# Patient Record
Sex: Female | Born: 1965 | Race: White | Hispanic: No | Marital: Married | Smoking: Current every day smoker
Health system: Southern US, Community
[De-identification: ages and names within clinical notes are randomized; demographics above are authoritative.]

## PROBLEM LIST (undated history)

## (undated) DIAGNOSIS — M1990 Unspecified osteoarthritis, unspecified site: Secondary | ICD-10-CM

## (undated) DIAGNOSIS — K219 Gastro-esophageal reflux disease without esophagitis: Secondary | ICD-10-CM

## (undated) DIAGNOSIS — E119 Type 2 diabetes mellitus without complications: Secondary | ICD-10-CM

## (undated) DIAGNOSIS — F419 Anxiety disorder, unspecified: Secondary | ICD-10-CM

## (undated) DIAGNOSIS — M542 Cervicalgia: Secondary | ICD-10-CM

## (undated) DIAGNOSIS — S060X9A Concussion with loss of consciousness of unspecified duration, initial encounter: Secondary | ICD-10-CM

## (undated) DIAGNOSIS — E785 Hyperlipidemia, unspecified: Secondary | ICD-10-CM

## (undated) DIAGNOSIS — D649 Anemia, unspecified: Secondary | ICD-10-CM

## (undated) DIAGNOSIS — E039 Hypothyroidism, unspecified: Secondary | ICD-10-CM

## (undated) DIAGNOSIS — R569 Unspecified convulsions: Secondary | ICD-10-CM

## (undated) DIAGNOSIS — T8852XA Failed moderate sedation during procedure, initial encounter: Secondary | ICD-10-CM

## (undated) DIAGNOSIS — I10 Essential (primary) hypertension: Secondary | ICD-10-CM

## (undated) DIAGNOSIS — S069XAA Unspecified intracranial injury with loss of consciousness status unknown, initial encounter: Secondary | ICD-10-CM

## (undated) DIAGNOSIS — F0781 Postconcussional syndrome: Secondary | ICD-10-CM

## (undated) DIAGNOSIS — F329 Major depressive disorder, single episode, unspecified: Secondary | ICD-10-CM

## (undated) HISTORY — DX: Type 2 diabetes mellitus without complications: E11.9

## (undated) HISTORY — DX: Hyperlipidemia, unspecified: E78.5

## (undated) HISTORY — PX: APPENDECTOMY: SHX54

## (undated) HISTORY — DX: Concussion with loss of consciousness of unspecified duration, initial encounter: S06.0X9A

## (undated) HISTORY — DX: Failed moderate sedation during procedure, initial encounter: T88.52XA

## (undated) HISTORY — PX: ABDOMINAL HYSTERECTOMY: SHX81

## (undated) HISTORY — PX: CERVICAL DISC SURGERY: SHX588

## (undated) HISTORY — PX: CHOLECYSTECTOMY: SHX55

## (undated) HISTORY — DX: Cervicalgia: M54.2

## (undated) HISTORY — PX: TUBAL LIGATION: SHX77

## (undated) HISTORY — DX: Postconcussional syndrome: F07.81

## (undated) HISTORY — PX: BACK SURGERY: SHX140

---
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
## 1988-11-10 HISTORY — PX: KNEE SURGERY: SHX244

## 1997-12-11 HISTORY — PX: TRACHEOSTOMY: SUR1362

## 1999-08-12 DIAGNOSIS — S060X9A Concussion with loss of consciousness of unspecified duration, initial encounter: Secondary | ICD-10-CM

## 1999-08-12 DIAGNOSIS — S060XAA Concussion with loss of consciousness status unknown, initial encounter: Secondary | ICD-10-CM

## 1999-08-12 HISTORY — DX: Concussion with loss of consciousness status unknown, initial encounter: S06.0XAA

## 1999-08-12 HISTORY — DX: Concussion with loss of consciousness of unspecified duration, initial encounter: S06.0X9A

## 2003-01-21 ENCOUNTER — Encounter: Payer: Self-pay | Admitting: Family Medicine

## 2003-01-21 ENCOUNTER — Ambulatory Visit (HOSPITAL_COMMUNITY): Admission: RE | Admit: 2003-01-21 | Discharge: 2003-01-21 | Payer: Self-pay | Admitting: Family Medicine

## 2004-12-22 ENCOUNTER — Ambulatory Visit (HOSPITAL_COMMUNITY): Admission: RE | Admit: 2004-12-22 | Discharge: 2004-12-22 | Payer: Self-pay | Admitting: Family Medicine

## 2004-12-27 ENCOUNTER — Ambulatory Visit (HOSPITAL_COMMUNITY): Admission: RE | Admit: 2004-12-27 | Discharge: 2004-12-27 | Payer: Self-pay | Admitting: Family Medicine

## 2005-07-12 ENCOUNTER — Ambulatory Visit (HOSPITAL_COMMUNITY): Admission: RE | Admit: 2005-07-12 | Discharge: 2005-07-12 | Payer: Self-pay | Admitting: Family Medicine

## 2005-07-19 ENCOUNTER — Encounter (HOSPITAL_COMMUNITY): Admission: RE | Admit: 2005-07-19 | Discharge: 2005-08-18 | Payer: Self-pay | Admitting: Family Medicine

## 2006-10-26 ENCOUNTER — Ambulatory Visit (HOSPITAL_COMMUNITY): Payer: Self-pay | Admitting: Psychology

## 2006-11-12 ENCOUNTER — Ambulatory Visit (HOSPITAL_COMMUNITY): Payer: Self-pay | Admitting: Psychology

## 2009-01-22 ENCOUNTER — Ambulatory Visit (HOSPITAL_COMMUNITY): Admission: RE | Admit: 2009-01-22 | Discharge: 2009-01-22 | Payer: Self-pay | Admitting: Family Medicine

## 2010-01-07 ENCOUNTER — Ambulatory Visit (HOSPITAL_COMMUNITY): Admission: RE | Admit: 2010-01-07 | Discharge: 2010-01-07 | Payer: Self-pay | Admitting: Otolaryngology

## 2010-09-10 HISTORY — PX: CERVIX SURGERY: SHX593

## 2010-12-15 ENCOUNTER — Ambulatory Visit: Admit: 2010-12-15 | Payer: Self-pay | Admitting: Internal Medicine

## 2011-01-01 ENCOUNTER — Encounter: Payer: Self-pay | Admitting: Physical Medicine and Rehabilitation

## 2011-02-09 HISTORY — PX: CARDIOVASCULAR STRESS TEST: SHX262

## 2011-04-25 ENCOUNTER — Ambulatory Visit (INDEPENDENT_AMBULATORY_CARE_PROVIDER_SITE_OTHER): Payer: Self-pay | Admitting: Internal Medicine

## 2012-02-07 ENCOUNTER — Other Ambulatory Visit: Payer: Self-pay | Admitting: Family Medicine

## 2012-02-07 ENCOUNTER — Ambulatory Visit (HOSPITAL_COMMUNITY)
Admission: RE | Admit: 2012-02-07 | Discharge: 2012-02-07 | Disposition: A | Discharge status: Home / Self Care | Payer: BC Managed Care – PPO | Source: Ambulatory Visit | Attending: Family Medicine | Admitting: Family Medicine

## 2012-02-07 ENCOUNTER — Ambulatory Visit (HOSPITAL_COMMUNITY): Payer: BC Managed Care – PPO

## 2012-02-07 DIAGNOSIS — R05 Cough: Secondary | ICD-10-CM

## 2012-02-07 DIAGNOSIS — R059 Cough, unspecified: Secondary | ICD-10-CM

## 2012-02-07 DIAGNOSIS — J3489 Other specified disorders of nose and nasal sinuses: Secondary | ICD-10-CM | POA: Insufficient documentation

## 2012-09-04 ENCOUNTER — Ambulatory Visit (HOSPITAL_COMMUNITY)
Admission: RE | Admit: 2012-09-04 | Discharge: 2012-09-04 | Disposition: A | Discharge status: Home / Self Care | Payer: BC Managed Care – PPO | Source: Ambulatory Visit | Attending: Family Medicine | Admitting: Family Medicine

## 2012-09-04 ENCOUNTER — Other Ambulatory Visit: Payer: Self-pay | Admitting: Family Medicine

## 2012-09-04 DIAGNOSIS — M47817 Spondylosis without myelopathy or radiculopathy, lumbosacral region: Secondary | ICD-10-CM | POA: Insufficient documentation

## 2012-09-04 DIAGNOSIS — M549 Dorsalgia, unspecified: Secondary | ICD-10-CM | POA: Insufficient documentation

## 2012-09-04 DIAGNOSIS — M4120 Other idiopathic scoliosis, site unspecified: Secondary | ICD-10-CM | POA: Insufficient documentation

## 2012-12-11 HISTORY — PX: CARDIAC CATHETERIZATION: SHX172

## 2012-12-13 ENCOUNTER — Other Ambulatory Visit: Payer: Self-pay | Admitting: Family Medicine

## 2012-12-13 DIAGNOSIS — R109 Unspecified abdominal pain: Secondary | ICD-10-CM

## 2012-12-17 ENCOUNTER — Ambulatory Visit (HOSPITAL_COMMUNITY): Payer: BC Managed Care – PPO | Attending: Family Medicine

## 2013-01-10 ENCOUNTER — Ambulatory Visit (HOSPITAL_COMMUNITY): Admission: RE | Admit: 2013-01-10 | Payer: BC Managed Care – PPO | Source: Ambulatory Visit

## 2013-02-05 ENCOUNTER — Other Ambulatory Visit (HOSPITAL_COMMUNITY): Payer: Self-pay | Admitting: Family Medicine

## 2013-02-05 DIAGNOSIS — R102 Pelvic and perineal pain: Secondary | ICD-10-CM

## 2013-02-05 DIAGNOSIS — R109 Unspecified abdominal pain: Secondary | ICD-10-CM

## 2013-02-14 ENCOUNTER — Ambulatory Visit (HOSPITAL_COMMUNITY): Payer: BC Managed Care – PPO

## 2013-02-14 ENCOUNTER — Ambulatory Visit (HOSPITAL_COMMUNITY): Payer: BC Managed Care – PPO | Attending: Family Medicine

## 2013-02-15 ENCOUNTER — Encounter: Payer: Self-pay | Admitting: *Deleted

## 2013-02-22 ENCOUNTER — Encounter: Payer: Self-pay | Admitting: *Deleted

## 2013-02-26 ENCOUNTER — Ambulatory Visit (INDEPENDENT_AMBULATORY_CARE_PROVIDER_SITE_OTHER): Payer: BC Managed Care – PPO | Admitting: Family Medicine

## 2013-02-26 ENCOUNTER — Encounter: Payer: Self-pay | Admitting: Family Medicine

## 2013-02-26 ENCOUNTER — Other Ambulatory Visit: Payer: Self-pay | Admitting: Family Medicine

## 2013-02-26 VITALS — BP 98/66 | HR 80 | Wt 147.0 lb

## 2013-02-26 DIAGNOSIS — E039 Hypothyroidism, unspecified: Secondary | ICD-10-CM | POA: Insufficient documentation

## 2013-02-26 DIAGNOSIS — M542 Cervicalgia: Secondary | ICD-10-CM

## 2013-02-26 DIAGNOSIS — G8929 Other chronic pain: Secondary | ICD-10-CM | POA: Insufficient documentation

## 2013-02-26 DIAGNOSIS — F411 Generalized anxiety disorder: Secondary | ICD-10-CM | POA: Insufficient documentation

## 2013-02-26 DIAGNOSIS — R109 Unspecified abdominal pain: Secondary | ICD-10-CM

## 2013-02-26 DIAGNOSIS — E559 Vitamin D deficiency, unspecified: Secondary | ICD-10-CM

## 2013-02-26 NOTE — Progress Notes (Signed)
Patient comes in today overall she is hanging in there she finds herself very frustrated she has a lot of neck pain and discomfort pain that radiates into the shoulder and into the arms to some degree she finds it very difficult for her to do any type ofactivity on a regular basis. In addition to this she finds any significant length of standing or sitting or squatting turning bending pulling or lifting causes significant troubles.she does see pain management specialist may work with her extensively on her pain. They recently put her on methadone. She also uses Xanax for significant anxiety issues. Currently she takes Xanax twice daily it does well for her she denies abusing it.  Past medical history she is disabled with significant cervical spine disease and problems with that. Chronic pain as well. Her condition hasn't changed she is incapable working currently.  Family history pertinent for hypertension diabetes heart disease she does smoke she's been counseled to quit.  On today's exam lungs are clear no crackles heart is regular pulses normal extremities no edema skin warm dry neurologic grossly normal neck no masses subjective discomfort throughout the spinal area. Strength overall fair in the legs difficult to assess strength in the left arm because of her neck pain.  Assessment and plan Unspecified hypothyroidism - Plan: TSH, T4, free, CANCELED: TSH + free T4  Unspecified vitamin D deficiency - Plan: Vitamin D 25 hydroxy  Abdominal pain, unspecified site - Plan: Hepatic function panel

## 2013-02-26 NOTE — Patient Instructions (Signed)
Continue your medicines. Recheck in 4 months Sooner if problems

## 2013-02-27 ENCOUNTER — Encounter: Payer: Self-pay | Admitting: *Deleted

## 2013-02-27 LAB — BASIC METABOLIC PANEL
Calcium: 9.5 mg/dL (ref 8.4–10.5)
Chloride: 100 mEq/L (ref 96–112)
Potassium: 4.3 mEq/L (ref 3.5–5.3)

## 2013-02-27 LAB — VITAMIN D 25 HYDROXY (VIT D DEFICIENCY, FRACTURES): Vit D, 25-Hydroxy: 23 ng/mL — ABNORMAL LOW (ref 30–89)

## 2013-02-27 LAB — CBC WITH DIFFERENTIAL/PLATELET
Basophils Absolute: 0.1 10*3/uL (ref 0.0–0.1)
Eosinophils Relative: 3 % (ref 0–5)
HCT: 42.7 % (ref 36.0–46.0)
Lymphs Abs: 5.9 10*3/uL — ABNORMAL HIGH (ref 0.7–4.0)
MCH: 32.1 pg (ref 26.0–34.0)
MCV: 90.9 fL (ref 78.0–100.0)
Monocytes Absolute: 0.6 10*3/uL (ref 0.1–1.0)
Neutro Abs: 6.1 10*3/uL (ref 1.7–7.7)
Neutrophils Relative %: 46 % (ref 43–77)
Platelets: 271 10*3/uL (ref 150–400)
RBC: 4.7 MIL/uL (ref 3.87–5.11)
RDW: 13.8 % (ref 11.5–15.5)

## 2013-02-27 LAB — SEDIMENTATION RATE: Sed Rate: 6 mm/hr (ref 0–22)

## 2013-02-27 LAB — HEPATIC FUNCTION PANEL
AST: 13 U/L (ref 0–37)
Albumin: 4.6 g/dL (ref 3.5–5.2)
Indirect Bilirubin: 0.3 mg/dL (ref 0.0–0.9)

## 2013-03-04 ENCOUNTER — Ambulatory Visit (HOSPITAL_COMMUNITY)
Admission: RE | Admit: 2013-03-04 | Discharge: 2013-03-04 | Disposition: A | Discharge status: Home / Self Care | Payer: BC Managed Care – PPO | Source: Ambulatory Visit | Attending: Family Medicine | Admitting: Family Medicine

## 2013-03-04 DIAGNOSIS — R109 Unspecified abdominal pain: Secondary | ICD-10-CM | POA: Insufficient documentation

## 2013-03-04 DIAGNOSIS — R102 Pelvic and perineal pain: Secondary | ICD-10-CM

## 2013-03-05 NOTE — Progress Notes (Signed)
Notified pt of [place] results

## 2013-03-26 ENCOUNTER — Ambulatory Visit (INDEPENDENT_AMBULATORY_CARE_PROVIDER_SITE_OTHER): Payer: BC Managed Care – PPO | Admitting: Family Medicine

## 2013-03-26 ENCOUNTER — Encounter: Payer: Self-pay | Admitting: Family Medicine

## 2013-03-26 ENCOUNTER — Telehealth: Payer: Self-pay | Admitting: Family Medicine

## 2013-03-26 VITALS — BP 110/74 | Temp 98.1°F | Wt 149.2 lb

## 2013-03-26 DIAGNOSIS — M5090 Cervical disc disorder, unspecified, unspecified cervical region: Secondary | ICD-10-CM

## 2013-03-26 DIAGNOSIS — M25551 Pain in right hip: Secondary | ICD-10-CM

## 2013-03-26 DIAGNOSIS — M25559 Pain in unspecified hip: Secondary | ICD-10-CM

## 2013-03-26 MED ORDER — ALPRAZOLAM 1 MG PO TABS: 1.0000 mg | ORAL_TABLET | Freq: Two times a day (BID) | ORAL | Status: DC

## 2013-03-26 NOTE — Patient Instructions (Addendum)
In June cal [place] for lab papers to check your thyroid and a follow up office visit.  Get xray of r hip at hospital
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
## 2013-03-26 NOTE — Telephone Encounter (Signed)
Patient needs a refill of her xanax to her new pharmacy at [facility] in [place]

## 2013-04-24 ENCOUNTER — Ambulatory Visit: Payer: BC Managed Care – PPO | Admitting: Family Medicine

## 2013-04-28 ENCOUNTER — Encounter: Payer: Self-pay | Admitting: Family Medicine

## 2013-04-28 ENCOUNTER — Ambulatory Visit (INDEPENDENT_AMBULATORY_CARE_PROVIDER_SITE_OTHER): Payer: BC Managed Care – PPO | Admitting: Family Medicine

## 2013-04-28 ENCOUNTER — Other Ambulatory Visit: Payer: Self-pay | Admitting: Cardiovascular Disease

## 2013-04-28 ENCOUNTER — Ambulatory Visit (HOSPITAL_COMMUNITY)
Admission: RE | Admit: 2013-04-28 | Discharge: 2013-04-28 | Disposition: A | Discharge status: Home / Self Care | Payer: BC Managed Care – PPO | Source: Ambulatory Visit | Attending: Family Medicine | Admitting: Family Medicine

## 2013-04-28 VITALS — BP 108/68 | Wt 145.0 lb

## 2013-04-28 DIAGNOSIS — M1610 Unilateral primary osteoarthritis, unspecified hip: Secondary | ICD-10-CM | POA: Insufficient documentation

## 2013-04-28 DIAGNOSIS — M169 Osteoarthritis of hip, unspecified: Secondary | ICD-10-CM | POA: Insufficient documentation

## 2013-04-28 DIAGNOSIS — G5601 Carpal tunnel syndrome, right upper limb: Secondary | ICD-10-CM

## 2013-04-28 DIAGNOSIS — M25551 Pain in right hip: Secondary | ICD-10-CM

## 2013-04-28 DIAGNOSIS — M25559 Pain in unspecified hip: Secondary | ICD-10-CM

## 2013-04-28 DIAGNOSIS — G5600 Carpal tunnel syndrome, unspecified upper limb: Secondary | ICD-10-CM

## 2013-04-28 MED ORDER — ALPRAZOLAM 1 MG PO TABS: 1.0000 mg | ORAL_TABLET | Freq: Two times a day (BID) | ORAL | Status: DC

## 2013-04-28 NOTE — Progress Notes (Signed)
  Subjective:    Patient ID: Melanie Shepard, female    DOB: 1966/10/31, 47 y.o.   MRN: [id number]  Wrist Pain  The pain is present in the right wrist. This is a chronic problem. The symptoms are aggravated by activity.  Pain in the wrist over the past several months hurts with certain movements in addition to this numbness tingling in the hand and feels like there is some weakness. Her neck pain is stable no change there is still causing severe pain in the neck limiting her ability to do activity or work Past medical history please see problem list previous notes Family history noncontributory    Review of Systems See above.    Objective:   Physical Exam Positive Tinel's. No atrophy noted. Pulses in the hands are normal. Reflexes are good. Lungs clear heart regular. Subjective discomfort in the neck.       Assessment & Plan:  #1 right wrist pain-patient would benefit from nerve conduction studies. Recommend followup if ongoing troubles. May need referral to a specialist.

## 2013-04-29 ENCOUNTER — Other Ambulatory Visit: Payer: Self-pay | Admitting: *Deleted

## 2013-04-29 DIAGNOSIS — M25531 Pain in right wrist: Secondary | ICD-10-CM

## 2013-04-29 LAB — COMPREHENSIVE METABOLIC PANEL
ALT: 43 U/L — ABNORMAL HIGH (ref 0–35)
Alkaline Phosphatase: 117 U/L (ref 39–117)
BUN: 7 mg/dL (ref 6–23)
CO2: 26 mEq/L (ref 19–32)
Glucose, Bld: 93 mg/dL (ref 70–99)
Sodium: 136 mEq/L (ref 135–145)
Total Protein: 7 g/dL (ref 6.0–8.3)

## 2013-04-29 LAB — T3, FREE: T3, Free: 2.9 pg/mL (ref 2.3–4.2)

## 2013-04-29 LAB — T4, FREE: Free T4: 1.02 ng/dL (ref 0.80–1.80)

## 2013-05-07 ENCOUNTER — Encounter: Payer: Self-pay | Admitting: Family Medicine

## 2013-05-07 ENCOUNTER — Ambulatory Visit (INDEPENDENT_AMBULATORY_CARE_PROVIDER_SITE_OTHER): Payer: BC Managed Care – PPO | Admitting: Family Medicine

## 2013-05-07 VITALS — BP 90/62 | Temp 98.5°F | Ht 67.5 in | Wt 144.0 lb

## 2013-05-07 DIAGNOSIS — Z733 Stress, not elsewhere classified: Secondary | ICD-10-CM

## 2013-05-07 DIAGNOSIS — F439 Reaction to severe stress, unspecified: Secondary | ICD-10-CM
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
## 2013-05-15 ENCOUNTER — Telehealth: Payer: Self-pay | Admitting: Cardiovascular Disease

## 2013-05-15 NOTE — Telephone Encounter (Signed)
Melanie Shepard needs a prescription written for Metoprolol 25mg  only had one refill on last month. Also she changed pharmacies . [facility] on [address] [phone]   Thanks

## 2013-05-16 NOTE — Telephone Encounter (Signed)
Message forwarded to [facility]. [place], LPN to verify if pt is on Lopressor or Toprol.XL and send in refill.

## 2013-05-20 ENCOUNTER — Ambulatory Visit (HOSPITAL_COMMUNITY): Payer: Self-pay | Admitting: Psychology

## 2013-05-22 ENCOUNTER — Encounter: Payer: BC Managed Care – PPO | Admitting: Neurology

## 2013-05-23 ENCOUNTER — Ambulatory Visit (HOSPITAL_COMMUNITY): Payer: Self-pay | Admitting: Psychology

## 2013-05-25 ENCOUNTER — Encounter (HOSPITAL_COMMUNITY): Payer: Self-pay | Admitting: *Deleted

## 2013-05-25 ENCOUNTER — Observation Stay (HOSPITAL_COMMUNITY): Payer: BC Managed Care – PPO

## 2013-05-25 ENCOUNTER — Emergency Department (HOSPITAL_COMMUNITY): Payer: BC Managed Care – PPO

## 2013-05-25 ENCOUNTER — Observation Stay (HOSPITAL_COMMUNITY)
Admission: EM | Admit: 2013-05-25 | Discharge: 2013-05-27 | Disposition: A | Discharge status: Home / Self Care | Payer: BC Managed Care – PPO | Attending: Internal Medicine | Admitting: Internal Medicine

## 2013-05-25 DIAGNOSIS — I3139 Other pericardial effusion (noninflammatory): Secondary | ICD-10-CM

## 2013-05-25 DIAGNOSIS — E876 Hypokalemia: Secondary | ICD-10-CM | POA: Insufficient documentation

## 2013-05-25 DIAGNOSIS — E039 Hypothyroidism, unspecified: Secondary | ICD-10-CM | POA: Diagnosis present

## 2013-05-25 DIAGNOSIS — I319 Disease of pericardium, unspecified: Secondary | ICD-10-CM

## 2013-05-25 DIAGNOSIS — R079 Chest pain, unspecified: Secondary | ICD-10-CM | POA: Diagnosis present

## 2013-05-25 DIAGNOSIS — R0789 Other chest pain: Principal | ICD-10-CM | POA: Diagnosis present

## 2013-05-25 DIAGNOSIS — I313 Pericardial effusion (noninflammatory): Secondary | ICD-10-CM

## 2013-05-25 DIAGNOSIS — M542 Cervicalgia: Secondary | ICD-10-CM | POA: Insufficient documentation

## 2013-05-25 DIAGNOSIS — J441 Chronic obstructive pulmonary disease with (acute) exacerbation: Secondary | ICD-10-CM

## 2013-05-25 DIAGNOSIS — I2510 Atherosclerotic heart disease of native coronary artery without angina pectoris: Secondary | ICD-10-CM

## 2013-05-25 DIAGNOSIS — F411 Generalized anxiety disorder: Secondary | ICD-10-CM | POA: Diagnosis present

## 2013-05-25 DIAGNOSIS — G8929 Other chronic pain: Secondary | ICD-10-CM | POA: Diagnosis present

## 2013-05-25 DIAGNOSIS — F17200 Nicotine dependence, unspecified, uncomplicated: Secondary | ICD-10-CM | POA: Diagnosis present

## 2013-05-25 DIAGNOSIS — J189 Pneumonia, unspecified organism: Secondary | ICD-10-CM

## 2013-05-25 DIAGNOSIS — G894 Chronic pain syndrome: Secondary | ICD-10-CM | POA: Insufficient documentation

## 2013-05-25 DIAGNOSIS — I309 Acute pericarditis, unspecified: Secondary | ICD-10-CM

## 2013-05-25 DIAGNOSIS — J439 Emphysema, unspecified: Secondary | ICD-10-CM | POA: Diagnosis present

## 2013-05-25 HISTORY — DX: Hypothyroidism, unspecified: E03.9

## 2013-05-25 HISTORY — DX: Unspecified osteoarthritis, unspecified site: M19.90

## 2013-05-25 HISTORY — DX: Anxiety disorder, unspecified: F41.9

## 2013-05-25 LAB — CBC
HCT: 43.1 % (ref 36.0–46.0)
HCT: 37.5 % (ref 36.0–46.0)
Hemoglobin: 15.1 g/dL — ABNORMAL HIGH (ref 12.0–15.0)
Hemoglobin: 13.2 g/dL (ref 12.0–15.0)
MCH: 32.1 pg (ref 26.0–34.0)
MCH: 32.7 pg (ref 26.0–34.0)
MCHC: 35.2 g/dL (ref 30.0–36.0)
MCV: 91.7 fL (ref 78.0–100.0)
Platelets: 242 10*3/uL (ref 150–400)
RBC: 4.7 MIL/uL (ref 3.87–5.11)
RDW: 13.7 % (ref 11.5–15.5)
WBC: 15.6 10*3/uL — ABNORMAL HIGH (ref 4.0–10.5)

## 2013-05-25 LAB — TROPONIN I
Troponin I: <0.3 ng/mL (ref <0.30)
Troponin I: <0.3 ng/mL (ref <0.30)
Troponin I: <0.3 ng/mL (ref <0.30)

## 2013-05-25 LAB — BASIC METABOLIC PANEL
BUN: 7 mg/dL (ref 6–23)
CO2: 22 mEq/L (ref 19–32)
Chloride: 99 mEq/L (ref 96–112)
Creatinine, Ser: 0.69 mg/dL (ref 0.50–1.10)
Creatinine, Ser: 0.67 mg/dL (ref 0.50–1.10)
GFR calc Af Amer: >90 mL/min (ref >90)
GFR calc non Af Amer: >90 mL/min (ref >90)
Glucose, Bld: 131 mg/dL — ABNORMAL HIGH (ref 70–99)
Glucose, Bld: 164 mg/dL — ABNORMAL HIGH (ref 70–99)
Potassium: 3.8 mEq/L (ref 3.5–5.1)

## 2013-05-25 LAB — POCT I-STAT TROPONIN I: Troponin i, poc: 0.14 ng/mL (ref 0.00–0.08)

## 2013-05-25 MED ORDER — ASPIRIN 81 MG PO CHEW
324.0000 mg | CHEWABLE_TABLET | Freq: Once | ORAL | Status: AC
  Administered 2013-05-25: 324 mg via ORAL
  Filled 2013-05-25: qty 4

## 2013-05-25 MED ORDER — HYDROMORPHONE HCL PF 1 MG/ML IJ SOLN
0.5000 mg | INTRAMUSCULAR | Status: DC | PRN
  Administered 2013-05-25 – 2013-05-27 (×6): 1 mg via INTRAVENOUS
  Filled 2013-05-25 (×7): qty 1

## 2013-05-25 MED ORDER — VITAMIN D3 125 MCG (5000 UT) PO CAPS: 1.0000 | ORAL_CAPSULE | Freq: Every day | ORAL | Status: DC

## 2013-05-25 MED ORDER — ALUM & MAG HYDROXIDE-SIMETH 200-200-20 MG/5ML PO SUSP: 30.0000 mL | Freq: Four times a day (QID) | ORAL | Status: DC | PRN

## 2013-05-25 MED ORDER — MORPHINE SULFATE 4 MG/ML IJ SOLN
4.0000 mg | Freq: Once | INTRAMUSCULAR | Status: AC
  Administered 2013-05-25: 4 mg via INTRAVENOUS
  Filled 2013-05-25: qty 1

## 2013-05-25 MED ORDER — POTASSIUM CHLORIDE CRYS ER 20 MEQ PO TBCR
20.0000 meq | EXTENDED_RELEASE_TABLET | Freq: Once | ORAL | Status: AC
  Administered 2013-05-25: 20 meq via ORAL
  Filled 2013-05-25: qty 1

## 2013-05-25 MED ORDER — IOHEXOL 350 MG/ML SOLN
100.0000 mL | Freq: Once | INTRAVENOUS | Status: AC | PRN
  Administered 2013-05-25: 100 mL via INTRAVENOUS

## 2013-05-25 MED ORDER — ONDANSETRON HCL 4 MG/2ML IJ SOLN: 4.0000 mg | Freq: Four times a day (QID) | INTRAMUSCULAR | Status: DC | PRN

## 2013-05-25 MED ORDER — METHADONE HCL 10 MG PO TABS
20.0000 mg | ORAL_TABLET | Freq: Three times a day (TID) | ORAL | Status: DC
  Filled 2013-05-25: qty 2

## 2013-05-25 MED ORDER — NITROGLYCERIN 0.4 MG SL SUBL
0.4000 mg | SUBLINGUAL_TABLET | SUBLINGUAL | Status: DC | PRN
  Administered 2013-05-25: 0.4 mg via SUBLINGUAL
  Filled 2013-05-25: qty 25

## 2013-05-25 MED ORDER — SODIUM CHLORIDE 0.9 % IV SOLN: 250.0000 mL | INTRAVENOUS | Status: DC | PRN

## 2013-05-25 MED ORDER — SODIUM CHLORIDE 0.9 % IJ SOLN
3.0000 mL | Freq: Two times a day (BID) | INTRAMUSCULAR | Status: DC
  Administered 2013-05-25 – 2013-05-26 (×3): 3 mL via INTRAVENOUS

## 2013-05-25 MED ORDER — ZOLPIDEM TARTRATE 5 MG PO TABS: 5.0000 mg | ORAL_TABLET | Freq: Every evening | ORAL | Status: DC | PRN

## 2013-05-25 MED ORDER — BACLOFEN 10 MG PO TABS
10.0000 mg | ORAL_TABLET | Freq: Three times a day (TID) | ORAL | Status: DC
  Administered 2013-05-25 – 2013-05-27 (×7): 10 mg via ORAL
  Filled 2013-05-25 (×10): qty 1

## 2013-05-25 MED ORDER — LEVOTHYROXINE SODIUM 100 MCG PO TABS
100.0000 ug | ORAL_TABLET | Freq: Every day | ORAL | Status: DC
  Administered 2013-05-25 – 2013-05-27 (×3): 100 ug via ORAL
  Filled 2013-05-25 (×5): qty 1

## 2013-05-25 MED ORDER — METOPROLOL SUCCINATE ER 25 MG PO TB24
25.0000 mg | ORAL_TABLET | Freq: Every day | ORAL | Status: DC
  Administered 2013-05-25 – 2013-05-26 (×2): 25 mg via ORAL
  Filled 2013-05-25 (×4): qty 1

## 2013-05-25 MED ORDER — VITAMIN D3 25 MCG (1000 UNIT) PO TABS
1000.0000 [IU] | ORAL_TABLET | Freq: Every day | ORAL | Status: DC
  Administered 2013-05-25 – 2013-05-27 (×3): 1000 [IU] via ORAL
  Filled 2013-05-25 (×4): qty 1

## 2013-05-25 MED ORDER — DIPHENHYDRAMINE HCL 50 MG/ML IJ SOLN
25.0000 mg | Freq: Four times a day (QID) | INTRAMUSCULAR | Status: DC | PRN
Start: 2013-05-25 — End: 2013-05-27
  Administered 2013-05-25: 25 mg via INTRAVENOUS
  Filled 2013-05-25: qty 1

## 2013-05-25 MED ORDER — ACETAMINOPHEN 650 MG RE SUPP: 650.0000 mg | Freq: Four times a day (QID) | RECTAL | Status: DC | PRN

## 2013-05-25 MED ORDER — OXYCODONE HCL 5 MG PO TABS
5.0000 mg | ORAL_TABLET | ORAL | Status: DC | PRN
  Administered 2013-05-25 – 2013-05-27 (×8): 5 mg via ORAL
  Filled 2013-05-25 (×9): qty 1

## 2013-05-25 MED ORDER — DEXTROSE 5 % IV SOLN
1.0000 g | Freq: Three times a day (TID) | INTRAVENOUS | Status: DC
  Administered 2013-05-25 – 2013-05-27 (×6): 1 g via INTRAVENOUS
  Filled 2013-05-25 (×8): qty 1

## 2013-05-25 MED ORDER — VANCOMYCIN HCL IN DEXTROSE 750-5 MG/150ML-% IV SOLN
750.0000 mg | Freq: Three times a day (TID) | INTRAVENOUS | Status: DC
  Administered 2013-05-25 – 2013-05-27 (×6): 750 mg via INTRAVENOUS
  Filled 2013-05-25 (×8): qty 150

## 2013-05-25 MED ORDER — ASPIRIN EC 325 MG PO TBEC
325.0000 mg | DELAYED_RELEASE_TABLET | Freq: Every day | ORAL | Status: DC
  Administered 2013-05-25 – 2013-05-27 (×3): 325 mg via ORAL
  Filled 2013-05-25 (×3): qty 1

## 2013-05-25 MED ORDER — ONDANSETRON HCL 4 MG/2ML IJ SOLN
4.0000 mg | Freq: Once | INTRAMUSCULAR | Status: AC
  Administered 2013-05-25: 4 mg via INTRAVENOUS
  Filled 2013-05-25: qty 2

## 2013-05-25 MED ORDER — SODIUM CHLORIDE 0.9 % IJ SOLN: 3.0000 mL | INTRAMUSCULAR | Status: DC | PRN

## 2013-05-25 MED ORDER — HYDROMORPHONE HCL PF 1 MG/ML IJ SOLN
1.0000 mg | Freq: Once | INTRAMUSCULAR | Status: AC
  Administered 2013-05-25: 1 mg via INTRAVENOUS
  Filled 2013-05-25: qty 1

## 2013-05-25 MED ORDER — ENOXAPARIN SODIUM 40 MG/0.4ML ~~LOC~~ SOLN
40.0000 mg | SUBCUTANEOUS | Status: DC
  Administered 2013-05-25 – 2013-05-27 (×3): 40 mg via SUBCUTANEOUS
  Filled 2013-05-25 (×4): qty 0.4

## 2013-05-25 MED ORDER — ACETAMINOPHEN 325 MG PO TABS
650.0000 mg | ORAL_TABLET | Freq: Four times a day (QID) | ORAL | Status: DC | PRN
  Administered 2013-05-26 (×3): 650 mg via ORAL
  Filled 2013-05-25 (×3): qty 2

## 2013-05-25 MED ORDER — KETOROLAC TROMETHAMINE 30 MG/ML IJ SOLN
30.0000 mg | Freq: Four times a day (QID) | INTRAMUSCULAR | Status: AC | PRN
  Administered 2013-05-25 – 2013-05-26 (×4): 30 mg via INTRAVENOUS
  Filled 2013-05-25 (×4): qty 1

## 2013-05-25 MED ORDER — ALPRAZOLAM 0.5 MG PO TABS
1.0000 mg | ORAL_TABLET | Freq: Two times a day (BID) | ORAL | Status: DC
  Administered 2013-05-25 – 2013-05-27 (×5): 1 mg via ORAL
  Filled 2013-05-25 (×5): qty 2

## 2013-05-25 MED ORDER — ONDANSETRON HCL 4 MG PO TABS
4.0000 mg | ORAL_TABLET | Freq: Four times a day (QID) | ORAL | Status: DC | PRN
  Administered 2013-05-27: 4 mg via ORAL
  Filled 2013-05-25: qty 1

## 2013-05-25 NOTE — ED Notes (Signed)
Admitting at bedside 

## 2013-05-25 NOTE — ED Notes (Signed)
When RN entered room to place pt on portable tele monitor, RN noticed pt's upper lip was very swollen. Pt's airway is intact. Respirations are equal and unlabored. EDP notified as well as Admitting MD.

## 2013-05-25 NOTE — Progress Notes (Addendum)
[facility] PROGRESS NOTE  Melanie Shepard ZOX:[id number] DOB: Jul 11, 1966 DOA: 05/25/2013 PCP: Lilyan Punt, MD  I have seen and examined pt who is a 47 y.o. female admitted this am by Dr Lovell Sheehan with CP-noted she was recently admitted to [facility] for same and began having CP again so came to [facility] ED. She describes the CP as severe midsternal , and worsened by inspiration. She denies cough now but states she was coughing a few days ago when she was admitted to hospital in [place]. CT angio neg for PE but shows bibasilar consolidation/atx with small adjacent effusions, right greater than left and  WBC 16. Will start on empiric abx for HCAP, I have consulted cards for further recs- Dr Young Berry is her cardiolgist. Will o/w continue current management plan as per Dr Lovell Sheehan and follow.     Melanie Shepard  [facility] Pager [phone]. If 7PM-7AM, please contact night-coverage at [email or web address], password [facility] 05/25/2013, 11:19 AM  LOS: 0 days

## 2013-05-25 NOTE — ED Provider Notes (Signed)
History     CSN: [id number]  Arrival date & time 05/25/13  0005   First MD Initiated Contact with Patient 05/25/13 0142      Chief Complaint  Patient presents with  . Chest Pain    (Consider location/radiation/quality/duration/timing/severity/associated sxs/prior treatment) HPI Hx per PT - substernal CP x 4 days, hurst to breath, sharp in quality, was admitted to the hospital in [place], [place] and told that she needs a stress test and to f/u with her doctor on Monday.  She presents here tonight for severe pain, still hurts to breath. Pain severe. No SOB, no diaphoresis, is a smoker. No trauma, no radiation of pain, no leg pain or swelling Past Medical History  Diagnosis Date  . Post concussion syndrome 1999  . 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 Cervical pain (neck)     chronic    Past Surgical History  Procedure Laterality Date  . Cervix surgery  10/11  . Cardiovascular stress test  03/12    normal  . Tubal ligation Bilateral   . Knee surgery Left 12/89    No family history on file.  History  Substance Use Topics  . Smoking status: Current Every Day Smoker -- 1.00 packs/day for 30 years    Types: Cigarettes  . Smokeless tobacco: Not on file  . Alcohol Use: Not on file    OB History   Grav Para Term Preterm Abortions TAB SAB Ect Mult Living                  Review of Systems  Constitutional: Negative for fever and chills.  HENT: Negative for neck pain and neck stiffness.   Eyes: Negative for pain.  Respiratory: Negative for shortness of breath.   Cardiovascular: Positive for chest pain.  Gastrointestinal: Negative for abdominal pain.  Genitourinary: Negative for dysuria.  Musculoskeletal: Negative for back pain.  Skin: Negative for rash.  Neurological: Negative for headaches.  All other systems reviewed and are negative.    Allergies  Cymbalta  Home Medications   Current Outpatient Rx  Name  Route  Sig  Dispense  Refill  . ALPRAZolam (XANAX) 1  MG tablet   Oral   Take 1 tablet (1 mg total) by mouth 2 (two) times daily.   60 tablet   3   . baclofen (LIORESAL) 10 MG tablet   Oral   Take 10 mg by mouth 3 (three) times daily.         Marland Kitchen levothyroxine (SYNTHROID, LEVOTHROID) 100 MCG tablet   Oral   Take 100 mcg by mouth daily.         . methadone (DOLOPHINE) 10 MG tablet   Oral   Take 10 mg by mouth. 2 tablets 3 times  day         . metoprolol succinate (TOPROL-XL) 25 MG 24 hr tablet   Oral   Take 25 mg by mouth daily.           BP 117/69  Pulse 73  Temp(Src) 99.2 F (37.3 C)  SpO2 95%  Physical Exam  Constitutional: She is oriented to person, place, and time. She appears well-developed and well-nourished.  HENT:  Head: Normocephalic and atraumatic.  Eyes: Conjunctivae and EOM are normal. Pupils are equal, round, and reactive to light.  Neck: Trachea normal. Neck supple. No thyromegaly present.  Cardiovascular: Normal rate, regular rhythm, S1 normal, S2 normal and normal pulses.     No systolic murmur is present  No diastolic murmur is present  Pulses:      Radial pulses are 2+ on the right side, and 2+ on the left side.  Pulmonary/Chest: Effort normal and breath sounds normal. She has no wheezes. She has no rhonchi. She has no rales. She exhibits no tenderness.  Abdominal: Soft. Normal appearance and bowel sounds are normal. There is no tenderness. There is no CVA tenderness and negative Murphy's sign.  Musculoskeletal:  BLE:s Calves nontender, no cords or erythema, negative Homans sign  Neurological: She is alert and oriented to person, place, and time. She has normal strength. No cranial nerve deficit or sensory deficit. GCS eye subscore is 4. GCS verbal subscore is 5. GCS motor subscore is 6.  Skin: Skin is warm and dry. No rash noted. She is not diaphoretic.  Psychiatric: Her speech is normal.  Cooperative and appropriate    ED Course  Procedures (including critical care time)  Results for  orders placed during the hospital encounter of 05/25/13  CBC      Result Value Range   WBC 15.6 (*) 4.0 - 10.5 K/uL   RBC 4.70  3.87 - 5.11 MIL/uL   Hemoglobin 15.1 (*) 12.0 - 15.0 g/dL   HCT 16.1  09.6 - 04.5 %   MCV 91.7  78.0 - 100.0 fL   MCH 32.1  26.0 - 34.0 pg   MCHC 35.0  30.0 - 36.0 g/dL   RDW 40.9  81.1 - 91.4 %   Platelets 242  150 - 400 K/uL  BASIC METABOLIC PANEL      Result Value Range   Sodium 135  135 - 145 mEq/L   Potassium 3.4 (*) 3.5 - 5.1 mEq/L   Chloride 99  96 - 112 mEq/L   CO2 22  19 - 32 mEq/L   Glucose, Bld 131 (*) 70 - 99 mg/dL   BUN 8  6 - 23 mg/dL   Creatinine, Ser 7.82  0.50 - 1.10 mg/dL   Calcium 9.7  8.4 - 95.6 mg/dL   GFR calc non Af Amer >90  >90 mL/min   GFR calc Af Amer >90  >90 mL/min  TROPONIN I      Result Value Range   Troponin I <0.30  <0.30 ng/mL  POCT I-STAT TROPONIN I      Result Value Range   Troponin i, poc 0.14 (*) 0.00 - 0.08 ng/mL   Comment NOTIFIED PHYSICIAN     Comment 3            Dg Chest 2 View  05/25/2013   *RADIOLOGY REPORT*  Clinical Data: Chest pain.  CHEST - 2 VIEW  Comparison: 02/07/2012.  Findings: Significantly decreased depth of inspiration.  No gross change in a normal sized heart.  Interval patchy and linear density at both lung bases and crowding of the pulmonary vasculature and interstitial markings.  No gross change in mild prominence of the interstitial markings.  Stable cervical spine fixation hardware. Cholecystectomy clips.  Stable mild levoconvex scoliosis.  Interval small bilateral pleural effusions.  IMPRESSION:  1.  Significantly decreased inspiration with bibasilar atelectasis and possible pneumonia. 2.  Interval small bilateral pleural effusions. 3.  Grossly stable mild chronic interstitial lung disease.   Original Report Authenticated By: Beckie Salts, M.D.   Dg Hip Complete Right  04/28/2013   *RADIOLOGY REPORT*  Clinical Data: Hip pain  RIGHT HIP - COMPLETE 2+ VIEW  Comparison: None  Findings: The right  hip is located.  There is no  fracture or subluxation identified.  No fracture or subluxation is identified.  Mild osteoarthritis is noted with marginal spur formation identified.  IMPRESSION:  1.  No acute findings. 2.  Osteoarthritis.   Original Report Authenticated By: Signa Kell, M.D.    IV morphine, ASA, istat troponin elevated, serum troponin I WNL, plan admit   Date: 05/25/2013  Rate: 71  Rhythm: normal sinus rhythm  QRS Axis: normal  Intervals: normal  ST/T Wave abnormalities: nonspecific ST changes  Conduction Disutrbances:none  Narrative Interpretation: NSR with baseline wander  Old EKG Reviewed: none available  CAR consult, Dr Lovell Sheehan to admit for [facility]  MDM  CP followed by [facility] for h/o tachycardia and stress test ion the past and scheduled for stress test in 2 days with DR Alanda Amass  ECG, CXR, labs, PE study for recurrent pain  IV narcotics pain control  Admit        Sunnie Nielsen, MD 05/25/13 [phone]

## 2013-05-25 NOTE — ED Notes (Signed)
05/22/13: admitted for CP at [facility].  Told to have stress test on Monday. Drs. Assumed maybe infection around lungs.  Was hurting with inspiration and now constant.  bp was low sbp 70's. Told to stay off methadone but took it today. Chronic shoulder and neck pain.

## 2013-05-25 NOTE — Consult Note (Signed)
Reason for Consult: Pleuritic chest pain  Requesting Physician: Claybon Jabs  HPI: This is a 47 y.o. female with a past medical history significant for chronic neck, back pain after C-spine surgery (?2011).  She has had a Myoview in the past which was negative. Thursday prior to this admission she developed SSCP sharp and pleuritic. She went to [facility] and was admiitd for 24hrs. The pt says she became diaphoretic at one point and had "low B/P". She was discharged and was to see Dr Alanda Amass Monday. Last night she recurrent pleuritic chest pain and became diaphoretic. She denies any fever or chills. 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   PMHx:  Past Medical History  Diagnosis Date  . Post concussion syndrome 1999  . 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 Cervical pain (neck)     chronic  . Hypothyroidism   . Anxiety   . Arthritis     right hip   Past Surgical History  Procedure Laterality Date  . Cervix surgery  10/11  . Cardiovascular stress test  03/12    normal  . Tubal ligation Bilateral   . Knee surgery Left 12/89  . Back surgery      cervical fusion    FAMHx: Family History  Problem Relation Age of Onset  . Coronary artery disease Mother 14    MI     SOCHx:  reports that she has been smoking Cigarettes.  She has a 30 pack-year smoking history. She does not have any smokeless tobacco history on file. Her alcohol and drug histories are not on file.  ALLERGIES: Allergies  Allergen Reactions  . Cymbalta (Duloxetine Hcl) Other (See Comments)    Makes my head do crazy things    ROS: Pertinent items are noted in HPI. No history of GI bleeding, no recent fever or chills, no productive cough, no hemoptysis.  HOME MEDICATIONS: Prescriptions prior to admission  Medication Sig Dispense Refill  . ALPRAZolam (XANAX) 1 MG tablet Take 1 tablet (1 mg total) by mouth 2 (two)  times daily.  60 tablet  3  . baclofen (LIORESAL) 10 MG tablet Take 10 mg by mouth 3 (three) times daily.      . Cholecalciferol (VITAMIN D3) 5000 UNITS CAPS Take 1 tablet by mouth daily.      Marland Kitchen LACTOBACILLUS PO Take 1 tablet by mouth daily. 1 billion cfu      . levothyroxine (SYNTHROID, LEVOTHROID) 100 MCG tablet Take 100 mcg by mouth daily.      . methadone (DOLOPHINE) 10 MG tablet Take 20 mg by mouth every 8 (eight) hours. 2 tablets 3 times  day      . metoprolol succinate (TOPROL-XL) 25 MG 24 hr tablet Take 25 mg by mouth daily.        HOSPITAL MEDICATIONS: I have reviewed the patient's current medications.  VITALS: Blood pressure 102/59, pulse 85, temperature 98.7 F (37.1 C), temperature source Oral, resp. rate 18, height 5\' 7"  (1.702 m), weight 66.633 kg (146 lb 14.4 oz), SpO2 92.00%.  PHYSICAL EXAM: General appearance: alert, cooperative, appears older than stated age and mild distress Neck: no carotid bruit and no JVD Lungs: clear to auscultation bilaterally Heart: regular rate and rhythm Abdomen: soft, non-tender; bowel sounds normal; no masses,  no organomegaly Extremities: extremities normal, atraumatic, no cyanosis or edema Pulses: 2+ and symmetric Skin: Skin color, texture, turgor normal. No  rashes or lesions Neurologic: Grossly normal  LABS: Results for orders placed during the hospital encounter of 05/25/13 (from the past 48 hour(s))  CBC     Status: Abnormal   Collection Time    05/25/13  1:07 AM      Result Value Range   WBC 15.6 (*) 4.0 - 10.5 K/uL   RBC 4.70  3.87 - 5.11 MIL/uL   Hemoglobin 15.1 (*) 12.0 - 15.0 g/dL   HCT 40.9  81.1 - 91.4 %   MCV 91.7  78.0 - 100.0 fL   MCH 32.1  26.0 - 34.0 pg   MCHC 35.0  30.0 - 36.0 g/dL   RDW 78.2  95.6 - 21.3 %   Platelets 242  150 - 400 K/uL  BASIC METABOLIC PANEL     Status: Abnormal   Collection Time    05/25/13  1:07 AM      Result Value Range   Sodium 135  135 - 145 mEq/L   Potassium 3.4 (*) 3.5 - 5.1  mEq/L   Chloride 99  96 - 112 mEq/L   CO2 22  19 - 32 mEq/L   Glucose, Bld 131 (*) 70 - 99 mg/dL   BUN 8  6 - 23 mg/dL   Creatinine, Ser 0.86  0.50 - 1.10 mg/dL   Calcium 9.7  8.4 - 57.8 mg/dL   GFR calc non Af Amer >90  >90 mL/min   GFR calc Af Amer >90  >90 mL/min   Comment:            The eGFR has been calculated     using the CKD EPI equation.     This calculation has not been     validated in all clinical     situations.     eGFR's persistently     <90 mL/min signify     possible Chronic Kidney Disease.  POCT I-STAT TROPONIN I     Status: Abnormal   Collection Time    05/25/13  1:28 AM      Result Value Range   Troponin i, poc 0.14 (*) 0.00 - 0.08 ng/mL   Comment NOTIFIED PHYSICIAN     Comment 3            Comment: Due to the release kinetics of cTnI,     a negative result within the first hours     of the onset of symptoms does not rule out     myocardial infarction with certainty.     If myocardial infarction is still suspected,     repeat the test at appropriate intervals.  TROPONIN I     Status: None   Collection Time    05/25/13  1:55 AM      Result Value Range   Troponin I <0.30  <0.30 ng/mL   Comment:            Due to the release kinetics of cTnI,     a negative result within the first hours     of the onset of symptoms does not rule out     myocardial infarction with certainty.     If myocardial infarction is still suspected,     repeat the test at appropriate intervals.  TROPONIN I     Status: None   Collection Time    05/25/13  3:12 AM      Result Value Range   Troponin I <0.30  <0.30 ng/mL   Comment:  Due to the release kinetics of cTnI,     a negative result within the first hours     of the onset of symptoms does not rule out     myocardial infarction with certainty.     If myocardial infarction is still suspected,     repeat the test at appropriate intervals.  BASIC METABOLIC PANEL     Status: Abnormal   Collection Time     05/25/13  5:15 AM      Result Value Range   Sodium 133 (*) 135 - 145 mEq/L   Potassium 3.8  3.5 - 5.1 mEq/L   Chloride 99  96 - 112 mEq/L   CO2 22  19 - 32 mEq/L   Glucose, Bld 164 (*) 70 - 99 mg/dL   BUN 7  6 - 23 mg/dL   Creatinine, Ser 1.47  0.50 - 1.10 mg/dL   Calcium 9.3  8.4 - 82.9 mg/dL   GFR calc non Af Amer >90  >90 mL/min   GFR calc Af Amer >90  >90 mL/min   Comment:            The eGFR has been calculated     using the CKD EPI equation.     This calculation has not been     validated in all clinical     situations.     eGFR's persistently     <90 mL/min signify     possible Chronic Kidney Disease.  CBC     Status: Abnormal   Collection Time    05/25/13  5:15 AM      Result Value Range   WBC 16.4 (*) 4.0 - 10.5 K/uL   RBC 4.04  3.87 - 5.11 MIL/uL   Hemoglobin 13.2  12.0 - 15.0 g/dL   HCT 56.2  13.0 - 86.5 %   MCV 92.8  78.0 - 100.0 fL   MCH 32.7  26.0 - 34.0 pg   MCHC 35.2  30.0 - 36.0 g/dL   RDW 78.4  69.6 - 29.5 %   Platelets 251  150 - 400 K/uL  TROPONIN I     Status: None   Collection Time    05/25/13  8:00 AM      Result Value Range   Troponin I <0.30  <0.30 ng/mL   Comment:            Due to the release kinetics of cTnI,     a negative result within the first hours     of the onset of symptoms does not rule out     myocardial infarction with certainty.     If myocardial infarction is still suspected,     repeat the test at appropriate intervals.    EKG: NSR without acute changes  IMAGING: Dg Chest 2 View  05/25/2013   *RADIOLOGY REPORT*  Clinical Data: Chest pain.  CHEST - 2 VIEW  Comparison: 02/07/2012.  Findings: Significantly decreased depth of inspiration.  No gross change in a normal sized heart.  Interval patchy and linear density at both lung bases and crowding of the pulmonary vasculature and interstitial markings.  No gross change in mild prominence of the interstitial markings.  Stable cervical spine fixation hardware. Cholecystectomy  clips.  Stable mild levoconvex scoliosis.  Interval small bilateral pleural effusions.  IMPRESSION:  1.  Significantly decreased inspiration with bibasilar atelectasis and possible pneumonia. 2.  Interval small bilateral pleural effusions. 3.  Grossly stable mild chronic interstitial lung  disease.   Original Report Authenticated By: Beckie Salts, M.D.   Ct Angio Chest Pe W/cm &/or Wo Cm  05/25/2013   *RADIOLOGY REPORT*  Clinical Data: Chest pain, hypotensive  CT ANGIOGRAPHY CHEST  Technique:  Multidetector CT imaging of the chest using the standard protocol during bolus administration of intravenous contrast. Multiplanar reconstructed images including MIPs were obtained and reviewed to evaluate the vascular anatomy.  Contrast: OMNIPAQUE IOHEXOL 350 MG/ML SOLN  Comparison: None.  Findings: There is good contrast opacification of the pulmonary artery branches.  No discrete filling defect to suggest acute PE.Incomplete opacification of the thoracic aorta with no suggestion of dissection, aneurysm, or stenosis.  Small pericardial effusion.  Small pleural effusions, right greater than left.  Sub centimeter prevascular and pretracheal lymph nodes.  There is a single 11 mm enlarged precarinal lymph node.  Borderline right hilar adenopathy.  Cervical fixation hardware noted.  There is patchy atelectasis/consolidation in the posterior aspect of both lower lobes, more extensive right than left.  Spurring in the mid thoracic spine.  Sternum intact.  IMPRESSION: 1.  Negative for acute PE or thoracic aortic dissection. 2.  Small pericardial effusion. 3.  Bibasilar consolidation / atelectasis with small adjacent effusions, right greater than left. 4.  Nonspecific mild right hilar and precarinal adenopathy.   Original Report Authenticated By: D. 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 She will need a Myoview at some point.  Time Spent Directly with Patient: 40 minutes  Abelino Derrick [phone] beeper 05/25/2013, 11:03 AM    I have seen and examined the patient along with KILROY,LUKE K PAC.  I have reviewed the chart, notes and new data.  I agree with PA's note.  Key new complaints: the pain sounds non anginal, probably pleuritic.  Key examination changes: no clinical signs of CHF, no arrhythmia, no rub Key new findings / data: there is some calcification in the proximal LAD on CT angiography, but it is not particularly severe; a small pericardial effusion and lung infiltrates are noted and most likely relate to her pleurisy    PLAN: Check echo for probable pericarditis. Note elevated WBC and pulmonary infiltrates, but no fever. May consider antibiotics. At some point a repeat functional study for coronary insufficiency is appropriate, but this is not urgent.  Thurmon Fair, MD, [facility] [phone] 05/25/2013, 11:09 AM

## 2013-05-25 NOTE — H&P (Signed)
[facility] History and Physical  Melanie Shepard ZOX:[id number] DOB: 21-Nov-1966 DOA: 05/25/2013  Referring physician:  EDP PCP: Lilyan Punt, MD  Specialists:   Chief Complaint:  Chest Pain  HPI: Shaneen Roland-Maahs is a 47 y.o. female who presents to the Ed with complaints of return of chest pain over the past 24 hours.  She describes having sharp shooting chest pain without radiation and was associated with SOB, and nausea but no vomting or diaphoresis.   She was had severe chest pain 4 days ago and was admitted to the [facility] and was ruled out for ACS at that time per her report.  Her cardiologist is Dr. Alanda Amass, and she reports having a negative stress test in the past.      Review of Systems: The patient denies anorexia, fever, chills, headaches, weight loss, vision loss, diplopia, dizziness, decreased hearing, rhinitis, hoarseness, chest pain, syncope, dyspnea on exertion, peripheral edema, balance deficits, cough, hemoptysis, abdominal pain, nausea, vomiting, diarrhea, constipation, hematemesis, melena, hematochezia, severe indigestion/heartburn, dysuria, hematuria, incontinence, muscle weakness, suspicious skin lesions, transient blindness, difficulty walking, depression, unusual weight change, abnormal bleeding, enlarged lymph nodes, angioedema, and breast masses.    Past Medical History  Diagnosis Date  . Post concussion syndrome 1999  . 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 Cervical pain (neck)     chronic    Past Surgical History  Procedure Laterality Date  . Cervix surgery  10/11  . Cardiovascular stress test  03/12    normal  . Tubal ligation Bilateral   . Knee surgery Left 12/89    Prior to Admission medications   Medication Sig Start Date End Date Taking? Authorizing Provider  ALPRAZolam Prudy Feeler) 1 MG tablet Take 1 tablet (1 mg total) by mouth 2 (two) times daily. 04/28/13  Yes Babs Sciara, MD  baclofen (LIORESAL) 10 MG tablet Take  10 mg by mouth 3 (three) times daily.   Yes Historical Provider, MD  Cholecalciferol (VITAMIN D3) 5000 UNITS CAPS Take 1 tablet by mouth daily.   Yes Historical Provider, MD  LACTOBACILLUS PO Take 1 tablet by mouth daily. 1 billion cfu   Yes Historical Provider, MD  levothyroxine (SYNTHROID, LEVOTHROID) 100 MCG tablet Take 100 mcg by mouth daily. 02/27/13  Yes Historical Provider, MD  methadone (DOLOPHINE) 10 MG tablet Take 20 mg by mouth every 8 (eight) hours. 2 tablets 3 times  day   Yes Historical Provider, MD  metoprolol succinate (TOPROL-XL) 25 MG 24 hr tablet Take 25 mg by mouth daily.    Historical Provider, MD    Allergies  Allergen Reactions  . Cymbalta (Duloxetine Hcl) Other (See Comments)    Makes my head do crazy things    Social History:  Married  reports that she has been smoking Cigarettes.  She has a 30 pack-year smoking history. She does not have any smokeless tobacco history on file. Her alcohol and drug histories are not on file.     Family History:          CAD in Mother and Maternal Grandmother        HTN in Mother and Maternal Grandmother  DM in Father   Physical Exam:  GEN:  Pleasant well nourished and well developed 47 y.o. Caucasian female  examined  and in no acute distress; cooperative with exam Filed Vitals:   05/25/13 0227 05/25/13 0230 05/25/13 0328 05/25/13 0411  BP: 111/63 111/63 95/61 96/62   Pulse: 79 82 68 76  Temp: 98.9 F (37.2  C)     TempSrc: Oral     Resp:  22 12 16   SpO2: 93% 92% 92% 90%   Blood pressure 96/62, pulse 76, temperature 98.9 F (37.2 C), temperature source Oral, resp. rate 16, SpO2 90.00%. PSYCH: She is alert and oriented x4; does not appear anxious does not appear depressed; affect is normal HEENT: Normocephalic and Atraumatic, Mucous membranes pink; PERRLA; EOM intact; Fundi:  Benign;  No scleral icterus, Nares: Patent, Oropharynx: Clear, Fair Dentition, Neck:  FROM, no cervical lymphadenopathy nor thyromegaly or carotid  bruit; no JVD; Breasts:: Not examined CHEST WALL: No tenderness CHEST: Normal respiration, clear to auscultation bilaterally HEART: Regular rate and rhythm; no murmurs rubs or gallops BACK: No kyphosis or scoliosis; no CVA tenderness ABDOMEN: Positive Bowel Sounds,  soft non-tender; no masses, no organomegaly. Rectal Exam: Not done EXTREMITIES: No cyanosis, clubbing or edema; no ulcerations. Genitalia: not examined PULSES: 2+ and symmetric SKIN: Normal hydration no rash or ulceration CNS: Cranial nerves 2-12 grossly intact no focal neurologic deficit    Labs on Admission:  Basic Metabolic Panel:  Recent Labs Lab 05/25/13 0107  NA 135  K 3.4*  CL 99  CO2 22  GLUCOSE 131*  BUN 8  CREATININE 0.69  CALCIUM 9.7   Liver Function Tests: No results found for this basename: AST, ALT, ALKPHOS, BILITOT, PROT, ALBUMIN,  in the last 168 hours No results found for this basename: LIPASE, AMYLASE,  in the last 168 hours No results found for this basename: AMMONIA,  in the last 168 hours CBC:  Recent Labs Lab 05/25/13 0107  WBC 15.6*  HGB 15.1*  HCT 43.1  MCV 91.7  PLT 242   Cardiac Enzymes:  Recent Labs Lab 05/25/13 0155 05/25/13 0312  TROPONINI <0.30 <0.30    BNP (last 3 results) No results found for this basename: PROBNP,  in the last 8760 hours CBG: No results found for this basename: GLUCAP,  in the last 168 hours  Radiological Exams on Admission: Dg Chest 2 View  05/25/2013   *RADIOLOGY REPORT*  Clinical Data: Chest pain.  CHEST - 2 VIEW  Comparison: 02/07/2012.  Findings: Significantly decreased depth of inspiration.  No gross change in a normal sized heart.  Interval patchy and linear density at both lung bases and crowding of the pulmonary vasculature and interstitial markings.  No gross change in mild prominence of the interstitial markings.  Stable cervical spine fixation hardware. Cholecystectomy clips.  Stable mild levoconvex scoliosis.  Interval small  bilateral pleural effusions.  IMPRESSION:  1.  Significantly decreased inspiration with bibasilar atelectasis and possible pneumonia. 2.  Interval small bilateral pleural effusions. 3.  Grossly stable mild chronic interstitial lung disease.   Original Report Authenticated By: Beckie Salts, M.D.   Ct Angio Chest Pe W/cm &/or Wo Cm  05/25/2013   *RADIOLOGY REPORT*  Clinical Data: Chest pain, hypotensive  CT ANGIOGRAPHY CHEST  Technique:  Multidetector CT imaging of the chest using the standard protocol during bolus administration of intravenous contrast. Multiplanar reconstructed images including MIPs were obtained and reviewed to evaluate the vascular anatomy.  Contrast: OMNIPAQUE IOHEXOL 350 MG/ML SOLN  Comparison: None.  Findings: There is good contrast opacification of the pulmonary artery branches.  No discrete filling defect to suggest acute PE.Incomplete opacification of the thoracic aorta with no suggestion of dissection, aneurysm, or stenosis.  Small pericardial effusion.  Small pleural effusions, right greater than left.  Sub centimeter prevascular and pretracheal lymph nodes.  There is a single  11 mm enlarged precarinal lymph node.  Borderline right hilar adenopathy.  Cervical fixation hardware noted.  There is patchy atelectasis/consolidation in the posterior aspect of both lower lobes, more extensive right than left.  Spurring in the mid thoracic spine.  Sternum intact.  IMPRESSION: 1.  Negative for acute PE or thoracic aortic dissection. 2.  Small pericardial effusion. 3.  Bibasilar consolidation / atelectasis with small adjacent effusions, right greater than left. 4.  Nonspecific mild right hilar and precarinal adenopathy.   Original Report Authenticated By: D. Andria Rhein, MD     EKG: Independently reviewed. Normal sinus Rhythm without acute S-T changes    Assessment/Plan: Present on Admission:  . Chest pain . Chronic cervical pain . Unspecified hypothyroidism    Mild Hypokalemia .  Generalized anxiety disorder   1.   Chest Pain- Telelemetry Monitoring, Cycle Troponins, Nitropaste , O2 and ASA Rx.  CTA of Chest negative for PE, but reveals Atelectasis and adenopathy of Right Hilum.   May need Pulmonary Evaluation.     2.   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    3.   Mild Hypokalemia- Replete, and check magnesium.     4.  Hypothyroidism-  Continue   And check TSH level.    5.  Generalized Anxiety Disorder-  Continue Anxiolytic Rx with Alprazolam.        Code Status:   FULL CODE Family Communication:  Husband and Mother and Father at Bedside Disposition Plan:    Return tio Home on Discharge  Time spent:  76 Minutes  Ron Parker [facility] Pager [phone]  If 7PM-7AM, please contact night-coverage [email or web address] Password [facility] 05/25/2013, 4:27 AM

## 2013-05-25 NOTE — Consult Note (Signed)
ANTIBIOTIC CONSULT NOTE - INITIAL  Pharmacy Consult for Vancomycin Indication: rule out pneumonia  Allergies  Allergen Reactions  . Cymbalta (Duloxetine Hcl) Other (See Comments)    Makes my head do crazy things    Patient Measurements: Height: 5\' 7"  (170.2 cm) Weight: 146 lb 14.4 oz (66.633 kg) IBW/kg (Calculated) : 61.6  Vital Signs: Temp: 98.7 F (37.1 C) (06/15 0430) Temp src: Oral (06/15 0227) BP: 102/59 mmHg (06/15 0430) Pulse Rate: 85 (06/15 0430) Intake/Output from previous day:   Intake/Output from this shift: Total I/O In: 360 [P.O.:360] Out: -   Labs:  Recent Labs  05/25/13 0107 05/25/13 0515  WBC 15.6* 16.4*  HGB 15.1* 13.2  PLT 242 251  CREATININE 0.69 0.67   Estimated Creatinine Clearance: 85.4 ml/min (by C-G formula based on Cr of 0.67). Microbiology: No results found for this or any previous visit (from the past 720 hour(s)).  Medical History: Past Medical History  Diagnosis Date  . Post concussion syndrome 1999  . 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 Cervical pain (neck)     chronic  . Hypothyroidism   . Anxiety   . Arthritis     right hip   Assessment: 46yof begin admitted for CP - probable pericarditis.  CXR showed possible pneumonia and WBC is elevated. She will start empiric vancomycin and cefepime. Baseline renal function wnl.  Goal of Therapy:  Vancomycin trough level 15-20 mcg/ml  Plan:  1) Vancomycin 750mg  IV q8 2) Cefepime 1g IV q8 (as ordered per MD) 3) Follow renal function, cultures, trough at steady state  Fredrik Rigger 05/25/2013,11:25 AM

## 2013-05-26 ENCOUNTER — Telehealth: Payer: Self-pay | Admitting: Family Medicine

## 2013-05-26 DIAGNOSIS — J189 Pneumonia, unspecified organism: Secondary | ICD-10-CM

## 2013-05-26 DIAGNOSIS — R0789 Other chest pain: Secondary | ICD-10-CM | POA: Diagnosis present

## 2013-05-26 DIAGNOSIS — I319 Disease of pericardium, unspecified: Secondary | ICD-10-CM

## 2013-05-26 DIAGNOSIS — F411 Generalized anxiety disorder: Secondary | ICD-10-CM

## 2013-05-26 LAB — CBC
HCT: 37 % (ref 36.0–46.0)
Hemoglobin: 12.6 g/dL (ref 12.0–15.0)
MCV: 92.5 fL (ref 78.0–100.0)
RBC: 4 MIL/uL (ref 3.87–5.11)
WBC: 10.2 10*3/uL (ref 4.0–10.5)

## 2013-05-26 LAB — BASIC METABOLIC PANEL
BUN: 14 mg/dL (ref 6–23)
CO2: 24 mEq/L (ref 19–32)
Chloride: 100 mEq/L (ref 96–112)
Creatinine, Ser: 0.77 mg/dL (ref 0.50–1.10)

## 2013-05-26 MED ORDER — SODIUM CHLORIDE 0.9 % IV BOLUS (SEPSIS)
500.0000 mL | Freq: Once | INTRAVENOUS | Status: AC
  Administered 2013-05-26: 500 mL via INTRAVENOUS

## 2013-05-26 MED ORDER — INDOMETHACIN 50 MG PO CAPS
50.0000 mg | ORAL_CAPSULE | Freq: Three times a day (TID) | ORAL | Status: DC
  Administered 2013-05-26 – 2013-05-27 (×3): 50 mg via ORAL
  Filled 2013-05-26 (×5): qty 1

## 2013-05-26 NOTE — Progress Notes (Signed)
Utilization review completed.  

## 2013-05-26 NOTE — Telephone Encounter (Signed)
I as in formed Melanie Shepard is a pt. Has been admitted to [facility]

## 2013-05-26 NOTE — Progress Notes (Signed)
[facility] PROGRESS NOTE  Melanie Shepard ZOX:[id number] DOB: 11/07/1966 DOA: 05/25/2013 PCP: Lilyan Punt, MD  Assessment/Plan: 1. Chest Pain-  Troponins negative, continue Nitropaste , O2 and ASA Rx. CTA of Chest negative for PE -Appreciate cardiology assistance, await echo. Per cards will need Myoview at some 2 probable hospital acquired pneumonia/HCAP -Clinically much improved this a.m.-decreased pleuritic pain, leukocytosis resolved on antibiotics -Will follow and de-escalate antibiotics in a.m. if continues to improve. -Urine Legionella antigen and strep pneumo negative, HIV nonreactive 2. 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  3. Mild Hypokalemia- resolved 4. Hypothyroidism- continue Synthroid 5. Generalized Anxiety Disorder- Continue Anxiolytic Rx with Alprazolam.    Code Status: full Family Communication: family/significant other at bedside Disposition Plan: To home when medically ready   Consultants:  cardiology  Procedures:  Echocardiogram-  Antibiotics:  Vancomycin and cefepime started on 6/15  HPI/Subjective: States she feels much better today-decreased pleuritic pain  Objective: Filed Vitals:   05/26/13 0001 05/26/13 0227 05/26/13 0400 05/26/13 0805  BP: 80/37 95/59 97/56  102/56  Pulse: 68 72 72 60  Temp: 98 F (36.7 C)  98.3 F (36.8 C) 97.4 F (36.3 C)  TempSrc:      Resp: 16 18 20 18   Height:      Weight:      SpO2: 94%  94% 94%    Intake/Output Summary (Last 24 hours) at 05/26/13 1047 Last data filed at 05/25/13 2027  Gross per 24 hour  Intake   1120 ml  Output      0 ml  Net   1120 ml   Filed Weights   05/25/13 0430  Weight: 66.633 kg (146 lb 14.4 oz)    Exam:   General:  Alert and oriented x3 in no apparent distress  Cardiovascular: Regular rate and rhythm normal S1  Respiratory: Decreased breath sounds at the bases, otherwise clear to  auscultation  Abdomen: Soft, bowel sounds present nontender nondistended no organomegaly no masses heart  Extremities: No cyanosis and no edema  Data Reviewed: Basic Metabolic Panel:  Recent Labs Lab 05/25/13 0107 05/25/13 0515 05/26/13 0430  NA 135 133* 132*  K 3.4* 3.8 3.6  CL 99 99 100  CO2 22 22 24   GLUCOSE 131* 164* 88  BUN 8 7 14   CREATININE 0.69 0.67 0.77  CALCIUM 9.7 9.3 8.5   Liver Function Tests: No results found for this basename: AST, ALT, ALKPHOS, BILITOT, PROT, ALBUMIN,  in the last 168 hours No results found for this basename: LIPASE, AMYLASE,  in the last 168 hours No results found for this basename: AMMONIA,  in the last 168 hours CBC:  Recent Labs Lab 05/25/13 0107 05/25/13 0515 05/26/13 0430  WBC 15.6* 16.4* 10.2  HGB 15.1* 13.2 12.6  HCT 43.1 37.5 37.0  MCV 91.7 92.8 92.5  PLT 242 251 212   Cardiac Enzymes:  Recent Labs Lab 05/25/13 0155 05/25/13 0312 05/25/13 0800 05/25/13 1451  TROPONINI <0.30 <0.30 <0.30 <0.30   BNP (last 3 results) No results found for this basename: PROBNP,  in the last 8760 hours CBG: No results found for this basename: GLUCAP,  in the last 168 hours  No results found for this or any previous visit (from the past 240 hour(s)).   Studies: Dg Chest 2 View  05/25/2013   *RADIOLOGY REPORT*  Clinical Data: Chest pain.  CHEST - 2 VIEW  Comparison: 02/07/2012.  Findings: Significantly decreased depth of inspiration.  No gross change in a  normal sized heart.  Interval patchy and linear density at both lung bases and crowding of the pulmonary vasculature and interstitial markings.  No gross change in mild prominence of the interstitial markings.  Stable cervical spine fixation hardware. Cholecystectomy clips.  Stable mild levoconvex scoliosis.  Interval small bilateral pleural effusions.  IMPRESSION:  1.  Significantly decreased inspiration with bibasilar atelectasis and possible pneumonia. 2.  Interval small bilateral  pleural effusions. 3.  Grossly stable mild chronic interstitial lung disease.   Original Report Authenticated By: Beckie Salts, M.D.   Ct Angio Chest Pe W/cm &/or Wo Cm  05/25/2013   *RADIOLOGY REPORT*  Clinical Data: Chest pain, hypotensive  CT ANGIOGRAPHY CHEST  Technique:  Multidetector CT imaging of the chest using the standard protocol during bolus administration of intravenous contrast. Multiplanar reconstructed images including MIPs were obtained and reviewed to evaluate the vascular anatomy.  Contrast: OMNIPAQUE IOHEXOL 350 MG/ML SOLN  Comparison: None.  Findings: There is good contrast opacification of the pulmonary artery branches.  No discrete filling defect to suggest acute PE.Incomplete opacification of the thoracic aorta with no suggestion of dissection, aneurysm, or stenosis.  Small pericardial effusion.  Small pleural effusions, right greater than left.  Sub centimeter prevascular and pretracheal lymph nodes.  There is a single 11 mm enlarged precarinal lymph node.  Borderline right hilar adenopathy.  Cervical fixation hardware noted.  There is patchy atelectasis/consolidation in the posterior aspect of both lower lobes, more extensive right than left.  Spurring in the mid thoracic spine.  Sternum intact.  IMPRESSION: 1.  Negative for acute PE or thoracic aortic dissection. 2.  Small pericardial effusion. 3.  Bibasilar consolidation / atelectasis with small adjacent effusions, right greater than left. 4.  Nonspecific mild right hilar and precarinal adenopathy.   Original Report Authenticated By: D. Andria Rhein, MD    Scheduled Meds: . ALPRAZolam  1 mg Oral BID  . aspirin EC  325 mg Oral Daily  . baclofen  10 mg Oral TID  . ceFEPime (MAXIPIME) IV  1 g Intravenous Q8H  . cholecalciferol  1,000 Units Oral Daily  . enoxaparin (LOVENOX) injection  40 mg Subcutaneous Q24H  . levothyroxine  100 mcg Oral QAC breakfast  . methadone  20 mg Oral Q8H  . metoprolol succinate  25 mg Oral Daily   . sodium chloride  3 mL Intravenous Q12H  . 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 If 7PM-7AM, please contact night-coverage at [email or web address], password [employer] 05/26/2013, 10:47 AM  LOS: 1 day

## 2013-05-26 NOTE — Telephone Encounter (Signed)
Patient is now in [facility] She just wanted to let you know.

## 2013-05-26 NOTE — Progress Notes (Signed)
Echo Lab  2D Echocardiogram completed.  Braxson Hollingsworth L Timia Casselman, RDCS 05/26/2013 11:20 AM

## 2013-05-26 NOTE — Progress Notes (Addendum)
  Daily Progress Note Subjective:  Still notes having some chest pain, was worse with deep inspiration. Somewhat improved, but still present  Objective:  Vital Signs in the last 24 hours: Temp:  [97.4 F (36.3 C)-98.3 F (36.8 C)] 97.4 F (36.3 C) (06/16 0805) Pulse Rate:  [60-72] 60 (06/16 0805) Resp:  [16-20] 18 (06/16 0805) BP: (80-102)/(37-60) 102/56 mmHg (06/16 0805) SpO2:  [94 %] 94 % (06/16 0805)  Intake/Output from previous day: 06/15 0701 - 06/16 0700 In: 1480 [P.O.:1080; IV Piggyback:400] Out: -  Intake/Output from this shift: Not recorded    Physical Exam: General appearance: alert, cooperative, appears stated age, no distress and Smells of cigarettes, pleasant mood. Seems to be in pain. Neck: no carotid bruit, no JVD and supple, symmetrical, trachea midline Lungs: clear to auscultation bilaterally, normal percussion bilaterally, loop but with mild decreased inspiration the bases with intermittent crackles Heart: regular rate and rhythm, S1, S2 normal, no murmur, click, rub or gallop, normal apical impulse and Notable costochondral tenderness along the left and right sternal border. Generally worse in these areas with the perspiration. Definite worse palpation, reproducible Abdomen: soft, non-tender; bowel sounds normal; no masses,  no organomegaly Extremities: extremities normal, atraumatic, no cyanosis or edema Pulses: 2+ and symmetric Neurologic: Grossly normal  Lab Results:  Recent Labs  05/25/13 0515 05/26/13 0430  WBC 16.4* 10.2  HGB 13.2 12.6  PLT 251 212    Recent Labs  05/25/13 0515 05/26/13 0430  NA 133* 132*  K 3.8 3.6  CL 99 100  CO2 22 24  GLUCOSE 164* 88  BUN 7 14  CREATININE 0.67 0.77    Recent Labs  05/25/13 0800 05/25/13 1451  TROPONINI <0.30 <0.30   Imaging: Imaging results have been reviewed  Cardiac Studies: Echo performed, will a reviewed  Assessment/Plan:  Chest Pain - most likely musculoskeletal, and not cardiac.  Other potential differential would be pleuritic versus pericardiac pain. Do not suspect this to be anginal   Continue on with short course of NSAIDS -- convert to indomethacin at 50 mg 3 times a day for 3 days then 25 mg 3 times a day for 3 days then 25 mg twice a day for 3 days, followed by 25 mg daily for 3 days  will review the echocardiogram to confirm +/- pericardial effusion. If there is evidence of periportal effusion, would consider adding seen for two-week course. If not present would not add colchicine.  Provided her Echo is without major issues, she would be fine for d/c from a cardiology standpoint.   LOS: 1 day    HARDING,DAVID W 05/26/2013, 2:21 PM

## 2013-05-27 DIAGNOSIS — I309 Acute pericarditis, unspecified: Secondary | ICD-10-CM

## 2013-05-27 LAB — CBC
HCT: 35.1 % — ABNORMAL LOW (ref 36.0–46.0)
MCHC: 34.8 g/dL (ref 30.0–36.0)
MCV: 90.9 fL (ref 78.0–100.0)
RDW: 13.2 % (ref 11.5–15.5)
WBC: 8.1 10*3/uL (ref 4.0–10.5)

## 2013-05-27 MED ORDER — LEVOFLOXACIN 750 MG PO TABS: 750.0000 mg | ORAL_TABLET | Freq: Every day | ORAL | Status: DC

## 2013-05-27 MED ORDER — LEVOFLOXACIN 750 MG PO TABS
750.0000 mg | ORAL_TABLET | Freq: Every day | ORAL | Status: DC
  Filled 2013-05-27: qty 1

## 2013-05-27 MED ORDER — SODIUM CHLORIDE 0.9 % IV BOLUS (SEPSIS)
500.0000 mL | Freq: Once | INTRAVENOUS | Status: AC
  Administered 2013-05-27: 500 mL via INTRAVENOUS

## 2013-05-27 MED ORDER — PROMETHAZINE HCL 25 MG/ML IJ SOLN
12.5000 mg | Freq: Once | INTRAMUSCULAR | Status: AC
  Administered 2013-05-27: 12.5 mg via INTRAVENOUS
  Filled 2013-05-27: qty 1

## 2013-05-27 MED ORDER — INDOMETHACIN 25 MG PO CAPS: ORAL_CAPSULE | ORAL | Status: DC

## 2013-05-27 NOTE — Discharge Summary (Signed)
Physician Discharge Summary  Melanie Shepard ION:[id number] DOB: Jan 03, 1966 DOA: 05/25/2013  PCP: Lilyan Punt, MD  Admit date: 05/25/2013 Discharge date: 05/27/2013  Time spent: >30 minutes  Recommendations for Outpatient Follow-up:      Follow-up Information   Follow up with Governor Rooks, MD On 06/12/2013. (9:00 am )    Contact information:   [address] [place] [address] [phone]       Follow up with Lilyan Punt, MD. 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 female who presents to the Ed with complaints of return of chest pain over the past 24 hours. She describes having sharp shooting chest pain without radiation and was associated with SOB, and nausea but no vomting or diaphoresis. She was had severe chest pain 4 days ago and was admitted to the [facility] and was ruled out for ACS at that time per her report. Her cardiologist is Dr. Alanda Amass, and she reports having a negative stress test in the past.    Hospital Course:  1. Chest Pain- as discussed above, upon admission cardiac enzymes were cycled and Troponins negative, she was placed on nitrates O2 and ASA Rx. CTA of Chest negative for PE. Cardiology was consulted and saw patient on an echocardiogram was obtained and showed and EF of 60-65% with a small free-flowing pericardial effusion-discussed with Dr.  Royann Shivers who was states that this is insignificant. Patient described her pain as pleuritic and was started on an anti-inflammatory, per cardiology and this is to be tapered off on discharge. She was also treated for him probable pneumonia in the hospital likely causing her pleuritic pain and her symptoms improved. Cardiology followed patient and stated that she would likely need a Myoview at some point and she is to followup with Dr. Alanda Amass. 2 probable hospital acquired pneumonia/HCAP  -As discussed above CT angiogram showed consolidation/atelectasis and patient presented with pleuritic arm pain and a leukocytosis. She was treated with IV antibiotics and her leukocytosis resolved and she rapidly improved clinically. She'll be discharged on oral Levaquin at this time and is to follow up with her PCP.workup per pneumonia protocol was done and her Urine Legionella antigen and strep pneumo negative, HIV nonreactive. She was noted to be hypotensive this a.m. and she states her blood pressures normally run in the 90s, she is asymptomatic and recheck blood pressure prior to discharge 95/55, she is afebrile with no leukocytosis the. She is to followup with her PCP upon discharge. 2. 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  3. Mild Hypokalemia- resolved, potassium was replaced to the hospital. 4. Hypothyroidism- continue Synthroid  5. Generalized Anxiety Disorder- Continue Anxiolytic Rx with Alprazolam.    Procedures:  Echo  Study Conclusions  - Left ventricle: The cavity size was normal. Wall thickness was normal. Systolic function was normal. The estimated ejection fraction was  in the range of 60% to 65%. Wall motion was normal; there were no regional wall motion abnormalities. Left ventricular diastolic function parameters were normal. - Pericardium, extracardiac: A small, free-flowing pericardial effusion was identified  circumferential to the heart. The fluid had no internal echoes.There was no evidence of hemodynamic compromise.    Consultations:  Cardiology-Dr. Jomarie Longs  Discharge Exam: Filed Vitals:   05/27/13 0501 05/27/13 0726 05/27/13 1041 05/27/13 1200  BP: 89/54 105/68 89/50 95/55   Pulse: 62 61 74 67  Temp: 97.9 F (36.6 C) 98.2 F (36.8 C) 98.2 F (36.8 C)   TempSrc: Oral Oral Oral   Resp: 18 16 16 18   Height:      Weight:      SpO2: 91% 92% 98%    Exam:  General: Alert and oriented x3 in no apparent distress  Cardiovascular: Regular rate and rhythm normal S1  Respiratory: Decreased breath sounds at the bases, otherwise clear to auscultation  Abdomen: Soft, bowel sounds present nontender nondistended no organomegaly no masses heart  Extremities: No cyanosis and no edema   Discharge Instructions  Discharge Orders   Future Appointments Provider Department Dept Phone   05/28/2013 2:30 PM Babs Sciara, MD [facility] [phone]   Future Orders Complete By Expires     Diet - low sodium heart healthy  As directed     Increase activity slowly  As directed         Medication List    TAKE these medications       ALPRAZolam 1 MG tablet  Commonly known as:  XANAX  Take 1 tablet (1 mg total) by mouth 2 (two) times daily.     baclofen 10 MG tablet  Commonly known as:  LIORESAL  Take 10 mg by mouth 3 (three) times daily.     indomethacin 25 MG capsule  Commonly known as:  INDOCIN  Take 2tabs TID for days, then 1tab BID for 3days, then 1tab daily for 3days then stop     LACTOBACILLUS PO  Take 1 tablet by mouth daily. 1 billion cfu     levofloxacin 750 MG tablet  Commonly known as:  LEVAQUIN  Take 1 tablet (750 mg total) by mouth daily.     levothyroxine 100 MCG tablet  Commonly known as:  SYNTHROID, LEVOTHROID  Take 100 mcg by mouth daily.     methadone 10 MG tablet  Commonly known as:  DOLOPHINE  Take 20 mg by mouth every 8 (eight) hours. 2  tablets 3 times  day     metoprolol succinate 25 MG 24 hr tablet  Commonly known as:  TOPROL-XL  Take 25 mg by mouth daily.     Vitamin D3 5000 UNITS Caps  Take 1 tablet by mouth daily.       Allergies  Allergen Reactions  . Cymbalta (Duloxetine Hcl) Other (See Comments)    Makes my head do crazy things       Follow-up Information   Follow up with Governor Rooks, MD On 06/12/2013. (9:00 am )    Contact information:   [address] [place] [address] [phone]       Follow up with Lilyan Punt, MD. (in 1-2weeks, call for appt upon discharge)    Contact information:   [address] [place] [address] [phone]        The results of significant diagnostics from this hospitalization (including imaging, microbiology, ancillary and laboratory) are listed below  for reference.    Significant Diagnostic Studies: Dg Chest 2 View  05/25/2013   *RADIOLOGY REPORT*  Clinical Data: Chest pain.  CHEST - 2 VIEW  Comparison: 02/07/2012.  Findings: Significantly decreased depth of inspiration.  No gross change in a normal sized heart.  Interval patchy and linear density at both lung bases and crowding of the pulmonary vasculature and interstitial markings.  No gross change in mild prominence of the interstitial markings.  Stable cervical spine fixation hardware. Cholecystectomy clips.  Stable mild levoconvex scoliosis.  Interval small bilateral pleural effusions.  IMPRESSION:  1.  Significantly decreased inspiration with bibasilar atelectasis and possible pneumonia. 2.  Interval small bilateral pleural effusions. 3.  Grossly stable mild chronic interstitial lung disease.   Original Report Authenticated By: Beckie Salts, M.D.   Dg Hip Complete Right  04/28/2013   *RADIOLOGY REPORT*  Clinical Data: Hip pain  RIGHT HIP - COMPLETE 2+ VIEW  Comparison: None  Findings: The right hip is located.  There is no fracture or subluxation identified.  No fracture or  subluxation is identified.  Mild osteoarthritis is noted with marginal spur formation identified.  IMPRESSION:  1.  No acute findings. 2.  Osteoarthritis.   Original Report Authenticated By: Signa Kell, M.D.   Ct Angio Chest Pe W/cm &/or Wo Cm  05/25/2013   *RADIOLOGY REPORT*  Clinical Data: Chest pain, hypotensive  CT ANGIOGRAPHY CHEST  Technique:  Multidetector CT imaging of the chest using the standard protocol during bolus administration of intravenous contrast. Multiplanar reconstructed images including MIPs were obtained and reviewed to evaluate the vascular anatomy.  Contrast: OMNIPAQUE IOHEXOL 350 MG/ML SOLN  Comparison: None.  Findings: There is good contrast opacification of the pulmonary artery branches.  No discrete filling defect to suggest acute PE.Incomplete opacification of the thoracic aorta with no suggestion of dissection, aneurysm, or stenosis.  Small pericardial effusion.  Small pleural effusions, right greater than left.  Sub centimeter prevascular and pretracheal lymph nodes.  There is a single 11 mm enlarged precarinal lymph node.  Borderline right hilar adenopathy.  Cervical fixation hardware noted.  There is patchy atelectasis/consolidation in the posterior aspect of both lower lobes, more extensive right than left.  Spurring in the mid thoracic spine.  Sternum intact.  IMPRESSION: 1.  Negative for acute PE or thoracic aortic dissection. 2.  Small pericardial effusion. 3.  Bibasilar consolidation / atelectasis with small adjacent effusions, right greater than left. 4.  Nonspecific mild right hilar and precarinal adenopathy.   Original Report Authenticated By: D. Andria Rhein, MD    Microbiology: No results found for this or any previous visit (from the past 240 hour(s)).   Labs: Basic Metabolic Panel:  Recent Labs Lab 05/25/13 0107 05/25/13 0515 05/26/13 0430  NA 135 133* 132*  K 3.4* 3.8 3.6  CL 99 99 100  CO2 22 22 24   GLUCOSE 131* 164* 88  BUN 8 7 14    CREATININE 0.69 0.67 0.77  CALCIUM 9.7 9.3 8.5   Liver Function Tests: No results found for this basename: AST, ALT, ALKPHOS, BILITOT, PROT, ALBUMIN,  in the last 168 hours No results found for this basename: LIPASE, AMYLASE,  in the last 168 hours No results found for this basename: AMMONIA,  in the last 168 hours CBC:  Recent Labs Lab 05/25/13 0107 05/25/13 0515 05/26/13 0430 05/27/13 0415  WBC 15.6* 16.4* 10.2 8.1  HGB 15.1* 13.2 12.6 12.2  HCT 43.1 37.5 37.0 35.1*  MCV 91.7 92.8 92.5 90.9  PLT 242 251 212 194   Cardiac Enzymes:  Recent Labs Lab 05/25/13 0155 05/25/13 0312 05/25/13 0800 05/25/13 1451  TROPONINI <0.30 <0.30 <0.30 <0.30   BNP: BNP (last 3 results) No results found for this basename: PROBNP,  in the last 8760 hours CBG: No results found for this basename: GLUCAP,  in the last 168 hours     Signed:  Dewaun Kinzler C  [facility] 05/27/2013, 1:33 PM

## 2013-05-27 NOTE — Progress Notes (Signed)
[facility]  Subjective: Still has mild pleuritic chest pain.  Objective: Vital signs in last 24 hours: Temp:  [97.9 F (36.6 C)-98.2 F (36.8 C)] 98.2 F (36.8 C) (06/17 0726) Pulse Rate:  [55-63] 61 (06/17 0726) Resp:  [16-18] 16 (06/17 0726) BP: (89-115)/(48-68) 105/68 mmHg (06/17 0726) SpO2:  [91 %-98 %] 92 % (06/17 0726) Last BM Date: 05/25/13  Intake/Output from previous day:   Intake/Output this shift: Total I/O In: 240 [P.O.:240] Out: -   Medications Current Facility-Administered Medications  Medication Dose Route Frequency Provider Last Rate Last Dose  . 0.9 %  sodium chloride infusion  250 mL Intravenous PRN Ron Parker, MD      . acetaminophen (TYLENOL) tablet 650 mg  650 mg Oral Q6H PRN Ron Parker, MD   650 mg at 05/26/13 1845   Or  . acetaminophen (TYLENOL) suppository 650 mg  650 mg Rectal Q6H PRN Ron Parker, MD      . ALPRAZolam Prudy Feeler) tablet 1 mg  1 mg Oral BID Ron Parker, MD   1 mg at 05/26/13 2145  . alum & mag hydroxide-simeth (MAALOX/MYLANTA) 200-200-20 MG/5ML suspension 30 mL  30 mL Oral Q6H PRN Ron Parker, MD      . aspirin EC tablet 325 mg  325 mg Oral Daily Ron Parker, MD   325 mg at 05/26/13 0919  . baclofen (LIORESAL) tablet 10 mg  10 mg Oral TID Ron Parker, MD   10 mg at 05/26/13 2145  . ceFEPIme (MAXIPIME) 1 g in dextrose 5 % 50 mL IVPB  1 g Intravenous Q8H Adeline C Viyuoh, MD   1 g at 05/27/13 0341  . cholecalciferol (VITAMIN D) tablet 1,000 Units  1,000 Units Oral Daily Ron Parker, MD   1,000 Units at 05/26/13 0919  . diphenhydrAMINE (BENADRYL) injection 25 mg  25 mg Intravenous Q6H PRN Ron Parker, MD   25 mg at 05/25/13 0452  . enoxaparin (LOVENOX) injection 40 mg  40 mg Subcutaneous Q24H Ron Parker, MD   40 mg at 05/26/13 0921  . HYDROmorphone (DILAUDID) injection 0.5-1 mg  0.5-1 mg Intravenous Q3H PRN Ron Parker, MD   1 mg at  05/27/13 0018  . indomethacin (INDOCIN) capsule 50 mg  50 mg Oral TID WC Marykay Lex, MD   50 mg at 05/27/13 1610  . levothyroxine (SYNTHROID, LEVOTHROID) tablet 100 mcg  100 mcg Oral QAC breakfast Ron Parker, MD   100 mcg at 05/27/13 9604  . methadone (DOLOPHINE) tablet 20 mg  20 mg Oral Q8H Harvette C Jenkins, MD      . metoprolol succinate (TOPROL-XL) 24 hr tablet 25 mg  25 mg Oral Daily Ron Parker, MD   25 mg at 05/26/13 0919  . nitroGLYCERIN (NITROSTAT) SL tablet 0.4 mg  0.4 mg Sublingual Q5 min PRN Sunnie Nielsen, MD   0.4 mg at 05/25/13 0206  . ondansetron (ZOFRAN) tablet 4 mg  4 mg Oral Q6H PRN Ron Parker, MD   4 mg at 05/27/13 0224   Or  . ondansetron (ZOFRAN) injection 4 mg  4 mg Intravenous Q6H PRN Ron Parker, MD      . oxyCODONE (Oxy IR/ROXICODONE) immediate release tablet 5 mg  5 mg Oral Q4H PRN Ron Parker, MD   5 mg at 05/27/13 5409  . sodium chloride 0.9 % injection 3 mL  3 mL Intravenous Q12H  Ron Parker, MD   3 mL at 05/26/13 0981  . sodium chloride 0.9 % injection 3 mL  3 mL Intravenous PRN Ron Parker, MD      . vancomycin (VANCOCIN) IVPB 750 mg/150 ml premix  750 mg Intravenous Q8H Hessie Diener Seward, RPH   750 mg at 05/27/13 0400  . zolpidem (AMBIEN) tablet 5 mg  5 mg Oral QHS PRN Ron Parker, MD        PE: General appearance: alert, cooperative and no distress Lungs: clear to auscultation bilaterally Heart: regular rate and rhythm, S1, S2 normal, no murmur, click, rub or gallop Extremities: no LEE Pulses: 2+ and symmetric Skin: warm and dry Neurologic: Grossly normal  Lab Results:   Recent Labs  05/25/13 0515 05/26/13 0430 05/27/13 0415  WBC 16.4* 10.2 8.1  HGB 13.2 12.6 12.2  HCT 37.5 37.0 35.1*  PLT 251 212 194   BMET  Recent Labs  05/25/13 0107 05/25/13 0515 05/26/13 0430  NA 135 133* 132*  K 3.4* 3.8 3.6  CL 99 99 100  CO2 22 22 24   GLUCOSE 131* 164* 88  BUN 8 7 14   CREATININE  0.69 0.67 0.77  CALCIUM 9.7 9.3 8.5    Studies/Results:  2D echo 05/26/13 Study Conclusions  - Left ventricle: The cavity size was normal. Wall thickness was normal. Systolic function was normal. The estimated ejection fraction was in the range of 60% to 65%. Wall motion was normal; there were no regional wall motion abnormalities. Left ventricular diastolic function parameters were normal. - Pericardium, extracardiac: A small, free-flowing pericardial effusion was identified circumferential to the heart. The fluid had no internal echoes.There was no evidence of hemodynamic compromise.  Assessment/Plan  Principal Problem:   Chest pain, musculoskeletal Active Problems:   Chronic cervical pain after C-spine surgey   Generalized anxiety disorder   Unspecified hypothyroidism   Chest pain-pleuritic   COPD exacerbation   Active smoker  Plan: F/U 2 D echo yesterday revealed a small, free-flowing pericardial effusion, circumferential to the heart. No evidence of hemodynamic compromise. She still has pleuritic chest pain, likely due to PNA. She is stable from a cardiovascular standpoint for discharge home. She will resume oral antibiotics as an outpatient. Will arrange a f/u appointment with Dr. Alanda Amass.     LOS: 2 days    Melanie M. Sharol Harness, PA-C 05/27/2013 10:17 AM  I have seen and examined the patient along with Melanie M. Sharol Harness, PA-C.  I have reviewed the chart, notes and new data.  I agree with PA's note.  Key new complaints: improved pleuritic discomfort Key examination changes: no rub, no fever Key new findings / data: echo as described above  PLAN: I am not sure whether she has bacterial pneumonia with parapneumonic acute pericarditis or she has simultaneous viral pneumonia and pericarditis. Either way, one would expect symptoms to abate within a week or so. NSAIDs for symptom relief. Will arrange outpatient follow up. I do not think the pericardial effusion is  bad enough to justify colchicine Rx.  Thurmon Fair, MD, [facility] [phone] 05/27/2013, 10:41 AM

## 2013-05-28 ENCOUNTER — Ambulatory Visit (INDEPENDENT_AMBULATORY_CARE_PROVIDER_SITE_OTHER): Payer: BC Managed Care – PPO | Admitting: Family Medicine

## 2013-05-28 ENCOUNTER — Encounter: Payer: Self-pay | Admitting: Family Medicine

## 2013-05-28 VITALS — BP 110/64 | Wt 149.8 lb

## 2013-05-28 DIAGNOSIS — M542 Cervicalgia: Secondary | ICD-10-CM

## 2013-05-28 DIAGNOSIS — G8929 Other chronic pain: Secondary | ICD-10-CM

## 2013-05-28 MED ORDER — CEFPROZIL 500 MG PO TABS: 500.0000 mg | ORAL_TABLET | Freq: Two times a day (BID) | ORAL | Status: DC

## 2013-05-28 NOTE — Progress Notes (Signed)
  Subjective:    Patient ID: Melanie Shepard, female    DOB: November 02, 1966, 47 y.o.   MRN: [id number]  HPI Patient comes in today long history regarding chest pain that she had she went to [facility] was worked up then was released then went to an he [facility] transferred [facility] hospital was diagnosed with pleurisy and fluid around the heart was treated with anti-inflammatories she was also found to have pneumonia was put on Levaquin the pharmacist told her do not take Levaquin with methadone she is here today for she has not taken methadone in 3 days she is having severe pain she was on oxycodone in the hospital she wonders if she can go back to oxycodone she denies chest tightness pressure pain shortness of breath. She is frustrated by her pain she is frustrated by having to see the pain management doctor she would like to go back to oxycodone. Hospital records from [place] as well as hospital records from [place] were reviewed while the patient was present Patient does have a history of smoking she knows she needs to quit Her husband is here with her today she has chronic neck and shoulder pain she is disabled. Family history noncontributory    Review of Systems See per above. Having very little coughing no fevers or chills no wheezing or difficulty breathing    Objective:   Physical Exam Neck no masses moderate tenderness and pain subjective discomfort left-sided neck and shoulder lungs are clear no crackle heart is regular abdomen is soft no masses extremities no edema skin warm dry neurologic grossly normal       Assessment & Plan:  Severe neck pain-I switched off her Levaquin for Cefzil she may restart her methadone she may followup with her pain doctor. She can talk with him about switching over to something different. If she should choose to go back to oxycodone I told her we could do Percocet 10 mg/325 mg 1 every 6 hours as needed for a total of 120 per month. I do not  think it is wise to go above that currently.  Chest pain this is due to pleurisy should gradually get better as inflammation goes down. Call us if any progressive troubles. In this and under the direction of Dr. Alanda Amass. Followup with cardiology as expected.  Patient is disabled currently between her chronic pain medicines the level of pain she has in her neck and shoulder he makes it very difficult for her to do any type of twisting lifting with the arms turning of the head she is constant pain she finds herself crying frequently with it I don't believe this person can hold regular work because of this. Patient will followup again in about a month's time sooner if any problems. She will let us know regarding if she wants to go back to oxycodone.

## 2013-05-29 ENCOUNTER — Telehealth: Payer: Self-pay | Admitting: Family Medicine

## 2013-05-29 MED ORDER — ONDANSETRON HCL 8 MG PO TABS: 8.0000 mg | ORAL_TABLET | Freq: Three times a day (TID) | ORAL | Status: DC | PRN

## 2013-05-29 NOTE — Telephone Encounter (Signed)
Patient says that all of the medication that she is having to take is making her nauseous and she would like something for nausea called in to [facility] in [place]

## 2013-05-29 NOTE — Telephone Encounter (Signed)
Meds sent electronically to [facility]. Patient notified.

## 2013-05-29 NOTE — Telephone Encounter (Signed)
zofran 8 mg , 21, one tid prn , 3 refills

## 2013-05-30 ENCOUNTER — Ambulatory Visit: Payer: BC Managed Care – PPO | Admitting: Nurse Practitioner

## 2013-06-16 ENCOUNTER — Ambulatory Visit: Payer: BC Managed Care – PPO | Admitting: Family Medicine

## 2013-06-18 ENCOUNTER — Encounter: Payer: Self-pay | Admitting: Family Medicine

## 2013-06-18 ENCOUNTER — Telehealth: Payer: Self-pay | Admitting: Family Medicine

## 2013-06-18 NOTE — Telephone Encounter (Signed)
done

## 2013-06-18 NOTE — Telephone Encounter (Signed)
Work note expired on Monday June 16, 2013.  Patient would like an updated work excuse.  Please call Patient.  Thanks

## 2013-06-18 NOTE — Telephone Encounter (Signed)
Give work excuse July 7 through Sep 16 2013. Will discuss further on next OV

## 2013-06-19 ENCOUNTER — Other Ambulatory Visit: Payer: Self-pay | Admitting: *Deleted

## 2013-06-19 DIAGNOSIS — R0789 Other chest pain: Secondary | ICD-10-CM

## 2013-06-20 ENCOUNTER — Encounter: Payer: Self-pay | Admitting: Cardiovascular Disease

## 2013-06-24 ENCOUNTER — Telehealth (HOSPITAL_COMMUNITY): Payer: Self-pay | Admitting: Cardiovascular Disease

## 2013-06-24 NOTE — Telephone Encounter (Signed)
LEFT MESSAGE FOR PATIENT TO CALL AND SCHEDULE STRESS TEST ORDERED BY RAW

## 2013-07-01 DIAGNOSIS — Z0289 Encounter for other administrative examinations: Secondary | ICD-10-CM

## 2013-07-08 ENCOUNTER — Encounter: Payer: Self-pay | Admitting: Family Medicine

## 2013-07-08 ENCOUNTER — Ambulatory Visit (INDEPENDENT_AMBULATORY_CARE_PROVIDER_SITE_OTHER): Payer: BC Managed Care – PPO | Admitting: Family Medicine

## 2013-07-08 VITALS — BP 102/68 | Temp 98.5°F | Wt 151.4 lb

## 2013-07-08 DIAGNOSIS — R0600 Dyspnea, unspecified: Secondary | ICD-10-CM | POA: Insufficient documentation

## 2013-07-08 DIAGNOSIS — E039 Hypothyroidism, unspecified: Secondary | ICD-10-CM

## 2013-07-08 DIAGNOSIS — R0989 Other specified symptoms and signs involving the circulatory and respiratory systems: Secondary | ICD-10-CM

## 2013-07-08 NOTE — Progress Notes (Signed)
  Subjective:    Patient ID: Melanie Shepard, female    DOB: 26-Jul-1966, 47 y.o.   MRN: [id number]  Chest Pain  This is a new problem. The current episode started 1 to 4 weeks ago. The problem has been gradually improving.   this patient was in the hospital was what was termed as pneumonia and possible COPD exacerbation patient's or smoker over the past many years it surprised her that she was diagnosed with COPD. Reviewing her hospital records show no sign of COPD on x-ray or there is no sign of pulmonary function tests as well. Patient also relates that she is under the care of the pain medicine doctor for methadone but she would like to go back to oxycodone she is going to discuss this with the pain medicine doctor and if she goes back to oxycodone 10 mg 5 per day that is something we would be able to help take care.   Tobacco abuse Chronic neck pain Hypothyroidism followed by cardiologist the patient was told that this could be turned over to [place].    Review of Systems  Cardiovascular: Positive for chest pain.   patient denies any chest pressure or discomfort she has a stress test coming up with cardiology.     Objective:   Physical Exam Lungs are clear hearts regular pulse normal extremities no edema skin warm dry subjective discomfort in her neck       Assessment & Plan:  #1 chronic neck pain-follow through with pain medicine doctor possibly will be coming back to [place] for oxycodone prescriptions. Currently she needs to come off of methadone in successfully transferred back over to oxycodone #2 COPD-this is a questionable diagnosis. I recommend pulmonary function tests. #3 chest discomfort follow through with cardiology for stress test #4 hypothyroidism check lab work continue current medication followup 3 months

## 2013-07-09 ENCOUNTER — Ambulatory Visit (HOSPITAL_COMMUNITY)
Admission: RE | Admit: 2013-07-09 | Discharge: 2013-07-09 | Disposition: A | Discharge status: Home / Self Care | Payer: BC Managed Care – PPO | Source: Ambulatory Visit | Attending: Cardiovascular Disease | Admitting: Cardiovascular Disease

## 2013-07-09 DIAGNOSIS — R0789 Other chest pain: Secondary | ICD-10-CM

## 2013-07-09 DIAGNOSIS — J449 Chronic obstructive pulmonary disease, unspecified: Secondary | ICD-10-CM | POA: Insufficient documentation

## 2013-07-09 DIAGNOSIS — F17200 Nicotine dependence, unspecified, uncomplicated: Secondary | ICD-10-CM | POA: Insufficient documentation

## 2013-07-09 DIAGNOSIS — R5383 Other fatigue: Secondary | ICD-10-CM | POA: Insufficient documentation

## 2013-07-09 DIAGNOSIS — R9431 Abnormal electrocardiogram [ECG] [EKG]: Secondary | ICD-10-CM | POA: Insufficient documentation

## 2013-07-09 DIAGNOSIS — J4489 Other specified chronic obstructive pulmonary disease: Secondary | ICD-10-CM | POA: Insufficient documentation

## 2013-07-09 DIAGNOSIS — R5381 Other malaise: Secondary | ICD-10-CM | POA: Insufficient documentation

## 2013-07-09 DIAGNOSIS — R079 Chest pain, unspecified: Secondary | ICD-10-CM | POA: Insufficient documentation

## 2013-07-09 DIAGNOSIS — Z8249 Family history of ischemic heart disease and other diseases of the circulatory system: Secondary | ICD-10-CM | POA: Insufficient documentation

## 2013-07-09 MED ORDER — TECHNETIUM TC 99M SESTAMIBI GENERIC - CARDIOLITE
10.0000 | Freq: Once | INTRAVENOUS | Status: AC | PRN
  Administered 2013-07-09: 10 via INTRAVENOUS

## 2013-07-09 MED ORDER — REGADENOSON 0.4 MG/5ML IV SOLN
0.4000 mg | Freq: Once | INTRAVENOUS | Status: AC
  Administered 2013-07-09: 0.4 mg via INTRAVENOUS

## 2013-07-09 MED ORDER — TECHNETIUM TC 99M SESTAMIBI GENERIC - CARDIOLITE
30.0000 | Freq: Once | INTRAVENOUS | Status: AC | PRN
  Administered 2013-07-09: 30 via INTRAVENOUS

## 2013-07-09 NOTE — Procedures (Addendum)
[facility] [address] [place] [address] [phone]  Cardiology Nuclear Med Study  Melanie Shepard is a 47 y.o. female     MRN : [id number]     DOB: October 13, 1966  Procedure Date: 07/09/2013  Nuclear Med Background Indication for Stress Test:  Evaluation for Ischemia and Abnormal EKG History:  COPD and hx of TBI Cardiac Risk Factors: Family History - CAD and Smoker  Symptoms:  Chest Pain, Fatigue and SOB   Nuclear Pre-Procedure Caffeine/Decaff Intake:  10:00pm NPO After: 8:00am   IV Site: R Hand  IV 0.9% NS with Angio Cath:  22g  Chest Size (in):  N/A IV Started by: Emmit Pomfret, RN  Height: 5' 7.5" (1.715 m)  Cup Size: C  BMI:  Body mass index is 22.67 kg/(m^2). Weight:  147 lb (66.679 kg)   Tech Comments:  N/A    Nuclear Med Study 1 or 2 day study: 1 day  Stress Test Type:  Lexiscan  Order Authorizing Provider:  Susa Griffins, MD   Resting Radionuclide: Technetium 39m Sestamibi  Resting Radionuclide Dose: 10.5 mCi   Stress Radionuclide:  Technetium 8m Sestamibi  Stress Radionuclide Dose: 29.9 mCi           Stress Protocol Rest HR: 77 Stress HR: 86  Rest BP: 103/70 Stress BP: 101/72  Exercise Time (min): n/a METS: n/a   Predicted Max HR: 174 bpm % Max HR: 62.64 bpm Rate Pressure Product: 13244  Dose of Adenosine (mg):  n/a Dose of Lexiscan: 0.4 mg  Dose of Atropine (mg): n/a Dose of Dobutamine: n/a mcg/kg/min (at max HR)  Stress Test Technologist: Esperanza Sheets, CCT Nuclear Technologist: Koren Shiver, CNMT   Rest Procedure:  Myocardial perfusion imaging was performed at rest 45 minutes following the intravenous administration of Technetium 76m Sestamibi. Stress Procedure:  The patient received IV Lexiscan 0.4 mg over 15-seconds.  Technetium 3m Sestamibi injected at 30-seconds.  There were no significant changes with Lexiscan.  Quantitative spect images were obtained after a 45 minute  delay.  Transient Ischemic Dilatation (Normal <1.22):  0.77  Lung/Heart Ratio (Normal <0.45)0.41 QGS EDV:  49 ml QGS ESV:  6 ml LV Ejection Fraction: 88%     Rest ECG: NSR - Normal EKG  Stress ECG: No significant change from baseline ECG  QPS Raw Data Images:  There is no interference from nuclear activity from structures below the diaphragm.  This impairs slightly the ability to evaluate the inferolateral wall. Stress Images:  Normal homogeneous uptake in all areas of the myocardium. Rest Images:  Normal homogeneous uptake in all areas of the myocardium. Subtraction (SDS):  No evidence of ischemia.  Impression Exercise Capacity:  Lexiscan with no exercise. BP Response:  Normal blood pressure response. Clinical Symptoms:  No significant symptoms noted. ECG Impression:  No significant ST segment change suggestive of ischemia. Comparison with Prior Nuclear Study: No significant change from previous study  Overall Impression:  Normal stress nuclear study.  LV Wall Motion:  NL LV Function; NL Wall Motion   [name], MD  07/09/2013 1:46 PM

## 2013-07-11 ENCOUNTER — Telehealth: Payer: Self-pay | Admitting: *Deleted

## 2013-07-11 MED ORDER — PREDNISONE 20 MG PO TABS: ORAL_TABLET | ORAL | Status: DC

## 2013-07-11 NOTE — Telephone Encounter (Signed)
Unfortunately she appears to be having some bursitis issues in the shoulder. Short course of steroids might help. Patient may need followup office visit. Also range of motion exercises can help. Please triage a  little further. Ask patient if she did anything that injured the shoulder. It suggests shoulder pain?. Increased pain with movement? Has she tried anything for it.?

## 2013-07-11 NOTE — Telephone Encounter (Signed)
No injury just started after stress test

## 2013-07-11 NOTE — Telephone Encounter (Signed)
Since doing stress test she is having shoulder pain that is different from her usual shouler pain. She states it hurts so bad she cannot function. She is taking methadone for pain but it is not helping please advise

## 2013-07-11 NOTE — Addendum Note (Signed)
Addended by: Margaretha Sheffield on: 07/11/2013 04:34 PM   Modules accepted: Orders

## 2013-07-11 NOTE — Telephone Encounter (Signed)
Med sent electronically to [facility]. Patient notified.

## 2013-07-11 NOTE — Telephone Encounter (Signed)
She can try prednisone 20 mg, 3 qd for 2 d then 2qd for 3d then 1 qd for 3d , if ongoing follow up. No nsaids with prednisione

## 2013-07-15 ENCOUNTER — Ambulatory Visit (HOSPITAL_COMMUNITY): Admission: RE | Admit: 2013-07-15 | Payer: BC Managed Care – PPO | Source: Ambulatory Visit

## 2013-07-17 ENCOUNTER — Telehealth: Payer: Self-pay | Admitting: Family Medicine

## 2013-07-17 ENCOUNTER — Ambulatory Visit (INDEPENDENT_AMBULATORY_CARE_PROVIDER_SITE_OTHER): Payer: BC Managed Care – PPO | Admitting: Family Medicine

## 2013-07-17 ENCOUNTER — Encounter: Payer: Self-pay | Admitting: Family Medicine

## 2013-07-17 VITALS — BP 90/62 | HR 92 | Ht 67.5 in | Wt 151.2 lb

## 2013-07-17 DIAGNOSIS — M542 Cervicalgia: Secondary | ICD-10-CM

## 2013-07-17 DIAGNOSIS — G8929 Other chronic pain: Secondary | ICD-10-CM

## 2013-07-17 MED ORDER — OXYCODONE HCL 10 MG PO TABS: 10.0000 mg | ORAL_TABLET | Freq: Four times a day (QID) | ORAL | Status: DC | PRN

## 2013-07-17 NOTE — Telephone Encounter (Signed)
I spoke with the pharmacist. They stated they were filling the medication. I cannot control what insurance company covers. The patient has methadone for Thursday Friday and Saturday. She will start the oxycodone on Sunday. I told the pharmacist that they couldn't fill the medicine oxycodone for today and she would start it on Sunday. ( She needs to call back to the pharmacy to see if there is anything they can do in regards to the insurance company. Let us know or have pharmacy call us if there is anything specific we can do. Currently I feel like I've done everything I couldn't possibly do. It is true that we discarded the rest of her medicines but she has enough medicine to take care of Thursday Friday and Saturday.

## 2013-07-17 NOTE — Telephone Encounter (Signed)
Patient called numerous times and wants her medication Oxycodone HCl 10 MG TABS filled early before Sunday July 20, 2013).  Patient was informed that the medication could not be filled early due to Insurance would not approve.  Patient states she needs her medication because Dr. Lorin Picket trashed her medication while she was in the office for her office visit.  Please call Patient. Thanks

## 2013-07-17 NOTE — Progress Notes (Signed)
  Subjective:    Patient ID: Melanie Shepard, female    DOB: [birth year], 47 y.o.   MRN: [id number]  HPI Comments: Patient is here for chronic neck/shoulder pain.  She does not like the Methadone that was prescribed to her 2 months ago.  The last time she took Methadone was last night. She said it makes her dizzy and lightheaded. She would prefer to go back on the oxycodone.   No other concerns.  Neck Pain  This is a chronic problem. The problem occurs constantly. The problem has been gradually worsening. The pain is moderate. The pain is same all the time. She has tried heat and oral narcotics for the symptoms. The treatment provided mild relief.   This patient relates that methadone is causing significant dizziness and lightheadedness. She states she is trying to communicate several times with pain medication Dr. in the best they could do is give her an appointment for September. She states she no longer wants to be on methadone. She states she would like to get back up Percocet in use at maximum 4 times a day currently. She denies abusing medications or drugs. She brings her medication in today the pill count correlates with the prescription date.  She does have a history chronic neck pain she is disabled. She relates she did not have problems with low blood pressure until she started taking methadone.  Review of Systems  HENT: Positive for neck pain.        Objective:   Physical Exam  Subjective discomfort in the neck. Lungs clear heart regular. Pulse normal blood pressure slightly low.      Assessment & Plan:  Chronic neck pain-patient is to reduce methadone down to 10 mg tablets no greater than 5 per day for the next 3 days then the following day May start oxycodone 10 mg. No Tylenol component. She may use 1 pill 4 times daily as needed for pain. Prescription for 120 were given. She will followup in approximately 4 weeks at that time we will refill her medication. She will notify us if  any problems before then  It should be noted that her extra methadone were put into sharps container for disposal. Alain Honey our nurse witnessed this. This was also witnessed by the patient.

## 2013-07-17 NOTE — Telephone Encounter (Signed)
Discussed information with patient. Patient verbalized understanding.

## 2013-07-24 ENCOUNTER — Other Ambulatory Visit: Payer: Self-pay | Admitting: Family Medicine

## 2013-07-25 NOTE — Telephone Encounter (Signed)
RX called in on voicemail 

## 2013-07-25 NOTE — Telephone Encounter (Signed)
Last office visit was 07/17/13. Last refill on xanax was 04/28/13 with 3 additional refills.

## 2013-07-25 NOTE — Telephone Encounter (Signed)
May ref monthly plus 3 ref

## 2013-07-27 ENCOUNTER — Inpatient Hospital Stay (HOSPITAL_COMMUNITY)
Admission: EM | Admit: 2013-07-27 | Discharge: 2013-07-29 | DRG: 024 | Disposition: A | Discharge status: Home / Self Care | Payer: BC Managed Care – PPO | Attending: Internal Medicine | Admitting: Internal Medicine

## 2013-07-27 ENCOUNTER — Encounter (HOSPITAL_COMMUNITY): Payer: Self-pay | Admitting: Nurse Practitioner

## 2013-07-27 ENCOUNTER — Emergency Department (HOSPITAL_COMMUNITY): Payer: BC Managed Care – PPO

## 2013-07-27 DIAGNOSIS — M542 Cervicalgia: Secondary | ICD-10-CM

## 2013-07-27 DIAGNOSIS — F419 Anxiety disorder, unspecified: Secondary | ICD-10-CM | POA: Diagnosis present

## 2013-07-27 DIAGNOSIS — I2510 Atherosclerotic heart disease of native coronary artery without angina pectoris: Secondary | ICD-10-CM | POA: Diagnosis present

## 2013-07-27 DIAGNOSIS — Z981 Arthrodesis status: Secondary | ICD-10-CM

## 2013-07-27 DIAGNOSIS — Z9119 Patient's noncompliance with other medical treatment and regimen: Secondary | ICD-10-CM

## 2013-07-27 DIAGNOSIS — R569 Unspecified convulsions: Principal | ICD-10-CM | POA: Diagnosis present

## 2013-07-27 DIAGNOSIS — R0600 Dyspnea, unspecified: Secondary | ICD-10-CM

## 2013-07-27 DIAGNOSIS — M5090 Cervical disc disorder, unspecified, unspecified cervical region: Secondary | ICD-10-CM | POA: Diagnosis present

## 2013-07-27 DIAGNOSIS — Z79899 Other long term (current) drug therapy: Secondary | ICD-10-CM

## 2013-07-27 DIAGNOSIS — E039 Hypothyroidism, unspecified: Secondary | ICD-10-CM | POA: Diagnosis present

## 2013-07-27 DIAGNOSIS — Z91199 Patient's noncompliance with other medical treatment and regimen due to unspecified reason: Secondary | ICD-10-CM

## 2013-07-27 DIAGNOSIS — Z8249 Family history of ischemic heart disease and other diseases of the circulatory system: Secondary | ICD-10-CM

## 2013-07-27 DIAGNOSIS — R079 Chest pain, unspecified: Secondary | ICD-10-CM | POA: Diagnosis present

## 2013-07-27 DIAGNOSIS — R9431 Abnormal electrocardiogram [ECG] [EKG]: Secondary | ICD-10-CM | POA: Diagnosis present

## 2013-07-27 DIAGNOSIS — F17200 Nicotine dependence, unspecified, uncomplicated: Secondary | ICD-10-CM

## 2013-07-27 DIAGNOSIS — M1610 Unilateral primary osteoarthritis, unspecified hip: Secondary | ICD-10-CM | POA: Diagnosis present

## 2013-07-27 DIAGNOSIS — R0789 Other chest pain: Secondary | ICD-10-CM | POA: Diagnosis present

## 2013-07-27 DIAGNOSIS — J449 Chronic obstructive pulmonary disease, unspecified: Secondary | ICD-10-CM | POA: Diagnosis present

## 2013-07-27 DIAGNOSIS — F411 Generalized anxiety disorder: Secondary | ICD-10-CM | POA: Diagnosis present

## 2013-07-27 DIAGNOSIS — G894 Chronic pain syndrome: Secondary | ICD-10-CM | POA: Diagnosis present

## 2013-07-27 DIAGNOSIS — J441 Chronic obstructive pulmonary disease with (acute) exacerbation: Secondary | ICD-10-CM

## 2013-07-27 DIAGNOSIS — J4489 Other specified chronic obstructive pulmonary disease: Secondary | ICD-10-CM | POA: Diagnosis present

## 2013-07-27 DIAGNOSIS — I4510 Unspecified right bundle-branch block: Secondary | ICD-10-CM | POA: Diagnosis present

## 2013-07-27 DIAGNOSIS — R32 Unspecified urinary incontinence: Secondary | ICD-10-CM | POA: Diagnosis present

## 2013-07-27 LAB — CBC
Hemoglobin: 15 g/dL (ref 12.0–15.0)
MCH: 32.5 pg (ref 26.0–34.0)
MCHC: 35.8 g/dL (ref 30.0–36.0)
MCV: 90.7 fL (ref 78.0–100.0)

## 2013-07-27 LAB — GLUCOSE, CAPILLARY: Glucose-Capillary: 138 mg/dL — ABNORMAL HIGH (ref 70–99)

## 2013-07-27 LAB — POCT I-STAT TROPONIN I

## 2013-07-27 LAB — BASIC METABOLIC PANEL
BUN: 7 mg/dL (ref 6–23)
CO2: 20 mEq/L (ref 19–32)
Calcium: 9.5 mg/dL (ref 8.4–10.5)
Chloride: 97 mEq/L (ref 96–112)
Creatinine, Ser: 0.72 mg/dL (ref 0.50–1.10)
GFR calc non Af Amer: >90 mL/min (ref >90)
Glucose, Bld: 150 mg/dL — ABNORMAL HIGH (ref 70–99)
Potassium: 3.4 mEq/L — ABNORMAL LOW (ref 3.5–5.1)
Potassium: 3.6 mEq/L (ref 3.5–5.1)

## 2013-07-27 MED ORDER — ONDANSETRON HCL 4 MG/2ML IJ SOLN
4.0000 mg | Freq: Once | INTRAMUSCULAR | Status: AC
  Filled 2013-07-27: qty 2

## 2013-07-27 MED ORDER — LORAZEPAM 2 MG/ML IJ SOLN
INTRAMUSCULAR | Status: AC
  Administered 2013-07-27: 1 mg via INTRAVENOUS
  Filled 2013-07-27: qty 1

## 2013-07-27 MED ORDER — ASPIRIN 81 MG PO CHEW
324.0000 mg | CHEWABLE_TABLET | Freq: Once | ORAL | Status: AC
  Administered 2013-07-27: 324 mg via ORAL
  Filled 2013-07-27: qty 4

## 2013-07-27 MED ORDER — ONDANSETRON HCL 4 MG/2ML IJ SOLN
INTRAMUSCULAR | Status: AC
  Administered 2013-07-27: 4 mg via INTRAVENOUS
  Filled 2013-07-27: qty 2

## 2013-07-27 NOTE — ED Notes (Signed)
CBG is 138. Notified Nurse Zack.

## 2013-07-27 NOTE — ED Notes (Signed)
Ice chips given per Thayer Ohm, RN

## 2013-07-27 NOTE — ED Notes (Signed)
Pt was sitting in chair at triage waiting with family, pt had witnessed onset of seizure activity lasting approx 1-2 mins, family denies any hx of seizures. Pt was assisted into a bed, never fell onto floor.

## 2013-07-27 NOTE — ED Notes (Signed)
Pt reports over past few days she has been having cp, sob, nausea intermittently. Pt reports today she began to feel her heart racing so called ems. ems came to her house and did an ekg and told her it was not normal, pt did not want to be transported to her local hospital so she had her husband drive her here

## 2013-07-27 NOTE — ED Provider Notes (Signed)
CSN: [id number]     Arrival date & time 07/27/13  1820 History     First MD Initiated Contact with Patient 07/27/13 1911     Chief Complaint  Patient presents with  . Chest Pain   (Consider location/radiation/quality/duration/timing/severity/associated sxs/prior Treatment) Patient is a 47 y.o. female presenting with chest pain. The history is provided by the spouse.  Chest Pain Pain location:  L chest Onset quality:  Sudden Duration:  3 hours Timing:  Constant Progression:  Resolved Context: at rest   Relieved by:  None tried Associated symptoms: no headache and no shortness of breath     Past Medical History  Diagnosis Date  . Post concussion syndrome 1999  . 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 Cervical pain (neck)     chronic  . Hypothyroidism   . Anxiety   . Arthritis     right hip   Past Surgical History  Procedure Laterality Date  . Cervix surgery  10/11  . Cardiovascular stress test  03/12    normal  . Tubal ligation Bilateral   . Knee surgery Left 12/89  . Back surgery      cervical fusion   Family History  Problem Relation Age of Onset  . Coronary artery disease Mother 42    MI   History  Substance Use Topics  . Smoking status: Current Every Day Smoker -- 1.00 packs/day for 30 years    Types: Cigarettes  . Smokeless tobacco: Not on file  . Alcohol Use: No   OB History   Grav Para Term Preterm Abortions TAB SAB Ect Mult Living                 Review of Systems  Unable to perform ROS: Mental status change  Respiratory: Negative for shortness of breath.   Cardiovascular: Positive for chest pain.  Neurological: Negative for headaches.    Allergies  Adhesive; Cymbalta; Ivp dye; and Other  Home Medications   Current Outpatient Rx  Name  Route  Sig  Dispense  Refill  . ALPRAZolam (XANAX) 1 MG tablet   Oral   Take 1 tablet (1 mg total) by mouth 2 (two) times daily.   60 tablet   3   . baclofen (LIORESAL) 10 MG tablet   Oral   Take  10 mg by mouth 3 (three) times daily.         . Cholecalciferol (VITAMIN D3) 5000 UNITS CAPS   Oral   Take 1 tablet by mouth daily.         Marland Kitchen levothyroxine (SYNTHROID, LEVOTHROID) 100 MCG tablet   Oral   Take 100 mcg by mouth daily.         . metoprolol succinate (TOPROL-XL) 25 MG 24 hr tablet   Oral   Take 25 mg by mouth daily.         . Oxycodone HCl 10 MG TABS   Oral   Take 1 tablet (10 mg total) by mouth 4 (four) times daily as needed.   120 tablet   0    BP 111/68  Pulse 93  Temp(Src) 98.2 F (36.8 C) (Oral)  SpO2 96% Physical Exam  Nursing note and vitals reviewed. Constitutional: She appears well-developed and well-nourished.  HENT:  Head: Normocephalic and atraumatic.  Right Ear: External ear normal.  Left Ear: External ear normal.  Nose: Nose normal.  Eyes: Right eye exhibits no discharge. Left eye exhibits no discharge.  Cardiovascular: Normal rate, regular rhythm  and normal heart sounds.   Pulmonary/Chest: Effort normal and breath sounds normal.  Abdominal: Soft. There is no tenderness.  Neurological: She is alert. GCS eye subscore is 4. GCS verbal subscore is 4. GCS motor subscore is 6.  Grossly moves all 4 extremities  Skin: Skin is warm and dry.    ED Course   Procedures (including critical care time)  Labs Reviewed  BASIC METABOLIC PANEL - Abnormal; Notable for the following:    Sodium 132 (*)    Potassium 3.4 (*)    Chloride 94 (*)    CO2 13 (*)    Glucose, Bld 150 (*)    All other components within normal limits  CBC - Abnormal; Notable for the following:    WBC 18.0 (*)    All other components within normal limits  GLUCOSE, CAPILLARY - Abnormal; Notable for the following:    Glucose-Capillary 138 (*)    All other components within normal limits  BASIC METABOLIC PANEL - Abnormal; Notable for the following:    Sodium 132 (*)    Glucose, Bld 132 (*)    All other components within normal limits  PRO B NATRIURETIC PEPTIDE  POCT  I-STAT TROPONIN I    Date: 07/28/2013  Rate: 95  Rhythm: normal sinus rhythm  QRS Axis: normal  Intervals: normal  ST/T Wave abnormalities: ST depressions laterally and T wave inversions V3, V4  Conduction Disutrbances:none  Narrative Interpretation: New changes laterally  Old EKG Reviewed: changes noted   Dg Chest 1 View  07/27/2013   *RADIOLOGY REPORT*  Clinical Data: Chest pain  CHEST - 1 VIEW  Comparison: Chest CT May 25, 2013 and chest radiograph May 25, 2013  Findings:  There is underlying emphysema.  There is no edema or consolidation.  The heart size and pulmonary vascularity are normal.  No adenopathy.  No bone lesions.  There is postoperative change in the lower cervical spine.  IMPRESSION: Underlying emphysema.  No edema or consolidation.   Original Report Authenticated By: Bretta Bang, M.D.   Ct Head Wo Contrast  07/27/2013   *RADIOLOGY REPORT*  Clinical Data: Seizure  CT HEAD WITHOUT CONTRAST  Technique:  Contiguous axial images were obtained from the base of the skull through the vertex without contrast. Study was performed within 24 hours of patient arrival at the emergency department.  Comparison: None.  Findings: There is mild generalized atrophy for age.  There is no mass, hemorrhage, extra-axial fluid collection, or midline shift. Gray-white compartments are normal.  No acute infarct is seen. Bony calvarium appears intact.  The mastoid air cells are clear. There is an air-fluid level in the right maxillary antrum.  IMPRESSION: Mild atrophy.  Acute right maxillary sinusitis.  Study otherwise unremarkable.   Original Report Authenticated By: Bretta Bang, M.D.   1. Chest pain at rest   2. Generalized anxiety disorder   3. Seizure   4. Unspecified hypothyroidism     MDM  47 yo female with CP for a few hours PTA. Had a first time, generalized tonic/clonic sz while in triage. Resolved on my arrival into room, given ativan x 1 given her hx of being on xanax (found out  last took yesterday). CT head neg for acute pathology, and no headaches or neck pain or fever to suggest needing an emergent LP. No signs of ETOH w/drawal. Returned to baseline with normal neuro function. GIven her new onset seizure along with CP with new lateral ST-T changes will need admission to hospitalist  for further w/u.   Audree Camel, MD 07/28/13 [phone]

## 2013-07-28 ENCOUNTER — Observation Stay (HOSPITAL_COMMUNITY): Payer: BC Managed Care – PPO

## 2013-07-28 ENCOUNTER — Encounter (HOSPITAL_COMMUNITY): Payer: Self-pay | Admitting: Internal Medicine

## 2013-07-28 DIAGNOSIS — R9431 Abnormal electrocardiogram [ECG] [EKG]: Secondary | ICD-10-CM | POA: Diagnosis present

## 2013-07-28 DIAGNOSIS — R079 Chest pain, unspecified: Secondary | ICD-10-CM

## 2013-07-28 DIAGNOSIS — E039 Hypothyroidism, unspecified: Secondary | ICD-10-CM

## 2013-07-28 DIAGNOSIS — R569 Unspecified convulsions: Secondary | ICD-10-CM | POA: Diagnosis present

## 2013-07-28 DIAGNOSIS — Z8249 Family history of ischemic heart disease and other diseases of the circulatory system: Secondary | ICD-10-CM

## 2013-07-28 DIAGNOSIS — F411 Generalized anxiety disorder: Secondary | ICD-10-CM

## 2013-07-28 DIAGNOSIS — F17200 Nicotine dependence, unspecified, uncomplicated: Secondary | ICD-10-CM

## 2013-07-28 DIAGNOSIS — G894 Chronic pain syndrome: Secondary | ICD-10-CM | POA: Diagnosis present

## 2013-07-28 DIAGNOSIS — F419 Anxiety disorder, unspecified: Secondary | ICD-10-CM | POA: Diagnosis present

## 2013-07-28 LAB — LIPID PANEL
Cholesterol: 220 mg/dL — ABNORMAL HIGH (ref 0–200)
HDL: 29 mg/dL — ABNORMAL LOW (ref >39)
LDL Cholesterol: 157 mg/dL — ABNORMAL HIGH (ref 0–99)
Total CHOL/HDL Ratio: 7.6 RATIO
Triglycerides: 171 mg/dL — ABNORMAL HIGH (ref <150)
VLDL: 34 mg/dL (ref 0–40)

## 2013-07-28 LAB — HEPARIN LEVEL (UNFRACTIONATED)
Heparin Unfractionated: 0.12 IU/mL — ABNORMAL LOW (ref 0.30–0.70)
Heparin Unfractionated: 0.61 IU/mL (ref 0.30–0.70)
Heparin Unfractionated: 0.3 IU/mL (ref 0.30–0.70)

## 2013-07-28 LAB — CBC
HCT: 40.4 % (ref 36.0–46.0)
Hemoglobin: 14.1 g/dL (ref 12.0–15.0)
MCHC: 34.9 g/dL (ref 30.0–36.0)
RBC: 4.52 MIL/uL (ref 3.87–5.11)
WBC: 11.8 10*3/uL — ABNORMAL HIGH (ref 4.0–10.5)

## 2013-07-28 LAB — PROTIME-INR
INR: 1.03 (ref 0.00–1.49)
Prothrombin Time: 13.3 seconds (ref 11.6–15.2)

## 2013-07-28 LAB — APTT: aPTT: 29 seconds (ref 24–37)

## 2013-07-28 LAB — TROPONIN I: Troponin I: <0.3 ng/mL (ref <0.30)

## 2013-07-28 LAB — CREATININE, SERUM: Creatinine, Ser: 0.69 mg/dL (ref 0.50–1.10)

## 2013-07-28 MED ORDER — GADOBENATE DIMEGLUMINE 529 MG/ML IV SOLN
15.0000 mL | Freq: Once | INTRAVENOUS | Status: AC
  Administered 2013-07-28: 15 mL via INTRAVENOUS

## 2013-07-28 MED ORDER — OXYCODONE HCL 5 MG PO TABS
10.0000 mg | ORAL_TABLET | ORAL | Status: DC | PRN
  Administered 2013-07-28 – 2013-07-29 (×2): 10 mg via ORAL
  Filled 2013-07-28: qty 1
  Filled 2013-07-28: qty 2
  Filled 2013-07-28: qty 1

## 2013-07-28 MED ORDER — LORAZEPAM 2 MG/ML IJ SOLN: 1.0000 mg | Freq: Once | INTRAMUSCULAR | Status: AC | PRN

## 2013-07-28 MED ORDER — HEPARIN BOLUS VIA INFUSION
3500.0000 [IU] | Freq: Once | INTRAVENOUS | Status: AC
  Administered 2013-07-28: 3500 [IU] via INTRAVENOUS
  Filled 2013-07-28: qty 3500

## 2013-07-28 MED ORDER — ONDANSETRON HCL 4 MG/2ML IJ SOLN
INTRAMUSCULAR | Status: AC
  Filled 2013-07-28: qty 2

## 2013-07-28 MED ORDER — ONDANSETRON HCL 4 MG/2ML IJ SOLN
4.0000 mg | Freq: Four times a day (QID) | INTRAMUSCULAR | Status: DC | PRN
  Administered 2013-07-28 (×2): 4 mg via INTRAVENOUS
  Filled 2013-07-28: qty 2

## 2013-07-28 MED ORDER — DIPHENHYDRAMINE HCL 50 MG/ML IJ SOLN
25.0000 mg | INTRAMUSCULAR | Status: AC
  Administered 2013-07-29: 25 mg via INTRAVENOUS
  Filled 2013-07-28: qty 1

## 2013-07-28 MED ORDER — SODIUM CHLORIDE 0.9 % IV SOLN
INTRAVENOUS | Status: DC
  Administered 2013-07-28: 21:00:00 via INTRAVENOUS

## 2013-07-28 MED ORDER — HYDROMORPHONE HCL PF 1 MG/ML IJ SOLN
1.0000 mg | INTRAMUSCULAR | Status: DC | PRN
  Administered 2013-07-28 (×2): 1 mg via INTRAVENOUS
  Filled 2013-07-28 (×3): qty 1

## 2013-07-28 MED ORDER — ALPRAZOLAM 0.5 MG PO TABS
1.0000 mg | ORAL_TABLET | Freq: Two times a day (BID) | ORAL | Status: DC
  Administered 2013-07-28 (×3): 1 mg via ORAL
  Filled 2013-07-28: qty 1
  Filled 2013-07-28: qty 2
  Filled 2013-07-28: qty 1
  Filled 2013-07-28: qty 4

## 2013-07-28 MED ORDER — SODIUM CHLORIDE 0.9 % IJ SOLN
3.0000 mL | Freq: Two times a day (BID) | INTRAMUSCULAR | Status: DC
  Administered 2013-07-28: 3 mL via INTRAVENOUS

## 2013-07-28 MED ORDER — DEXTROSE-NACL 5-0.9 % IV SOLN
INTRAVENOUS | Status: DC
  Administered 2013-07-28: 03:00:00 via INTRAVENOUS

## 2013-07-28 MED ORDER — DOCUSATE SODIUM 100 MG PO CAPS
100.0000 mg | ORAL_CAPSULE | Freq: Two times a day (BID) | ORAL | Status: DC
  Administered 2013-07-28: 100 mg via ORAL
  Filled 2013-07-28 (×4): qty 1

## 2013-07-28 MED ORDER — HEPARIN (PORCINE) IN NACL 100-0.45 UNIT/ML-% IJ SOLN
1100.0000 [IU]/h | INTRAMUSCULAR | Status: DC
  Administered 2013-07-28 (×2): 1100 [IU]/h via INTRAVENOUS
  Filled 2013-07-28 (×2): qty 250

## 2013-07-28 MED ORDER — OXYCODONE HCL 10 MG PO TABS: 10.0000 mg | ORAL_TABLET | Freq: Four times a day (QID) | ORAL | Status: DC | PRN

## 2013-07-28 MED ORDER — ASPIRIN 325 MG PO TABS
325.0000 mg | ORAL_TABLET | Freq: Every day | ORAL | Status: DC
  Administered 2013-07-28: 325 mg via ORAL
  Filled 2013-07-28 (×2): qty 1

## 2013-07-28 MED ORDER — OXYCODONE HCL 5 MG PO TABS
10.0000 mg | ORAL_TABLET | Freq: Four times a day (QID) | ORAL | Status: DC | PRN
  Administered 2013-07-28: 10 mg via ORAL
  Filled 2013-07-28: qty 2

## 2013-07-28 MED ORDER — DIAZEPAM 5 MG PO TABS
5.0000 mg | ORAL_TABLET | ORAL | Status: AC
  Administered 2013-07-29: 5 mg via ORAL
  Filled 2013-07-28: qty 1

## 2013-07-28 MED ORDER — HEPARIN (PORCINE) IN NACL 100-0.45 UNIT/ML-% IJ SOLN
850.0000 [IU]/h | INTRAMUSCULAR | Status: DC
  Administered 2013-07-28: 850 [IU]/h via INTRAVENOUS
  Filled 2013-07-28: qty 250

## 2013-07-28 MED ORDER — VITAMIN D3 25 MCG (1000 UNIT) PO TABS
5000.0000 [IU] | ORAL_TABLET | Freq: Every day | ORAL | Status: DC
  Administered 2013-07-28: 5000 [IU] via ORAL
  Filled 2013-07-28 (×2): qty 5

## 2013-07-28 MED ORDER — METOPROLOL SUCCINATE ER 25 MG PO TB24
25.0000 mg | ORAL_TABLET | Freq: Every day | ORAL | Status: DC
  Filled 2013-07-28 (×2): qty 1

## 2013-07-28 MED ORDER — METHYLPREDNISOLONE SODIUM SUCC 125 MG IJ SOLR
125.0000 mg | INTRAMUSCULAR | Status: AC
  Administered 2013-07-29: 125 mg via INTRAVENOUS
  Filled 2013-07-28: qty 2

## 2013-07-28 MED ORDER — LORAZEPAM 2 MG/ML IJ SOLN
INTRAMUSCULAR | Status: AC
  Administered 2013-07-28: 1 mg via INTRAVENOUS
  Filled 2013-07-28: qty 1

## 2013-07-28 MED ORDER — FAMOTIDINE 20 MG PO TABS
20.0000 mg | ORAL_TABLET | ORAL | Status: AC
  Administered 2013-07-28: 20 mg via ORAL
  Filled 2013-07-28: qty 1

## 2013-07-28 MED ORDER — ONDANSETRON HCL 4 MG PO TABS: 4.0000 mg | ORAL_TABLET | Freq: Four times a day (QID) | ORAL | Status: DC | PRN

## 2013-07-28 MED ORDER — HEPARIN BOLUS VIA INFUSION
2000.0000 [IU] | Freq: Once | INTRAVENOUS | Status: AC
  Administered 2013-07-28: 2000 [IU] via INTRAVENOUS
  Filled 2013-07-28: qty 2000

## 2013-07-28 MED ORDER — VITAMIN D3 125 MCG (5000 UT) PO CAPS: 1.0000 | ORAL_CAPSULE | Freq: Every day | ORAL | Status: DC

## 2013-07-28 MED ORDER — FAMOTIDINE IN NACL 20-0.9 MG/50ML-% IV SOLN
20.0000 mg | INTRAVENOUS | Status: AC
  Administered 2013-07-29: 20 mg via INTRAVENOUS
  Filled 2013-07-28: qty 50

## 2013-07-28 MED ORDER — LEVOTHYROXINE SODIUM 100 MCG PO TABS
100.0000 ug | ORAL_TABLET | Freq: Every day | ORAL | Status: DC
  Administered 2013-07-28 – 2013-07-29 (×2): 100 ug via ORAL
  Filled 2013-07-28 (×3): qty 1

## 2013-07-28 NOTE — Progress Notes (Signed)
ANTICOAGULATION CONSULT NOTE - Follow Up Consult  Pharmacy Consult for Heparin Indication: chest pain/ACS  Allergies  Allergen Reactions  . Adhesive [Tape]     Unknown  . Cymbalta [Duloxetine Hcl] Other (See Comments)    Makes my head do crazy things  . Ivp Dye [Iodinated Diagnostic Agents]     Unknown  . Other     Contrast/IV dye    Patient Measurements: Heparin Dosing Weight: 69kg  Vital Signs: Temp: 99 F (37.2 C) (08/18 2129) Temp src: Oral (08/18 2129) BP: 97/64 mmHg (08/18 2129) Pulse Rate: 87 (08/18 2129)  Labs:  Recent Labs  07/27/13 1941 07/27/13 2107 07/28/13 0144 07/28/13 0158 07/28/13 0204 07/28/13 0840 07/28/13 1353 07/28/13 1613 07/28/13 2200  HGB 15.0  --   --   --   --  14.1  --   --   --   HCT 41.9  --   --   --   --  40.4  --   --   --   PLT 356  --   --   --   --  267  --   --   --   APTT  --   --   --   --  29  --   --   --   --   LABPROT  --   --   --   --  13.3  --   --   --   --   INR  --   --   --   --  1.03  --   --   --   --   HEPARINUNFRC  --   --   --   --   --  0.12*  --  0.61 0.30  CREATININE 0.75 0.72 0.69  --   --   --   --   --   --   TROPONINI  --   --   --  <0.30  --  <0.30 <0.30  --   --     The CrCl is unknown because both a height and weight (above a minimum accepted value) are required for this calculation.   Medications:  Heparin @ 1100 units/hr  Assessment: Heparin level at low end of goal 0.30. Trop neg x3. No bleeding complications    Goal of Therapy:  Heparin level 0.3-0.7 units/ml Monitor platelets by anticoagulation protocol: Yes   Plan:  Continue at current rate of 1100/hr and f/u daily

## 2013-07-28 NOTE — H&P (Signed)
[facility] History and Physical  Melanie Shepard JYN:[id number] DOB: [birth year]    PCP:   Melanie Punt, MD    Chief Complaint: chest pain and had a generalized TC seizure in the waiting room.  HPI: Melanie Shepard is an 47 y.o. 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  She was in the waiting room and had a generalized tonic clonic seizure, with bladder incontinence, and slight biting of her lower lip.  Evalaution in the ER included a negative head CT, an EKG that showed ST depression and T wave inversions, different than her baseline EKG, but similar to the previous EKG in July 14.  Hospitalist was asked to admit her for new onset of seizure, and chest pain.  She denied any drug or alcohol use, specifically no cocaine use.  Rewiew of Systems:  Constitutional: Negative for malaise, fever and chills. No significant weight loss or weight gain Eyes: Negative for eye pain, redness and discharge, diplopia, visual changes, or flashes of light. ENMT: Negative for ear pain, hoarseness, nasal congestion, sinus pressure and sore throat. No headaches; tinnitus, drooling, or problem swallowing. Cardiovascular: Negative for  diaphoresis, dyspnea and peripheral edema. ; No orthopnea, PND Respiratory: Negative for cough, hemoptysis, wheezing and stridor. No pleuritic chestpain. Gastrointestinal: Negative for nausea, vomiting, diarrhea, constipation, abdominal pain, melena, blood in stool, hematemesis, jaundice and rectal bleeding.    Genitourinary: Negative for frequency, dysuria, incontinence,flank pain and hematuria; Musculoskeletal: Negative for back pain and neck pain.  Negative for swelling and trauma.;  Skin: . Negative for pruritus, rash, abrasions, bruising and skin lesion.; ulcerations Neuro: Negative for headache, lightheadedness and neck stiffness. Negative for weakness, altered level of consciousness , altered mental status, extremity weakness, burning feet.   Psych: negative for anxiety, depression, insomnia, tearfulness, panic attacks, hallucinations, paranoia, suicidal or homicidal ideation   Past Medical History  Diagnosis Date  . Post concussion syndrome 1999  . 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 Cervical pain (neck)     chronic  . Hypothyroidism   . Anxiety   . Arthritis     right hip    Past Surgical History  Procedure Laterality Date  . Cervix surgery  10/11  . Cardiovascular stress test  03/12    normal  . Tubal ligation Bilateral   . Knee surgery Left 12/89  . Back surgery      cervical fusion    Medications:  HOME MEDS: Prior to Admission medications   Medication Sig Start Date End Date Taking? Authorizing Provider  ALPRAZolam Prudy Feeler) 1 MG tablet Take 1 tablet (1 mg total) by mouth 2 (two) times daily. 04/28/13  Yes Babs Sciara, MD  baclofen (LIORESAL) 10 MG tablet Take 10 mg by mouth 3 (three) times daily.   Yes Historical Provider, MD  Cholecalciferol (VITAMIN D3) 5000 UNITS CAPS Take 1 tablet by mouth daily.   Yes Historical Provider, MD  levothyroxine (SYNTHROID, LEVOTHROID) 100 MCG tablet Take 100 mcg by mouth daily. 02/27/13  Yes Historical Provider, MD  metoprolol succinate (TOPROL-XL) 25 MG 24 hr tablet Take 25 mg by mouth daily.   Yes Historical Provider, MD  Oxycodone HCl 10 MG TABS  Take 1 tablet (10 mg total) by mouth 4 (four) times daily as needed. 07/17/13  Yes Babs Sciara, MD     Allergies:  Allergies  Allergen Reactions  . Adhesive [Tape]     Unknown  . Cymbalta [Duloxetine Hcl] Other (See Comments)    Makes my head do crazy things  . Ivp Dye [Iodinated Diagnostic Agents]     Unknown  .  Other     Contrast/IV dye    Social History:   reports that she has been smoking Cigarettes.  She has a 30 pack-year smoking history. She does not have any smokeless tobacco history on file. She reports that she does not drink alcohol or use illicit drugs.  Family History: Family History  Problem Relation Age of Onset  . Coronary artery disease Mother 59    MI     Physical Exam: Filed Vitals:   07/27/13 1827 07/27/13 2237 07/28/13 0114 07/28/13 0200  BP: 111/68 103/59  94/57  Pulse: 93 79  75  Temp: 98.2 F (36.8 C)  98.3 F (36.8 C) 98.5 F (36.9 C)  TempSrc: Oral     Resp:  16  18  SpO2: 96% 95%  98%   Blood pressure 94/57, pulse 75, temperature 98.5 F (36.9 C), temperature source Oral, resp. rate 18, SpO2 98.00%.  GEN:  Pleasant  patient lying in the stretcher in no acute distress; cooperative with exam. PSYCH:  alert and oriented x4; does not appear anxious or depressed; affect is appropriate. HEENT: Mucous membranes pink and anicteric; PERRLA; EOM intact; no cervical lymphadenopathy nor thyromegaly or carotid bruit; no JVD; There were no stridor. Neck is very supple. Breasts:: Not examined CHEST WALL: No tenderness CHEST: Normal respiration, clear to auscultation bilaterally.  HEART: Regular rate and rhythm.  There are no murmur, rub, or gallops.   BACK: No kyphosis or scoliosis; no CVA tenderness ABDOMEN: soft and non-tender; no masses, no organomegaly, normal abdominal bowel sounds; no pannus; no intertriginous candida. There is no rebound and no distention. Rectal Exam: Not done EXTREMITIES: No bone or joint deformity; age-appropriate arthropathy of the hands and knees; no edema; no ulcerations.  There is no calf tenderness. Genitalia: not examined PULSES: 2+ and symmetric SKIN: Normal hydration no rash or ulceration CNS: Cranial nerves 2-12 grossly intact no focal lateralizing neurologic deficit.  Speech is fluent; uvula elevated with phonation, facial symmetry  and tongue midline. DTR are normal bilaterally, cerebella exam is intact, barbinski is negative and strengths are equaled bilaterally.  No sensory loss.   Labs on Admission:  Basic Metabolic Panel:  Recent Labs Lab 07/27/13 1941 07/27/13 2107 07/28/13 0144  NA 132* 132*  --   K 3.4* 3.6  --   CL 94* 97  --   CO2 13* 20  --   GLUCOSE 150* 132*  --   BUN 7 8  --   CREATININE 0.75 0.72 0.69  CALCIUM 9.5 9.6  --    Liver Function Tests: No results found for this basename: AST, ALT, ALKPHOS, BILITOT, PROT, ALBUMIN,  in the last 168 hours No results found for this basename: LIPASE, AMYLASE,  in the last 168 hours No results found for this basename: AMMONIA,  in the last 168 hours CBC:  Recent Labs Lab 07/27/13 1941  WBC 18.0*  HGB 15.0  HCT 41.9  MCV 90.7  PLT 356   Cardiac Enzymes:  Recent Labs Lab 07/28/13 0158  TROPONINI <0.30    CBG:  Recent Labs  Lab 07/27/13 1907  GLUCAP 138*     Radiological Exams on Admission: Dg Chest 1 View  07/27/2013   *RADIOLOGY REPORT*  Clinical Data: Chest pain  CHEST - 1 VIEW  Comparison: Chest CT May 25, 2013 and chest radiograph May 25, 2013  Findings:  There is underlying emphysema.  There is no edema or consolidation.  The heart size and pulmonary vascularity are normal.  No adenopathy.  No bone lesions.  There is postoperative change in the lower cervical spine.  IMPRESSION: Underlying emphysema.  No edema or consolidation.   Original Report Authenticated By: Bretta Bang, M.D.   Ct Head Wo Contrast  07/27/2013   *RADIOLOGY REPORT*  Clinical Data: Seizure  CT HEAD WITHOUT CONTRAST  Technique:  Contiguous axial images were obtained from the base of the skull through the vertex without contrast. Study was performed within 24 hours of patient arrival at the emergency department.  Comparison: None.  Findings: There is mild generalized atrophy for age.  There is no mass, hemorrhage, extra-axial fluid collection, or midline shift.  Gray-white compartments are normal.  No acute infarct is seen. Bony calvarium appears intact.  The mastoid air cells are clear. There is an air-fluid level in the right maxillary antrum.  IMPRESSION: Mild atrophy.  Acute right maxillary sinusitis.  Study otherwise unremarkable.   Original Report Authenticated By: Bretta Bang, M.D.    EKG: Independently reviewed. Significant ST-T changes, T wave inversion.  No change from EKG in July 14.   Assessment/Plan Present on Admission:  . Chest pain at rest . Generalized anxiety disorder . Unspecified hypothyroidism . Seizure . Chronic pain disorder  PLAN:  This nice patient presents to the ER with new onset of seizure in the setting of prior severe brain concussions many years ago, in the process of switching her methadone back to hydrocodone, and missed her benzodiapenes.  I wonder if changing her meds and withdrawing her benzo had precipitated her seizure. Will admit her to telemetry and obtain an MRI with and without contrast of the brain along with an EEG.  I have not started her on any ACD at this time, but will continue her on her benzo.   With respect to the chest pain, she had a recent nuclear stress test which was negative, and I wonder if she has coronary spasm.  Cardiology has been consulted, and will cycle her troponin, along with obtaining an ECHO tomorrow.  Will have her NPO both for ? Further cardiac procedure, and because she just had a seizure tonight.  She is stable, full code, and will be admitted to [facility] service.  Thank you for allowing me to participate in her care.  Other plans as per orders.  Code Status: FULL Unk Lightning, MD. [facility] Pager [phone] 7pm to 7am.  07/28/2013, 2:50 AM

## 2013-07-28 NOTE — Progress Notes (Signed)
Pt continues to be hateful to staff members. Have been educated not to leave floor, yet cont to leave unit. Smells like smoke in room at times, ? Smoking. Notified Dr. Jerral Ralph of previous. Emelda Brothers RN

## 2013-07-28 NOTE — Progress Notes (Signed)
ANTICOAGULATION CONSULT NOTE - Initial Consult  Pharmacy Consult for Heparin Indication: chest pain/ACS  Allergies  Allergen Reactions  . Adhesive [Tape]     Unknown  . Cymbalta [Duloxetine Hcl] Other (See Comments)    Makes my head do crazy things  . Ivp Dye [Iodinated Diagnostic Agents]     Unknown  . Other     Contrast/IV dye    Patient Measurements:  Weight: 68.6kg Height: 67 in Heparin Dosing Weight: 68.6kg  Vital Signs: Temp: 98.3 F (36.8 C) (08/18 0114) Temp src: Oral (08/17 1827) BP: 103/59 mmHg (08/17 2237) Pulse Rate: 79 (08/17 2237)  Labs:  Recent Labs  07/27/13 1941 07/27/13 2107  HGB 15.0  --   HCT 41.9  --   PLT 356  --   CREATININE 0.75 0.72    The CrCl is unknown because both a height and weight (above a minimum accepted value) are required for this calculation.   Medical History: Past Medical History  Diagnosis Date  . Post concussion syndrome 1999  . 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 Cervical pain (neck)     chronic  . Hypothyroidism   . Anxiety   . Arthritis     right hip    Medications:  Prescriptions prior to admission  Medication Sig Dispense Refill  . ALPRAZolam (XANAX) 1 MG tablet Take 1 tablet (1 mg total) by mouth 2 (two) times daily.  60 tablet  3  . baclofen (LIORESAL) 10 MG tablet Take 10 mg by mouth 3 (three) times daily.      . Cholecalciferol (VITAMIN D3) 5000 UNITS CAPS Take 1 tablet by mouth daily.      Marland Kitchen levothyroxine (SYNTHROID, LEVOTHROID) 100 MCG tablet Take 100 mcg by mouth daily.      . metoprolol succinate (TOPROL-XL) 25 MG 24 hr tablet Take 25 mg by mouth daily.      . Oxycodone HCl 10 MG TABS Take 1 tablet (10 mg total) by mouth 4 (four) times daily as needed.  120 tablet  0    Assessment: 47yof to start heparin for CP/ACS. Patient reports no bleeding and not on any anticoagulants PTA. - Baseline INR ordered - H/H and Plts wnl - Heparin weight: 68.6kg - CT head: no acute abnormalities  Goal of  Therapy:  Heparin level 0.3-0.7 units/ml Monitor platelets by anticoagulation protocol: Yes   Plan:  1. Heparin IV bolus 3500 units x 1 2. Heparin drip 850 units/hr (8.5 ml/hr) 3. Check heparin level 6 hours after initiation 4. Daily heparin level and CBC  Cleon Dew [phone] 07/28/2013,1:49 AM

## 2013-07-28 NOTE — ED Notes (Signed)
Le MD at bedside. 

## 2013-07-28 NOTE — Progress Notes (Addendum)
Subjective:  No chest pain this am.  Objective:  Vital Signs in the last 24 hours: Temp:  [98.2 F (36.8 C)-98.5 F (36.9 C)] 98.3 F (36.8 C) (08/18 0810) Pulse Rate:  [75-93] 81 (08/18 0810) Resp:  [16-18] 18 (08/18 0200) BP: (94-111)/(53-68) 94/53 mmHg (08/18 0810) SpO2:  [95 %-98 %] 98 % (08/18 0810)  Intake/Output from previous day: No intake or output data in the 24 hours ending 07/28/13 0918  Physical Exam: General appearance: alert, cooperative and no distress Lungs: few rhonchi Heart: regular rate and rhythm   Rate: 80  Rhythm: normal sinus rhythm  Lab Results:  Recent Labs  07/27/13 1941  WBC 18.0*  HGB 15.0  PLT 356    Recent Labs  07/27/13 1941 07/27/13 2107 07/28/13 0144  NA 132* 132*  --   K 3.4* 3.6  --   CL 94* 97  --   CO2 13* 20  --   GLUCOSE 150* 132*  --   BUN 7 8  --   CREATININE 0.75 0.72 0.69    Recent Labs  07/28/13 0158  TROPONINI <0.30   Hepatic Function Panel No results found for this basename: PROT, ALBUMIN, AST, ALT, ALKPHOS, BILITOT, BILIDIR, IBILI,  in the last 72 hours No results found for this basename: CHOL,  in the last 72 hours  Recent Labs  07/28/13 0204  INR 1.03    Imaging: Imaging results have been reviewed  Cardiac Studies:  Assessment/Plan:   Principal Problem:   Seizure Active Problems:   Generalized anxiety disorder   Unspecified hypothyroidism   Dyspnea   Chest pain at rest   Chronic pain disorder    PLAN: This is a 47 y.o. female with a past medical history significant for chronic neck, back pain after C-spine surgery (?2011). She also has a history of severe head trauma 12 years ago requiring tracheostomy and prolonged hospitalization which she recovered from. She has had a Myoview in 2012 which was negative. She was seen in June 2014 with chest pain that we did not feel was cardiac. A Myoview done 07/09/13 was negative for ischemia. Dr Alanda Amass follows her and notes that she has multiple  cardiac risk factors including smoking, strong family history, and an abnormal EKG. He indicated in his last note that he would have a low threshold for cath.         The pt was admitted to the ER last night after she developed tachycardia and chest pain at home. This was new for her. EMS arrived and her EKG was "not normal", she has a baseline abnormal EKG. She did not want to go to [place] so she declined EMS transport and came to [facility]. In the ER waiting room she had a witnessed seizure. She has no prior history of seizures. MD to see, consider cath when cleared neurologically. She is for MRI today.  Corine Shelter PA-C Beeper [phone] 07/28/2013, 9:18 AM  I have seen and evaluated the patient this AM along with Corine Shelter, PA. I agree with his findings, examination as well as impression recommendations.  Difficult scenario -- pt known to Dr. Alanda Amass, multiple Cardiac RFs & abnormal ECG, but with non-ischemic Myoview 3 weeks ago for that ECG & CP.  She now presents yet again with SSCP & rapid HR.  I think the only way to definitively evaluate her CP is with cardiac cath.  Unfortunately, she had a seizure while in the ER waiting room & is currently being evaluated  by Neurology - MRI & EEG.   I think that in the interest of safety, I would like to have the Neurologic exam completed & minimal risk for Seizure while on the cath table.  She has r/o for MI & has not had any further pain, so she is not of particularly high risk.  She would need pre-medication for IVP contrast allergy as well.  Her exam is essentially benign, but she is quite demanding of anxiolytics and pain medications to the staff -- I am concerned about her medical adherence if PCI is considered.  There is even concern re: smoking in her room -- I spent at least 12-15 minutes discussing smoking cessation with options for assistance being nicoderm patch as best option.  I do not think that Chantix would be a good idea for her.  I discussed  the options an alternatives with her & told her Dr. Kandis Cocking impressions & plan -- "low threshold to proceed with cardiac cath".  I discussed the procedure in detail with her & her husband & she agrees to proceed --> will plan for [facility] +/- Cath tomorrow, provided that the Neurology service as addressed the issue of Siezure.    MD Time with pt: 20 min  HARDING,DAVID W, M.D., M.S. [facility] [address] [place], [place]  [address]  [phone] Pager # [phone] 07/28/2013 9:42 AM

## 2013-07-28 NOTE — Progress Notes (Signed)
ANTICOAGULATION CONSULT NOTE - Follow Up Consult  Pharmacy Consult for Heparin Indication: chest pain/ACS  Allergies  Allergen Reactions  . Adhesive [Tape]     Unknown  . Cymbalta [Duloxetine Hcl] Other (See Comments)    Makes my head do crazy things  . Ivp Dye [Iodinated Diagnostic Agents]     Unknown  . Other     Contrast/IV dye    Patient Measurements: Heparin Dosing Weight: 69kg  Vital Signs: Temp: 98.3 F (36.8 C) (08/18 0810) BP: 94/53 mmHg (08/18 0810) Pulse Rate: 81 (08/18 0810)  Labs:  Recent Labs  07/27/13 1941 07/27/13 2107 07/28/13 0144 07/28/13 0158 07/28/13 0204 07/28/13 0840 07/28/13 1353 07/28/13 1613  HGB 15.0  --   --   --   --  14.1  --   --   HCT 41.9  --   --   --   --  40.4  --   --   PLT 356  --   --   --   --  267  --   --   APTT  --   --   --   --  29  --   --   --   LABPROT  --   --   --   --  13.3  --   --   --   INR  --   --   --   --  1.03  --   --   --   HEPARINUNFRC  --   --   --   --   --  0.12*  --  0.61  CREATININE 0.75 0.72 0.69  --   --   --   --   --   TROPONINI  --   --   --  <0.30  --  <0.30 <0.30  --     The CrCl is unknown because both a height and weight (above a minimum accepted value) are required for this calculation.   Medications:  Heparin @ 1100 units/hr  Assessment: 47yof continues on heparin for CP, arrhythmia vs [place]. Troponins negative x 2. Initial heparin level is subtherapeutic. CBC is stable. Per cardiology note, low threshold for cath but may consider when cleared neurologically.  Follow up heparin level is within goal (0.61). No bleeding complications noted. Will continue at current rate and check confirmatory level.  Goal of Therapy:  Heparin level 0.3-0.7 units/ml Monitor platelets by anticoagulation protocol: Yes   Plan:  1) Increase heparin drip to 1100 units/hr 2) Check another 6 hour heparin level  Sheppard Coil PharmD., BCPS Clinical Pharmacist Pager [phone] 07/28/2013 5:11 PM

## 2013-07-28 NOTE — Consult Note (Signed)
Cardiology Consult Note  Admit date: 07/27/2013 Name: Melanie Shepard 47 y.o.  female DOB:  23-Mar-1966 MRN:  [id number]  Today's date:  07/28/2013  Referring Physician:    [facility]  Primary Physician:   Dr. Lilyan Punt  Reason for Consultation:    Chest pain  IMPRESSIONS: 1. Recurrent chest discomfort that may have been due to an arrhythmia or could be unstable angina 2. History of recurrent chest pain with some typical other atypical features with a recent negative myocardial perfusion scan 3. Chronic pain syndrome 4. Cervical disc disease 5. Hiatal hernia 6. COPD with ongoing tobacco abuse 7. Anxiety 8. Hypothyroidism 9. Possible new onset seizure 10. Coronary artery disease as manifested by calcifications in the LAD on a previous CT scan  RECOMMENDATION: The patient has labile T-wave changes in the anterior leads in the month of July. She had a negative myocardial perfusion scan done recently but persists with recurrent chest pain as well as a new seizure. I would suggest that she have serial cardiac enzymes and repeat EKG in the morning. She does have smoking and did have calcification in her LAD on a previous CT scan. I will keep her n.p.o. after midnight in case additional testing needs to be done. She does have an allergy to IVP dye. Repeat echocardiogram  HISTORY: This 47 year old female has a history of COPD, anxiety, hypothyroidism and has chronic neck pain after cervical spine surgery. She lives in [place] and had admission to the hospital there with chest pain. She was later admitted to the hospital here in June and had an echocardiogram showing a small effusion. A CT angiogram showed a mild amount of calcification the LAD and possible pneumonia at the bases. She was treated with antibiotics. Since that time she's continued to have chest pain and saw Dr. Alanda Amass on 7/10 with new T wave inversions in the anterior leads. She was scheduled to have a  nuclear perfusion scan and this was done on 7/30 and showed normal myocardial perfusion at the time.  She felt poorly and has been changed from methadone to oxycodone. She is continued to smoke cigarettes. She developed tachycardia and then developed anterior chest pain that she described as pressure and was not pleuritic this evening. EMS came to her house but she did not wish to go to the [facility] so her husband drove her here to [place] earlier. While in the waiting room she evidently had a witnessed grand mal seizure described as stiffening with her head rolled back and shaking and she was later brought back. A CT scan showed some mild atrophy as well as sinusitis. She is not currently having chest discomfort. An EKG showed an incomplete right bundle branch block and significant ST depression as well as T-wave inversions in the 3 and V4 as well as some mild ST depression in the inferior leads. Initial troponin was negative.  Past Medical History  Diagnosis Date  . Post concussion syndrome 1999  . 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 Cervical pain (neck)     chronic  . Hypothyroidism   . Anxiety   . Arthritis     right hip      Past Surgical History  Procedure Laterality Date  . Cervix surgery  10/11  . Cardiovascular stress test  03/12    normal  . Tubal ligation Bilateral   . Knee surgery Left 12/89  . Back surgery      cervical fusion  Allergies:  is allergic to adhesive; cymbalta; ivp dye; and other.   Medications: Prior to Admission medications   Medication Sig Start Date End Date Taking? Authorizing Provider  ALPRAZolam Prudy Feeler) 1 MG tablet Take 1 tablet (1 mg total) by mouth 2 (two) times daily. 04/28/13  Yes Babs Sciara, MD  baclofen (LIORESAL) 10 MG tablet Take 10 mg by mouth 3 (three) times daily.   Yes Historical Provider, MD  Cholecalciferol (VITAMIN D3) 5000 UNITS CAPS Take 1 tablet by mouth daily.   Yes Historical Provider, MD  levothyroxine  (SYNTHROID, LEVOTHROID) 100 MCG tablet Take 100 mcg by mouth daily. 02/27/13  Yes Historical Provider, MD  metoprolol succinate (TOPROL-XL) 25 MG 24 hr tablet Take 25 mg by mouth daily.   Yes Historical Provider, MD  Oxycodone HCl 10 MG TABS Take 1 tablet (10 mg total) by mouth 4 (four) times daily as needed. 07/17/13  Yes Babs Sciara, MD    Family History: No family status information on file.    Social History:   reports that she has been smoking Cigarettes.  She has a 30 pack-year smoking history. She does not have any smokeless tobacco history on file. She reports that she does not drink alcohol or use illicit drugs.   History   Social History Narrative  . No narrative on file    Review of Systems: She sleeps poorly and has anxiety. She has a chronic pain disorder was recently switched from methadone to oxycodone. She has a moderate amount of dyspnea continues to smoke. She was admitted previously with pleuritic chest pain this is a different chest pain.  Physical Exam: BP 103/59  Pulse 79  Temp(Src) 98.2 F (36.8 C) (Oral)  Resp 16  SpO2 95%  General appearance: Depressed appearing white female who is a poor historian Head: Normocephalic, without obvious abnormality, atraumatic Eyes: negative Neck: no adenopathy, no carotid bruit, no JVD and supple, symmetrical, trachea midline Lungs: clear to auscultation bilaterally Heart: regular rate and rhythm, S1, S2 normal, no murmur, click, rub or gallop Abdomen: soft, non-tender; bowel sounds normal; no masses,  no organomegaly Pelvic: deferred Extremities: extremities normal, atraumatic, no cyanosis or edema Pulses: 2+ and symmetric Skin: Skin color, texture, turgor normal. No rashes or lesions Neurologic: Grossly normal   Labs: CBC  Recent Labs  07/27/13 1941  WBC 18.0*  RBC 4.62  HGB 15.0  HCT 41.9  PLT 356  MCV 90.7  MCH 32.5  MCHC 35.8  RDW 14.5   CMP   Recent Labs  07/27/13 2107  NA 132*  K 3.6  CL  97  CO2 20  GLUCOSE 132*  BUN 8  CREATININE 0.72  CALCIUM 9.6  GFRNONAA >90  GFRAA >90   BNP (last 3 results)  Recent Labs  07/27/13 1941  PROBNP 80.7   Cardiac Panel (last 3 results) Troponin (Point of Care Test)  Recent Labs  07/27/13 2003  TROPIPOC 0.02     Radiology: Emphysema, normal heart size  EKG: Incomplete right bundle branch block, T-wave inversions in leads V3 and V4 with some slight ST depression in the inferior leads. The previous EKG of July 10 also showed some anterior T-wave inversions consistent with ischemia. EKGs prior to this did not show these changes.  Signed:  Darden Palmer MD [facility]   Cardiology Consultant  07/28/2013, 12:47 AM

## 2013-07-28 NOTE — Consult Note (Signed)
NEURO HOSPITALIST CONSULT NOTE    Reason for Consult: new onset seizure.  HPI:                                                                                                                                          Melanie Shepard is an 47 y.o. female with a past medical history significant for anxiety on chronic xanax, head trauma 1999 (said that she was in a coma for 4 weeks at [facility]), hypothyroidism, chronic neck pain, admitted to [facility] after sustaining a GTC seizure while in the ED waiting room.  She has hx of chronic pain previously on chronic methadone, anxiety on Xanax at 1mg  BID, tapered off methadone about a week ago, resuming hydrocodone 10mg  about 4-5 tablets per day, missed her Xanax the morning of admission. Melanie Shepard said that she has no recollection of having a seizure, and denies experiencing a warning that she was about to have a convulsion.  Her husband stated that she had generalized jerking movements for a couple of minutes and afterwards she was confused and disoriented. Did bite her tongue and had bladder incontinence. CT brain showed essentially unremarkable. Urine drug screen not performed. Denies ever experiencing spells concerning for seizures. Also denies recent fever, infection, or ETOH intake. At this moment, denies headache, vertigo, double vision, difficulty swallowing, slurred speech, language or vision impairment.  No history of febrile seizures, CNS infection, stroke. No family history of epilepsy. She was born full term, product of a normal pregnancy, no neonatal/perinatal complications. Normal development.   Past Medical History  Diagnosis Date  . Post concussion syndrome 1999  . 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 Cervical pain (neck)     chronic  . Hypothyroidism   . Anxiety   . Arthritis     right hip    Past Surgical History  Procedure Laterality Date  . Cervix surgery  10/11  . Cardiovascular stress test   03/12    normal  . Tubal ligation Bilateral   . Knee surgery Left 12/89  . Back surgery      cervical fusion    Family History  Problem Relation Age of Onset  . Coronary artery disease Mother 41    MI    Family History: no epilepsy.   Social History:  reports that she has been smoking Cigarettes.  She has a 30 pack-year smoking history. She does not have any smokeless tobacco history on file. She reports that she does not drink alcohol or use illicit drugs.  Allergies  Allergen Reactions  . Adhesive [Tape]     Unknown  . Cymbalta [Duloxetine Hcl] Other (See Comments)    Makes my head do crazy things  . Ivp Dye [Iodinated Diagnostic Agents]  Unknown  . Other     Contrast/IV dye    MEDICATIONS:                                                                                                                     I have reviewed the patient's current medications.   ROS:                                                                                                                                       History obtained from the patient, husband, and chart review.  General ROS: negative for - chills, fatigue, fever, night sweats, weight gain or weight loss Psychological ROS: negative for - behavioral disorder, hallucinations, memory difficulties. Ophthalmic ROS: negative for - blurry vision, double vision, eye pain or loss of vision ENT ROS: negative for - epistaxis, nasal discharge, oral lesions, sore throat, tinnitus or vertigo Allergy and Immunology ROS: negative for - hives or itchy/watery eyes Hematological and Lymphatic ROS: negative for - bleeding problems, bruising or swollen lymph nodes Endocrine ROS: negative for - galactorrhea, hair pattern changes, polydipsia/polyuria or temperature intolerance Respiratory ROS: negative for - cough, hemoptysis, shortness of breath or wheezing Cardiovascular ROS: negative for - chest pain, dyspnea on exertion, edema or irregular  heartbeat Gastrointestinal ROS: negative for - abdominal pain, diarrhea, hematemesis, nausea/vomiting or stool incontinence Genito-Urinary ROS: negative for - dysuria, hematuria, incontinence or urinary frequency/urgency Musculoskeletal ROS: negative for - joint swelling or muscular weakness Neurological ROS: as noted in HPI Dermatological ROS: negative for rash and skin lesion changes      Physical exam: pleasant female in no apparent distress. Blood pressure 94/53, pulse 81, temperature 98.3 F (36.8 C), temperature source Oral, resp. rate 18, SpO2 98.00%. Head: normocephalic. Neck: supple, no bruits, no JVD. Cardiac: no murmurs. Lungs: clear. Abdomen: soft, no tender, no mass. Extremities: no edema.    Neurologic Examination:  Mental Status: Alert, awake, oriented x 4, thought content appropriate.  Comprehension, naming, and repetition intact. Speech fluent without evidence of aphasia.  Able to follow 3 step commands without difficulty. Cranial Nerves: II: Discs flat bilaterally; Visual fields grossly normal, pupils equal, round, reactive to light and accommodation III,IV, VI: ptosis not present, extra-ocular motions intact bilaterally V,VII: smile symmetric, facial light touch sensation normal bilaterally VIII: hearing normal bilaterally IX,X: gag reflex present XI: bilateral shoulder shrug XII: midline tongue extension Motor: Right : Upper extremity   5/5    Left:     Upper extremity   5/5  Lower extremity   5/5     Lower extremity   5/5 Tone and bulk:normal tone throughout; no atrophy noted Sensory: Pinprick and light touch intact throughout, bilaterally Deep Tendon Reflexes:  1+ all over. Plantars: Right: downgoing   Left: downgoing Cerebellar: normal finger-to-nose,  normal heel-to-shin test Gait:  No tested. CV: pulses palpable throughout    No results found  for this basename: cbc, bmp, coags, chol, tri, ldl, hga1c    Results for orders placed during the hospital encounter of 07/27/13 (from the past 48 hour(s))  GLUCOSE, CAPILLARY     Status: Abnormal   Collection Time    07/27/13  7:07 PM      Result Value Range   Glucose-Capillary 138 (*) 70 - 99 mg/dL   Comment 1 Documented in Chart     Comment 2 Notify RN    PRO B NATRIURETIC PEPTIDE     Status: None   Collection Time    07/27/13  7:41 PM      Result Value Range   Pro B Natriuretic peptide (BNP) 80.7  0 - 125 pg/mL  BASIC METABOLIC PANEL     Status: Abnormal   Collection Time    07/27/13  7:41 PM      Result Value Range   Sodium 132 (*) 135 - 145 mEq/L   Potassium 3.4 (*) 3.5 - 5.1 mEq/L   Chloride 94 (*) 96 - 112 mEq/L   CO2 13 (*) 19 - 32 mEq/L   Glucose, Bld 150 (*) 70 - 99 mg/dL   BUN 7  6 - 23 mg/dL   Creatinine, Ser 9.60  0.50 - 1.10 mg/dL   Calcium 9.5  8.4 - 45.4 mg/dL   GFR calc non Af Amer >90  >90 mL/min   GFR calc Af Amer >90  >90 mL/min   Comment: (NOTE)     The eGFR has been calculated using the CKD EPI equation.     This calculation has not been validated in all clinical situations.     eGFR's persistently <90 mL/min signify possible Chronic Kidney     Disease.  CBC     Status: Abnormal   Collection Time    07/27/13  7:41 PM      Result Value Range   WBC 18.0 (*) 4.0 - 10.5 K/uL   RBC 4.62  3.87 - 5.11 MIL/uL   Hemoglobin 15.0  12.0 - 15.0 g/dL   HCT 09.8  11.9 - 14.7 %   MCV 90.7  78.0 - 100.0 fL   MCH 32.5  26.0 - 34.0 pg   MCHC 35.8  30.0 - 36.0 g/dL   RDW 82.9  56.2 - 13.0 %   Platelets 356  150 - 400 K/uL  POCT I-STAT TROPONIN I     Status: None   Collection Time    07/27/13  8:03 PM  Result Value Range   Troponin i, poc 0.02  0.00 - 0.08 ng/mL   Comment 3            Comment: Due to the release kinetics of cTnI,     a negative result within the first hours     of the onset of symptoms does not rule out     myocardial infarction with  certainty.     If myocardial infarction is still suspected,     repeat the test at appropriate intervals.  BASIC METABOLIC PANEL     Status: Abnormal   Collection Time    07/27/13  9:07 PM      Result Value Range   Sodium 132 (*) 135 - 145 mEq/L   Potassium 3.6  3.5 - 5.1 mEq/L   Chloride 97  96 - 112 mEq/L   CO2 20  19 - 32 mEq/L   Glucose, Bld 132 (*) 70 - 99 mg/dL   BUN 8  6 - 23 mg/dL   Creatinine, Ser 2.95  0.50 - 1.10 mg/dL   Calcium 9.6  8.4 - 28.4 mg/dL   GFR calc non Af Amer >90  >90 mL/min   GFR calc Af Amer >90  >90 mL/min   Comment: (NOTE)     The eGFR has been calculated using the CKD EPI equation.     This calculation has not been validated in all clinical situations.     eGFR's persistently <90 mL/min signify possible Chronic Kidney     Disease.  CREATININE, SERUM     Status: None   Collection Time    07/28/13  1:44 AM      Result Value Range   Creatinine, Ser 0.69  0.50 - 1.10 mg/dL   GFR calc non Af Amer >90  >90 mL/min   GFR calc Af Amer >90  >90 mL/min   Comment: (NOTE)     The eGFR has been calculated using the CKD EPI equation.     This calculation has not been validated in all clinical situations.     eGFR's persistently <90 mL/min signify possible Chronic Kidney     Disease.  TROPONIN I     Status: None   Collection Time    07/28/13  1:58 AM      Result Value Range   Troponin I <0.30  <0.30 ng/mL   Comment:            Due to the release kinetics of cTnI,     a negative result within the first hours     of the onset of symptoms does not rule out     myocardial infarction with certainty.     If myocardial infarction is still suspected,     repeat the test at appropriate intervals.  APTT     Status: None   Collection Time    07/28/13  2:04 AM      Result Value Range   aPTT 29  24 - 37 seconds  PROTIME-INR     Status: None   Collection Time    07/28/13  2:04 AM      Result Value Range   Prothrombin Time 13.3  11.6 - 15.2 seconds   INR 1.03   0.00 - 1.49    Dg Chest 1 View  07/27/2013   *RADIOLOGY REPORT*  Clinical Data: Chest pain  CHEST - 1 VIEW  Comparison: Chest CT May 25, 2013 and chest radiograph May 25, 2013  Findings:  There is underlying emphysema.  There is no edema or consolidation.  The heart size and pulmonary vascularity are normal.  No adenopathy.  No bone lesions.  There is postoperative change in the lower cervical spine.  IMPRESSION: Underlying emphysema.  No edema or consolidation.   Original Report Authenticated By: Bretta Bang, M.D.   Ct Head Wo Contrast  07/27/2013   *RADIOLOGY REPORT*  Clinical Data: Seizure  CT HEAD WITHOUT CONTRAST  Technique:  Contiguous axial images were obtained from the base of the skull through the vertex without contrast. Study was performed within 24 hours of patient arrival at the emergency department.  Comparison: None.  Findings: There is mild generalized atrophy for age.  There is no mass, hemorrhage, extra-axial fluid collection, or midline shift. Gray-white compartments are normal.  No acute infarct is seen. Bony calvarium appears intact.  The mastoid air cells are clear. There is an air-fluid level in the right maxillary antrum.  IMPRESSION: Mild atrophy.  Acute right maxillary sinusitis.  Study otherwise unremarkable.   Original Report Authenticated By: Bretta Bang, M.D.     Assessment/Plan: 47 y/o with new onset isolated, witnessed GTC seizure. Non focal neuro-exam and unremarkable CT brain. Probable provoked benzodiazepine withdrawal seizure, but can not exclude the possibility of new onset late symptomatic post traumatic seizure, as she reports a history of head trauma that caused her to be on a comatose state for a month (surprisingly, CT brain showed no evidence of old skull fracture or post traumatic encephalomalacia). Will defer starting anticonvulsant, pending results of EEG and MRI brain. Will follow up.  Wyatt Portela, MD [facility] [phone]  07/28/2013, 8:21 AM

## 2013-07-28 NOTE — Progress Notes (Signed)
Echocardiogram 2D Echocardiogram has been performed.  Dorothey Baseman 07/28/2013, 4:15 PM

## 2013-07-28 NOTE — Progress Notes (Signed)
EEG Completed; Results Pending  

## 2013-07-28 NOTE — Progress Notes (Signed)
Patient refused EEG. Tech went into room to perform EEG explained process and patient started cussing at tech and was very rude. She stated that she was not having this test done. Tech informed nurse of what was said and nurse stated to cancel order per patient refusal.

## 2013-07-28 NOTE — Progress Notes (Signed)
ANTICOAGULATION CONSULT NOTE - Follow Up Consult  Pharmacy Consult for Heparin Indication: chest pain/ACS  Allergies  Allergen Reactions  . Adhesive [Tape]     Unknown  . Cymbalta [Duloxetine Hcl] Other (See Comments)    Makes my head do crazy things  . Ivp Dye [Iodinated Diagnostic Agents]     Unknown  . Other     Contrast/IV dye    Patient Measurements:   Heparin Dosing Weight: 69kg  Vital Signs: Temp: 98.3 F (36.8 C) (08/18 0810) BP: 94/53 mmHg (08/18 0810) Pulse Rate: 81 (08/18 0810)  Labs:  Recent Labs  07/27/13 1941 07/27/13 2107 07/28/13 0144 07/28/13 0158 07/28/13 0204 07/28/13 0840  HGB 15.0  --   --   --   --  14.1  HCT 41.9  --   --   --   --  40.4  PLT 356  --   --   --   --  267  APTT  --   --   --   --  29  --   LABPROT  --   --   --   --  13.3  --   INR  --   --   --   --  1.03  --   HEPARINUNFRC  --   --   --   --   --  0.12*  CREATININE 0.75 0.72 0.69  --   --   --   TROPONINI  --   --   --  <0.30  --  <0.30    The CrCl is unknown because both a height and weight (above a minimum accepted value) are required for this calculation.   Medications:  Heparin @ 850 units/hr  Assessment: 47yof continues on heparin for CP, arrhythmia vs [place]. Troponins negative x 2. Initial heparin level is subtherapeutic. CBC is stable. No bleeding reported. Per cardiology note, low threshold for cath but may consider when cleared neurologically.  Goal of Therapy:  Heparin level 0.3-0.7 units/ml Monitor platelets by anticoagulation protocol: Yes   Plan:  1) Re-bolus heparin 2000 units x 1 2) Increase heparin drip to 1100 units/hr 3) Check another 6 hour heparin level  Fredrik Rigger 07/28/2013,9:46 AM

## 2013-07-28 NOTE — Progress Notes (Signed)
PATIENT DETAILS Name: Melanie Shepard Age: 47 y.o. Sex: female Date of Birth: 1966-02-10 Admit Date: 07/27/2013 Admitting Physician Houston Siren, MD NWG:[id number],YQMVH, MD  Subjective: Melanie Shepard is feeling well this AM, with no complaints of chest pain, headache, SOB, abdominal pain, weakness, or nausea/vomiting.  Her only complaint this AM is her neck pain.  She would like stronger pain medications than her home dose of Oxycodone HCL 10 mg Q4H.  Cardiology and Neurology have been consulted and she is scheduled for EEG today and cardiac cath tomorrow.    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, but also has previous head trauma, which could serve as a foci - Neurology consult 8/18 - EEG 8/18, possible anticonvulsant pending results  GAD - Continue home dose of Alprazolam 1 mg BID PO  Hypothyroidism - Stable, continue home dose of levothyroxine 100 mcg PO daily before breakfast  Chest pain/abnormal EKG/FH CAD - cardiology consulted-no further acute chest pain-current pain is chronic - negative Myoview in 2012 and 2014 - multiple risk factors, including smoking, FH, and abnormal EKG - Cardiac cath 8/19  Chronic pain - continue home dose of oxycodone-stop IV Narcotics  Tobacco use -refused nicotine patch-counseled regarding importance of quiting.  Have explained to the patient that she cannot leave the floor-she needs close telemetry monitoring as her admitting complains were for chest pain. She also had a seizure on admission.!! I offered her a nicotine patch which she refused  Disposition: Remain inpatient  DVT Prophylaxis: Prophylactic Heparin  Code Status: Full code  Family Communication Husband at bedside  Procedures:  EEG  Cardiac Cath  Echo  CONSULTS:  cardiology and neurology   MEDICATIONS: Scheduled Meds: . ALPRAZolam  1 mg Oral BID  . cholecalciferol  5,000 Units Oral Daily  . docusate  sodium  100 mg Oral BID  . levothyroxine  100 mcg Oral QAC breakfast  . metoprolol succinate  25 mg Oral Daily  . sodium chloride  3 mL Intravenous Q12H   Continuous Infusions: . dextrose 5 % and 0.9% NaCl 100 mL/hr at 07/28/13 0241  . heparin 850 Units/hr (07/28/13 0241)   PRN Meds:.HYDROmorphone (DILAUDID) injection, ondansetron (ZOFRAN) IV, ondansetron, oxyCODONE  Antibiotics: Anti-infectives   None       PHYSICAL EXAM: Vital signs in last 24 hours: Filed Vitals:   07/27/13 2237 07/28/13 0114 07/28/13 0200 07/28/13 0810  BP: 103/59  94/57 94/53  Pulse: 79  75 81  Temp:  98.3 F (36.8 C) 98.5 F (36.9 C) 98.3 F (36.8 C)  TempSrc:      Resp: 16  18   SpO2: 95%  98% 98%   Gen Exam: Awake and alert with clear speech.   Neck: Supple, No JVD.   Chest: B/L Clear.  CVS: /RRR no MGR Abdomen: soft, BS +, non tender, non distended.  Extremities: no edema, lower extremities warm to touch Neurologic: Non Focal.   Skin: No Rash.   Wounds: N/A.   Intake/Output from previous day: No intake or output data in the 24 hours ending 07/28/13 0944   LAB RESULTS: CBC  Recent Labs Lab 07/27/13 1941 07/28/13 0840  WBC 18.0* 11.8*  HGB 15.0 14.1  HCT 41.9 40.4  PLT 356 267  MCV 90.7 89.4  MCH 32.5 31.2  MCHC 35.8 34.9  RDW 14.5 14.5    Chemistries   Recent Labs Lab 07/27/13 1941 07/27/13 2107 07/28/13 0144  NA 132* 132*  --  K 3.4* 3.6  --   CL 94* 97  --   CO2 13* 20  --   GLUCOSE 150* 132*  --   BUN 7 8  --   CREATININE 0.75 0.72 0.69  CALCIUM 9.5 9.6  --     CBG:  Recent Labs Lab 07/27/13 1907  GLUCAP 138*    GFR The CrCl is unknown because both a height and weight (above a minimum accepted value) are required for this calculation.  Coagulation profile  Recent Labs Lab 07/28/13 0204  INR 1.03    Cardiac Enzymes  Recent Labs Lab 07/28/13 0158 07/28/13 0840  TROPONINI <0.30 <0.30    No components found with this basename: POCBNP,   No results found for this basename: DDIMER,  in the last 72 hours No results found for this basename: HGBA1C,  in the last 72 hours No results found for this basename: CHOL, HDL, LDLCALC, TRIG, CHOLHDL, LDLDIRECT,  in the last 72 hours No results found for this basename: TSH, T4TOTAL, FREET3, T3FREE, THYROIDAB,  in the last 72 hours No results found for this basename: VITAMINB12, FOLATE, FERRITIN, TIBC, IRON, RETICCTPCT,  in the last 72 hours No results found for this basename: LIPASE, AMYLASE,  in the last 72 hours  Urine Studies No results found for this basename: UACOL, UAPR, USPG, UPH, UTP, UGL, UKET, UBIL, UHGB, UNIT, UROB, ULEU, UEPI, UWBC, URBC, UBAC, CAST, CRYS, UCOM, BILUA,  in the last 72 hours  MICROBIOLOGY: No results found for this or any previous visit (from the past 240 hour(s)).  RADIOLOGY STUDIES/RESULTS: Dg Chest 1 View  07/27/2013   *RADIOLOGY REPORT*  Clinical Data: Chest pain  CHEST - 1 VIEW  Comparison: Chest CT May 25, 2013 and chest radiograph May 25, 2013  Findings:  There is underlying emphysema.  There is no edema or consolidation.  The heart size and pulmonary vascularity are normal.  No adenopathy.  No bone lesions.  There is postoperative change in the lower cervical spine.  IMPRESSION: Underlying emphysema.  No edema or consolidation.   Original Report Authenticated By: Bretta Bang, M.D.   Ct Head Wo Contrast  07/27/2013   *RADIOLOGY REPORT*  Clinical Data: Seizure  CT HEAD WITHOUT CONTRAST  Technique:  Contiguous axial images were obtained from the base of the skull through the vertex without contrast. Study was performed within 24 hours of patient arrival at the emergency department.  Comparison: None.  Findings: There is mild generalized atrophy for age.  There is no mass, hemorrhage, extra-axial fluid collection, or midline shift. Gray-white compartments are normal.  No acute infarct is seen. Bony calvarium appears intact.  The mastoid air cells are  clear. There is an air-fluid level in the right maxillary antrum.  IMPRESSION: Mild atrophy.  Acute right maxillary sinusitis.  Study otherwise unremarkable.   Original Report Authenticated By: Bretta Bang, M.D.    Gerrit Friends, PA-S 07/28/2013, 9:44 AM   LOS: 1 day   Attending Patient seen and examined, agree with the assessment and plan as outlined above. LHC tomorrow, await MRI Brain,EEG report. She has been asked not to leave the floor!!-she was admitted with seizure and chest pain!!  S Ghimire

## 2013-07-28 NOTE — Procedures (Signed)
EEG report.  Brief clinical history:  47 years old female with new onset isolated GTC seizure. Chronic daily use xanax and missed her medication day of admission. Also history of cerebral concussions.   Technique: this is a 17 channel routine scalp EEG performed at the bedside with bipolar and monopolar montages arranged in accordance to the international 10/20 system of electrode placement. One channel was dedicated to EKG recording.  No drowsiness or stage 2 sleep achieved. Intermittent photic stimulation was the sole activating procedure performed. Description:In the wakeful state, the best background consisted of a medium amplitude, posterior dominant, well sustained, symmetric and reactive 11 Hz rhythm. Beta activity was noted over the anterior head regions. Intermittent photic stimulation did induce a normal driving response.  No focal or generalized epileptiform discharges noted.  No slowing seen.  EKG showed sinus rhythm.  Impression: this is a normal awake EEG. The presence of beta activity is most likely a medication effect. Please, be aware that a normal EEG does not exclude the possibility of epilepsy.  Clinical correlation is advised.  Wyatt Portela, MD

## 2013-07-28 NOTE — Progress Notes (Signed)
Utilization review completed.  

## 2013-07-29 ENCOUNTER — Encounter (HOSPITAL_COMMUNITY)
Admission: EM | Disposition: A | Discharge status: Home / Self Care | Payer: Self-pay | Source: Home / Self Care | Attending: Internal Medicine

## 2013-07-29 DIAGNOSIS — F411 Generalized anxiety disorder: Secondary | ICD-10-CM

## 2013-07-29 DIAGNOSIS — I2510 Atherosclerotic heart disease of native coronary artery without angina pectoris: Secondary | ICD-10-CM

## 2013-07-29 DIAGNOSIS — R0789 Other chest pain: Secondary | ICD-10-CM

## 2013-07-29 HISTORY — PX: LEFT HEART CATHETERIZATION WITH CORONARY ANGIOGRAM: SHX5451

## 2013-07-29 LAB — BASIC METABOLIC PANEL
BUN: 7 mg/dL (ref 6–23)
CO2: 20 mEq/L (ref 19–32)
Calcium: 8.8 mg/dL (ref 8.4–10.5)
Chloride: 110 mEq/L (ref 96–112)
Creatinine, Ser: 0.78 mg/dL (ref 0.50–1.10)
GFR calc Af Amer: >90 mL/min (ref >90)
GFR calc non Af Amer: >90 mL/min (ref >90)
Glucose, Bld: 73 mg/dL (ref 70–99)
Potassium: 3.5 mEq/L (ref 3.5–5.1)
Sodium: 141 mEq/L (ref 135–145)

## 2013-07-29 LAB — CBC
HCT: 38.6 % (ref 36.0–46.0)
Hemoglobin: 13.2 g/dL (ref 12.0–15.0)
MCH: 31.4 pg (ref 26.0–34.0)
RBC: 4.21 MIL/uL (ref 3.87–5.11)

## 2013-07-29 LAB — HEPARIN LEVEL (UNFRACTIONATED): Heparin Unfractionated: 0.21 IU/mL — ABNORMAL LOW (ref 0.30–0.70)

## 2013-07-29 SURGERY — LEFT HEART CATHETERIZATION WITH CORONARY ANGIOGRAM
Anesthesia: LOCAL

## 2013-07-29 MED ORDER — NITROGLYCERIN 0.2 MG/ML ON CALL CATH LAB
INTRAVENOUS | Status: AC
  Filled 2013-07-29: qty 1

## 2013-07-29 MED ORDER — FENTANYL CITRATE 0.05 MG/ML IJ SOLN
INTRAMUSCULAR | Status: AC
  Filled 2013-07-29: qty 2

## 2013-07-29 MED ORDER — HEPARIN SODIUM (PORCINE) 1000 UNIT/ML IJ SOLN
INTRAMUSCULAR | Status: AC
  Filled 2013-07-29: qty 1

## 2013-07-29 MED ORDER — ASPIRIN 81 MG PO TABS: 81.0000 mg | ORAL_TABLET | Freq: Every day | ORAL | Status: DC

## 2013-07-29 MED ORDER — LIDOCAINE HCL (PF) 1 % IJ SOLN
INTRAMUSCULAR | Status: AC
  Filled 2013-07-29: qty 30

## 2013-07-29 MED ORDER — MIDAZOLAM HCL 2 MG/2ML IJ SOLN
INTRAMUSCULAR | Status: AC
  Filled 2013-07-29: qty 2

## 2013-07-29 MED ORDER — HEPARIN (PORCINE) IN NACL 100-0.45 UNIT/ML-% IJ SOLN
1300.0000 [IU]/h | INTRAMUSCULAR | Status: DC
  Filled 2013-07-29: qty 250

## 2013-07-29 MED ORDER — HEPARIN (PORCINE) IN NACL 2-0.9 UNIT/ML-% IJ SOLN
INTRAMUSCULAR | Status: AC
  Filled 2013-07-29: qty 1000

## 2013-07-29 MED ORDER — VERAPAMIL HCL 2.5 MG/ML IV SOLN
INTRAVENOUS | Status: AC
  Filled 2013-07-29: qty 2

## 2013-07-29 NOTE — Discharge Summary (Signed)
PATIENT DETAILS Name: Melanie Shepard Age: 47 y.o. Sex: female Date of Birth: 1966/11/27 MRN: [id number]. Admit Date: 07/27/2013 Admitting Physician: Houston Siren, MD WUJ:[id number],NWGNF, MD  Recommendations for Outpatient Follow-up:  1. Please refer to Neurology- I've asked patient to make an appointment with [name] neurology. Please see discharge instructions below. 2. Minimize narcotics, he may need to be slowly tapered off the benzodiazepines   PRIMARY DISCHARGE DIAGNOSIS:  Principal Problem:   Seizure Active Problems:   Generalized anxiety disorder   Unspecified hypothyroidism   Chest pain at rest   Chronic pain disorder   Abnormal EKG   Family history of coronary artery disease   Anxiety disorder      PAST MEDICAL HISTORY: Past Medical History  Diagnosis Date  . Post concussion syndrome 1999  . 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 Cervical pain (neck)     chronic  . Hypothyroidism   . Anxiety   . Arthritis     right hip    DISCHARGE MEDICATIONS:   Medication List         ALPRAZolam 1 MG tablet  Commonly known as:  XANAX  Take 1 tablet (1 mg total) by mouth 2 (two) times daily.     aspirin 81 MG tablet  Take 1 tablet (81 mg total) by mouth daily.     baclofen 10 MG tablet  Commonly known as:  LIORESAL  Take 10 mg by mouth 3 (three) times daily.     levothyroxine 100 MCG tablet  Commonly known as:  SYNTHROID, LEVOTHROID  Take 100 mcg by mouth daily.     metoprolol succinate 25 MG 24 hr tablet  Commonly known as:  TOPROL-XL  Take 25 mg by mouth daily.     Oxycodone HCl 10 MG Tabs  Take 1 tablet (10 mg total) by mouth 4 (four) times daily as needed.     Vitamin D3 5000 UNITS Caps  Take 1 tablet by mouth daily.        ALLERGIES:   Allergies  Allergen Reactions  . Adhesive [Tape]     Unknown  . Cymbalta [Duloxetine Hcl] Other (See Comments)    Makes my head do crazy things  . Ivp Dye [Iodinated Diagnostic Agents]     Unknown  . Other    Contrast/IV dye    BRIEF HPI:  See H&P, Labs, Consult and Test reports for all details in brief, patient is a 47 year old Caucasian female who has had a history of chronic pain syndrome, anxiety who was brought to the emergency room for evaluation of chest pain, while in the emergency room she had a witnessed generalized tonic-clonic seizure. She was then admitted for further evaluation and treatment.  CONSULTATIONS:   cardiology and neurology  PERTINENT RADIOLOGIC STUDIES: Dg Chest 1 View  07/27/2013   *RADIOLOGY REPORT*  Clinical Data: Chest pain  CHEST - 1 VIEW  Comparison: Chest CT May 25, 2013 and chest radiograph May 25, 2013  Findings:  There is underlying emphysema.  There is no edema or consolidation.  The heart size and pulmonary vascularity are normal.  No adenopathy.  No bone lesions.  There is postoperative change in the lower cervical spine.  IMPRESSION: Underlying emphysema.  No edema or consolidation.   Original Report Authenticated By: Bretta Bang, M.D.   Ct Head Wo Contrast  07/27/2013   *RADIOLOGY REPORT*  Clinical Data: Seizure  CT HEAD WITHOUT CONTRAST  Technique:  Contiguous axial images were obtained from the base of the skull  through the vertex without contrast. Study was performed within 24 hours of patient arrival at the emergency department.  Comparison: None.  Findings: There is mild generalized atrophy for age.  There is no mass, hemorrhage, extra-axial fluid collection, or midline shift. Gray-white compartments are normal.  No acute infarct is seen. Bony calvarium appears intact.  The mastoid air cells are clear. There is an air-fluid level in the right maxillary antrum.  IMPRESSION: Mild atrophy.  Acute right maxillary sinusitis.  Study otherwise unremarkable.   Original Report Authenticated By: Bretta Bang, M.D.   Mr Laqueta Jean Wo Contrast  07/28/2013   *RADIOLOGY REPORT*  Clinical Data: New onset seizure.  History of closed head injury  MRI HEAD WITHOUT AND  WITH CONTRAST  Technique:  Multiplanar, multiecho pulse sequences of the brain and surrounding structures were obtained according to standard protocol without and with intravenous contrast  Contrast: 15mL MULTIHANCE GADOBENATE DIMEGLUMINE 529 MG/ML IV SOLN  Comparison: CT head 07/27/2013  Findings: Ventricle size is normal.  Craniocervical junction is normal.  Pituitary normal in size.  Negative for acute infarct.  Small left frontal white matter hyperintensity.  Brainstem and cerebellum are normal.  Negative for hemorrhage or fluid collection.  Negative for mass or edema.  Postcontrast imaging reveals normal enhancement. Normal signal and volume of the hippocampus bilaterally.  IMPRESSION: No acute abnormality.   Original Report Authenticated By: Janeece Riggers, M.D.     PERTINENT LAB RESULTS: CBC:  Recent Labs  07/28/13 0840 07/29/13 0635  WBC 11.8* 10.1  HGB 14.1 13.2  HCT 40.4 38.6  PLT 267 208   CMET CMP     Component Value Date/Time   NA 141 07/29/2013 0635   K 3.5 07/29/2013 0635   CL 110 07/29/2013 0635   CO2 20 07/29/2013 0635   GLUCOSE 73 07/29/2013 0635   BUN 7 07/29/2013 0635   CREATININE 0.78 07/29/2013 0635   CREATININE 0.74 04/28/2013 1407   CALCIUM 8.8 07/29/2013 0635   PROT 7.0 04/28/2013 1407   ALBUMIN 4.4 04/28/2013 1407   AST 33 04/28/2013 1407   ALT 43* 04/28/2013 1407   ALKPHOS 117 04/28/2013 1407   BILITOT 0.4 04/28/2013 1407   GFRNONAA >90 07/29/2013 0635   GFRAA >90 07/29/2013 0635    GFR The CrCl is unknown because both a height and weight (above a minimum accepted value) are required for this calculation. No results found for this basename: LIPASE, AMYLASE,  in the last 72 hours  Recent Labs  07/28/13 0158 07/28/13 0840 07/28/13 1353  TROPONINI <0.30 <0.30 <0.30   No components found with this basename: POCBNP,  No results found for this basename: DDIMER,  in the last 72 hours No results found for this basename: HGBA1C,  in the last 72 hours  Recent Labs   07/28/13 1000  CHOL 220*  HDL 29*  LDLCALC 157*  TRIG 171*  CHOLHDL 7.6    Recent Labs  07/28/13 0144  TSH 3.929   No results found for this basename: VITAMINB12, FOLATE, FERRITIN, TIBC, IRON, RETICCTPCT,  in the last 72 hours Coags:  Recent Labs  07/28/13 0204  INR 1.03   Microbiology: No results found for this or any previous visit (from the past 240 hour(s)).   BRIEF HOSPITAL COURSE:   Principal Problem:   Seizure - As noted above, while waiting in the emergency room, patient had a witnessed generalized tonic-clonic seizure. From further history, patient missed one whole day of Xanax the day prior. It is  presumed that, the seizure was withdrawal seizures. However she did give a history of remote concussions in the past, as a result she underwent a workup which included a MRI brain and an EEG. Neurology was also consulted. MRI of the brain did not show any acute abnormalities, EEG did not show any seizure waves. At this time recommendations from neurology to continue to monitor off antiepileptic medications, as suspicion for withdrawal seizures is high. If she were to have recurrent seizures in the future, she would likely need to be started on antiepileptic therapy then. She has been asked not to drive, engage in a high-speed water sports, and gait activities at heights till she is seen a neurologist of her choice as an outpatient.  Active Problems: Chest pain - Although atypical, and a recent nuclear stress test negative, patient continues to have chest pain, as a result cardiology was consulted and patient underwent a cardiac catheterization, which did not show any significant coronary artery disease. - At this point, her chest pain is deemed noncardiac, this is a chronic issue, patient is on chronic narcotic therapy. It is felt that she has met maximum benefit and workup during her inpatient hospital stay, further workup for noncardiac chest pain can be done in the outpatient  setting.  Generalized anxiety disorder  - Continue home dose of Alprazolam 1 mg BID PO - She has been counseled not to abruptly stop benzodiazepines, and she knows the adverse effects of seizures occurring.  Hypothyroidism  - Stable, continue home dose of levothyroxine 100 mcg PO daily before breakfast  TODAY-DAY OF DISCHARGE:  Subjective:   Sharyn Drahos today has no headache,no chest abdominal pain,no new weakness tingling or numbness, feels much better wants to go home today.  Objective:   Blood pressure 93/58, pulse 65, temperature 97.1 F (36.2 C), temperature source Oral, resp. rate 18, SpO2 97.00%.  Intake/Output Summary (Last 24 hours) at 07/29/13 1022 Last data filed at 07/28/13 1700  Gross per 24 hour  Intake    360 ml  Output      0 ml  Net    360 ml   There were no vitals filed for this visit.  Exam Awake Alert, Oriented *3, No new F.N deficits, Normal affect [place].AT,PERRAL Supple Neck,No JVD, No cervical lymphadenopathy appriciated.  Symmetrical Chest wall movement, Good air movement bilaterally, CTAB RRR,No Gallops,Rubs or new Murmurs, No Parasternal Heave +ve B.Sounds, Abd Soft, Non tender, No organomegaly appriciated, No rebound -guarding or rigidity. No Cyanosis, Clubbing or edema, No new Rash or bruise  DISCHARGE CONDITION: Stable  DISPOSITION: Home   DISCHARGE INSTRUCTIONS:    Activity:  As tolerated   Diet recommendation: Heart Healthy diet       Discharge Orders   Future Appointments Provider Department Dept Phone   08/14/2013 1:10 PM Babs Sciara, MD [facility] [phone]   Future Orders Complete By Expires   Call MD for:  severe uncontrolled pain  As directed    Diet - low sodium heart healthy  As directed    Discharge instructions  As directed    Comments:     No driving, No activities at heights, No high speed water sports-till seen by outpatient Neurology. Please make an appointment with a Neurologist of your  choice, and start the above mentioned activities after you get clearance from the Neurologist.   Increase activity slowly  As directed       Follow-up Information   Follow up with Lilyan Punt, MD. Schedule  an appointment as soon as possible for a visit in 1 week.   Specialty:  Family Medicine   Contact information:   [address] [place] [address] [phone]       Follow up with [facility]. Schedule an appointment as soon as possible for a visit in 1 month. (please call and make an appointment with a MD of your choice)    Contact information:   [address] [place] [address] [phone]      Follow up with Governor Rooks, MD. (keep next appt)    Specialty:  Cardiology   Contact information:   [address] [place] [address] [phone]         Total Time spent on discharge equals 45 minutes.  SignedJeoffrey Massed 07/29/2013 10:22 AM

## 2013-07-29 NOTE — Progress Notes (Signed)
NEURO HOSPITALIST PROGRESS NOTE   SUBJECTIVE:                                                                                                                        No further seizures noted. Offers no neurological complains. MRI brain with and without contrast and EEG are unremarkable.  OBJECTIVE:                                                                                                                           Vital signs in last 24 hours: Temp:  [97.1 F (36.2 C)-99 F (37.2 C)] 97.1 F (36.2 C) (08/19 0508) Pulse Rate:  [65-87] 65 (08/19 0508) Resp:  [16-18] 18 (08/19 0508) BP: (93-97)/(58-64) 93/58 mmHg (08/19 0508) SpO2:  [97 %-100 %] 97 % (08/19 0508)  Intake/Output from previous day: 08/18 0701 - 08/19 0700 In: 360 [P.O.:360] Out: -  Intake/Output this shift:   Nutritional status: NPO  Past Medical History  Diagnosis Date  . Post concussion syndrome 1999  . 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 Cervical pain (neck)     chronic  . Hypothyroidism   . Anxiety   . Arthritis     right hip    Neurologic Exam:  Mental Status:  Alert, awake, oriented x 4, thought content appropriate. Comprehension, naming, and repetition intact. Speech fluent without evidence of aphasia. Able to follow 3 step commands without difficulty.  Cranial Nerves:  II: Discs flat bilaterally; Visual fields grossly normal, pupils equal, round, reactive to light and accommodation  III,IV, VI: ptosis not present, extra-ocular motions intact bilaterally  V,VII: smile symmetric, facial light touch sensation normal bilaterally  VIII: hearing normal bilaterally  IX,X: gag reflex present  XI: bilateral shoulder shrug  XII: midline tongue extension  Motor:  Right : Upper extremity 5/5 Left: Upper extremity 5/5  Lower extremity 5/5 Lower extremity 5/5  Tone and bulk:normal tone throughout; no atrophy noted  Sensory: Pinprick and light touch intact  throughout, bilaterally  Deep Tendon Reflexes:  1+ all over.  Plantars:  Right: downgoing Left: downgoing  Cerebellar:  normal finger-to-nose, normal heel-to-shin test  Gait:  No tested.  CV: pulses palpable throughout    Lab Results: Lab Results  Component Value Date/Time   CHOL 220* 07/28/2013 10:00 AM   Lipid Panel  Recent Labs  07/28/13 1000  CHOL 220*  TRIG 171*  HDL 29*  CHOLHDL 7.6  VLDL 34  LDLCALC 295*    Studies/Results: Dg Chest 1 View  07/27/2013   *RADIOLOGY REPORT*  Clinical Data: Chest pain  CHEST - 1 VIEW  Comparison: Chest CT May 25, 2013 and chest radiograph May 25, 2013  Findings:  There is underlying emphysema.  There is no edema or consolidation.  The heart size and pulmonary vascularity are normal.  No adenopathy.  No bone lesions.  There is postoperative change in the lower cervical spine.  IMPRESSION: Underlying emphysema.  No edema or consolidation.   Original Report Authenticated By: Bretta Bang, M.D.   Ct Head Wo Contrast  07/27/2013   *RADIOLOGY REPORT*  Clinical Data: Seizure  CT HEAD WITHOUT CONTRAST  Technique:  Contiguous axial images were obtained from the base of the skull through the vertex without contrast. Study was performed within 24 hours of patient arrival at the emergency department.  Comparison: None.  Findings: There is mild generalized atrophy for age.  There is no mass, hemorrhage, extra-axial fluid collection, or midline shift. Gray-white compartments are normal.  No acute infarct is seen. Bony calvarium appears intact.  The mastoid air cells are clear. There is an air-fluid level in the right maxillary antrum.  IMPRESSION: Mild atrophy.  Acute right maxillary sinusitis.  Study otherwise unremarkable.   Original Report Authenticated By: Bretta Bang, M.D.   Mr Laqueta Jean Wo Contrast  07/28/2013   *RADIOLOGY REPORT*  Clinical Data: New onset seizure.  History of closed head injury  MRI HEAD WITHOUT AND WITH CONTRAST   Technique:  Multiplanar, multiecho pulse sequences of the brain and surrounding structures were obtained according to standard protocol without and with intravenous contrast  Contrast: 15mL MULTIHANCE GADOBENATE DIMEGLUMINE 529 MG/ML IV SOLN  Comparison: CT head 07/27/2013  Findings: Ventricle size is normal.  Craniocervical junction is normal.  Pituitary normal in size.  Negative for acute infarct.  Small left frontal white matter hyperintensity.  Brainstem and cerebellum are normal.  Negative for hemorrhage or fluid collection.  Negative for mass or edema.  Postcontrast imaging reveals normal enhancement. Normal signal and volume of the hippocampus bilaterally.  IMPRESSION: No acute abnormality.   Original Report Authenticated By: Janeece Riggers, M.D.    MEDICATIONS                                                                                                                       I have reviewed the patient's current medications.  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 Neuroimaging and EEG unremarkable. She reports a history of severe head trauma in 1999. Late post traumatic seizure can not totally exclude, although it will be unusual to developed the first late postraumatic seizure 15 years later with MRI brain that showed no anatomic injury. I am in favor of deferring use of anticonvulsant at this moment, but she needs outpatient neurology follow up to further address this issue. Will sign off. Please, call neurology with any questions, concerns, or new developments.  Wyatt Portela, MD [facility] [phone]  07/29/2013, 9:01 AM

## 2013-07-29 NOTE — Progress Notes (Signed)
ANTICOAGULATION CONSULT NOTE - Follow Up Consult  Pharmacy Consult for Heparin Indication: chest pain/ACS  Allergies  Allergen Reactions  . Adhesive [Tape]     Unknown  . Cymbalta [Duloxetine Hcl] Other (See Comments)    Makes my head do crazy things  . Ivp Dye [Iodinated Diagnostic Agents]     Unknown  . Other     Contrast/IV dye    Patient Measurements:   Heparin Dosing Weight: 69kg  Vital Signs: Temp: 97.1 F (36.2 C) (08/19 0508) Temp src: Oral (08/19 0508) BP: 93/58 mmHg (08/19 0508) Pulse Rate: 65 (08/19 0508)  Labs:  Recent Labs  07/27/13 1941 07/27/13 2107 07/28/13 0144 07/28/13 0158 07/28/13 0204  07/28/13 0840 07/28/13 1353 07/28/13 1613 07/28/13 2200 07/29/13 0635  HGB 15.0  --   --   --   --   --  14.1  --   --   --  13.2  HCT 41.9  --   --   --   --   --  40.4  --   --   --  38.6  PLT 356  --   --   --   --   --  267  --   --   --  208  APTT  --   --   --   --  29  --   --   --   --   --   --   LABPROT  --   --   --   --  13.3  --   --   --   --   --   --   INR  --   --   --   --  1.03  --   --   --   --   --   --   HEPARINUNFRC  --   --   --   --   --   < > 0.12*  --  0.61 0.30 0.21*  CREATININE 0.75 0.72 0.69  --   --   --   --   --   --   --  0.78  TROPONINI  --   --   --  <0.30  --   --  <0.30 <0.30  --   --   --   < > = values in this interval not displayed.  The CrCl is unknown because both a height and weight (above a minimum accepted value) are required for this calculation.   Medications:  Heparin @ 1100 units/hr  Assessment: 47yof continues on heparin for CP, arrhythmia vs [place]. Troponins negative x 3. Heparin level is below goal this morning. No issues with infusion. CBC is stable. No bleeding reported. Plan is for cath today.  Goal of Therapy:  Heparin level 0.3-0.7 units/ml Monitor platelets by anticoagulation protocol: Yes   Plan:  1) Increase heparin to 1300 units/hr 2) Follow up after cath  Fredrik Rigger 07/29/2013,8:17 AM

## 2013-07-29 NOTE — Progress Notes (Signed)
[facility]  DAILY PROGRESS NOTE   Subjective:  No new problems overnight. Note absence of diagnostic findings on EEG. Discussed cath planned for today - she is ready.  Objective:  Temp:  [97.1 F (36.2 C)-99 F (37.2 C)] 97.1 F (36.2 C) (08/19 0508) Pulse Rate:  [65-87] 65 (08/19 0508) Resp:  [16-18] 18 (08/19 0508) BP: (93-97)/(58-64) 93/58 mmHg (08/19 0508) SpO2:  [97 %-100 %] 97 % (08/19 0508) Weight change:   Intake/Output from previous day: 08/18 0701 - 08/19 0700 In: 360 [P.O.:360] Out: -   Intake/Output from this shift:    Medications: Current Facility-Administered Medications  Medication Dose Route Frequency Provider Last Rate Last Dose  . 0.9 %  sodium chloride infusion   Intravenous Continuous Abelino Derrick, PA-C 75 mL/hr at 07/28/13 2116    . ALPRAZolam Prudy Feeler) tablet 1 mg  1 mg Oral BID Houston Siren, MD   1 mg at 07/28/13 2115  . aspirin tablet 325 mg  325 mg Oral Daily Abelino Derrick, [place]   325 mg at 07/28/13 1610  . cholecalciferol (VITAMIN D) tablet 5,000 Units  5,000 Units Oral Daily Houston Siren, MD   5,000 Units at 07/28/13 1023  . dextrose 5 %-0.9 % sodium chloride infusion   Intravenous Continuous Houston Siren, MD 100 mL/hr at 07/28/13 0241    . diazepam (VALIUM) tablet 5 mg  5 mg Oral On Call Luke K Kilroy, PA-C      . diphenhydrAMINE (BENADRYL) injection 25 mg  25 mg Intravenous Pre-Cath Luke K Kilroy, PA-C      . docusate sodium (COLACE) capsule 100 mg  100 mg Oral BID Houston Siren, MD   100 mg at 07/28/13 2115  . famotidine (PEPCID) IVPB 20 mg  20 mg Intravenous Pre-Cath Luke K Kilroy, PA-C      . heparin ADULT infusion 100 units/mL (25000 units/250 mL)  1,100 Units/hr Intravenous Continuous Fredrik Rigger, [facility] 11 mL/hr at 07/28/13 2115 1,100 Units/hr at 07/28/13 2115  . levothyroxine (SYNTHROID, LEVOTHROID) tablet 100 mcg  100 mcg Oral QAC breakfast Houston Siren, MD   100 mcg at 07/29/13 9604  . methylPREDNISolone sodium succinate  (SOLU-MEDROL) 125 mg/2 mL injection 125 mg  125 mg Intravenous Pre-Cath Luke K Kilroy, PA-C      . metoprolol succinate (TOPROL-XL) 24 hr tablet 25 mg  25 mg Oral Daily Houston Siren, MD      . ondansetron [facility]) tablet 4 mg  4 mg Oral Q6H PRN Houston Siren, MD       Or  . ondansetron [facility]) injection 4 mg  4 mg Intravenous Q6H PRN Houston Siren, MD   4 mg at 07/28/13 0703  . oxyCODONE (Oxy IR/ROXICODONE) immediate release tablet 10 mg  10 mg Oral Q4H PRN Maretta Bees, MD   10 mg at 07/29/13 5409  . sodium chloride 0.9 % injection 3 mL  3 mL Intravenous Q12H Houston Siren, MD   3 mL at 07/28/13 0238    Physical Exam: General appearance: alert, cooperative and no distress Neck: no adenopathy, no carotid bruit, no JVD, supple, symmetrical, trachea midline and thyroid not enlarged, symmetric, no tenderness/mass/nodules Lungs: clear to auscultation bilaterally Heart: regular rate and rhythm, S1, S2 normal, no murmur, click, rub or gallop Abdomen: soft, non-tender; bowel sounds normal; no masses,  no organomegaly Extremities: extremities normal, atraumatic, no cyanosis or edema Pulses: 2+ and symmetric Skin: Skin color, texture, turgor normal. No rashes or lesions Neurologic:  Grossly normal  Lab Results: Results for orders placed during the hospital encounter of 07/27/13 (from the past 48 hour(s))  GLUCOSE, CAPILLARY     Status: Abnormal   Collection Time    07/27/13  7:07 PM      Result Value Range   Glucose-Capillary 138 (*) 70 - 99 mg/dL   Comment 1 Documented in Chart     Comment 2 Notify RN    PRO B NATRIURETIC PEPTIDE     Status: None   Collection Time    07/27/13  7:41 PM      Result Value Range   Pro B Natriuretic peptide (BNP) 80.7  0 - 125 pg/mL  BASIC METABOLIC PANEL     Status: Abnormal   Collection Time    07/27/13  7:41 PM      Result Value Range   Sodium 132 (*) 135 - 145 mEq/L   Potassium 3.4 (*) 3.5 - 5.1 mEq/L   Chloride 94 (*) 96 - 112 mEq/L   CO2 13 (*) 19 - 32 mEq/L    Glucose, Bld 150 (*) 70 - 99 mg/dL   BUN 7  6 - 23 mg/dL   Creatinine, Ser 7.82  0.50 - 1.10 mg/dL   Calcium 9.5  8.4 - 95.6 mg/dL   GFR calc non Af Amer >90  >90 mL/min   GFR calc Af Amer >90  >90 mL/min   Comment: (NOTE)     The eGFR has been calculated using the CKD EPI equation.     This calculation has not been validated in all clinical situations.     eGFR's persistently <90 mL/min signify possible Chronic Kidney     Disease.  CBC     Status: Abnormal   Collection Time    07/27/13  7:41 PM      Result Value Range   WBC 18.0 (*) 4.0 - 10.5 K/uL   RBC 4.62  3.87 - 5.11 MIL/uL   Hemoglobin 15.0  12.0 - 15.0 g/dL   HCT 21.3  08.6 - 57.8 %   MCV 90.7  78.0 - 100.0 fL   MCH 32.5  26.0 - 34.0 pg   MCHC 35.8  30.0 - 36.0 g/dL   RDW 46.9  62.9 - 52.8 %   Platelets 356  150 - 400 K/uL  POCT I-STAT TROPONIN I     Status: None   Collection Time    07/27/13  8:03 PM      Result Value Range   Troponin i, poc 0.02  0.00 - 0.08 ng/mL   Comment 3            Comment: Due to the release kinetics of cTnI,     a negative result within the first hours     of the onset of symptoms does not rule out     myocardial infarction with certainty.     If myocardial infarction is still suspected,     repeat the test at appropriate intervals.  BASIC METABOLIC PANEL     Status: Abnormal   Collection Time    07/27/13  9:07 PM      Result Value Range   Sodium 132 (*) 135 - 145 mEq/L   Potassium 3.6  3.5 - 5.1 mEq/L   Chloride 97  96 - 112 mEq/L   CO2 20  19 - 32 mEq/L   Glucose, Bld 132 (*) 70 - 99 mg/dL   BUN 8  6 - 23 mg/dL  Creatinine, Ser 0.72  0.50 - 1.10 mg/dL   Calcium 9.6  8.4 - 16.1 mg/dL   GFR calc non Af Amer >90  >90 mL/min   GFR calc Af Amer >90  >90 mL/min   Comment: (NOTE)     The eGFR has been calculated using the CKD EPI equation.     This calculation has not been validated in all clinical situations.     eGFR's persistently <90 mL/min signify possible Chronic Kidney      Disease.  CREATININE, SERUM     Status: None   Collection Time    07/28/13  1:44 AM      Result Value Range   Creatinine, Ser 0.69  0.50 - 1.10 mg/dL   GFR calc non Af Amer >90  >90 mL/min   GFR calc Af Amer >90  >90 mL/min   Comment: (NOTE)     The eGFR has been calculated using the CKD EPI equation.     This calculation has not been validated in all clinical situations.     eGFR's persistently <90 mL/min signify possible Chronic Kidney     Disease.  TSH     Status: None   Collection Time    07/28/13  1:44 AM      Result Value Range   TSH 3.929  0.350 - 4.500 uIU/mL   Comment: Performed at [facility]  TROPONIN I     Status: None   Collection Time    07/28/13  1:58 AM      Result Value Range   Troponin I <0.30  <0.30 ng/mL   Comment:            Due to the release kinetics of cTnI,     a negative result within the first hours     of the onset of symptoms does not rule out     myocardial infarction with certainty.     If myocardial infarction is still suspected,     repeat the test at appropriate intervals.  APTT     Status: None   Collection Time    07/28/13  2:04 AM      Result Value Range   aPTT 29  24 - 37 seconds  PROTIME-INR     Status: None   Collection Time    07/28/13  2:04 AM      Result Value Range   Prothrombin Time 13.3  11.6 - 15.2 seconds   INR 1.03  0.00 - 1.49  TROPONIN I     Status: None   Collection Time    07/28/13  8:40 AM      Result Value Range   Troponin I <0.30  <0.30 ng/mL   Comment:            Due to the release kinetics of cTnI,     a negative result within the first hours     of the onset of symptoms does not rule out     myocardial infarction with certainty.     If myocardial infarction is still suspected,     repeat the test at appropriate intervals.  HEPARIN LEVEL (UNFRACTIONATED)     Status: Abnormal   Collection Time    07/28/13  8:40 AM      Result Value Range   Heparin Unfractionated 0.12 (*) 0.30 - 0.70 IU/mL    Comment:            IF HEPARIN RESULTS ARE BELOW  EXPECTED VALUES, AND PATIENT     DOSAGE HAS BEEN CONFIRMED,     SUGGEST FOLLOW UP TESTING     OF ANTITHROMBIN III LEVELS.  CBC     Status: Abnormal   Collection Time    07/28/13  8:40 AM      Result Value Range   WBC 11.8 (*) 4.0 - 10.5 K/uL   RBC 4.52  3.87 - 5.11 MIL/uL   Hemoglobin 14.1  12.0 - 15.0 g/dL   HCT 16.1  09.6 - 04.5 %   MCV 89.4  78.0 - 100.0 fL   MCH 31.2  26.0 - 34.0 pg   MCHC 34.9  30.0 - 36.0 g/dL   RDW 40.9  81.1 - 91.4 %   Platelets 267  150 - 400 K/uL   Comment: DELTA CHECK NOTED     REPEATED TO VERIFY     SPECIMEN CHECKED FOR CLOTS  LIPID PANEL     Status: Abnormal   Collection Time    07/28/13 10:00 AM      Result Value Range   Cholesterol 220 (*) 0 - 200 mg/dL   Triglycerides 782 (*) <150 mg/dL   HDL 29 (*) >95 mg/dL   Total CHOL/HDL Ratio 7.6     VLDL 34  0 - 40 mg/dL   LDL Cholesterol 621 (*) 0 - 99 mg/dL   Comment:            Total Cholesterol/HDL:CHD Risk     Coronary Heart Disease Risk Table                         Men   Women      1/2 Average Risk   3.4   3.3      Average Risk       5.0   4.4      2 X Average Risk   9.6   7.1      3 X Average Risk  23.4   11.0                Use the calculated Patient Ratio     above and the CHD Risk Table     to determine the patient's CHD Risk.                ATP III CLASSIFICATION (LDL):      <100     mg/dL   Optimal      308-657  mg/dL   Near or Above                        Optimal      130-159  mg/dL   Borderline      846-962  mg/dL   High      >952     mg/dL   Very High  TROPONIN I     Status: None   Collection Time    07/28/13  1:53 PM      Result Value Range   Troponin I <0.30  <0.30 ng/mL   Comment:            Due to the release kinetics of cTnI,     a negative result within the first hours     of the onset of symptoms does not rule out     myocardial infarction with certainty.     If myocardial infarction is still suspected,      repeat  the test at appropriate intervals.  HEPARIN LEVEL (UNFRACTIONATED)     Status: None   Collection Time    07/28/13  4:13 PM      Result Value Range   Heparin Unfractionated 0.61  0.30 - 0.70 IU/mL   Comment:            IF HEPARIN RESULTS ARE BELOW     EXPECTED VALUES, AND PATIENT     DOSAGE HAS BEEN CONFIRMED,     SUGGEST FOLLOW UP TESTING     OF ANTITHROMBIN III LEVELS.  HEPARIN LEVEL (UNFRACTIONATED)     Status: None   Collection Time    07/28/13 10:00 PM      Result Value Range   Heparin Unfractionated 0.30  0.30 - 0.70 IU/mL   Comment:            IF HEPARIN RESULTS ARE BELOW     EXPECTED VALUES, AND PATIENT     DOSAGE HAS BEEN CONFIRMED,     SUGGEST FOLLOW UP TESTING     OF ANTITHROMBIN III LEVELS.  HEPARIN LEVEL (UNFRACTIONATED)     Status: Abnormal   Collection Time    07/29/13  6:35 AM      Result Value Range   Heparin Unfractionated 0.21 (*) 0.30 - 0.70 IU/mL   Comment:            IF HEPARIN RESULTS ARE BELOW     EXPECTED VALUES, AND PATIENT     DOSAGE HAS BEEN CONFIRMED,     SUGGEST FOLLOW UP TESTING     OF ANTITHROMBIN III LEVELS.  CBC     Status: None   Collection Time    07/29/13  6:35 AM      Result Value Range   WBC 10.1  4.0 - 10.5 K/uL   RBC 4.21  3.87 - 5.11 MIL/uL   Hemoglobin 13.2  12.0 - 15.0 g/dL   HCT 08.6  57.8 - 46.9 %   MCV 91.7  78.0 - 100.0 fL   MCH 31.4  26.0 - 34.0 pg   MCHC 34.2  30.0 - 36.0 g/dL   RDW 62.9  52.8 - 41.3 %   Platelets 208  150 - 400 K/uL  BASIC METABOLIC PANEL     Status: None   Collection Time    07/29/13  6:35 AM      Result Value Range   Sodium 141  135 - 145 mEq/L   Potassium 3.5  3.5 - 5.1 mEq/L   Chloride 110  96 - 112 mEq/L   CO2 20  19 - 32 mEq/L   Glucose, Bld 73  70 - 99 mg/dL   BUN 7  6 - 23 mg/dL   Creatinine, Ser 2.44  0.50 - 1.10 mg/dL   Calcium 8.8  8.4 - 01.0 mg/dL   GFR calc non Af Amer >90  >90 mL/min   GFR calc Af Amer >90  >90 mL/min   Comment: (NOTE)     The eGFR has been calculated  using the CKD EPI equation.     This calculation has not been validated in all clinical situations.     eGFR's persistently <90 mL/min signify possible Chronic Kidney     Disease.    Imaging: Dg Chest 1 View  07/27/2013   *RADIOLOGY REPORT*  Clinical Data: Chest pain  CHEST - 1 VIEW  Comparison: Chest CT May 25, 2013 and chest radiograph May 25, 2013  Findings:  There is underlying emphysema.  There is no edema or consolidation.  The heart size and pulmonary vascularity are normal.  No adenopathy.  No bone lesions.  There is postoperative change in the lower cervical spine.  IMPRESSION: Underlying emphysema.  No edema or consolidation.   Original Report Authenticated By: Bretta Bang, M.D.   Ct Head Wo Contrast  07/27/2013   *RADIOLOGY REPORT*  Clinical Data: Seizure  CT HEAD WITHOUT CONTRAST  Technique:  Contiguous axial images were obtained from the base of the skull through the vertex without contrast. Study was performed within 24 hours of patient arrival at the emergency department.  Comparison: None.  Findings: There is mild generalized atrophy for age.  There is no mass, hemorrhage, extra-axial fluid collection, or midline shift. Gray-white compartments are normal.  No acute infarct is seen. Bony calvarium appears intact.  The mastoid air cells are clear. There is an air-fluid level in the right maxillary antrum.  IMPRESSION: Mild atrophy.  Acute right maxillary sinusitis.  Study otherwise unremarkable.   Original Report Authenticated By: Bretta Bang, M.D.   Mr Laqueta Jean Wo Contrast  07/28/2013   *RADIOLOGY REPORT*  Clinical Data: New onset seizure.  History of closed head injury  MRI HEAD WITHOUT AND WITH CONTRAST  Technique:  Multiplanar, multiecho pulse sequences of the brain and surrounding structures were obtained according to standard protocol without and with intravenous contrast  Contrast: 15mL MULTIHANCE GADOBENATE DIMEGLUMINE 529 MG/ML IV SOLN  Comparison: CT head 07/27/2013   Findings: Ventricle size is normal.  Craniocervical junction is normal.  Pituitary normal in size.  Negative for acute infarct.  Small left frontal white matter hyperintensity.  Brainstem and cerebellum are normal.  Negative for hemorrhage or fluid collection.  Negative for mass or edema.  Postcontrast imaging reveals normal enhancement. Normal signal and volume of the hippocampus bilaterally.  IMPRESSION: No acute abnormality.   Original Report Authenticated By: Janeece Riggers, M.D.    Assessment:  1. Principal Problem: 2.   Seizure 3. Active Problems: 4.   Generalized anxiety disorder 5.   Unspecified hypothyroidism 6.   Chest pain at rest 7.   Chronic pain disorder 8.   Abnormal EKG 9.   Family history of coronary artery disease 10.   Anxiety disorder 11.   Plan:  1. Cardiac cath and possible PCI stent today.  This procedure has been fully reviewed with the patient and written informed consent has been obtained.   Time Spent Directly with Patient:  30 minutes  Length of Stay:  LOS: 2 days    Melanie Shepard 07/29/2013, 8:12 AM

## 2013-07-29 NOTE — Op Note (Signed)
CARDIAC CATHETERIZATION REPORT   Procedures performed:  1. Left heart catheterization  2. Selective coronary angiography  3. Left ventriculography   Reason for procedure:  Unexplained atypical chest pain  Procedure performed by: Thurmon Fair, MD, [facility]  Complications: none   Estimated blood loss: less than 5 mL   History:  This 47 year old female has a history of COPD, anxiety, hypothyroidism and has chronic neck pain after cervical spine surgery. She lives in [place] and had admission to the hospital there with chest pain. She was later admitted to the hospital here in June and had an echocardiogram showing a small effusion. A CT angiogram showed a mild amount of calcification the LAD and possible pneumonia at the bases. She was treated with antibiotics. Since that time she's continued to have chest pain and saw Dr. Alanda Amass on 7/10 with new T wave inversions in the anterior leads. She was scheduled to have a nuclear perfusion scan and this was done on 7/30 and showed normal myocardial perfusion at the time. Recurrent chest pain symptoms have led to readmission for suspicion of CAD. Here for definitive diagnosis.   Consent: The risks, benefits, and details of the procedure were explained to the patient. Risks including death, MI, stroke, bleeding, limb ischemia, renal failure and allergy were described and accepted by the patient. Informed written consent was obtained prior to proceeding.  Technique: The patient was brought to the cardiac catheterization laboratory in the fasting state. He was prepped and draped in the usual sterile fashion. Local anesthesia with 1% lidocaine was administered to the right wrist area. Using the modified Seldinger technique a 5 French right radial artery sheath was introduced without difficulty. Under fluoroscopic guidance, using 5 [place] TIG (RCA) and MPA1 (LCA and LV) catheters, selective cannulation of the left coronary artery, right coronary  artery and left ventricle were respectively performed. Several coronary angiograms in a variety of projections were recorded, as well as a left ventriculogram in the RAO projection. Left ventricular pressure and a pull back to the aorta were recorded. No immediate complications occurred. At the end of the procedure, all catheters were removed. After the procedure, hemostasis will be achieved with manual pressure.  Contrast used: 70 mL Omnipaque  Angiographic Findings:  1. The left main coronary artery has mild calcification, but no visible luminal stenoses and bifurcates in the usual fashion into the left anterior descending artery and left circumflex coronary artery.  2. The left anterior descending artery is a large vessel that reaches the apex and generates two major diagonal branches, the second being larger. There is evidence of mild luminal irregularities and mild calcification. No hemodynamically meaningful stenoses are seen. 3. The left circumflex coronary artery is a large-size vessel non dominant vessel that generates two major oblique marginal arteries, the proximal being larger. There is evidence of mild luminal irregularities and minimal calcification. There is a 30% post-ostial lesion, but no hemodynamically meaningful stenoses are seen. A smooth 20-30% lesion is seen in the cranial branch of the larger OM artery. 4. The right coronary artery is a large-size dominant vessel that generates a branching posterior lateral ventricular system as well as the PDA. There is evidence of minimal luminal irregularities and minimal calcification. No hemodynamically meaningful stenoses are seen.  5. The left ventricle is normal in size. The left ventricle systolic function is normal, with an estimated ejection fraction of 55%. Regional wall motion abnormalities are not seen. No left ventricular thrombus is seen. There is no mitral insufficiency. The  ascending aorta appears normal. There is no aortic valve  stenosis by pullback. The left ventricular end-diastolic pressure is 9 mm Hg.    IMPRESSIONS:  Mild coronary atherosclerosis, but no significant stenoses.  RECOMMENDATION:  Medical treatment for risk factors, to avoid disease progression. There is no identifiable cardiac cause for her symptoms.     Thurmon Fair, MD, [facility] [phone] office [phone] pager  Cc: R.A. Alanda Amass, MD

## 2013-08-08 ENCOUNTER — Telehealth: Payer: Self-pay | Admitting: Family Medicine

## 2013-08-08 ENCOUNTER — Encounter: Payer: Self-pay | Admitting: Family Medicine

## 2013-08-08 NOTE — Telephone Encounter (Signed)
We will send this afternoon.

## 2013-08-08 NOTE — Telephone Encounter (Signed)
Patient is calling to check on the letter that you were supposed to send to Dr Laurian Brim. They told the patient that you have not sent it yet.

## 2013-08-08 NOTE — Telephone Encounter (Signed)
Letter was faxed to Dr. Jon Gills office. Patient was notified.

## 2013-08-14 ENCOUNTER — Ambulatory Visit (INDEPENDENT_AMBULATORY_CARE_PROVIDER_SITE_OTHER): Payer: BC Managed Care – PPO | Admitting: Family Medicine

## 2013-08-14 ENCOUNTER — Encounter: Payer: Self-pay | Admitting: Family Medicine

## 2013-08-14 VITALS — BP 110/72 | Ht 67.0 in | Wt 143.0 lb

## 2013-08-14 DIAGNOSIS — F411 Generalized anxiety disorder: Secondary | ICD-10-CM

## 2013-08-14 DIAGNOSIS — R0609 Other forms of dyspnea: Secondary | ICD-10-CM

## 2013-08-14 DIAGNOSIS — M542 Cervicalgia: Secondary | ICD-10-CM

## 2013-08-14 DIAGNOSIS — G8929 Other chronic pain: Secondary | ICD-10-CM

## 2013-08-14 DIAGNOSIS — R0600 Dyspnea, unspecified: Secondary | ICD-10-CM

## 2013-08-14 DIAGNOSIS — R569 Unspecified convulsions: Secondary | ICD-10-CM

## 2013-08-14 MED ORDER — OXYCODONE-ACETAMINOPHEN 10-325 MG PO TABS: 1.0000 | ORAL_TABLET | Freq: Four times a day (QID) | ORAL | Status: DC | PRN

## 2013-08-14 MED ORDER — ALPRAZOLAM 1 MG PO TABS: 1.0000 mg | ORAL_TABLET | Freq: Two times a day (BID) | ORAL | Status: DC
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
## 2013-08-15 ENCOUNTER — Telehealth: Payer: Self-pay | Admitting: Family Medicine

## 2013-08-15 MED ORDER — ONDANSETRON 4 MG PO TBDP: 4.0000 mg | ORAL_TABLET | Freq: Three times a day (TID) | ORAL | Status: DC | PRN

## 2013-08-15 NOTE — Telephone Encounter (Signed)
Rx sent electronically to [facility] in [place]. Patient notified.

## 2013-08-15 NOTE — Telephone Encounter (Signed)
zof odt 4 mg 24 one q 6 prn

## 2013-08-15 NOTE — Telephone Encounter (Signed)
Patient says that she has a horrible headache that is making her nauseas and she would like some kind of nausea medicine called in.   [facility] in [place]

## 2013-08-19 ENCOUNTER — Ambulatory Visit (HOSPITAL_COMMUNITY)
Admission: RE | Admit: 2013-08-19 | Discharge: 2013-08-19 | Disposition: A | Discharge status: Home / Self Care | Payer: BC Managed Care – PPO | Source: Ambulatory Visit | Attending: Family Medicine | Admitting: Family Medicine

## 2013-08-19 ENCOUNTER — Other Ambulatory Visit: Payer: Self-pay | Admitting: Family Medicine

## 2013-08-19 DIAGNOSIS — R0989 Other specified symptoms and signs involving the circulatory and respiratory systems: Secondary | ICD-10-CM | POA: Insufficient documentation

## 2013-08-19 DIAGNOSIS — R0609 Other forms of dyspnea: Secondary | ICD-10-CM | POA: Insufficient documentation

## 2013-08-19 MED ORDER — ALBUTEROL SULFATE (5 MG/ML) 0.5% IN NEBU
2.5000 mg | INHALATION_SOLUTION | Freq: Once | RESPIRATORY_TRACT | Status: AC
  Administered 2013-08-19: 2.5 mg via RESPIRATORY_TRACT

## 2013-08-20 LAB — T4, FREE: Free T4: 0.77 ng/dL — ABNORMAL LOW (ref 0.80–1.80)

## 2013-08-20 NOTE — Procedures (Signed)
NAMEBLONNIE, MASKE                ACCOUNT NO.:  [id number]  MEDICAL RECORD NO.:  [id number]  LOCATION:  RESP                          FACILITY:  [facility]  PHYSICIAN:  Liesl Simons L. Juanetta Gosling, M.D.DATE OF BIRTH:  1966-03-07  DATE OF PROCEDURE: DATE OF DISCHARGE:  08/19/2013                           PULMONARY FUNCTION TEST   REASON FOR PULMONARY FUNCTION TESTING:  Dyspnea.  1. Spirometry shows a mild-to-moderate ventilatory defect with     evidence of airflow obstruction. 2. There is no significant bronchodilator improvement. 3. This study is consistent with COPD.     Jillyan Plitt L. Juanetta Gosling, M.D.     ELH/MEDQ  D:  08/19/2013  T:  08/20/2013  Job:  [id number]  cc:   Lorin Picket A. Gerda Diss, MD Fax: [phone]

## 2013-08-21 LAB — PULMONARY FUNCTION TEST

## 2013-08-22 ENCOUNTER — Other Ambulatory Visit: Payer: Self-pay | Admitting: *Deleted

## 2013-08-22 DIAGNOSIS — E039 Hypothyroidism, unspecified: Secondary | ICD-10-CM

## 2013-08-22 MED ORDER — LEVOTHYROXINE SODIUM 125 MCG PO TABS: 125.0000 ug | ORAL_TABLET | Freq: Every day | ORAL | Status: DC

## 2013-08-28 NOTE — Progress Notes (Signed)
Spoke with Melanie Shepard regarding: Mild COPD probably related to smoking. There are medications that we could try if the patient is interested. It is highly advisable to quit smoking. Pt verbalized understanding and would like to discuss further. She said she is going to call back later this afternoon to make an appt.

## 2013-09-09 ENCOUNTER — Encounter (HOSPITAL_COMMUNITY): Payer: Self-pay | Admitting: *Deleted

## 2013-09-09 ENCOUNTER — Encounter: Payer: Self-pay | Admitting: Family Medicine

## 2013-09-09 ENCOUNTER — Ambulatory Visit (INDEPENDENT_AMBULATORY_CARE_PROVIDER_SITE_OTHER): Payer: BC Managed Care – PPO | Admitting: Family Medicine

## 2013-09-09 ENCOUNTER — Inpatient Hospital Stay (HOSPITAL_COMMUNITY)
Admission: EM | Admit: 2013-09-09 | Discharge: 2013-09-10 | DRG: 813 | Disposition: A | Discharge status: Home / Self Care | Payer: BC Managed Care – PPO | Attending: Family Medicine | Admitting: Family Medicine

## 2013-09-09 VITALS — BP 108/80 | Temp 97.5°F | Ht 67.0 in | Wt 135.4 lb

## 2013-09-09 DIAGNOSIS — M542 Cervicalgia: Secondary | ICD-10-CM

## 2013-09-09 DIAGNOSIS — K5289 Other specified noninfective gastroenteritis and colitis: Secondary | ICD-10-CM

## 2013-09-09 DIAGNOSIS — K567 Ileus, unspecified: Secondary | ICD-10-CM

## 2013-09-09 DIAGNOSIS — R1110 Vomiting, unspecified: Secondary | ICD-10-CM

## 2013-09-09 DIAGNOSIS — Z79899 Other long term (current) drug therapy: Secondary | ICD-10-CM

## 2013-09-09 DIAGNOSIS — G8929 Other chronic pain: Secondary | ICD-10-CM | POA: Diagnosis present

## 2013-09-09 DIAGNOSIS — Z981 Arthrodesis status: Secondary | ICD-10-CM

## 2013-09-09 DIAGNOSIS — A088 Other specified intestinal infections: Principal | ICD-10-CM | POA: Diagnosis present

## 2013-09-09 DIAGNOSIS — Z90710 Acquired absence of both cervix and uterus: Secondary | ICD-10-CM

## 2013-09-09 DIAGNOSIS — I10 Essential (primary) hypertension: Secondary | ICD-10-CM | POA: Diagnosis present

## 2013-09-09 DIAGNOSIS — E876 Hypokalemia: Secondary | ICD-10-CM | POA: Diagnosis present

## 2013-09-09 DIAGNOSIS — R109 Unspecified abdominal pain: Secondary | ICD-10-CM

## 2013-09-09 DIAGNOSIS — M129 Arthropathy, unspecified: Secondary | ICD-10-CM | POA: Diagnosis present

## 2013-09-09 DIAGNOSIS — F17200 Nicotine dependence, unspecified, uncomplicated: Secondary | ICD-10-CM | POA: Diagnosis present

## 2013-09-09 DIAGNOSIS — K529 Noninfective gastroenteritis and colitis, unspecified: Secondary | ICD-10-CM

## 2013-09-09 DIAGNOSIS — F411 Generalized anxiety disorder: Secondary | ICD-10-CM | POA: Diagnosis present

## 2013-09-09 DIAGNOSIS — F419 Anxiety disorder, unspecified: Secondary | ICD-10-CM

## 2013-09-09 DIAGNOSIS — E039 Hypothyroidism, unspecified: Secondary | ICD-10-CM | POA: Diagnosis present

## 2013-09-09 LAB — POCT URINALYSIS DIPSTICK
Ketones, UA: POSITIVE
Protein, UA: 30
Urobilinogen, UA: 1
pH, UA: 5

## 2013-09-09 LAB — COMPREHENSIVE METABOLIC PANEL
ALT: 24 U/L (ref 0–35)
Albumin: 4.4 g/dL (ref 3.5–5.2)
CO2: 24 mEq/L (ref 19–32)
Calcium: 9.7 mg/dL (ref 8.4–10.5)
Creatinine, Ser: 0.75 mg/dL (ref 0.50–1.10)
GFR calc Af Amer: >90 mL/min (ref >90)
GFR calc non Af Amer: >90 mL/min (ref >90)
Glucose, Bld: 107 mg/dL — ABNORMAL HIGH (ref 70–99)
Potassium: 3.4 mEq/L — ABNORMAL LOW (ref 3.5–5.1)
Sodium: 140 mEq/L (ref 135–145)
Total Bilirubin: 0.8 mg/dL (ref 0.3–1.2)
Total Protein: 7.7 g/dL (ref 6.0–8.3)

## 2013-09-09 LAB — URINE MICROSCOPIC-ADD ON

## 2013-09-09 LAB — CBC WITH DIFFERENTIAL/PLATELET
Basophils Absolute: 0.1 10*3/uL (ref 0.0–0.1)
Eosinophils Absolute: 0 10*3/uL (ref 0.0–0.7)
Eosinophils Relative: 0 % (ref 0–5)
Lymphs Abs: 2.9 10*3/uL (ref 0.7–4.0)
MCH: 31.6 pg (ref 26.0–34.0)
MCV: 92.6 fL (ref 78.0–100.0)
Platelets: 247 10*3/uL (ref 150–400)
RDW: 14.7 % (ref 11.5–15.5)

## 2013-09-09 LAB — URINALYSIS, ROUTINE W REFLEX MICROSCOPIC
Leukocytes, UA: NEGATIVE
Nitrite: NEGATIVE
Specific Gravity, Urine: >1.03 — ABNORMAL HIGH (ref 1.005–1.030)
Urobilinogen, UA: 0.2 mg/dL (ref 0.0–1.0)

## 2013-09-09 MED ORDER — ENOXAPARIN SODIUM 40 MG/0.4ML ~~LOC~~ SOLN
40.0000 mg | SUBCUTANEOUS | Status: DC
  Administered 2013-09-09: 40 mg via SUBCUTANEOUS
  Filled 2013-09-09: qty 0.4

## 2013-09-09 MED ORDER — ONDANSETRON HCL 4 MG PO TABS: 4.0000 mg | ORAL_TABLET | Freq: Four times a day (QID) | ORAL | Status: DC | PRN

## 2013-09-09 MED ORDER — ACETAMINOPHEN 650 MG RE SUPP: 650.0000 mg | Freq: Four times a day (QID) | RECTAL | Status: DC | PRN

## 2013-09-09 MED ORDER — PANTOPRAZOLE SODIUM 40 MG IV SOLR
40.0000 mg | Freq: Once | INTRAVENOUS | Status: AC
  Administered 2013-09-09: 40 mg via INTRAVENOUS
  Filled 2013-09-09: qty 40

## 2013-09-09 MED ORDER — BACLOFEN 10 MG PO TABS
10.0000 mg | ORAL_TABLET | Freq: Three times a day (TID) | ORAL | Status: DC
  Administered 2013-09-09 – 2013-09-10 (×3): 10 mg via ORAL
  Filled 2013-09-09 (×3): qty 1

## 2013-09-09 MED ORDER — ONDANSETRON HCL 4 MG/2ML IJ SOLN
4.0000 mg | Freq: Once | INTRAMUSCULAR | Status: AC
  Administered 2013-09-09: 4 mg via INTRAVENOUS
  Filled 2013-09-09: qty 2

## 2013-09-09 MED ORDER — METOPROLOL SUCCINATE ER 25 MG PO TB24
25.0000 mg | ORAL_TABLET | Freq: Every day | ORAL | Status: DC
Start: 2013-09-10 — End: 2013-09-10
  Administered 2013-09-10: 25 mg via ORAL
  Filled 2013-09-09: qty 1

## 2013-09-09 MED ORDER — HYDROMORPHONE HCL PF 1 MG/ML IJ SOLN
1.0000 mg | INTRAMUSCULAR | Status: DC | PRN
  Administered 2013-09-10 (×3): 1 mg via INTRAVENOUS
  Filled 2013-09-09 (×3): qty 1

## 2013-09-09 MED ORDER — HYDROMORPHONE HCL PF 1 MG/ML IJ SOLN
1.0000 mg | INTRAMUSCULAR | Status: AC | PRN
  Administered 2013-09-09 (×2): 1 mg via INTRAVENOUS
  Filled 2013-09-09 (×2): qty 1

## 2013-09-09 MED ORDER — POTASSIUM CHLORIDE IN NACL 20-0.9 MEQ/L-% IV SOLN
INTRAVENOUS | Status: DC
  Administered 2013-09-09: 23:00:00 via INTRAVENOUS

## 2013-09-09 MED ORDER — ONDANSETRON HCL 4 MG/2ML IJ SOLN
4.0000 mg | Freq: Four times a day (QID) | INTRAMUSCULAR | Status: DC | PRN
  Administered 2013-09-10 (×3): 4 mg via INTRAVENOUS
  Filled 2013-09-09 (×3): qty 2

## 2013-09-09 MED ORDER — SODIUM CHLORIDE 0.9 % IV SOLN
Freq: Once | INTRAVENOUS | Status: AC
  Administered 2013-09-09: 20:00:00 via INTRAVENOUS

## 2013-09-09 MED ORDER — ALPRAZOLAM 1 MG PO TABS
1.0000 mg | ORAL_TABLET | Freq: Two times a day (BID) | ORAL | Status: DC
  Administered 2013-09-09 – 2013-09-10 (×2): 1 mg via ORAL
  Filled 2013-09-09 (×2): qty 1

## 2013-09-09 MED ORDER — ACETAMINOPHEN 325 MG PO TABS: 650.0000 mg | ORAL_TABLET | Freq: Four times a day (QID) | ORAL | Status: DC | PRN

## 2013-09-09 MED ORDER — PROMETHAZINE HCL 25 MG/ML IJ SOLN
12.5000 mg | Freq: Once | INTRAMUSCULAR | Status: AC
  Administered 2013-09-09: 12.5 mg via INTRAVENOUS
  Filled 2013-09-09: qty 1

## 2013-09-09 MED ORDER — LEVOTHYROXINE SODIUM 25 MCG PO TABS
125.0000 ug | ORAL_TABLET | Freq: Every day | ORAL | Status: DC
Start: 2013-09-10 — End: 2013-09-10
  Administered 2013-09-10: 08:00:00 125 ug via ORAL
  Filled 2013-09-09 (×2): qty 1

## 2013-09-09 MED ORDER — PANTOPRAZOLE SODIUM 40 MG IV SOLR
40.0000 mg | Freq: Every day | INTRAVENOUS | Status: DC
  Administered 2013-09-09: 40 mg via INTRAVENOUS
  Filled 2013-09-09: qty 40

## 2013-09-09 NOTE — ED Notes (Addendum)
abd pain, vomiting, no diarrhea , sent by DR Gerda Diss.  Seen at [facility] 9/29

## 2013-09-09 NOTE — ED Notes (Signed)
Pt ambulated to restroom without assistance. Steady gait noted. PIV in left wrist noted to be blanched at site and surrounding her forearm,. Blood return noted. No edema noted at site. IV fluid stopped at this time per pt request.

## 2013-09-09 NOTE — H&P (Signed)
PCP:   Lilyan Punt, MD   Chief Complaint:  Nausea vomiting  HPI: 47 year old female with history of chronic cervical neck pain, hypothyroidism, anxiety who came to the ED with chief complaint of nausea and vomiting which started 3 days ago. Patient was seen at the [facility] on 09/08/2013 be a CT scan abdomen as well as ultrasound were done as per patient which were normal. Patient decided to leave AMA from [facility] as she was feeling better and did not want to be admitted. But she has been vomiting every 2 hours and has been unable to keep anything down as per patient. She also has diffuse abdominal pain. She denies diarrhea, denies fever no chest pain shortness of breath no dysuria urgency or frequency of urination. Patient has been on chronic opiates, and is trying to wean off to a minimum does which will control the pain.  Allergies:   Allergies  Allergen Reactions  . Adhesive [Tape]     Unknown  . Cymbalta [Duloxetine Hcl] Other (See Comments)    Makes my head do crazy things  . Ivp Dye [Iodinated Diagnostic Agents]     Unknown  . Other     Contrast/IV dye      Past Medical History  Diagnosis Date  . Post concussion syndrome 1999  . 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 Cervical pain (neck)     chronic  . Hypothyroidism   . Anxiety   . Arthritis     right hip    Past Surgical History  Procedure Laterality Date  . Cervix surgery  10/11  . Cardiovascular stress test  03/12    normal  . Tubal ligation Bilateral   . Knee surgery Left 12/89  . Back surgery      cervical fusion  . Abdominal hysterectomy      Prior to Admission medications   Medication Sig Start Date End Date Taking? Authorizing Provider  ALPRAZolam Prudy Feeler) 1 MG tablet Take 1 tablet (1 mg total) by mouth 2 (two) times daily. 08/14/13  Yes Babs Sciara, MD  aspirin 81 MG tablet Take 1 tablet (81 mg total) by mouth daily. 07/29/13  Yes Shanker Levora Dredge, MD  baclofen  (LIORESAL) 10 MG tablet Take 10 mg by mouth 3 (three) times daily.   Yes Historical Provider, MD  Cholecalciferol (VITAMIN D3) 5000 UNITS CAPS Take 1 tablet by mouth daily.   Yes Historical Provider, MD  levothyroxine (SYNTHROID, LEVOTHROID) 125 MCG tablet Take 1 tablet (125 mcg total) by mouth daily before breakfast. 08/22/13  Yes Babs Sciara, MD  metoprolol succinate (TOPROL-XL) 25 MG 24 hr tablet Take 25 mg by mouth daily.   Yes Historical Provider, MD  ondansetron (ZOFRAN ODT) 4 MG disintegrating tablet Take 1 tablet (4 mg total) by mouth every 8 (eight) hours as needed for nausea. 08/15/13  Yes Merlyn Albert, MD  oxyCODONE-acetaminophen (PERCOCET) 10-325 MG per tablet Take 1 tablet by mouth every 6 (six) hours as needed for pain. 08/14/13 08/14/14 Yes Babs Sciara, MD    Social History:  reports that she has been smoking Cigarettes.  She has a 30 pack-year smoking history. She does not have any smokeless tobacco history on file. She reports that she does not drink alcohol or use illicit drugs.  Family History  Problem Relation Age of Onset  . Coronary artery disease Mother 69    MI     All the positives are listed in BOLD  Review of Systems:  HEENT: Headache, blurred vision, runny nose, sore throat Neck: Hypothyroidism, hyperthyroidism,,lymphadenopathy Chest : Shortness of breath, history of COPD, Asthma Heart : Chest pain, history of coronary arterey disease GI:  Nausea, vomiting, diarrhea, constipation, GERD GU: Dysuria, urgency, frequency of urination, hematuria Neuro: Stroke, seizures, syncope Psych: Depression, anxiety, hallucinations   Physical Exam: Blood pressure 97/64, pulse 70, temperature 98.8 F (37.1 C), temperature source Oral, resp. rate 17, height 5\' 7"  (1.702 m), weight 61.236 kg (135 lb), SpO2 99.00%. Constitutional:   Patient is a well-developed and well-nourished female* in no acute distress and cooperative with exam. Head: Normocephalic and  atraumatic Mouth: Mucus membranes moist Eyes: PERRL, EOMI, conjunctivae normal Neck: Supple, No Thyromegaly Cardiovascular: RRR, S1 normal, S2 normal Pulmonary/Chest: CTAB, no wheezes, rales, or rhonchi Abdominal: Soft. Mild diffuse abdominal tenderness, positive epigastric tenderness, non-distended, bowel sounds are normal, no masses, organomegaly, or guarding present.  Neurological: A&O x3, Strenght is normal and symmetric bilaterally, cranial nerve II-XII are grossly intact, no focal motor deficit, sensory intact to light touch bilaterally.  Extremities : No Cyanosis, Clubbing or Edema   Labs on Admission:  Results for orders placed during the hospital encounter of 09/09/13 (from the past 48 hour(s))  CBC WITH DIFFERENTIAL     Status: Abnormal   Collection Time    09/09/13  5:27 PM      Result Value Range   WBC 12.9 (*) 4.0 - 10.5 K/uL   RBC 4.43  3.87 - 5.11 MIL/uL   Hemoglobin 14.0  12.0 - 15.0 g/dL   HCT 95.6  21.3 - 08.6 %   MCV 92.6  78.0 - 100.0 fL   MCH 31.6  26.0 - 34.0 pg   MCHC 34.1  30.0 - 36.0 g/dL   RDW 57.8  46.9 - 62.9 %   Platelets 247  150 - 400 K/uL   Neutrophils Relative % 71  43 - 77 %   Neutro Abs 9.2 (*) 1.7 - 7.7 K/uL   Lymphocytes Relative 22  12 - 46 %   Lymphs Abs 2.9  0.7 - 4.0 K/uL   Monocytes Relative 6  3 - 12 %   Monocytes Absolute 0.7  0.1 - 1.0 K/uL   Eosinophils Relative 0  0 - 5 %   Eosinophils Absolute 0.0  0.0 - 0.7 K/uL   Basophils Relative 1  0 - 1 %   Basophils Absolute 0.1  0.0 - 0.1 K/uL  COMPREHENSIVE METABOLIC PANEL     Status: Abnormal   Collection Time    09/09/13  5:27 PM      Result Value Range   Sodium 140  135 - 145 mEq/L   Potassium 3.4 (*) 3.5 - 5.1 mEq/L   Chloride 102  96 - 112 mEq/L   CO2 24  19 - 32 mEq/L   Glucose, Bld 107 (*) 70 - 99 mg/dL   BUN 15  6 - 23 mg/dL   Creatinine, Ser 5.28  0.50 - 1.10 mg/dL   Calcium 9.7  8.4 - 41.3 mg/dL   Total Protein 7.7  6.0 - 8.3 g/dL   Albumin 4.4  3.5 - 5.2 g/dL   AST  32  0 - 37 U/L   ALT 24  0 - 35 U/L   Alkaline Phosphatase 92  39 - 117 U/L   Total Bilirubin 0.8  0.3 - 1.2 mg/dL   GFR calc non Af Amer >90  >90 mL/min   GFR calc Af Amer >  90  >90 mL/min   Comment: (NOTE)     The eGFR has been calculated using the CKD EPI equation.     This calculation has not been validated in all clinical situations.     eGFR's persistently <90 mL/min signify possible Chronic Kidney     Disease.  LIPASE, BLOOD     Status: None   Collection Time    09/09/13  5:28 PM      Result Value Range   Lipase 53  11 - 59 U/L  URINALYSIS, ROUTINE W REFLEX MICROSCOPIC     Status: Abnormal   Collection Time    09/09/13  6:20 PM      Result Value Range   Color, Urine AMBER (*) YELLOW   Comment: BIOCHEMICALS MAY BE AFFECTED BY COLOR   APPearance HAZY (*) CLEAR   Specific Gravity, Urine >1.030 (*) 1.005 - 1.030   pH 6.0  5.0 - 8.0   Glucose, UA NEGATIVE  NEGATIVE mg/dL   Hgb urine dipstick SMALL (*) NEGATIVE   Bilirubin Urine MODERATE (*) NEGATIVE   Ketones, ur 40 (*) NEGATIVE mg/dL   Protein, ur TRACE (*) NEGATIVE mg/dL   Urobilinogen, UA 0.2  0.0 - 1.0 mg/dL   Nitrite NEGATIVE  NEGATIVE   Leukocytes, UA NEGATIVE  NEGATIVE  URINE MICROSCOPIC-ADD ON     Status: Abnormal   Collection Time    09/09/13  6:20 PM      Result Value Range   Squamous Epithelial / LPF FEW (*) RARE   WBC, UA 3-6  <3 WBC/hpf   Bacteria, UA FEW (*) RARE    Radiological Exams on Admission: No results found.  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 Zofran when necessary for nausea vomiting Will obtain the records from the [facility] as patient had CT scan abdomen as well as ultrasound done over there  Hypothyroidism Continue  Synthroid  Hypokalemia Replace potassium and check BMP in the morning  Hypertension Continue metoprolol  Anxiety Continue Xanax.  Chronic back pain Continue baclofen 10 mg by mouth 3 times a day We'll also start Dilaudid 1 mg every 4 hours when necessary  DVT prophylaxis Lovenox   Code status:Presumed full code  Family discussion:Discussed with husband at bedside   Time Spent on Admission: 75 min  LAMA,GAGAN S [facility] Pager: [phone] 09/09/2013, 9:40 PM  If 7PM-7AM, please contact night-coverage  [email or web address]  Password [email or web address]

## 2013-09-09 NOTE — Progress Notes (Signed)
  Subjective:    Patient ID: Melanie Shepard, female    DOB: 10/21/1966, 47 y.o.   MRN: [id number]  Emesis  This is a new problem. The current episode started in the past 7 days. The problem occurs more than 10 times per day. The problem has been gradually worsening. The emesis has an appearance of stomach contents. There has been no fever. Associated symptoms include abdominal pain. Pertinent negatives include no chest pain, coughing, diarrhea or fever. She has tried nothing for the symptoms. The treatment provided no relief.  Patient was seen at [facility] ER on 09/08/13 for symptoms. Patient brought discharge paperwork today. Patient was told by the ER that she should stay and be admitted. Patient did not want to and left she relates unable to keep anything down since then. Started feeling bad on Saturday vomiting occurred on Sunday and become very progressive. Denies diarrhea. Patient has a history of anxiety issues, COPD, chronic neck pain, chronic opioid use, history of smoking, hypothyroidism, tachycardia-beta blocker use Patient relates she's been unable to keep any of her medicines down for 3 days.  Review of Systems  Constitutional: Positive for fatigue. Negative for fever, activity change and appetite change.  HENT: Negative for congestion, rhinorrhea and postnasal drip.   Respiratory: Negative for apnea, cough, choking and chest tightness.   Cardiovascular: Negative for chest pain and leg swelling.  Gastrointestinal: Positive for nausea, vomiting and abdominal pain. Negative for diarrhea.   Patient's weight on a visit 3 weeks ago was 143 pounds. Current weight 135 pounds.    Objective:   Physical Exam Mucous membranes tacky but not dry Neck is supple Patient alert but looks to feel bad Lungs clear no crackles Heart rate mid 90s Abdomen soft with moderate tenderness in the epigastric mid epigastrium and right upper quadrant region Extremities no edema Urinalysis with  ketones, some epithelial cells. Orthostatics blood pressure 104/64 laying 98/60 sitting 96/56 standing Patient requires the help of her husband to walk      Assessment & Plan:  Abdominal pain with intractable vomiting-this patient would benefit from IV fluids she may also need ultrasound and repeat blood work more than likely will need admission into the hospital. The ER physician spoken with today. Husband will take her to the ER  Because the patient has not been able to keep her medications down she is having significant pain and discomfort. I believe she is also starting to have some withdrawal because her body is used to taking her Percocet from 4-6 times every day. She will probably need some IV pain medicine while in the hospital until she is able to tolerate oral medication.

## 2013-09-09 NOTE — ED Provider Notes (Signed)
CSN: [id number]     Arrival date & time 09/09/13  1536 History  This chart was scribed for Roney Marion, MD by Blanchard Kelch, ED Scribe. The patient was seen in room APA11/APA11. Patient's care was started at 4:45 PM.      Chief Complaint  Patient presents with  . Abdominal Pain   Patient is a 47 y.o. female presenting with abdominal pain. The history is provided by the patient. No language interpreter was used.  Abdominal Pain Associated symptoms: nausea and vomiting   Associated symptoms: no chest pain, no chills, no cough, no diarrhea, no dysuria, no fatigue, no fever, no hematuria, no shortness of breath and no sore throat     HPI Comments: Shakeira Kaylor is a 47 y.o. female who presents to the Emergency Department complaining of constant abdominal pain with associated nausea that began three days ago. She was seen at [facility] on 9/29 but decided not to be admitted AMA. She had CT and ultrasound done that were normal. Her husband endorses this history.  She has had associated constant vomiting since leaving the ED yesterday early morning.Marland Kitchen She was sent here by her PCP, Dr. Gerda Diss. She denies doing anything abnormal prior to the symptoms beginning. She denies any prior similar episodes. She has a past pertinent surgical history of  hysterectomy, cholecystectomy, and appendectomy. She reports an allergy to the CT contrast dye.    Past Medical History  Diagnosis Date  . Post concussion syndrome 1999  . 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 Cervical pain (neck)     chronic  . Hypothyroidism   . Anxiety   . Arthritis     right hip   Past Surgical History  Procedure Laterality Date  . Cervix surgery  10/11  . Cardiovascular stress test  03/12    normal  . Tubal ligation Bilateral   . Knee surgery Left 12/89  . Back surgery      cervical fusion  . Abdominal hysterectomy     Family History  Problem Relation Age of Onset  . Coronary artery disease Mother 76    MI    History  Substance Use Topics  . Smoking status: Current Every Day Smoker -- 1.00 packs/day for 30 years    Types: Cigarettes  . Smokeless tobacco: Not on file  . Alcohol Use: No   OB History   Grav Para Term Preterm Abortions TAB SAB Ect Mult Living                 Review of Systems  Constitutional: Negative for fever, chills, diaphoresis, appetite change and fatigue.  HENT: Negative for sore throat, mouth sores and trouble swallowing.   Eyes: Negative for visual disturbance.  Respiratory: Negative for cough, chest tightness, shortness of breath and wheezing.   Cardiovascular: Negative for chest pain.  Gastrointestinal: Positive for nausea, vomiting and abdominal pain. Negative for diarrhea and abdominal distention.  Endocrine: Negative for polydipsia, polyphagia and polyuria.  Genitourinary: Negative for dysuria, frequency and hematuria.  Musculoskeletal: Negative for gait problem.  Skin: Negative for color change, pallor and rash.  Neurological: Negative for dizziness, syncope, light-headedness and headaches.  Hematological: Does not bruise/bleed easily.  Psychiatric/Behavioral: Negative for behavioral problems and confusion.    Allergies  Adhesive; Cymbalta; Ivp dye; and Other  Home Medications   Current Outpatient Rx  Name  Route  Sig  Dispense  Refill  . ALPRAZolam (XANAX) 1 MG tablet   Oral   Take 1  tablet (1 mg total) by mouth 2 (two) times daily.   60 tablet   3   . aspirin 81 MG tablet   Oral   Take 1 tablet (81 mg total) by mouth daily.         . baclofen (LIORESAL) 10 MG tablet   Oral   Take 10 mg by mouth 3 (three) times daily.         . Cholecalciferol (VITAMIN D3) 5000 UNITS CAPS   Oral   Take 1 tablet by mouth daily.         Marland Kitchen levothyroxine (SYNTHROID, LEVOTHROID) 125 MCG tablet   Oral   Take 1 tablet (125 mcg total) by mouth daily before breakfast.   30 tablet   5     Cancel any refills on the levothyroxine 100 mcg.   .  metoprolol succinate (TOPROL-XL) 25 MG 24 hr tablet   Oral   Take 25 mg by mouth daily.         . ondansetron (ZOFRAN ODT) 4 MG disintegrating tablet   Oral   Take 1 tablet (4 mg total) by mouth every 8 (eight) hours as needed for nausea.   24 tablet   0   . oxyCODONE-acetaminophen (PERCOCET) 10-325 MG per tablet   Oral   Take 1 tablet by mouth every 6 (six) hours as needed for pain.   120 tablet   0    Triage Vitals: BP 107/71  Pulse 86  Temp(Src) 98.8 F (37.1 C) (Oral)  Resp 20  Ht 5\' 7"  (1.702 m)  Wt 135 lb (61.236 kg)  BMI 21.14 kg/m2  SpO2 97%  Physical Exam  Nursing note and vitals reviewed. Constitutional: She is oriented to person, place, and time. She appears well-developed and well-nourished. No distress.  HENT:  Head: Normocephalic.  Mouth/Throat: Mucous membranes are dry.  Eyes: Conjunctivae are normal. Pupils are equal, round, and reactive to light. No scleral icterus.  Neck: Normal range of motion. Neck supple. No thyromegaly present.  Cardiovascular: Normal rate and regular rhythm.  Exam reveals no gallop and no friction rub.   No murmur heard. Pulmonary/Chest: Effort normal and breath sounds normal. No respiratory distress. She has no wheezes. She has no rales.  Abdominal: Soft. She exhibits no distension. There is tenderness (diffuse). There is no rebound and no guarding.  Hyperactive bowel sounds.   Musculoskeletal: Normal range of motion.  Neurological: She is alert and oriented to person, place, and time.  Skin: Skin is warm and dry. No rash noted.  Psychiatric: She has a normal mood and affect. Her behavior is normal.    ED Course  Procedures (including critical care time)  DIAGNOSTIC STUDIES: Oxygen Saturation is 97% on room air, adequate by my interpretation.    COORDINATION OF CARE: 4:51 PM -Will order IV fluids, Zofran, Protonix, and Dilaudid injections, CBC, CMP, blood lipase, and urinalysis. Patient verbalizes understanding and agrees  with treatment plan.    Labs Review Labs Reviewed  CBC WITH DIFFERENTIAL - Abnormal; Notable for the following:    WBC 12.9 (*)    Neutro Abs 9.2 (*)    All other components within normal limits  COMPREHENSIVE METABOLIC PANEL - Abnormal; Notable for the following:    Potassium 3.4 (*)    Glucose, Bld 107 (*)    All other components within normal limits  URINALYSIS, ROUTINE W REFLEX MICROSCOPIC - Abnormal; Notable for the following:    Color, Urine AMBER (*)    APPearance HAZY (*)  Specific Gravity, Urine >1.030 (*)    Hgb urine dipstick SMALL (*)    Bilirubin Urine MODERATE (*)    Ketones, ur 40 (*)    Protein, ur TRACE (*)    All other components within normal limits  URINE MICROSCOPIC-ADD ON - Abnormal; Notable for the following:    Squamous Epithelial / LPF FEW (*)    Bacteria, UA FEW (*)    All other components within normal limits  URINE CULTURE  LIPASE, BLOOD   Imaging Review No results found.  MDM   1. Vomiting   2. Ileus    Pt continues with nausea, vomiting.  Labs reassuring.  Discussed with [facility].  Plan is admit for ivf, antiemetics.   I personally performed the services described in this documentation, which was scribed in my presence. The recorded information has been reviewed and is accurate.    Roney Marion, MD 09/09/13 2108

## 2013-09-10 ENCOUNTER — Ambulatory Visit: Payer: BC Managed Care – PPO | Admitting: Neurology

## 2013-09-10 ENCOUNTER — Other Ambulatory Visit: Payer: Self-pay | Admitting: Family Medicine

## 2013-09-10 ENCOUNTER — Telehealth: Payer: Self-pay | Admitting: Family Medicine

## 2013-09-10 LAB — COMPREHENSIVE METABOLIC PANEL
ALT: 32 U/L (ref 0–35)
AST: 33 U/L (ref 0–37)
BUN: 12 mg/dL (ref 6–23)
CO2: 25 mEq/L (ref 19–32)
Calcium: 8.7 mg/dL (ref 8.4–10.5)
Chloride: 109 mEq/L (ref 96–112)
Creatinine, Ser: 0.8 mg/dL (ref 0.50–1.10)
GFR calc Af Amer: >90 mL/min (ref >90)
GFR calc non Af Amer: 86 mL/min — ABNORMAL LOW (ref >90)
Glucose, Bld: 83 mg/dL (ref 70–99)
Total Bilirubin: 0.9 mg/dL (ref 0.3–1.2)
Total Protein: 6 g/dL (ref 6.0–8.3)

## 2013-09-10 LAB — CBC
HCT: 37.9 % (ref 36.0–46.0)
Hemoglobin: 12.8 g/dL (ref 12.0–15.0)
MCH: 31.4 pg (ref 26.0–34.0)
MCV: 93.1 fL (ref 78.0–100.0)
Platelets: 217 10*3/uL (ref 150–400)
RBC: 4.07 MIL/uL (ref 3.87–5.11)
RDW: 14.7 % (ref 11.5–15.5)
WBC: 11.3 10*3/uL — ABNORMAL HIGH (ref 4.0–10.5)

## 2013-09-10 MED ORDER — OXYCODONE-ACETAMINOPHEN 10-325 MG PO TABS: 1.0000 | ORAL_TABLET | Freq: Four times a day (QID) | ORAL | Status: DC | PRN

## 2013-09-10 MED ORDER — OXYCODONE HCL 5 MG PO TABS
5.0000 mg | ORAL_TABLET | Freq: Four times a day (QID) | ORAL | Status: DC | PRN
  Administered 2013-09-10: 5 mg via ORAL
  Filled 2013-09-10: qty 1

## 2013-09-10 MED ORDER — OXYCODONE-ACETAMINOPHEN 5-325 MG PO TABS
1.0000 | ORAL_TABLET | Freq: Four times a day (QID) | ORAL | Status: DC | PRN
  Administered 2013-09-10: 1 via ORAL
  Filled 2013-09-10: qty 1

## 2013-09-10 NOTE — Telephone Encounter (Signed)
Patient called at 11:30 am want you to call her,she has some concerns she wants to discuss with you. She in room 321 at [facility].

## 2013-09-10 NOTE — Discharge Summary (Signed)
Physician Discharge Summary  Melanie Shepard ZOX:[id number] DOB: 1966-05-19 DOA: 09/09/2013  PCP: Lilyan Punt, MD  Admit date: 09/09/2013 Discharge date: 09/10/2013  Recommendations for Outpatient Follow-up:  1. Followup resolution of gastroenteritis  Follow-up Information   Follow up with LUKING,SCOTT, MD In 1 week.   Specialty:  Family Medicine   Contact information:   [address] [place] [address] [phone]      Discharge Diagnoses:  1. Nausea, vomiting, abdominal pain, presumed viral gastroenteritis  Discharge Condition: Improved Disposition: Home  Diet recommendation: Regular  Filed Weights   09/09/13 1549 09/09/13 2256  Weight: 61.236 kg (135 lb) 66.18 kg (145 lb 14.4 oz)    History of present illness:  47 year old woman presented to the emergency department with nausea, vomiting for 3 days. Seen at the [facility] on 09/08/2013, reportedly had negative CT abdomen as well as x-ray. Left AMA. Admitted for nausea, vomiting suspected to be secondary to gastroenteritis.  Hospital Course:  [name] was treated with supportive care, antiemetics and pain control. Vomiting resolved rapidly and she tolerated first liquids and a solid diet for lunch. She had one episode of diarrhea but otherwise is feeling quite well and would like to go home. Records were obtained from [facility]--CT of the abdomen and pelvis was unremarkable. X-ray of the abdomen was unremarkable as well. Note the patient did not have an ultrasound. We discussed further observation overnight versus early discharge. The patient requested early discharge. Her husband concurred. If she can tolerate dinner tonight plan discharge home. Her  1. Nausea, vomiting, abdominal pain: Vomiting and abdominal pain have resolved. Tolerating clear liquids. Wants to advance to solid diet. Etiology unclear but suspect viral gastroenteritis. Narcotic withdrawal considered but does not appear  likely by history. 2. Hypokalemia: Repleted. 3. Chronic neck pain, chronic opioid use. Patient has been reducing her use of opioids under the supervision of her physician for the last several months. 4. Hypertension: Stable.  5. Anxiety 6. Hypothyroidism: Appears stable. 7. History of hysterectomy, cholecystectomy, appendectomy, allergies contrast dye  Consultants:  none Procedures:  none Antibiotics:  none   Discharge Instructions  Discharge Orders   Future Orders Complete By Expires   Activity as tolerated - No restrictions  As directed    Diet general  As directed    Discharge instructions  As directed    Comments:     Call physician or seek immediate medical assistance for recurrent vomiting, diarrhea or abdominal pain or worsening of condition.       Medication List         ALPRAZolam 1 MG tablet  Commonly known as:  XANAX  Take 1 tablet (1 mg total) by mouth 2 (two) times daily.     aspirin 81 MG tablet  Take 1 tablet (81 mg total) by mouth daily.     baclofen 10 MG tablet  Commonly known as:  LIORESAL  Take 10 mg by mouth 3 (three) times daily.     levothyroxine 125 MCG tablet  Commonly known as:  SYNTHROID, LEVOTHROID  Take 1 tablet (125 mcg total) by mouth daily before breakfast.     metoprolol succinate 25 MG 24 hr tablet  Commonly known as:  TOPROL-XL  Take 25 mg by mouth daily.     ondansetron 4 MG disintegrating tablet  Commonly known as:  ZOFRAN ODT  Take 1 tablet (4 mg total) by mouth every 8 (eight) hours as needed for nausea.     oxyCODONE-acetaminophen 10-325  MG per tablet  Commonly known as:  PERCOCET  Take 1 tablet by mouth every 6 (six) hours as needed for pain.     Vitamin D3 5000 UNITS Caps  Take 1 tablet by mouth daily.       Allergies  Allergen Reactions  . Adhesive [Tape]     Unknown  . Cymbalta [Duloxetine Hcl] Other (See Comments)    Makes my head do crazy things  . Ivp Dye [Iodinated Diagnostic Agents]     Unknown  .  Other     Contrast/IV dye    The results of significant diagnostics from this hospitalization (including imaging, microbiology, ancillary and laboratory) are listed below for reference.     Labs: Basic Metabolic Panel:  Recent Labs Lab 09/09/13 1727 09/10/13 0608  NA 140 141  K 3.4* 3.2*  CL 102 109  CO2 24 25  GLUCOSE 107* 83  BUN 15 12  CREATININE 0.75 0.80  CALCIUM 9.7 8.7   Liver Function Tests:  Recent Labs Lab 09/09/13 1727 09/10/13 0608  AST 32 33  ALT 24 32  ALKPHOS 92 73  BILITOT 0.8 0.9  PROT 7.7 6.0  ALBUMIN 4.4 3.4*    Recent Labs Lab 09/09/13 1728  LIPASE 53   CBC:  Recent Labs Lab 09/09/13 1727 09/10/13 0608  WBC 12.9* 11.3*  NEUTROABS 9.2*  --   HGB 14.0 12.8  HCT 41.0 37.9  MCV 92.6 93.1  PLT 247 217    Active Problems:   Chronic cervical pain after C-spine surgey   Generalized anxiety disorder   Unspecified hypothyroidism   Gastroenteritis   Time coordinating discharge: 25 minutes  Signed:  Brendia Sacks, MD [facility] 09/10/2013, 3:13 PM

## 2013-09-10 NOTE — Progress Notes (Signed)
[facility] PROGRESS NOTE  Brad Roussell ONG:[id number] DOB: [birth year] DOA: 09/09/2013 PCP: Lilyan Punt, MD  Assessment/Plan: 1. Nausea, vomiting, abdominal pain: Vomiting and abdominal pain have resolved. Tolerating clear liquids. Wants to advance to solid diet. By report negative CT abdomen pelvis and abdominal ultrasound 9/29 in [place]. Report has been solicited but not currently available. Etiology unclear but suspect viral gastroenteritis. Narcotic withdrawal considered but does not appear likely by history. 2. Hypokalemia: Replete. 3. Chronic neck pain, chronic opioid use. Patient has been reducing her use of opioids under the supervision of her physician for the last several months. 4. Hypertension: Stable.  5. Anxiety 6. Hypothyroidism: Appears stable. 7. History of hysterectomy, cholecystectomy, appendectomy, allergies contrast dye   Records from [place] have been requested  Advance diet  If tolerates diet anticipate discharge later today  Discussed above with husband at bedside  Pending studies:   Urine culture  Code Status: full code DVT prophylaxis: Lovenox Family Communication: as above Disposition Plan:   Brendia Sacks, MD  [facility]  Pager [phone] If 7PM-7AM, please contact night-coverage at [email or web address], password [facility] 09/10/2013, 10:30 AM  LOS: 1 day   Summary: 47 year old woman presented to the emergency department with nausea, vomiting for 3 days. Seen at the [facility] on 09/08/2013 , reported that he had negative CT abdomen as well as ultrasound. Left AMA. Has been on chronic opiates and is trying to wean off. Admitted for nausea, vomiting suspected to be secondary to gastroenteritis.  Consultants:  none  Procedures:  none  Antibiotics:  none  HPI/Subjective: Currently feels much better. Some nausea but no vomiting. Minimal abdominal soreness. No abdominal pain. No diarrhea. Tolerating clear  liquids.  Objective: Filed Vitals:   09/09/13 1549 09/09/13 1817 09/09/13 2256 09/10/13 0612  BP: 107/71 97/64 100/62 102/69  Pulse: 86 70 65 69  Temp: 98.8 F (37.1 C)  98.5 F (36.9 C) 98.3 F (36.8 C)  TempSrc: Oral  Oral Oral  Resp: 20 17 20 20   Height: 5\' 7"  (1.702 m)  5\' 7"  (1.702 m)   Weight: 61.236 kg (135 lb)  66.18 kg (145 lb 14.4 oz)   SpO2: 97% 99% 100% 97%    Intake/Output Summary (Last 24 hours) at 09/10/13 1030 Last data filed at 09/10/13 0612  Gross per 24 hour  Intake  942.5 ml  Output     50 ml  Net  892.5 ml     Filed Weights   09/09/13 1549 09/09/13 2256  Weight: 61.236 kg (135 lb) 66.18 kg (145 lb 14.4 oz)    Exam:   Afebrile, vital signs stable  General: Appears calm and comfortable. Ambulates without difficulty.  Cardiovascular: Regular rate and rhythm. No murmur, rub, gallop.  Respiratory: Clear to auscultation bilaterally. No wheezes, rales, rhonchi. Normal respiratory effort.  Abdomen: Soft, nontender, nondistended. No masses appreciated. Positive bowel sounds.  Data Reviewed:  Potassium 3.2. Hepatic panel unremarkable.   Scheduled Meds: . ALPRAZolam  1 mg Oral BID  . baclofen  10 mg Oral TID  . enoxaparin (LOVENOX) injection  40 mg Subcutaneous Q24H  . levothyroxine  125 mcg Oral QAC breakfast  . metoprolol succinate  25 mg Oral Daily  . pantoprazole (PROTONIX) IV  40 mg Intravenous QHS   Continuous Infusions: . 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

## 2013-09-10 NOTE — Telephone Encounter (Signed)
Rx printed and left up front for pick up

## 2013-09-10 NOTE — Progress Notes (Signed)
UR Chart Review Completed  

## 2013-09-10 NOTE — Telephone Encounter (Signed)
Patient notified

## 2013-09-10 NOTE — Telephone Encounter (Signed)
Patient wants her monthly Rx of oxycodone  - last filled 09/13/13

## 2013-09-10 NOTE — Care Management Note (Unsigned)
    Page 1 of 1   09/10/2013     10:05:48 AM   CARE MANAGEMENT NOTE 09/10/2013  Patient:  Melanie Shepard, Melanie Shepard   Account Number:  [id number]  Date Initiated:  09/10/2013  Documentation initiated by:  Rosemary Holms  Subjective/Objective Assessment:   Pt admitted from MD Luking's office. Lives at home with her spouse.Marland Kitchen No HH needs identified at this time.     Action/Plan:   Anticipated DC Date:  09/12/2013   Anticipated DC Plan:  HOME/SELF CARE      DC Planning Services  CM consult      Choice offered to / List presented to:             Status of service:  In process, will continue to follow Medicare Important Message given?   (If response is "NO", the following Medicare IM given date fields will be blank) Date Medicare IM given:   Date Additional Medicare IM given:    Discharge Disposition:    Per UR Regulation:    If discussed at Long Length of Stay Meetings, dates discussed:    Comments:  09/10/13 Rosemary Holms RN BSN CM

## 2013-09-10 NOTE — Progress Notes (Signed)
Patient with discharge orders. Discharge instructions given, patient verbalized understanding. Prescriptions given. Patient stable. Patient left with spouse in private vehicle.

## 2013-09-10 NOTE — Telephone Encounter (Signed)
Pt called from Hospital stating she needed to return a Nurse call, Nurses advised they had not called her. They then spoke with Dr Lorin Picket whom stated he wasn't sure why she would be calling. So I spoke with Melanie Shepard whom wanted to know why a nurse was refusing to talk to her. Advised not refusing to and that they are very busy at this time, I would be happy to take a mssg for her to give to Dr Lorin Picket. She essentially only wants to know since she is being released from the Hospital today if Dr Lorin Picket can go ahead an refill all her pain meds that are due to be refilled since she didn't get them yesterday at her office visit. Please advise Pt if you can do this not knowing what meds the Hospital sent her home with?

## 2013-09-10 NOTE — Telephone Encounter (Signed)
She may have her prescription. Percocet 10 mg/325. 1 every 6 hours as needed. #120. May fill 09/12/2013.

## 2013-09-10 NOTE — Telephone Encounter (Signed)
Patient was notified that her rx for pain med was ready for pick up and i informed patient that you were keeping up with her status and to follow up after discharge as directed.

## 2013-09-11 ENCOUNTER — Emergency Department (HOSPITAL_COMMUNITY): Payer: BC Managed Care – PPO

## 2013-09-11 ENCOUNTER — Inpatient Hospital Stay (HOSPITAL_COMMUNITY)
Admission: EM | Admit: 2013-09-11 | Discharge: 2013-09-15 | DRG: 813 | Disposition: A | Discharge status: Home / Self Care | Payer: BC Managed Care – PPO | Attending: Family Medicine | Admitting: Family Medicine

## 2013-09-11 ENCOUNTER — Encounter (HOSPITAL_COMMUNITY): Payer: Self-pay | Admitting: *Deleted

## 2013-09-11 ENCOUNTER — Telehealth: Payer: Self-pay | Admitting: Family Medicine

## 2013-09-11 DIAGNOSIS — M1610 Unilateral primary osteoarthritis, unspecified hip: Secondary | ICD-10-CM | POA: Diagnosis present

## 2013-09-11 DIAGNOSIS — G8929 Other chronic pain: Secondary | ICD-10-CM | POA: Diagnosis present

## 2013-09-11 DIAGNOSIS — F411 Generalized anxiety disorder: Secondary | ICD-10-CM | POA: Diagnosis present

## 2013-09-11 DIAGNOSIS — G894 Chronic pain syndrome: Secondary | ICD-10-CM | POA: Diagnosis present

## 2013-09-11 DIAGNOSIS — R1110 Vomiting, unspecified: Secondary | ICD-10-CM

## 2013-09-11 DIAGNOSIS — Z981 Arthrodesis status: Secondary | ICD-10-CM

## 2013-09-11 DIAGNOSIS — E039 Hypothyroidism, unspecified: Secondary | ICD-10-CM | POA: Diagnosis present

## 2013-09-11 DIAGNOSIS — A09 Infectious gastroenteritis and colitis, unspecified: Principal | ICD-10-CM | POA: Diagnosis present

## 2013-09-11 DIAGNOSIS — F17200 Nicotine dependence, unspecified, uncomplicated: Secondary | ICD-10-CM | POA: Diagnosis present

## 2013-09-11 DIAGNOSIS — E876 Hypokalemia: Secondary | ICD-10-CM | POA: Diagnosis present

## 2013-09-11 DIAGNOSIS — K5289 Other specified noninfective gastroenteritis and colitis: Secondary | ICD-10-CM

## 2013-09-11 DIAGNOSIS — Z800 Family history of malignant neoplasm of digestive organs: Secondary | ICD-10-CM

## 2013-09-11 DIAGNOSIS — K529 Noninfective gastroenteritis and colitis, unspecified: Secondary | ICD-10-CM | POA: Diagnosis present

## 2013-09-11 DIAGNOSIS — I2510 Atherosclerotic heart disease of native coronary artery without angina pectoris: Secondary | ICD-10-CM | POA: Diagnosis present

## 2013-09-11 DIAGNOSIS — Z8249 Family history of ischemic heart disease and other diseases of the circulatory system: Secondary | ICD-10-CM

## 2013-09-11 DIAGNOSIS — F419 Anxiety disorder, unspecified: Secondary | ICD-10-CM

## 2013-09-11 DIAGNOSIS — D72829 Elevated white blood cell count, unspecified: Secondary | ICD-10-CM | POA: Diagnosis present

## 2013-09-11 LAB — URINALYSIS, ROUTINE W REFLEX MICROSCOPIC
Ketones, ur: 15 mg/dL — AB
Leukocytes, UA: NEGATIVE
Nitrite: NEGATIVE
Protein, ur: NEGATIVE mg/dL
Specific Gravity, Urine: >1.03 — ABNORMAL HIGH (ref 1.005–1.030)
Urobilinogen, UA: 0.2 mg/dL (ref 0.0–1.0)

## 2013-09-11 LAB — CBC WITH DIFFERENTIAL/PLATELET
Basophils Absolute: 0 10*3/uL (ref 0.0–0.1)
Basophils Relative: 0 % (ref 0–1)
Eosinophils Absolute: 0.1 10*3/uL (ref 0.0–0.7)
Lymphs Abs: 2.1 10*3/uL (ref 0.7–4.0)
MCH: 32.1 pg (ref 26.0–34.0)
MCHC: 34.6 g/dL (ref 30.0–36.0)
Neutro Abs: 9.6 10*3/uL — ABNORMAL HIGH (ref 1.7–7.7)
Neutrophils Relative %: 80 % — ABNORMAL HIGH (ref 43–77)
Platelets: 203 10*3/uL (ref 150–400)
RBC: 4.98 MIL/uL (ref 3.87–5.11)

## 2013-09-11 LAB — COMPREHENSIVE METABOLIC PANEL
AST: 20 U/L (ref 0–37)
BUN: 6 mg/dL (ref 6–23)
CO2: 22 mEq/L (ref 19–32)
Calcium: 9.8 mg/dL (ref 8.4–10.5)
Creatinine, Ser: 0.72 mg/dL (ref 0.50–1.10)
GFR calc Af Amer: >90 mL/min (ref >90)
GFR calc non Af Amer: >90 mL/min (ref >90)
Potassium: 3.8 mEq/L (ref 3.5–5.1)
Sodium: 140 mEq/L (ref 135–145)
Total Bilirubin: 0.6 mg/dL (ref 0.3–1.2)
Total Protein: 8.3 g/dL (ref 6.0–8.3)

## 2013-09-11 LAB — URINE CULTURE: Colony Count: NO GROWTH

## 2013-09-11 LAB — URINE MICROSCOPIC-ADD ON

## 2013-09-11 LAB — LIPASE, BLOOD: Lipase: 40 U/L (ref 11–59)

## 2013-09-11 LAB — PREGNANCY, URINE: Preg Test, Ur: NEGATIVE

## 2013-09-11 MED ORDER — METOCLOPRAMIDE HCL 5 MG/ML IJ SOLN
10.0000 mg | Freq: Once | INTRAMUSCULAR | Status: AC
  Administered 2013-09-11: 10 mg via INTRAVENOUS
  Filled 2013-09-11: qty 2

## 2013-09-11 MED ORDER — MORPHINE SULFATE 4 MG/ML IJ SOLN
4.0000 mg | Freq: Once | INTRAMUSCULAR | Status: AC
  Administered 2013-09-11: 4 mg via INTRAVENOUS
  Filled 2013-09-11: qty 1

## 2013-09-11 MED ORDER — SODIUM CHLORIDE 0.9 % IV SOLN
1000.0000 mL | Freq: Once | INTRAVENOUS | Status: AC
  Administered 2013-09-11: 1000 mL via INTRAVENOUS

## 2013-09-11 MED ORDER — HYDROMORPHONE HCL PF 1 MG/ML IJ SOLN
1.0000 mg | INTRAMUSCULAR | Status: DC | PRN
  Filled 2013-09-11: qty 1

## 2013-09-11 MED ORDER — ONDANSETRON HCL 4 MG/2ML IJ SOLN
4.0000 mg | Freq: Once | INTRAMUSCULAR | Status: AC
  Administered 2013-09-11: 4 mg via INTRAVENOUS
  Filled 2013-09-11: qty 2

## 2013-09-11 MED ORDER — PROMETHAZINE HCL 25 MG/ML IJ SOLN
25.0000 mg | Freq: Once | INTRAMUSCULAR | Status: AC
  Administered 2013-09-11: 25 mg via INTRAVENOUS
  Filled 2013-09-11: qty 1

## 2013-09-11 MED ORDER — ONDANSETRON HCL 4 MG/2ML IJ SOLN
4.0000 mg | Freq: Three times a day (TID) | INTRAMUSCULAR | Status: DC | PRN
  Filled 2013-09-11: qty 2

## 2013-09-11 MED ORDER — LORAZEPAM 2 MG/ML IJ SOLN
1.0000 mg | Freq: Once | INTRAMUSCULAR | Status: AC
  Administered 2013-09-11: 1 mg via INTRAVENOUS
  Filled 2013-09-11: qty 1

## 2013-09-11 MED ORDER — DIPHENHYDRAMINE HCL 50 MG/ML IJ SOLN
25.0000 mg | Freq: Once | INTRAMUSCULAR | Status: AC
  Administered 2013-09-11: 25 mg via INTRAVENOUS
  Filled 2013-09-11: qty 1

## 2013-09-11 MED ORDER — OXYCODONE-ACETAMINOPHEN 10-325 MG PO TABS: 1.0000 | ORAL_TABLET | Freq: Four times a day (QID) | ORAL | Status: DC | PRN

## 2013-09-11 MED ORDER — SODIUM CHLORIDE 0.9 % IV SOLN: INTRAVENOUS | Status: DC

## 2013-09-11 MED ORDER — HYDROMORPHONE HCL PF 1 MG/ML IJ SOLN
1.0000 mg | Freq: Once | INTRAMUSCULAR | Status: AC
  Administered 2013-09-11: 1 mg via INTRAVENOUS
  Filled 2013-09-11: qty 1

## 2013-09-11 MED ORDER — ONDANSETRON 8 MG PO TBDP: 8.0000 mg | ORAL_TABLET | Freq: Three times a day (TID) | ORAL | Status: DC | PRN

## 2013-09-11 NOTE — ED Notes (Signed)
Pt states no improvements after medications. Pt requesting more medications.

## 2013-09-11 NOTE — Telephone Encounter (Signed)
Patient is also wondering if she should just go back to [facility]?

## 2013-09-11 NOTE — ED Provider Notes (Signed)
CSN: [id number]     Arrival date & time 09/11/13  1702 History  This chart was scribed for Melanie Octave, MD by Blanchard Kelch, ED Scribe. The patient was seen in room APA11/APA11. Patient's care was started at 5:45 PM.    Chief Complaint  Patient presents with  . Headache    Patient is a 47 y.o. female presenting with headaches. The history is provided by the patient. No language interpreter was used.  Headache   HPI Comments: Melanie Shepard is a 47 y.o. female who presents to the Emergency Department complaining of epigastric pain that began 9/27. She reports episodes of emesis (non-bloody) every two hours today. Her last episode was just prior to arrival. She also has a constant, gradually worsening headache that began this morning upon waking. The headache did not start while she was vomiting. She has been having a small amount of diarrhea. She was admitted to the hospital two days ago for the pain and was discharged yesterday with a diagnosis of gastroenteritis. She was eating prior to leaving the hospital yesterday.She denies recent travel or antibiotic use. She denies fever. She has a past medical history of chronic neck pain. She takes Percocet for the pain but has not been able to take them as well as the Zofran she was discharged with because she has been throwing them up. She has a past surgical history hysterectomy, appendectomy and cholecystectomy. Her ovaries are still intact.   Past Medical History  Diagnosis Date  . Post concussion syndrome 1999  . 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 Cervical pain (neck)     chronic  . Hypothyroidism   . Anxiety   . Arthritis     right hip   Past Surgical History  Procedure Laterality Date  . Cervix surgery  10/11  . Cardiovascular stress test  03/12    normal  . Tubal ligation Bilateral   . Knee surgery Left 12/89  . Back surgery      cervical fusion  . Abdominal hysterectomy     Family History  Problem Relation Age of Onset   . Coronary artery disease Mother 22    MI   History  Substance Use Topics  . Smoking status: Current Every Day Smoker -- 1.00 packs/day for 30 years    Types: Cigarettes  . Smokeless tobacco: Not on file  . Alcohol Use: No   OB History   Grav Para Term Preterm Abortions TAB SAB Ect Mult Living                 Review of Systems  Neurological: Positive for headaches.   A complete 10 system review of systems was obtained and all systems are negative except as noted in the HPI and PMH.    Allergies  Adhesive; Cymbalta; Ivp dye; and Other  Home Medications   No current outpatient prescriptions on file. Triage Vitals: BP 124/76  Pulse 70  Temp(Src) 98.7 F (37.1 C)  Resp 18  Ht 5\' 7"  (1.702 m)  Wt 135 lb (61.236 kg)  BMI 21.14 kg/m2  SpO2 99%  Physical Exam  Nursing note and vitals reviewed. Constitutional: She is oriented to person, place, and time. She appears well-developed and well-nourished. She appears distressed.  uncomfortable  HENT:  Head: Normocephalic and atraumatic.  Mouth/Throat: Oropharynx is clear and moist. No oropharyngeal exudate.  Eyes: EOM are normal. Pupils are equal, round, and reactive to light.  Neck: Normal range of motion. Neck supple.  No  meningismus  Cardiovascular: Normal rate, normal heart sounds and intact distal pulses.   No murmur heard. Pulmonary/Chest: Effort normal and breath sounds normal. No respiratory distress.  Abdominal: Soft. Bowel sounds are normal. She exhibits no distension. There is tenderness.  Epigastric tenderness, no guarding or rebound  Musculoskeletal: Normal range of motion. She exhibits no edema and no tenderness.  Neurological: She is alert and oriented to person, place, and time. She has normal strength. No cranial nerve deficit or sensory deficit. She exhibits normal muscle tone. Coordination normal.  CN 2-12 intact, no ataxia on finger to nose, no nystagmus, 5/5 strength throughout, no pronator drift, Romberg  negative, normal gait.   Skin: Skin is warm and dry. No rash noted.  Psychiatric: She has a normal mood and affect.    ED Course  Procedures (including critical care time)  DIAGNOSTIC STUDIES: Oxygen Saturation is 99% on room air, normal by my interpretation.    COORDINATION OF CARE:  5:50 PM -Will order morphine, Zofran, Ativan and Phenergan injections. Will order abdominal x-ray, head CT, CBC, CMP, blood lipase, Urinalysis, and pregnancy urine test. Patient verbalizes understanding and agrees with treatment plan.  9:38 PM- Re-check with patient. She states she has thrown up while in ED. Will admit. Patient understands and agrees to plan.   Labs Review Labs Reviewed  CBC WITH DIFFERENTIAL - Abnormal; Notable for the following:    WBC 12.1 (*)    Hemoglobin 16.0 (*)    HCT 46.2 (*)    Neutrophils Relative % 80 (*)    Neutro Abs 9.6 (*)    Monocytes Relative 2 (*)    All other components within normal limits  URINALYSIS, ROUTINE W REFLEX MICROSCOPIC - Abnormal; Notable for the following:    Specific Gravity, Urine >1.030 (*)    Hgb urine dipstick TRACE (*)    Ketones, ur 15 (*)    All other components within normal limits  URINE MICROSCOPIC-ADD ON - Abnormal; Notable for the following:    Squamous Epithelial / LPF MANY (*)    Bacteria, UA MANY (*)    All other components within normal limits  CLOSTRIDIUM DIFFICILE BY PCR  COMPREHENSIVE METABOLIC PANEL  LIPASE, BLOOD  PREGNANCY, URINE   Imaging Review Ct Abdomen Pelvis Wo Contrast  09/11/2013   *RADIOLOGY REPORT*  Clinical Data: Epigastric pain since September 27, diagnosed with gastroenteritis, with vomiting today, rule out small-bowel obstruction  CT ABDOMEN AND PELVIS WITHOUT CONTRAST  Technique:  Multidetector CT imaging of the abdomen and pelvis was performed following the standard protocol without intravenous contrast.  Comparison: Abdominal series performed earlier this evening  Findings: Gallbladder is surgically  absent.  Spleen is normal. Pancreas is normal.  Adrenal glands are normal.  Adjacent to the falciform ligament, there is a 3 cm zone of low attenuation in the hepatic parenchyma.  Left kidney is normal.  There is an extrarenal pelvis on the right, but the right kidney is otherwise normal.  Gastric wall is diffusely thickened.  There is a scattered wall thickening of moderate severity involving the jejunum.  The colon and demonstrates and moderately severe wall thickening diffusely, involving cecum and ascending colon as well as transverse colon and descending colon.  This process more mildly involve the sigmoid colon.  There is low attenuation in the wall of the more severely affected proximal large bowel consistent with fatty deposition suggesting that this may be a nonacute and progressive process.  Bladder is normal.  Reproductive organs are not well  characterized. There is a small volume of free fluid in the pelvis.  Uterus appears to be surgically absent.  Appendix is not identified.  The aorta is not dilated.  The visualized portions of the lung bases are clear.  Bone windows reveal no acute osseous abnormalities.  IMPRESSION: There is no dilatation or caliber transition to suggest obstruction.  There is moderately severe wall thickening of the cecum, ascending colon, transverse colon, and descending colon, with evidence of fatty deposition in the wall that can be seen with a more chronic inflammatory process.  There is left significant wall thickening of the sigmoid colon.  There is also multi focal scattered wall thickening of the jejunum. Finally, there is mild gastric wall thickening.  The findings can be seen with infectious gastroenteritis.  Inflammatory bowel disease is another consideration that should be considered.  Small volume free fluid in the pelvis within physiologic limits of normal.  Uterus appears to be absent.  Ovary is not specifically identified.   Original Report Authenticated By: Esperanza Heir, M.D.   Ct Head Wo Contrast  09/11/2013   CLINICAL DATA:  Headache.  EXAM: CT HEAD WITHOUT CONTRAST  TECHNIQUE: Contiguous axial images were obtained from the base of the skull through the vertex without intravenous contrast.  COMPARISON:  07/27/2013.  FINDINGS: The ventricles are normal in size and configuration. No extra-axial fluid collections are identified. The gray-white differentiation is normal. No CT findings for acute intracranial process such as hemorrhage or infarction. No mass lesions. The brainstem and cerebellum are grossly normal.  The bony structures are intact. The paranasal sinuses and mastoid air cells are clear. The globes are intact.  IMPRESSION: No acute intracranial findings or mass lesion.   Electronically Signed   By: Loralie Champagne M.D.   On: 09/11/2013 19:05   Dg Abd Acute W/chest  09/11/2013   *RADIOLOGY REPORT*  Clinical Data: Vomiting, nausea for 4 days  ACUTE ABDOMEN SERIES (ABDOMEN 2 VIEW & CHEST 1 VIEW)  Comparison: 07/27/2013  Findings: The heart size and vascular pattern are normal.  The lungs are clear.  There is no free air.  Clips in the right upper quadrant indicate prior cholecystectomy.  Primarily gasless abdomen with fecal retention noted. Convex right scoliotic curvature lumbar spine.  IMPRESSION:  1.  No acute cardiopulmonary process 2.  Nonspecific bowel gas pattern, primarily gasless.  Uncommonly, this can be associated with obstruction. Consider CT abdomen and pelvis to further evaluate if indicated.   Original Report Authenticated By: Esperanza Heir, M.D.    MDM   1. Gastroenteritis   2. Intractable vomiting    5 day history of nausea and vomiting. Discharged yesterday after hospitalization for gastroenteritis. Speech is not able to keep anything down at home. There is a gradual onset headache that did not come on during vomiting. He endorses epigastric pain.  Abdomen soft with epigastric tenderness. Neurological exam is nonfocal. Labs show  mild leukocytosis and hemoconcentration. Gas was bowel gas pattern on x-ray.  Mild epigastric tenderness without peritoneal signs. CT shows thickening of colon, small bowel and stomach. Likely due to gastroenteritis.  Patient continues to have pain and nausea in ED. Continues to dry heave despite multiple doses of medications. She feels she needs readmission to the hospital.  I personally performed the services described in this documentation, which was scribed in my presence. The recorded information has been reviewed and is accurate.   Melanie Octave, MD 09/11/13 [phone]

## 2013-09-11 NOTE — ED Notes (Signed)
Pt states still nauseated & wanting more medication, EDP notified. Pt has had no active vomiting & reaches for the emesis bag every time this nurse enters the room.
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
## 2013-09-11 NOTE — Telephone Encounter (Signed)
May use Zofran 8 mg 3 times a day when necessary if needing dissolvable tablet please give #21 no refills. As for the pain medicine husband to bring prescription here before 5 PM I could read and write it to be filled today. This could be part of her symptoms. If she gets worse she needs to go to either any plan or [facility]. Then she will need to followup with [place] within the next 7-10 days. As for pain medicine in the future it is important to stick with the number per day because federal government will not allow for medications to be refilled early on a regular basis.

## 2013-09-11 NOTE — ED Notes (Signed)
Pt sitting up in the bed for the first time, states both pain & nausea are better after the last medication.

## 2013-09-11 NOTE — Telephone Encounter (Signed)
Patient is having nausea and dizziness after being discharged from hospital. Please advise.  [facility]

## 2013-09-11 NOTE — Telephone Encounter (Signed)
Discussed.  rx returned and new rx wrote with a note to fill today. zofran sent to pharm. And discussed to return to ER if worse.

## 2013-09-11 NOTE — Telephone Encounter (Signed)
Husband called pt having dizziness, nausea and vomiting. Also she has been out of pain med since last night and not due to be filled til tomorrow. Could this be causing her symptoms.

## 2013-09-11 NOTE — ED Notes (Signed)
Headache with nausea and vomitng

## 2013-09-12 ENCOUNTER — Encounter (HOSPITAL_COMMUNITY): Payer: Self-pay | Admitting: Internal Medicine

## 2013-09-12 DIAGNOSIS — R112 Nausea with vomiting, unspecified: Secondary | ICD-10-CM

## 2013-09-12 DIAGNOSIS — R933 Abnormal findings on diagnostic imaging of other parts of digestive tract: Secondary | ICD-10-CM

## 2013-09-12 DIAGNOSIS — E039 Hypothyroidism, unspecified: Secondary | ICD-10-CM

## 2013-09-12 DIAGNOSIS — R1115 Cyclical vomiting syndrome unrelated to migraine: Secondary | ICD-10-CM

## 2013-09-12 LAB — CBC
Hemoglobin: 13 g/dL (ref 12.0–15.0)
MCH: 31.9 pg (ref 26.0–34.0)
RBC: 4.07 MIL/uL (ref 3.87–5.11)
RDW: 14.4 % (ref 11.5–15.5)
WBC: 11.2 10*3/uL — ABNORMAL HIGH (ref 4.0–10.5)

## 2013-09-12 LAB — COMPREHENSIVE METABOLIC PANEL
ALT: 20 U/L (ref 0–35)
Alkaline Phosphatase: 74 U/L (ref 39–117)
CO2: 21 mEq/L (ref 19–32)
Calcium: 8.7 mg/dL (ref 8.4–10.5)
Chloride: 107 mEq/L (ref 96–112)
GFR calc Af Amer: >90 mL/min (ref >90)
GFR calc non Af Amer: >90 mL/min (ref >90)
Glucose, Bld: 77 mg/dL (ref 70–99)
Potassium: 3.4 mEq/L — ABNORMAL LOW (ref 3.5–5.1)
Sodium: 140 mEq/L (ref 135–145)
Total Bilirubin: 0.5 mg/dL (ref 0.3–1.2)
Total Protein: 6.1 g/dL (ref 6.0–8.3)

## 2013-09-12 MED ORDER — BACLOFEN 10 MG PO TABS
10.0000 mg | ORAL_TABLET | Freq: Three times a day (TID) | ORAL | Status: DC
  Administered 2013-09-12 – 2013-09-15 (×10): 10 mg via ORAL
  Filled 2013-09-12 (×10): qty 1

## 2013-09-12 MED ORDER — OXYCODONE-ACETAMINOPHEN 10-325 MG PO TABS: 1.0000 | ORAL_TABLET | Freq: Four times a day (QID) | ORAL | Status: DC | PRN

## 2013-09-12 MED ORDER — FAMOTIDINE 20 MG PO TABS
20.0000 mg | ORAL_TABLET | Freq: Two times a day (BID) | ORAL | Status: DC
  Administered 2013-09-12 – 2013-09-15 (×7): 20 mg via ORAL
  Filled 2013-09-12 (×7): qty 1

## 2013-09-12 MED ORDER — OXYCODONE HCL 5 MG PO TABS
5.0000 mg | ORAL_TABLET | Freq: Four times a day (QID) | ORAL | Status: DC | PRN
  Administered 2013-09-12 – 2013-09-15 (×8): 5 mg via ORAL
  Filled 2013-09-12 (×10): qty 1

## 2013-09-12 MED ORDER — METOPROLOL SUCCINATE ER 25 MG PO TB24
25.0000 mg | ORAL_TABLET | Freq: Every day | ORAL | Status: DC
  Administered 2013-09-12 – 2013-09-15 (×3): 25 mg via ORAL
  Filled 2013-09-12 (×4): qty 1

## 2013-09-12 MED ORDER — CIPROFLOXACIN HCL 250 MG PO TABS
500.0000 mg | ORAL_TABLET | Freq: Two times a day (BID) | ORAL | Status: DC
  Administered 2013-09-12 (×2): 500 mg via ORAL
  Filled 2013-09-12 (×2): qty 2

## 2013-09-12 MED ORDER — CIPROFLOXACIN IN D5W 400 MG/200ML IV SOLN
400.0000 mg | Freq: Two times a day (BID) | INTRAVENOUS | Status: DC
  Administered 2013-09-12 – 2013-09-13 (×3): 400 mg via INTRAVENOUS
  Filled 2013-09-12 (×6): qty 200

## 2013-09-12 MED ORDER — HYDROMORPHONE HCL PF 1 MG/ML IJ SOLN
0.5000 mg | INTRAMUSCULAR | Status: DC | PRN
  Administered 2013-09-12 – 2013-09-14 (×17): 0.5 mg via INTRAVENOUS
  Filled 2013-09-12 (×18): qty 1

## 2013-09-12 MED ORDER — OXYCODONE-ACETAMINOPHEN 5-325 MG PO TABS
1.0000 | ORAL_TABLET | Freq: Four times a day (QID) | ORAL | Status: DC | PRN
  Administered 2013-09-12 – 2013-09-15 (×9): 1 via ORAL
  Filled 2013-09-12 (×10): qty 1

## 2013-09-12 MED ORDER — HEPARIN SODIUM (PORCINE) 5000 UNIT/ML IJ SOLN
5000.0000 [IU] | Freq: Three times a day (TID) | INTRAMUSCULAR | Status: DC
  Administered 2013-09-12 – 2013-09-15 (×10): 5000 [IU] via SUBCUTANEOUS
  Filled 2013-09-12 (×10): qty 1

## 2013-09-12 MED ORDER — ALPRAZOLAM 1 MG PO TABS
1.0000 mg | ORAL_TABLET | Freq: Two times a day (BID) | ORAL | Status: DC
  Administered 2013-09-12 – 2013-09-15 (×8): 1 mg via ORAL
  Filled 2013-09-12 (×8): qty 1

## 2013-09-12 MED ORDER — METRONIDAZOLE 500 MG PO TABS
500.0000 mg | ORAL_TABLET | Freq: Three times a day (TID) | ORAL | Status: DC
Start: 2013-09-12 — End: 2013-09-12
  Administered 2013-09-12 (×3): 500 mg via ORAL
  Filled 2013-09-12 (×3): qty 1

## 2013-09-12 MED ORDER — OXYCODONE HCL 5 MG PO TABS
5.0000 mg | ORAL_TABLET | ORAL | Status: DC | PRN
  Administered 2013-09-12 (×2): 5 mg via ORAL
  Filled 2013-09-12 (×2): qty 1

## 2013-09-12 MED ORDER — ACETAMINOPHEN 325 MG PO TABS: 650.0000 mg | ORAL_TABLET | ORAL | Status: DC | PRN

## 2013-09-12 MED ORDER — POTASSIUM CHLORIDE IN NACL 20-0.9 MEQ/L-% IV SOLN
INTRAVENOUS | Status: DC
  Administered 2013-09-12 – 2013-09-13 (×4): via INTRAVENOUS

## 2013-09-12 MED ORDER — METRONIDAZOLE IN NACL 5-0.79 MG/ML-% IV SOLN
500.0000 mg | Freq: Three times a day (TID) | INTRAVENOUS | Status: DC
  Administered 2013-09-12 – 2013-09-14 (×5): 500 mg via INTRAVENOUS
  Filled 2013-09-12 (×9): qty 100

## 2013-09-12 MED ORDER — LEVOTHYROXINE SODIUM 100 MCG PO TABS
125.0000 ug | ORAL_TABLET | Freq: Every day | ORAL | Status: DC
  Administered 2013-09-13 – 2013-09-15 (×3): 125 ug via ORAL
  Filled 2013-09-12 (×6): qty 1

## 2013-09-12 MED ORDER — ONDANSETRON HCL 4 MG/2ML IJ SOLN
4.0000 mg | INTRAMUSCULAR | Status: DC | PRN
  Administered 2013-09-12 – 2013-09-15 (×14): 4 mg via INTRAVENOUS
  Filled 2013-09-12 (×14): qty 2

## 2013-09-12 NOTE — Progress Notes (Signed)
[facility] PROGRESS NOTE  Melanie Shepard ZOX:[id number] DOB: Aug 18, 1966 DOA: 09/11/2013 PCP: Lilyan Punt, MD  Assessment/Plan: 1. Nausea, vomiting, suspected infectious gastroenteritis: Empiric antibiotics, stool studies though currently no diarrhea. Consider inflammatory disease. Further recommendations per GI. 2. Elevated TSH: Continue levothyroxine. Assess compliance. Consider increasing dose. 3. Chronic neck pain: Continue chronic pain medication.   Continue empiric antibiotics, antiemetics, pain medication as needed. Given nausea will change to IV.  Further recommendations per gastroenterology  Pending studies:   Stool studies if possible  Code Status: full code DVT prophylaxis: heparin Family Communication: none present Disposition Plan: home when improved  Brendia Sacks, MD  [facility]  Pager [phone] If 7PM-7AM, please contact night-coverage at [email or web address], password [email or web address] 09/12/2013, 3:43 PM  LOS: 1 day   Summary: 47 year old woman recently admitted for nausea and vomiting which quickly resolved who presented 48 hours later with recurrent nausea and vomiting. CT suggested acute gastroenteritis.  Consultants:  Gastroenterology Procedures:    Antibiotics:  10/2 ciprofloxacin >>   10/2 Flagyl >>   HPI/Subjective: Still nauseous and vomiting has decreased. Tolerating some clear liquids. Intermittent crampy abdominal pain. No diarrhea.  Objective: Filed Vitals:   09/12/13 0541 09/12/13 0543 09/12/13 1010 09/12/13 1438  BP: 104/62 107/74 108/71 117/74  Pulse: 75 94 69 83  Temp:   98.6 F (37 C) 97.3 F (36.3 C)  TempSrc:   Oral Oral  Resp:   18 20  Height:      Weight:      SpO2: 100% 100% 99% 99%    Intake/Output Summary (Last 24 hours) at 09/12/13 1543 Last data filed at 09/12/13 1510  Gross per 24 hour  Intake   1572 ml  Output    400 ml  Net   1172 ml     Filed Weights   09/11/13 1725 09/11/13 2317 09/12/13 0500  Weight:  61.236 kg (135 lb) 66.6 kg (146 lb 13.2 oz) 67.4 kg (148 lb 9.4 oz)    Exam:   Afebrile, vital signs stable.  Cardiovascular: Regular rate and rhythm. No murmur, rub, gallop.  Respiratory: Clear to auscultation bilaterally. No wheezes, rales, rhonchi. Normal respiratory effort.  General: Appears calm and mildly uncomfortable. Speech fluent and clear.  Data Reviewed:  Potassium 3.4.  WBC 11.2.  Urine pregnancy negative  TSH 32.971  CT head negative  CT ab/pelvis noted  Scheduled Meds: . ALPRAZolam  1 mg Oral BID  . baclofen  10 mg Oral TID  . ciprofloxacin  500 mg Oral BID  . famotidine  20 mg Oral BID  . heparin  5,000 Units Subcutaneous Q8H  . metoprolol succinate  25 mg Oral Daily  . metroNIDAZOLE  500 mg Oral Q8H   Continuous Infusions: . 0.9 % NaCl with KCl 20 mEq / L 150 mL/hr at 09/12/13 1434    Active Problems:   Generalized anxiety disorder   Unspecified hypothyroidism   Active smoker   Chronic pain disorder   Gastroenteritis   Time spent 15 minutes

## 2013-09-12 NOTE — Consult Note (Signed)
Referring Provider: Standley Brooking, MD Primary Care Physician:  Lilyan Punt, MD Primary Gastroenterologist:  Roetta Sessions, MD  Reason for Consultation:  N/V, abnormal CT  HPI: Melanie Shepard is a 47 y.o. female admitted with recurrent N/V yesterday.  D/C on 09/10/13 after 24 hour admission for presumed viral gastroenteritis. Seen at the [facility] on 09/08/2013 with 3 day h/o N/V, had negative CT abdomen as well as x-ray. Offered admission for nausea, vomiting but patient declined at that time.  Essentially at this point has had six days of N/V. One episode of diarrhea last admission, otherwise no BM. No fever. No ill contacts. No new medications. No heartburn. Some crampy abdominal pain in center of stomach. No prior TCS. No NSAIDs other than ASA 81mg  daily. No melena, brbpr, hematemeis. No heartburn.  CT A/P without contrast yesterday showed "moderately severe wall thickening of the cecum, ascending colon, transverse colon, and descending colon, with evidence of fatty deposition in the wall that can be seen with a more chronic inflammatory process. There is left significant wall thickening of the sigmoid colon. There is also multi focal scattered wall thickening of the jejunum. Finally, there is mild gastric wall thickening. The findings can be seen with infectious gastroenteritis."   Starting to feel hungry again. Tolerating oral Flagyl/Cipro.  Prior to Admission medications   Medication Sig Start Date End Date Taking? Authorizing Provider  ALPRAZolam Prudy Feeler) 1 MG tablet Take 1 tablet (1 mg total) by mouth 2 (two) times daily. 08/14/13  Yes Babs Sciara, MD  aspirin 81 MG tablet Take 1 tablet (81 mg total) by mouth daily. 07/29/13  Yes Shanker Levora Dredge, MD  baclofen (LIORESAL) 10 MG tablet Take 10 mg by mouth 3 (three) times daily.   Yes Historical Provider, MD  Cholecalciferol (VITAMIN D3) 5000 UNITS CAPS Take 1 tablet by mouth daily.   Yes Historical Provider, MD   levothyroxine (SYNTHROID, LEVOTHROID) 125 MCG tablet Take 1 tablet (125 mcg total) by mouth daily before breakfast. 08/22/13  Yes Babs Sciara, MD  metoprolol succinate (TOPROL-XL) 25 MG 24 hr tablet Take 25 mg by mouth daily.   Yes Historical Provider, MD  ondansetron (ZOFRAN ODT) 4 MG disintegrating tablet Take 1 tablet (4 mg total) by mouth every 8 (eight) hours as needed for nausea. 08/15/13  Yes Merlyn Albert, MD  ondansetron (ZOFRAN-ODT) 8 MG disintegrating tablet Take 1 tablet (8 mg total) by mouth every 8 (eight) hours as needed for nausea. 09/11/13  Yes Babs Sciara, MD  oxyCODONE-acetaminophen (PERCOCET) 10-325 MG per tablet Take 1 tablet by mouth every 6 (six) hours as needed for pain. 09/11/13 09/11/14 Yes Babs Sciara, MD    Current Facility-Administered Medications  Medication Dose Route Frequency Provider Last Rate Last Dose  . 0.9 % NaCl with KCl 20 mEq/ L  infusion   Intravenous Continuous Vania Rea, MD 150 mL/hr at 09/12/13 1434    . acetaminophen (TYLENOL) tablet 650 mg  650 mg Oral Q4H PRN Vania Rea, MD      . ALPRAZolam Prudy Feeler) tablet 1 mg  1 mg Oral BID Vania Rea, MD   1 mg at 09/12/13 1049  . baclofen (LIORESAL) tablet 10 mg  10 mg Oral TID Vania Rea, MD   10 mg at 09/12/13 1050  . ciprofloxacin (CIPRO) tablet 500 mg  500 mg Oral BID Vania Rea, MD   500 mg at 09/12/13 0905  . famotidine (PEPCID) tablet 20 mg  20 mg Oral BID  Vania Rea, MD   20 mg at 09/12/13 1054  . heparin injection 5,000 Units  5,000 Units Subcutaneous Q8H Vania Rea, MD   5,000 Units at 09/12/13 1356  . HYDROmorphone (DILAUDID) injection 0.5 mg  0.5 mg Intravenous Q2H PRN Vania Rea, MD   0.5 mg at 09/12/13 1343  . metoprolol succinate (TOPROL-XL) 24 hr tablet 25 mg  25 mg Oral Daily Vania Rea, MD   25 mg at 09/12/13 1049  . metroNIDAZOLE (FLAGYL) tablet 500 mg  500 mg Oral Q8H Vania Rea, MD   500 mg at 09/12/13 1357  . ondansetron  (ZOFRAN) injection 4 mg  4 mg Intravenous Q4H PRN Vania Rea, MD   4 mg at 09/12/13 1344  . oxyCODONE (Oxy IR/ROXICODONE) immediate release tablet 5 mg  5 mg Oral Q4H PRN Vania Rea, MD   5 mg at 09/12/13 0914    Allergies as of 09/11/2013 - Review Complete 09/11/2013  Allergen Reaction Noted  . Adhesive [tape]  07/08/2013  . Cymbalta [duloxetine hcl] Other (See Comments) 02/22/2013  . Ivp dye [iodinated diagnostic agents]  07/27/2013  . Other  07/08/2013    Past Medical History  Diagnosis Date  . Post concussion syndrome 1999  . 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 Cervical pain (neck)     chronic  . Hypothyroidism   . Anxiety   . Arthritis     right hip    Past Surgical History  Procedure Laterality Date  . Cervix surgery  10/11  . Cardiovascular stress test  03/12    normal  . Tubal ligation Bilateral   . Knee surgery Left 12/89  . Back surgery      cervical fusion  . Abdominal hysterectomy    . Cardiac catheterization  2014    mild CAD, no significant stenosis    Family History  Problem Relation Age of Onset  . Coronary artery disease Mother 70    MI  . Colon cancer Mother     33s    History   Social History  . Marital Status: Married    Spouse Name: N/A    Number of Children: 3  . Years of Education: N/A   Occupational History  .  [employer]   Social History Main Topics  . Smoking status: Current Every Day Smoker -- 1.00 packs/day for 30 years    Types: Cigarettes  . Smokeless tobacco: Not on file  . Alcohol Use: No  . Drug Use: No  . Sexual Activity: Yes    Birth Control/ Protection: Surgical   Other Topics Concern  . Not on file   Social History Narrative  . No narrative on file     ROS:  General: Negative for  fever, chills, fatigue, weakness.see hpi Eyes: Negative for vision changes.  ENT: Negative for hoarseness, difficulty swallowing , nasal congestion. CV: Negative for chest pain, angina, palpitations,  dyspnea on exertion, peripheral edema.  Respiratory: Negative for dyspnea at rest, dyspnea on exertion, cough, sputum, wheezing.  GI: See history of present illness. GU:  Negative for dysuria, hematuria, urinary incontinence, urinary frequency, nocturnal urination.  MS: chronic neck pain. Negative for low back pain.  Derm: Negative for rash or itching.  Neuro: Negative for weakness, abnormal sensation, seizure, frequent headaches, memory loss, confusion.  Psych: Negative for anxiety, depression, suicidal ideation, hallucinations.  Endo: Negative for unusual weight change.  Heme: Negative for bruising or bleeding. Allergy: Negative for rash or hives.  Physical Examination: Vital signs in last 24 hours: Temp:  [97.6 F (36.4 C)-98.7 F (37.1 C)] 98.6 F (37 C) (10/03 1010) Pulse Rate:  [62-94] 69 (10/03 1010) Resp:  [18] 18 (10/03 1010) BP: (81-124)/(38-76) 108/71 mmHg (10/03 1010) SpO2:  [97 %-100 %] 99 % (10/03 1010) Weight:  [135 lb (61.236 kg)-148 lb 9.4 oz (67.4 kg)] 148 lb 9.4 oz (67.4 kg) (10/03 0500) Last BM Date: 09/11/13  General: Well-nourished, well-developed in no acute distress. Accompanied by husband. Head: Normocephalic, atraumatic.   Eyes: Conjunctiva pink, no icterus. Mouth: Oropharyngeal mucosa moist and pink , no lesions erythema or exudate. Neck: Supple without thyromegaly, masses, or lymphadenopathy.  Lungs: Clear to auscultation bilaterally.  Heart: Regular rate and rhythm, no murmurs rubs or gallops.  Abdomen: Bowel sounds are normal, diffuse mild tenderness, nondistended, no hepatosplenomegaly or masses, no abdominal bruits or hernia , no rebound or guarding.   Rectal: not performed Extremities: No lower extremity edema, clubbing, deformity.  Neuro: Alert and oriented x 4 , grossly normal neurologically.  Skin: Warm and dry, no rash or jaundice.   Psych: Alert and cooperative, normal mood and affect.        Intake/Output from previous day: 10/02  0701 - 10/03 0700 In: 1232 [P.O.:232; I.V.:1000] Out: 400 [Urine:400] Intake/Output this shift: Total I/O In: 100 [P.O.:100] Out: -   Lab Results: CBC  Recent Labs  09/10/13 0608 09/11/13 1812 09/12/13 0514  WBC 11.3* 12.1* 11.2*  HGB 12.8 16.0* 13.0  HCT 37.9 46.2* 37.9  MCV 93.1 92.8 93.1  PLT 217 203 174   BMET  Recent Labs  09/10/13 0608 09/11/13 1812 09/12/13 0514  NA 141 140 140  K 3.2* 3.8 3.4*  CL 109 102 107  CO2 25 22 21   GLUCOSE 83 92 77  BUN 12 6 4*  CREATININE 0.80 0.72 0.73  CALCIUM 8.7 9.8 8.7   LFT  Recent Labs  09/10/13 0608 09/11/13 1812 09/12/13 0514  BILITOT 0.9 0.6 0.5  ALKPHOS 73 103 74  AST 33 20 14  ALT 32 34 20  PROT 6.0 8.3 6.1  ALBUMIN 3.4* 4.8 3.5   Lab Results  Component Value Date   LIPASE 40 09/11/2013   Lab Results  Component Value Date   TSH 11.247* 08/19/2013    PT/INR No results found for this basename: LABPROT, INR,  in the last 72 hours    Imaging Studies: Ct Abdomen Pelvis Wo Contrast  09/11/2013   *RADIOLOGY REPORT*  Clinical Data: Epigastric pain since September 27, diagnosed with gastroenteritis, with vomiting today, rule out small-bowel obstruction  CT ABDOMEN AND PELVIS WITHOUT CONTRAST  Technique:  Multidetector CT imaging of the abdomen and pelvis was performed following the standard protocol without intravenous contrast.  Comparison: Abdominal series performed earlier this evening  Findings: Gallbladder is surgically absent.  Spleen is normal. Pancreas is normal.  Adrenal glands are normal.  Adjacent to the falciform ligament, there is a 3 cm zone of low attenuation in the hepatic parenchyma.  Left kidney is normal.  There is an extrarenal pelvis on the right, but the right kidney is otherwise normal.  Gastric wall is diffusely thickened.  There is a scattered wall thickening of moderate severity involving the jejunum.  The colon and demonstrates and moderately severe wall thickening diffusely, involving  cecum and ascending colon as well as transverse colon and descending colon.  This process more mildly involve the sigmoid colon.  There is low attenuation in the wall of  the more severely affected proximal large bowel consistent with fatty deposition suggesting that this may be a nonacute and progressive process.  Bladder is normal.  Reproductive organs are not well characterized. There is a small volume of free fluid in the pelvis.  Uterus appears to be surgically absent.  Appendix is not identified.  The aorta is not dilated.  The visualized portions of the lung bases are clear.  Bone windows reveal no acute osseous abnormalities.  IMPRESSION: There is no dilatation or caliber transition to suggest obstruction.  There is moderately severe wall thickening of the cecum, ascending colon, transverse colon, and descending colon, with evidence of fatty deposition in the wall that can be seen with a more chronic inflammatory process.  There is left significant wall thickening of the sigmoid colon.  There is also multi focal scattered wall thickening of the jejunum. Finally, there is mild gastric wall thickening.  The findings can be seen with infectious gastroenteritis.  Inflammatory bowel disease is another consideration that should be considered.  Small volume free fluid in the pelvis within physiologic limits of normal.  Uterus appears to be absent.  Ovary is not specifically identified.   Original Report Authenticated By: Esperanza Heir, M.D.   Ct Head Wo Contrast  09/11/2013   CLINICAL DATA:  Headache.  EXAM: CT HEAD WITHOUT CONTRAST  TECHNIQUE: Contiguous axial images were obtained from the base of the skull through the vertex without intravenous contrast.  COMPARISON:  07/27/2013.  FINDINGS: The ventricles are normal in size and configuration. No extra-axial fluid collections are identified. The gray-white differentiation is normal. No CT findings for acute intracranial process such as hemorrhage or infarction.  No mass lesions. The brainstem and cerebellum are grossly normal.  The bony structures are intact. The paranasal sinuses and mastoid air cells are clear. The globes are intact.  IMPRESSION: No acute intracranial findings or mass lesion.   Electronically Signed   By: Loralie Champagne M.D.   On: 09/11/2013 19:05   Dg Abd Acute W/chest  09/11/2013   *RADIOLOGY REPORT*  Clinical Data: Vomiting, nausea for 4 days  ACUTE ABDOMEN SERIES (ABDOMEN 2 VIEW & CHEST 1 VIEW)  Comparison: 07/27/2013  Findings: The heart size and vascular pattern are normal.  The lungs are clear.  There is no free air.  Clips in the right upper quadrant indicate prior cholecystectomy.  Primarily gasless abdomen with fecal retention noted. Convex right scoliotic curvature lumbar spine.  IMPRESSION:  1.  No acute cardiopulmonary process 2.  Nonspecific bowel gas pattern, primarily gasless.  Uncommonly, this can be associated with obstruction. Consider CT abdomen and pelvis to further evaluate if indicated.   Original Report Authenticated By: Esperanza Heir, M.D.  Pierre.Alas week]   Impression: 47 y/o female with intractable N/V for six days. CT with diffuse wall thickening  involving stomach, jejunum, colon. This findings apparently were not there on CT six days ago as it was reportedly normal. Suspect infectious process, with low likelihood of IBD. Clinically she is feeling better. She has not had any BMs.   Plan: 1. Treat with Cipro/Flagyl as planned. Complete 7-10 day course. 2. Consider outpatient colonoscopy in a couple of months to f/u on abnormal CT findings and given FH of CRC, mother in her 28s.  3. F/U stool studies if become available.   I would like to thank Dr. Irene Limbo for allowing [place] to take part in the care of this nice patient.    LOS: 1 day   Verlon Au  Lewis  09/12/2013, 2:53 PM  Attending note: Patient seen and examined. CT reviewed. Most likely this lady has an infectious gastroenteritis. She does not appear toxic. Agree  with impression and plan as outlined above.  Dr. Karilyn Cota will see as needed over the weekend

## 2013-09-12 NOTE — Progress Notes (Signed)
UR chart review completed.  

## 2013-09-12 NOTE — Care Management Note (Signed)
    Page 1 of 1   09/12/2013     12:08:18 PM   CARE MANAGEMENT NOTE 09/12/2013  Patient:  Melanie Shepard, Melanie Shepard   Account Number:  [id number]  Date Initiated:  09/12/2013  Documentation initiated by:  Sharrie Rothman  Subjective/Objective Assessment:   Pt admitted from home with gastroenteritis. Pt lives at home with her husband and will return home at discharge. Pt is independent with ADL's.     Action/Plan:   No CM needs noted.   Anticipated DC Date:  09/15/2013   Anticipated DC Plan:  HOME/SELF CARE      DC Planning Services  CM consult      Choice offered to / List presented to:             Status of service:  Completed, signed off Medicare Important Message given?   (If response is "NO", the following Medicare IM given date fields will be blank) Date Medicare IM given:   Date Additional Medicare IM given:    Discharge Disposition:  HOME/SELF CARE  Per UR Regulation:    If discussed at Long Length of Stay Meetings, dates discussed:    Comments:  09/12/13 1205 Arlyss Queen, RN BSN CM

## 2013-09-13 DIAGNOSIS — R1013 Epigastric pain: Secondary | ICD-10-CM

## 2013-09-13 DIAGNOSIS — G320 Subacute combined degeneration of spinal cord in diseases classified elsewhere: Secondary | ICD-10-CM

## 2013-09-13 MED ORDER — HYOSCYAMINE SULFATE 0.125 MG SL SUBL
0.1250 mg | SUBLINGUAL_TABLET | Freq: Three times a day (TID) | SUBLINGUAL | Status: DC
  Administered 2013-09-13 – 2013-09-15 (×6): 0.125 mg via SUBLINGUAL
  Filled 2013-09-13 (×6): qty 1

## 2013-09-13 NOTE — Progress Notes (Signed)
[facility] PROGRESS NOTE  Melanie Shepard WUJ:[id number] DOB: [birth year] DOA: 09/11/2013 PCP: Lilyan Punt, MD  Assessment/Plan: 1. Nausea, vomiting, suspected infectious gastroenteritis: Continue empiric antibiotics, stool studies though currently no diarrhea. Inflammatory disease thought unlikely per gastroenterology. Further recommendations per GI. 2. Elevated TSH: Continue levothyroxine. Assess compliance. Consider increasing dose. 3. Chronic neck pain: Continue chronic pain medication.   Continue empiric antibiotics, antiemetics, pain medication as needed.   Advance diet  Check basic metabolic panel, CBC in the morning  Further recommendations per gastroenterology  Pending studies:   Stool studies if possible  Code Status: full code DVT prophylaxis: heparin Family Communication: none present Disposition Plan: home when improved  Brendia Sacks, MD  [facility]  Pager [phone] If 7PM-7AM, please contact night-coverage at [email or web address], password [facility] 09/13/2013, 12:23 PM  LOS: 2 days   Summary: 47 year old woman recently admitted for nausea and vomiting which quickly resolved who presented 48 hours later with recurrent nausea and vomiting. CT suggested acute gastroenteritis.  Consultants:  Gastroenterology Procedures:    Antibiotics:  10/2 ciprofloxacin >>   10/2 Flagyl >>   HPI/Subjective: Still some nausea but vomiting has resolved. Abdominal pain about the same. Very hungry, would like to advance diet.  Objective: Filed Vitals:   09/12/13 2015 09/13/13 0220 09/13/13 0442 09/13/13 0854  BP: 109/71 106/69 93/58 94/67   Pulse: 61 66 68   Temp: 97.9 F (36.6 C) 98.5 F (36.9 C) 99 F (37.2 C)   TempSrc: Oral Oral Oral   Resp: 16 16 16    Height:      Weight:      SpO2: 100% 96% 95%     Intake/Output Summary (Last 24 hours) at 09/13/13 1223 Last data filed at 09/13/13 0959  Gross per 24 hour  Intake   1440 ml  Output   1350 ml  Net      90 ml     Filed Weights   09/11/13 1725 09/11/13 2317 09/12/13 0500  Weight: 61.236 kg (135 lb) 66.6 kg (146 lb 13.2 oz) 67.4 kg (148 lb 9.4 oz)    Exam:   Afebrile, vital signs stable. Observed ambulating the hallway without difficulty.  Cardiovascular: Regular rate and rhythm. No murmur, rub, gallop.  Respiratory: Clear to auscultation bilaterally. No wheezes, rales, rhonchi. Normal respiratory effort.  Abdomen: Soft, nondistended, mild tenderness.  General: Appears calm and mildly uncomfortable.  Data Reviewed:  No new data  Scheduled Meds: . ALPRAZolam  1 mg Oral BID  . baclofen  10 mg Oral TID  . ciprofloxacin  400 mg Intravenous Q12H  . famotidine  20 mg Oral BID  . heparin  5,000 Units Subcutaneous Q8H  . hyoscyamine  0.125 mg Sublingual TID AC  . levothyroxine  125 mcg Oral QAC breakfast  . metoprolol succinate  25 mg Oral Daily  . metronidazole  500 mg Intravenous Q8H   Continuous Infusions: . 0.9 % NaCl with KCl 20 mEq / L 150 mL/hr at 09/13/13 0408    Active Problems:   Generalized anxiety disorder   Unspecified hypothyroidism   Active smoker   Chronic pain disorder   Gastroenteritis   Time spent 15 minutes

## 2013-09-13 NOTE — Progress Notes (Signed)
Subjective; Patient states she had a bad night she feels better this morning. She had epigastric pain and nausea but no vomiting. She has not had any bowel movements since admission. She actually feels hungry this morning. She is keeping liquids down  . Objective; BP 94/67  Pulse 68  Temp(Src) 99 F (37.2 C) (Oral)  Resp 16  Ht 5\' 7"  (1.702 m)  Wt 148 lb 9.4 oz (67.4 kg)  BMI 23.27 kg/m2  SpO2 95% Abdomen is symmetrical. Bowel sounds are normal. Abdomen is soft with mild midepigastric tenderness. No organomegaly or masses.  Lab data; Stool studies pending(stool sample yet to be obtained). Abdominal pelvic CT from September 21, 2013 reviewed. Very pronounced wall thickening to ascending colon and transverse colon and less so to descending colon and segments of jejunum.  Assessment; Acute enterocolitis most likely secondary to infection. She is slowly improving. It is very important thrust to get stool sample for GI pathogen panel and hopefully we can make accurate diagnosis. In the meantime we'll continue with IV Cipro and metronidazole today.  Recommendations; Full liquid diet . If she tolerates full liquid diet Will switch to oral antibiotics in a.m. Levsin SL ac.

## 2013-09-14 DIAGNOSIS — M542 Cervicalgia: Secondary | ICD-10-CM

## 2013-09-14 DIAGNOSIS — G8929 Other chronic pain: Secondary | ICD-10-CM

## 2013-09-14 DIAGNOSIS — K5289 Other specified noninfective gastroenteritis and colitis: Secondary | ICD-10-CM

## 2013-09-14 LAB — BASIC METABOLIC PANEL
BUN: 3 mg/dL — ABNORMAL LOW (ref 6–23)
CO2: 25 mEq/L (ref 19–32)
Calcium: 9 mg/dL (ref 8.4–10.5)
Chloride: 107 mEq/L (ref 96–112)
Creatinine, Ser: 0.87 mg/dL (ref 0.50–1.10)
GFR calc Af Amer: >90 mL/min (ref >90)
Glucose, Bld: 93 mg/dL (ref 70–99)

## 2013-09-14 LAB — CBC
HCT: 37.1 % (ref 36.0–46.0)
MCHC: 34.2 g/dL (ref 30.0–36.0)
MCV: 93.5 fL (ref 78.0–100.0)
RDW: 14.7 % (ref 11.5–15.5)
WBC: 9.4 10*3/uL (ref 4.0–10.5)

## 2013-09-14 MED ORDER — CIPROFLOXACIN HCL 250 MG PO TABS
500.0000 mg | ORAL_TABLET | Freq: Two times a day (BID) | ORAL | Status: DC
  Administered 2013-09-14 – 2013-09-15 (×3): 500 mg via ORAL
  Filled 2013-09-14 (×3): qty 2

## 2013-09-14 MED ORDER — POTASSIUM CHLORIDE CRYS ER 20 MEQ PO TBCR
40.0000 meq | EXTENDED_RELEASE_TABLET | Freq: Once | ORAL | Status: AC
  Administered 2013-09-14: 40 meq via ORAL
  Filled 2013-09-14: qty 2

## 2013-09-14 MED ORDER — HYDROMORPHONE HCL PF 1 MG/ML IJ SOLN
0.5000 mg | INTRAMUSCULAR | Status: DC | PRN
  Administered 2013-09-14 – 2013-09-15 (×5): 0.5 mg via INTRAVENOUS
  Filled 2013-09-14 (×5): qty 1

## 2013-09-14 MED ORDER — METRONIDAZOLE 500 MG PO TABS
500.0000 mg | ORAL_TABLET | Freq: Four times a day (QID) | ORAL | Status: DC
  Administered 2013-09-14 – 2013-09-15 (×4): 500 mg via ORAL
  Filled 2013-09-14 (×4): qty 1

## 2013-09-14 NOTE — Progress Notes (Signed)
Subjective; Patient reports decrease in severity of epigastric pain. She had no difficulty with full liquids yesterday. She still has not had a bowel movement.  Objective; BP 122/74  Pulse 61  Temp(Src) 98 F (36.7 C) (Oral)  Resp 20  Ht 5\' 7"  (1.702 m)  Wt 148 lb 9.4 oz (67.4 kg)  BMI 23.27 kg/m2  SpO2 99% Patient appears comfortable. Abdomen is full with hyperactive bowel sounds. On palpation it is soft with mild generalized tenderness which is more pronounced in the epigastric region but no guarding or organomegaly noted.  Lab data; WBC 9.4, H&H 12.7 and 37.1 and platelet count 212K Serum sodium 142, potassium 3.4, right 107, CO2 25, glucose 93, BUN 3 and creatinine 0.87.  Assessment; Acute enterocolitis felt to be an infection. Inflammatory changes are more pronounced in proximal colon. Significant symptomatic improvement in last 24 hours. Leukocytosis has resolved. Mild hypokalemia should improve now that her diet has been advanced and she is not having diarrhea. Agree with plans to advance diet and transitioned to oral antibiotics. Unless symptoms progress she should be able to go home tomorrow and hopefully we can obtain stool specimen for GI pathogen panel before discharge. Dr. Jena Gauss is planning a colonoscopy later this year because of family history of colon carcinoma in her mother.

## 2013-09-14 NOTE — Plan of Care (Signed)
Scanner in room is broken. IT tkt put in 10/4 and 10/5.  Also unable to portable cow with working scanner.  Will just verify name and DOB verbally and on armband for remainder of shift - till scanner is fixed.

## 2013-09-14 NOTE — Progress Notes (Signed)
[facility] PROGRESS NOTE  Melanie Shepard ZOX:[id number] DOB: August 17, 1966 DOA: 09/11/2013 PCP: Lilyan Punt, MD  Assessment/Plan: 1. Nausea, vomiting, suspected infectious gastroenteritis: Improving. Continue empiric antibiotics. Inflammatory disease thought unlikely per gastroenterology. Further recommendations per GI. 2. Elevated TSH: Continue levothyroxine. Assess compliance. Consider increasing dose and followup as an outpatient. 3. Chronic neck pain: Continue chronic pain medication. 4. Cigarette smoker: Recommend cessation.  Overall improving. Anticipate discharge next 24 hours.   Advance diet  Change to oral antibiotics  Likely home 10/6  Pending studies:   Stool studies if possible  Code Status: full code DVT prophylaxis: heparin Family Communication: none present Disposition Plan: home when improved  Brendia Sacks, MD  [facility]  Pager [phone] If 7PM-7AM, please contact night-coverage at [email or web address], password [facility] 09/14/2013, 9:41 AM  LOS: 3 days   Summary: 47 year old woman recently admitted for nausea and vomiting which quickly resolved who presented 48 hours later with recurrent nausea and vomiting. CT suggested acute gastroenteritis.  Consultants:  Gastroenterology Procedures:    Antibiotics:  10/2 ciprofloxacin >>  10/2 Flagyl >>   HPI/Subjective: Overall feeling better. Less pain. Tolerating full liquids, wants to advance to solid food. Minimal nausea. No vomiting. No bowel movement.  Objective: Filed Vitals:   09/13/13 2118 09/13/13 2147 09/14/13 0128 09/14/13 0629  BP:  100/63 108/67 120/65  Pulse: 60 105 68 61  Temp:   98.6 F (37 C) 98 F (36.7 C)  TempSrc:   Oral Oral  Resp: 16 18 18 20   Height:      Weight:      SpO2: 95% 98% 96% 99%    Intake/Output Summary (Last 24 hours) at 09/14/13 0941 Last data filed at 09/14/13 8295  Gross per 24 hour  Intake   2820 ml  Output      0 ml  Net   2820 ml     Filed  Weights   09/11/13 1725 09/11/13 2317 09/12/13 0500  Weight: 61.236 kg (135 lb) 66.6 kg (146 lb 13.2 oz) 67.4 kg (148 lb 9.4 oz)    Exam:   Afebrile, vital signs stable.   General: Appears calm and comfortable.  Cardiovascular: Regular rate and rhythm. No murmur, rub, gallop.  Respiratory: Clear to auscultation bilaterally. No wheezes, rales, rhonchi. Normal respiratory effort.  Abdomen: Soft, nondistended, significant tenderness.  Data Reviewed:  Potassium 3.4.  CBC normal. Leukocytosis has resolved.  Scheduled Meds: . ALPRAZolam  1 mg Oral BID  . baclofen  10 mg Oral TID  . ciprofloxacin  400 mg Intravenous Q12H  . famotidine  20 mg Oral BID  . heparin  5,000 Units Subcutaneous Q8H  . hyoscyamine  0.125 mg Sublingual TID AC  . levothyroxine  125 mcg Oral QAC breakfast  . metoprolol succinate  25 mg Oral Daily  . metronidazole  500 mg Intravenous Q8H   Continuous Infusions:    Active Problems:   Generalized anxiety disorder   Unspecified hypothyroidism   Active smoker   Chronic pain disorder   Gastroenteritis   Time spent 15 minutes

## 2013-09-15 ENCOUNTER — Telehealth: Payer: Self-pay | Admitting: Gastroenterology

## 2013-09-15 ENCOUNTER — Encounter: Payer: Self-pay | Admitting: Gastroenterology

## 2013-09-15 DIAGNOSIS — K5289 Other specified noninfective gastroenteritis and colitis: Secondary | ICD-10-CM

## 2013-09-15 MED ORDER — METRONIDAZOLE 500 MG PO TABS: 500.0000 mg | ORAL_TABLET | Freq: Four times a day (QID) | ORAL | Status: DC

## 2013-09-15 MED ORDER — CIPROFLOXACIN HCL 500 MG PO TABS: 500.0000 mg | ORAL_TABLET | Freq: Two times a day (BID) | ORAL | Status: DC

## 2013-09-15 MED ORDER — ONDANSETRON 4 MG PO TBDP: 4.0000 mg | ORAL_TABLET | Freq: Three times a day (TID) | ORAL | Status: DC | PRN

## 2013-09-15 NOTE — Telephone Encounter (Signed)
Patient likely to be discharged today, Oct 6th.  Please arrange follow-up in the next 4-6 weeks to discuss outpatient colonoscopy.

## 2013-09-15 NOTE — Telephone Encounter (Signed)
Pt is aware of OV on 11/3 at 11 with AS and appt card was mailed

## 2013-09-15 NOTE — Discharge Summary (Signed)
Physician Discharge Summary  Melanie Shepard ZOX:[id number] DOB: 01/11/1966 DOA: 09/11/2013  PCP: Lilyan Punt, MD  Admit date: 09/11/2013 Discharge date: 09/15/2013  Recommendations for Outpatient Follow-up:  1. Followup resolution of infectious gastroenteritis 2. Colonoscopy per GI as an outpatient   Discharge Diagnoses:  1. Nausea, vomiting, infectious gastroenteritis  Discharge Condition: Improved Disposition: Home  Diet recommendation: Bland diet  Filed Weights   09/11/13 2317 09/12/13 0500 09/15/13 0526  Weight: 66.6 kg (146 lb 13.2 oz) 67.4 kg (148 lb 9.4 oz) 67.45 kg (148 lb 11.2 oz)    History of present illness:  47 year old woman recently admitted for nausea and vomiting which quickly resolved who presented 48 hours later with recurrent nausea and vomiting. CT suggested acute gastroenteritis.  Hospital Course:  Ms. Fujii is admitted for nausea, vomiting abdominal pain. CT of the abdomen and pelvis demonstrated inflammation. The patient was seen by gastroenterology. Presumed etiology infectious gastroenteritis. No signs or symptoms to suggest inflammatory bowel disease. She improved on antibiotics, is tolerating a diet and oral antibiotics with minimal pain and is stable for discharge. She will follow up with GI as an outpatient for colonoscopy.  Consultants:  Gastroenterology Procedures:  Antibiotics:  10/2 ciprofloxacin >> 10/11  10/2 Flagyl >> 10/11 Discharge Instructions  Discharge Orders   Future Appointments Provider Department Dept Phone   09/16/2013 2:30 PM Babs Sciara, MD [facility] [phone]   Future Orders Complete By Expires   Activity as tolerated - No restrictions  As directed    Discharge instructions  As directed    Comments:     Be sure to finish antibiotics. Clear physician or seek immediate medical attention for fever, recurrent vomiting, abdominal pain or worsening of condition. Continue bland diet until completely better.        Medication List         ALPRAZolam 1 MG tablet  Commonly known as:  XANAX  Take 1 tablet (1 mg total) by mouth 2 (two) times daily.     aspirin 81 MG tablet  Take 1 tablet (81 mg total) by mouth daily.     baclofen 10 MG tablet  Commonly known as:  LIORESAL  Take 10 mg by mouth 3 (three) times daily.     ciprofloxacin 500 MG tablet  Commonly known as:  CIPRO  Take 1 tablet (500 mg total) by mouth 2 (two) times daily.     levothyroxine 125 MCG tablet  Commonly known as:  SYNTHROID, LEVOTHROID  Take 1 tablet (125 mcg total) by mouth daily before breakfast.     metoprolol succinate 25 MG 24 hr tablet  Commonly known as:  TOPROL-XL  Take 25 mg by mouth daily.     metroNIDAZOLE 500 MG tablet  Commonly known as:  FLAGYL  Take 1 tablet (500 mg total) by mouth every 6 (six) hours.     ondansetron 4 MG disintegrating tablet  Commonly known as:  ZOFRAN ODT  Take 1 tablet (4 mg total) by mouth every 8 (eight) hours as needed for nausea.     oxyCODONE-acetaminophen 10-325 MG per tablet  Commonly known as:  PERCOCET  Take 1 tablet by mouth every 6 (six) hours as needed for pain.     Vitamin D3 5000 UNITS Caps  Take 1 tablet by mouth daily.       Allergies  Allergen Reactions  . Adhesive [Tape]     Unknown  . Cymbalta [Duloxetine Hcl] Other (See Comments)    Makes my head do  crazy things  . Ivp Dye [Iodinated Diagnostic Agents]     Unknown  . Other     Contrast/IV dye    The results of significant diagnostics from this hospitalization (including imaging, microbiology, ancillary and laboratory) are listed below for reference.    Significant Diagnostic Studies: Ct Abdomen Pelvis Wo Contrast  09/11/2013   *RADIOLOGY REPORT*  Clinical Data: Epigastric pain since September 27, diagnosed with gastroenteritis, with vomiting today, rule out small-bowel obstruction  CT ABDOMEN AND PELVIS WITHOUT CONTRAST  Technique:  Multidetector CT imaging of the abdomen and pelvis was  performed following the standard protocol without intravenous contrast.  Comparison: Abdominal series performed earlier this evening  Findings: Gallbladder is surgically absent.  Spleen is normal. Pancreas is normal.  Adrenal glands are normal.  Adjacent to the falciform ligament, there is a 3 cm zone of low attenuation in the hepatic parenchyma.  Left kidney is normal.  There is an extrarenal pelvis on the right, but the right kidney is otherwise normal.  Gastric wall is diffusely thickened.  There is a scattered wall thickening of moderate severity involving the jejunum.  The colon and demonstrates and moderately severe wall thickening diffusely, involving cecum and ascending colon as well as transverse colon and descending colon.  This process more mildly involve the sigmoid colon.  There is low attenuation in the wall of the more severely affected proximal large bowel consistent with fatty deposition suggesting that this may be a nonacute and progressive process.  Bladder is normal.  Reproductive organs are not well characterized. There is a small volume of free fluid in the pelvis.  Uterus appears to be surgically absent.  Appendix is not identified.  The aorta is not dilated.  The visualized portions of the lung bases are clear.  Bone windows reveal no acute osseous abnormalities.  IMPRESSION: There is no dilatation or caliber transition to suggest obstruction.  There is moderately severe wall thickening of the cecum, ascending colon, transverse colon, and descending colon, with evidence of fatty deposition in the wall that can be seen with a more chronic inflammatory process.  There is left significant wall thickening of the sigmoid colon.  There is also multi focal scattered wall thickening of the jejunum. Finally, there is mild gastric wall thickening.  The findings can be seen with infectious gastroenteritis.  Inflammatory bowel disease is another consideration that should be considered.  Small volume free  fluid in the pelvis within physiologic limits of normal.  Uterus appears to be absent.  Ovary is not specifically identified.   Original Report Authenticated By: Esperanza Heir, M.D.   Ct Head Wo Contrast  09/11/2013   CLINICAL DATA:  Headache.  EXAM: CT HEAD WITHOUT CONTRAST  TECHNIQUE: Contiguous axial images were obtained from the base of the skull through the vertex without intravenous contrast.  COMPARISON:  07/27/2013.  FINDINGS: The ventricles are normal in size and configuration. No extra-axial fluid collections are identified. The gray-white differentiation is normal. No CT findings for acute intracranial process such as hemorrhage or infarction. No mass lesions. The brainstem and cerebellum are grossly normal.  The bony structures are intact. The paranasal sinuses and mastoid air cells are clear. The globes are intact.  IMPRESSION: No acute intracranial findings or mass lesion.   Electronically Signed   By: Loralie Champagne M.D.   On: 09/11/2013 19:05   Dg Abd Acute W/chest  09/11/2013   *RADIOLOGY REPORT*  Clinical Data: Vomiting, nausea for 4 days  ACUTE ABDOMEN SERIES (  ABDOMEN 2 VIEW & CHEST 1 VIEW)  Comparison: 07/27/2013  Findings: The heart size and vascular pattern are normal.  The lungs are clear.  There is no free air.  Clips in the right upper quadrant indicate prior cholecystectomy.  Primarily gasless abdomen with fecal retention noted. Convex right scoliotic curvature lumbar spine.  IMPRESSION:  1.  No acute cardiopulmonary process 2.  Nonspecific bowel gas pattern, primarily gasless.  Uncommonly, this can be associated with obstruction. Consider CT abdomen and pelvis to further evaluate if indicated.   Original Report Authenticated By: Esperanza Heir, M.D.    Microbiology: Recent Results (from the past 240 hour(s))  URINE CULTURE     Status: None   Collection Time    09/09/13  6:20 PM      Result Value Range Status   Specimen Description URINE, CLEAN CATCH   Final   Special  Requests NONE   Final   Culture  Setup Time     Final   Value: 09/09/2013 18:50     Performed at [facility] Count     Final   Value: NO GROWTH     Performed at [facility]   Culture     Final   Value: NO GROWTH     Performed at [facility]   Report Status 09/11/2013 FINAL   Final     Labs: Basic Metabolic Panel:  Recent Labs Lab 09/09/13 1727 09/10/13 0608 09/11/13 1812 09/12/13 0514 09/14/13 0556  NA 140 141 140 140 142  K 3.4* 3.2* 3.8 3.4* 3.4*  CL 102 109 102 107 107  CO2 24 25 22 21 25   GLUCOSE 107* 83 92 77 93  BUN 15 12 6  4* 3*  CREATININE 0.75 0.80 0.72 0.73 0.87  CALCIUM 9.7 8.7 9.8 8.7 9.0   Liver Function Tests:  Recent Labs Lab 09/09/13 1727 09/10/13 0608 09/11/13 1812 09/12/13 0514  AST 32 33 20 14  ALT 24 32 34 20  ALKPHOS 92 73 103 74  BILITOT 0.8 0.9 0.6 0.5  PROT 7.7 6.0 8.3 6.1  ALBUMIN 4.4 3.4* 4.8 3.5    Recent Labs Lab 09/09/13 1728 09/11/13 1812  LIPASE 53 40   CBC:  Recent Labs Lab 09/09/13 1727 09/10/13 0608 09/11/13 1812 09/12/13 0514 09/14/13 0556  WBC 12.9* 11.3* 12.1* 11.2* 9.4  NEUTROABS 9.2*  --  9.6*  --   --   HGB 14.0 12.8 16.0* 13.0 12.7  HCT 41.0 37.9 46.2* 37.9 37.1  MCV 92.6 93.1 92.8 93.1 93.5  PLT 247 217 203 174 212    Active Problems:   Generalized anxiety disorder   Unspecified hypothyroidism   Active smoker   Chronic pain disorder   Gastroenteritis   Time coordinating discharge: 20 minutes  Signed:  Brendia Sacks, MD [facility] 09/15/2013, 9:48 AM

## 2013-09-15 NOTE — Progress Notes (Signed)
Patient with orders to be discharge home. Discharge instructions given, patient verbalized understanding. Prescriptions given. Patient stable. Patient left with spouse in private vehicle.   

## 2013-09-15 NOTE — Progress Notes (Signed)
[facility] PROGRESS NOTE  Melanie Shepard AVW:[id number] DOB: Jul 14, 1966 DOA: 09/11/2013 PCP: Lilyan Punt, MD  Assessment/Plan: 1. Nausea, vomiting, suspected infectious gastroenteritis: Continues to improve, tolerating diet, no vomiting. Suspect infectious. GI will followup as an outpatient for colonoscopy in the future. 2. Elevated TSH: Continue levothyroxine. She has just recently had a dose adjustment up to 125 mcg. 3. Chronic neck pain: Continue chronic pain medication. 4. Cigarette smoker: Recommend cessation.   Continue oral antibiotics as an outpatient   Prescription for Zofran  Discussed above with husband  Home today  Brendia Sacks, MD  [facility]  Pager [phone] If 7PM-7AM, please contact night-coverage at [email or web address], password [facility] 09/15/2013, 9:42 AM  LOS: 4 days   Summary: 47 year old woman recently admitted for nausea and vomiting which quickly resolved who presented 48 hours later with recurrent nausea and vomiting. CT suggested acute gastroenteritis.  Consultants:  Gastroenterology Procedures:    Antibiotics:  10/2 ciprofloxacin >> 10/11  10/2 Flagyl >> 10/11  HPI/Subjective: Continues to improve. No vomiting. Tolerating diet. No diarrhea. Minimal abdominal pain. Wants to go home.  Objective: Filed Vitals:   09/14/13 0629 09/14/13 1031 09/14/13 2042 09/15/13 0526  BP: 120/65 122/74 99/64 98/62   Pulse: 61  61 69  Temp: 98 F (36.7 C)  98.6 F (37 C) 98.3 F (36.8 C)  TempSrc: Oral  Oral Oral  Resp: 20  20 20   Height:      Weight:    67.45 kg (148 lb 11.2 oz)  SpO2: 99%  97% 96%    Intake/Output Summary (Last 24 hours) at 09/15/13 0942 Last data filed at 09/15/13 0516  Gross per 24 hour  Intake    720 ml  Output    700 ml  Net     20 ml     Filed Weights   09/11/13 2317 09/12/13 0500 09/15/13 0526  Weight: 66.6 kg (146 lb 13.2 oz) 67.4 kg (148 lb 9.4 oz) 67.45 kg (148 lb 11.2 oz)    Exam:   Afebrile, vital  signs stable.   General: Appears calm and comfortable.  Cardiovascular: Regular rate and rhythm. No murmur, rub, gallop.  Respiratory: Clear to auscultation bilaterally. No wheezes, rales, rhonchi. Normal respiratory effort.  Abdomen: Soft, nondistended, no significant tenderness.  Data Reviewed:  No new data  Scheduled Meds: . ALPRAZolam  1 mg Oral BID  . baclofen  10 mg Oral TID  . ciprofloxacin  500 mg Oral BID  . famotidine  20 mg Oral BID  . heparin  5,000 Units Subcutaneous Q8H  . hyoscyamine  0.125 mg Sublingual TID AC  . levothyroxine  125 mcg Oral QAC breakfast  . metoprolol succinate  25 mg Oral Daily  . metroNIDAZOLE  500 mg Oral Q6H   Continuous Infusions:    Active Problems:   Generalized anxiety disorder   Unspecified hypothyroidism   Active smoker   Chronic pain disorder   Gastroenteritis

## 2013-09-15 NOTE — Progress Notes (Signed)
Subjective: Tolerating meals, no difficulty with lunch or dinner. No further nausea/vomiting. No diarrhea. Abdomen "sore" but improved.   Objective: Vital signs in last 24 hours: Temp:  [98.3 F (36.8 C)-98.6 F (37 C)] 98.3 F (36.8 C) (10/06 0526) Pulse Rate:  [61-69] 69 (10/06 0526) Resp:  [20] 20 (10/06 0526) BP: (98-122)/(62-74) 98/62 mmHg (10/06 0526) SpO2:  [96 %-97 %] 96 % (10/06 0526) Weight:  [148 lb 11.2 oz (67.45 kg)] 148 lb 11.2 oz (67.45 kg) (10/06 0526) Last BM Date: 09/11/13 General:   Alert and oriented, pleasant Head:  Normocephalic and atraumatic. Heart:  S1, S2 present, no murmurs noted.  Lungs: Clear to auscultation bilaterally, without wheezing, rales, or rhonchi.  Abdomen:  Bowel sounds present, soft, non-tender, non-distended. No HSM or hernias noted. No rebound or guarding. No masses appreciated  Msk:  Symmetrical without gross deformities. Normal posture. Neurologic:  Alert and  oriented x4;  grossly normal neurologically. Psych:  Alert and cooperative. Normal mood and affect.  Intake/Output from previous day: 10/05 0701 - 10/06 0700 In: 1080 [P.O.:1080] Out: 700 [Urine:700] Intake/Output this shift:    Lab Results:  Recent Labs  09/14/13 0556  WBC 9.4  HGB 12.7  HCT 37.1  PLT 212   BMET  Recent Labs  09/14/13 0556  NA 142  K 3.4*  CL 107  CO2 25  GLUCOSE 93  BUN 3*  CREATININE 0.87  CALCIUM 9.0    Assessment: 47 year old female admitted with likely infectious gastroenteritis, clinically improved since admission. GI stool pathogen unable to be obtained due to lack of diarrhea. Patient desires to go home today, which is appropriate. Will arrange outpatient follow-up in our office in about 4 weeks.    Plan: Continue oral Cipro and Flagyl for total of 10 days Outpatient colonoscopy  Anticipate discharge today.  Signing off; will follow as outpatient.   Nira Retort, ANP-BC [facility]      LOS: 4 days    09/15/2013, 7:58 AM

## 2013-09-16 ENCOUNTER — Ambulatory Visit: Payer: BC Managed Care – PPO | Admitting: Family Medicine

## 2013-09-17 ENCOUNTER — Telehealth: Payer: Self-pay | Admitting: Family Medicine

## 2013-09-17 NOTE — Telephone Encounter (Signed)
Ok thru visit to [employer]

## 2013-09-17 NOTE — Telephone Encounter (Signed)
Patient states she needs a note for work.  She has been in the hospital, has a follow up appointment with Dr. Lorin Picket on September 23, 2013 @ 1:30pm

## 2013-09-18 ENCOUNTER — Encounter: Payer: Self-pay | Admitting: Family Medicine

## 2013-09-18 NOTE — Telephone Encounter (Signed)
LMOM letting pt know work note ready.

## 2013-09-23 ENCOUNTER — Ambulatory Visit: Payer: BC Managed Care – PPO | Admitting: Family Medicine

## 2013-09-26 ENCOUNTER — Encounter: Payer: Self-pay | Admitting: Family Medicine

## 2013-09-26 ENCOUNTER — Ambulatory Visit (INDEPENDENT_AMBULATORY_CARE_PROVIDER_SITE_OTHER): Payer: BC Managed Care – PPO | Admitting: Family Medicine

## 2013-09-26 VITALS — BP 118/68 | Temp 98.4°F | Ht 67.0 in | Wt 148.0 lb

## 2013-09-26 DIAGNOSIS — G894 Chronic pain syndrome: Secondary | ICD-10-CM

## 2013-09-26 DIAGNOSIS — R110 Nausea: Secondary | ICD-10-CM

## 2013-09-26 MED ORDER — TRAZODONE HCL 100 MG PO TABS: 100.0000 mg | ORAL_TABLET | Freq: Every day | ORAL | Status: DC

## 2013-09-26 NOTE — Progress Notes (Signed)
  Subjective:    Patient ID: Melanie Shepard, female    DOB: 10-23-1966, 47 y.o.   MRN: [id number]  HPIHere for a follow up from hospital. Still having nausea. Taking zofran. This patient was in the hospital 2 separate times over the past 6 weeks initially just nausea but then the second time had colitis along with nausea was treated with Cipro and Flagyl. Also Dr. Kendell Bane of [facility] gastro[facility]ology saw her. The CAT scan showed significant colitis involvement all the way up to the terminal ileum. Right kidney and GI plans on doing a colonoscopy in the near future. She denies any high fever sweats chills nausea vomiting currently but she does state intermittent nausea. She states the medications make her sick she is going to be finishing up her medicines over the next 2 days.  She has chronic neck pain back pain hip pain. She takes Percocet 4 times a day. She does not abuse these. She is thinking about going back to see the pain management doctor to see if there are other avenues that they can do to help her out.  PMH chronic back pain and neck pain. Patient disabled. Also chronic anxiety issues. Also having some sleep issues currently. Not suicidal.     Review of Systems negative chest pain shortness of breath positive nausea negative abdominal pain negative leg pain positive left hip pain positive neck pain positive trapezius and arm pain     Objective:   Physical Exam  Lungs are clear hearts regular abdomen soft no guarding or rebound some slight tenderness throughout the abdomen neck no masses felt      Assessment & Plan:  #1 nausea -- patient was told after she finishes her antibiotic she ought to feel better if she has persistent nausea she ought to let us know. Also if developing diarrhea anywhere in the next 1-2 weeks let us know because she is at risk of developing Clostridium difficile. In addition to this the patient should followup with [facility] she should get a  colonoscopy later this year in May may need to do some biopsies to look for any type of colitis or Crohn's.  #2 chronic pain-I. believe this patient is on the level. I believe she does have severe pain. She will probably need stronger pain medicines than what we are using. She states she will be going back to Dr. Laurian Brim for further evaluation I believe that that would be reasonable. Hopefully they can suggest a regimen that would be helpful for her. She is realistic that pain management helps with discomfort but does not totally take away the pain.  She ought to followup here in 3 months. Trazodone 100 mg half a tablet or whole tablet at night to try to help with sleep

## 2013-09-30 DIAGNOSIS — Z0289 Encounter for other administrative examinations: Secondary | ICD-10-CM

## 2013-10-03 ENCOUNTER — Emergency Department (HOSPITAL_COMMUNITY)
Admission: EM | Admit: 2013-10-03 | Discharge: 2013-10-04 | Disposition: A | Discharge status: Home / Self Care | Payer: BC Managed Care – PPO | Attending: Emergency Medicine | Admitting: Emergency Medicine

## 2013-10-03 DIAGNOSIS — G8929 Other chronic pain: Secondary | ICD-10-CM | POA: Insufficient documentation

## 2013-10-03 DIAGNOSIS — R1110 Vomiting, unspecified: Secondary | ICD-10-CM | POA: Insufficient documentation

## 2013-10-03 DIAGNOSIS — R5381 Other malaise: Secondary | ICD-10-CM | POA: Insufficient documentation

## 2013-10-03 DIAGNOSIS — M1610 Unilateral primary osteoarthritis, unspecified hip: Secondary | ICD-10-CM | POA: Insufficient documentation

## 2013-10-03 DIAGNOSIS — Z9889 Other specified postprocedural states: Secondary | ICD-10-CM | POA: Insufficient documentation

## 2013-10-03 DIAGNOSIS — E039 Hypothyroidism, unspecified: Secondary | ICD-10-CM | POA: Insufficient documentation

## 2013-10-03 DIAGNOSIS — Z90710 Acquired absence of both cervix and uterus: Secondary | ICD-10-CM | POA: Insufficient documentation

## 2013-10-03 DIAGNOSIS — R197 Diarrhea, unspecified: Secondary | ICD-10-CM | POA: Insufficient documentation

## 2013-10-03 DIAGNOSIS — Z9851 Tubal ligation status: Secondary | ICD-10-CM | POA: Insufficient documentation

## 2013-10-03 DIAGNOSIS — R1084 Generalized abdominal pain: Secondary | ICD-10-CM | POA: Insufficient documentation

## 2013-10-03 DIAGNOSIS — Z7982 Long term (current) use of aspirin: Secondary | ICD-10-CM | POA: Insufficient documentation

## 2013-10-03 DIAGNOSIS — F17200 Nicotine dependence, unspecified, uncomplicated: Secondary | ICD-10-CM | POA: Insufficient documentation

## 2013-10-03 DIAGNOSIS — F411 Generalized anxiety disorder: Secondary | ICD-10-CM | POA: Insufficient documentation

## 2013-10-03 DIAGNOSIS — Z79899 Other long term (current) drug therapy: Secondary | ICD-10-CM | POA: Insufficient documentation

## 2013-10-03 DIAGNOSIS — Z87828 Personal history of other (healed) physical injury and trauma: Secondary | ICD-10-CM | POA: Insufficient documentation

## 2013-10-04 ENCOUNTER — Encounter (HOSPITAL_COMMUNITY): Payer: Self-pay | Admitting: Emergency Medicine

## 2013-10-04 LAB — CBC WITH DIFFERENTIAL/PLATELET
Basophils Absolute: 0 10*3/uL (ref 0.0–0.1)
Eosinophils Absolute: 0 10*3/uL (ref 0.0–0.7)
Eosinophils Relative: 0 % (ref 0–5)
HCT: 39.5 % (ref 36.0–46.0)
MCH: 32.7 pg (ref 26.0–34.0)
MCV: 94.3 fL (ref 78.0–100.0)
Monocytes Absolute: 1 10*3/uL (ref 0.1–1.0)
Monocytes Relative: 5 % (ref 3–12)
Neutro Abs: 14.7 10*3/uL — ABNORMAL HIGH (ref 1.7–7.7)
Platelets: 284 10*3/uL (ref 150–400)
RDW: 14.6 % (ref 11.5–15.5)

## 2013-10-04 LAB — BASIC METABOLIC PANEL
BUN: 8 mg/dL (ref 6–23)
CO2: 24 mEq/L (ref 19–32)
Calcium: 9.6 mg/dL (ref 8.4–10.5)
Creatinine, Ser: 0.69 mg/dL (ref 0.50–1.10)
Glucose, Bld: 119 mg/dL — ABNORMAL HIGH (ref 70–99)

## 2013-10-04 MED ORDER — PROMETHAZINE HCL 25 MG PO TABS: 25.0000 mg | ORAL_TABLET | Freq: Four times a day (QID) | ORAL | Status: DC | PRN

## 2013-10-04 MED ORDER — ONDANSETRON HCL 4 MG/2ML IJ SOLN
4.0000 mg | Freq: Once | INTRAMUSCULAR | Status: AC
  Administered 2013-10-04: 4 mg via INTRAVENOUS

## 2013-10-04 MED ORDER — ONDANSETRON HCL 4 MG/2ML IJ SOLN
4.0000 mg | Freq: Once | INTRAMUSCULAR | Status: AC
  Administered 2013-10-04: 4 mg via INTRAVENOUS
  Filled 2013-10-04: qty 2

## 2013-10-04 MED ORDER — MORPHINE SULFATE 4 MG/ML IJ SOLN
4.0000 mg | Freq: Once | INTRAMUSCULAR | Status: AC
  Administered 2013-10-04: 4 mg via INTRAVENOUS

## 2013-10-04 MED ORDER — MORPHINE SULFATE 4 MG/ML IJ SOLN
INTRAMUSCULAR | Status: AC
  Filled 2013-10-04: qty 1

## 2013-10-04 MED ORDER — MORPHINE SULFATE 4 MG/ML IJ SOLN
4.0000 mg | Freq: Once | INTRAMUSCULAR | Status: AC
Start: 2013-10-04 — End: 2013-10-04
  Administered 2013-10-04: 4 mg via INTRAVENOUS
  Filled 2013-10-04: qty 1

## 2013-10-04 MED ORDER — SODIUM CHLORIDE 0.9 % IV BOLUS (SEPSIS)
1000.0000 mL | Freq: Once | INTRAVENOUS | Status: AC
  Administered 2013-10-04: 1000 mL via INTRAVENOUS

## 2013-10-04 MED ORDER — ONDANSETRON HCL 4 MG/2ML IJ SOLN
4.0000 mg | Freq: Once | INTRAMUSCULAR | Status: DC
  Filled 2013-10-04: qty 2

## 2013-10-04 MED ORDER — ONDANSETRON HCL 4 MG/2ML IJ SOLN
INTRAMUSCULAR | Status: AC
  Filled 2013-10-04: qty 2

## 2013-10-04 MED ORDER — MORPHINE SULFATE 4 MG/ML IJ SOLN
4.0000 mg | Freq: Once | INTRAMUSCULAR | Status: AC
  Administered 2013-10-04: 4 mg via INTRAVENOUS
  Filled 2013-10-04: qty 1

## 2013-10-04 NOTE — ED Provider Notes (Signed)
CSN: [id number]     Arrival date & time 10/03/13  2352 History   First MD Initiated Contact with Patient 10/04/13 0047     Chief Complaint  Patient presents with  . Abdominal Pain  . Emesis  . Diarrhea    Patient is a 47 y.o. female presenting with abdominal pain, vomiting, and diarrhea. The history is provided by the patient.  Abdominal Pain Pain location:  Generalized Pain quality: cramping   Pain severity:  Moderate Onset quality:  Gradual Duration: several days. Timing:  Constant Progression:  Worsening Chronicity:  Recurrent Relieved by:  Nothing Worsened by:  Vomiting Associated symptoms: diarrhea, fatigue, nausea and vomiting   Associated symptoms: no chest pain, no cough, no dysuria, no fever, no hematochezia and no shortness of breath   Emesis Associated symptoms: abdominal pain and diarrhea   Associated symptoms: no headaches   Diarrhea Associated symptoms: abdominal pain and vomiting   Associated symptoms: no fever and no headaches   pt reports she has had increasing diarrhea and now with vomiting (no blood in stool/vomit) She also reports increasing abdominal pain No fever No cp/sob No travel She reports she just recently finished her antibiotics for colon infection and her symptoms are worsening   Past Medical History  Diagnosis Date  . Post concussion syndrome 1999  . 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 Cervical pain (neck)     chronic  . Hypothyroidism   . Anxiety   . Arthritis     right hip   Past Surgical History  Procedure Laterality Date  . Cervix surgery  10/11  . Cardiovascular stress test  03/12    normal  . Tubal ligation Bilateral   . Knee surgery Left 12/89  . Back surgery      cervical fusion  . Abdominal hysterectomy    . Cardiac catheterization  2014    mild CAD, no significant stenosis   Family History  Problem Relation Age of Onset  . Coronary artery disease Mother 58    MI  . Colon cancer Mother     42s   History   Substance Use Topics  . Smoking status: Current Every Day Smoker -- 1.00 packs/day for 30 years    Types: Cigarettes  . Smokeless tobacco: Not on file  . Alcohol Use: No   OB History   Grav Para Term Preterm Abortions TAB SAB Ect Mult Living                 Review of Systems  Constitutional: Positive for fatigue. Negative for fever.  Respiratory: Negative for cough and shortness of breath.   Cardiovascular: Negative for chest pain.  Gastrointestinal: Positive for nausea, vomiting, abdominal pain and diarrhea. Negative for hematochezia.  Genitourinary: Negative for dysuria.  Neurological: Negative for headaches.  All other systems reviewed and are negative.    Allergies  Adhesive; Cymbalta; Ivp dye; and Other  Home Medications   Current Outpatient Rx  Name  Route  Sig  Dispense  Refill  . ALPRAZolam (XANAX) 1 MG tablet   Oral   Take 1 tablet (1 mg total) by mouth 2 (two) times daily.   60 tablet   3   . aspirin 81 MG tablet   Oral   Take 1 tablet (81 mg total) by mouth daily.         . baclofen (LIORESAL) 10 MG tablet   Oral   Take 10 mg by mouth 3 (three) times daily.         Marland Kitchen  Cholecalciferol (VITAMIN D3) 5000 UNITS CAPS   Oral   Take 1 tablet by mouth daily.         Marland Kitchen levothyroxine (SYNTHROID, LEVOTHROID) 125 MCG tablet   Oral   Take 1 tablet (125 mcg total) by mouth daily before breakfast.   30 tablet   5     Cancel any refills on the levothyroxine 100 mcg.   . metoprolol succinate (TOPROL-XL) 25 MG 24 hr tablet   Oral   Take 25 mg by mouth daily.         . ondansetron (ZOFRAN ODT) 4 MG disintegrating tablet   Oral   Take 1 tablet (4 mg total) by mouth every 8 (eight) hours as needed for nausea.   20 tablet   0   . oxyCODONE-acetaminophen (PERCOCET) 10-325 MG per tablet   Oral   Take 1 tablet by mouth every 6 (six) hours as needed for pain.   120 tablet   0     MAY FILL 09/11/13   . pantoprazole (PROTONIX) 40 MG tablet                . traZODone (DESYREL) 100 MG tablet   Oral   Take 1 tablet (100 mg total) by mouth at bedtime.   30 tablet   3   . ciprofloxacin (CIPRO) 500 MG tablet   Oral   Take 1 tablet (500 mg total) by mouth 2 (two) times daily.   11 tablet   0   . metroNIDAZOLE (FLAGYL) 500 MG tablet   Oral   Take 1 tablet (500 mg total) by mouth every 6 (six) hours.   22 tablet   0   . Oxycodone HCl 10 MG TABS                BP 117/72  Pulse 83  Temp(Src) 98.9 F (37.2 C) (Oral)  Resp 18  Ht 5\' 7"  (1.702 m)  Wt 140 lb (63.504 kg)  BMI 21.92 kg/m2  SpO2 97% Physical Exam CONSTITUTIONAL: Well developed/well nourished HEAD: Normocephalic/atraumatic EYES: EOMI/PERRL, no icterus ENMT: Mucous membranes dry NECK: supple no meningeal signs SPINE:entire spine nontender CV: S1/S2 noted, no murmurs/rubs/gallops noted LUNGS: Lungs are clear to auscultation bilaterally, no apparent distress ABDOMEN: soft, diffuse mild abdominal tenderness, no rebound or guarding GU:no cva tenderness NEURO: Pt is awake/alert, moves all extremitiesx4 EXTREMITIES: pulses normal, full ROM SKIN: warm, color normal PSYCH: no abnormalities of mood noted  ED Course  Procedures (including critical care time) Labs Review Labs Reviewed  CLOSTRIDIUM DIFFICILE BY PCR  BASIC METABOLIC PANEL  CBC WITH DIFFERENTIAL  URINALYSIS, ROUTINE W REFLEX MICROSCOPIC   Imaging Review No results found.  EKG Interpretation   None      3:11 AM Pt starting to feel improved Her abdomen is soft.  I don't feel she needs emergent CT imaging Will continue to hydrate and treat nausea/pain 4:25 AM Pt improved She fees ok for d/c home Advised need for close PCP f/u in two days. I doubt acute abd process at this time and I don't feel she needs emergent imaging at this time  MDM  No diagnosis found. Nursing notes including past medical history and social history reviewed and considered in documentation Labs/vital reviewed and  considered Previous records reviewed and considered - recent PCP visit reviewed.  Pt with colitis, she was on cipro/flagyl and is at risk for c-dif     Joya Gaskins, MD 10/04/13 0425

## 2013-10-04 NOTE — ED Notes (Signed)
Pt states she just finished antibiotics for colon infection, states she has been vomiting and having diarrhea and severe abd pain

## 2013-10-09 ENCOUNTER — Ambulatory Visit (INDEPENDENT_AMBULATORY_CARE_PROVIDER_SITE_OTHER): Payer: BC Managed Care – PPO | Admitting: Family Medicine

## 2013-10-09 ENCOUNTER — Encounter: Payer: Self-pay | Admitting: Family Medicine

## 2013-10-09 VITALS — BP 98/70 | Temp 98.2°F | Ht 67.0 in | Wt 140.0 lb

## 2013-10-09 DIAGNOSIS — D72829 Elevated white blood cell count, unspecified: Secondary | ICD-10-CM

## 2013-10-09 DIAGNOSIS — R109 Unspecified abdominal pain: Secondary | ICD-10-CM

## 2013-10-09 DIAGNOSIS — IMO0001 Reserved for inherently not codable concepts without codable children: Secondary | ICD-10-CM

## 2013-10-09 LAB — CBC WITH DIFFERENTIAL/PLATELET
Basophils Absolute: 0.1 10*3/uL (ref 0.0–0.1)
Eosinophils Absolute: 0.4 10*3/uL (ref 0.0–0.7)
Eosinophils Relative: 2 % (ref 0–5)
Lymphocytes Relative: 25 % (ref 12–46)
Lymphs Abs: 4.7 10*3/uL — ABNORMAL HIGH (ref 0.7–4.0)
MCH: 33.5 pg (ref 26.0–34.0)
MCV: 94.9 fL (ref 78.0–100.0)
Neutro Abs: 12.8 10*3/uL — ABNORMAL HIGH (ref 1.7–7.7)
Neutrophils Relative %: 68 % (ref 43–77)
Platelets: 364 10*3/uL (ref 150–400)
RBC: 5.26 MIL/uL — ABNORMAL HIGH (ref 3.87–5.11)
RDW: 14.6 % (ref 11.5–15.5)
WBC: 19 10*3/uL — ABNORMAL HIGH (ref 4.0–10.5)

## 2013-10-09 IMAGING — CT CT ABD-PELV W/O CM
2 of 4 series · 15 of 46 positions shown, 17 images · non-contrast
Comparison: Abdominal series performed earlier this evening

CLINICAL DATA: Epigastric pain since [DATE], diagnosed with
gastroenteritis, with vomiting today, rule out small-bowel
obstruction

CT ABDOMEN AND PELVIS WITHOUT CONTRAST
TECHNIQUE: Multidetector CT imaging of the abdomen and pelvis was
performed following the standard protocol without intravenous
contrast.

[Series 2: abdomen/pelvis w/o contrast · axial · non-contrast · 0.71mm/px · z∈[-410,-24]mm · 12 of 88 slices shown, 14 images]
[im 7/88  soft-tissue]
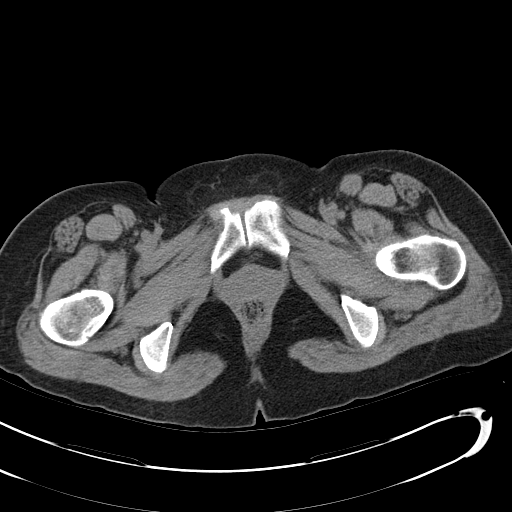
[im 7/88  bone]
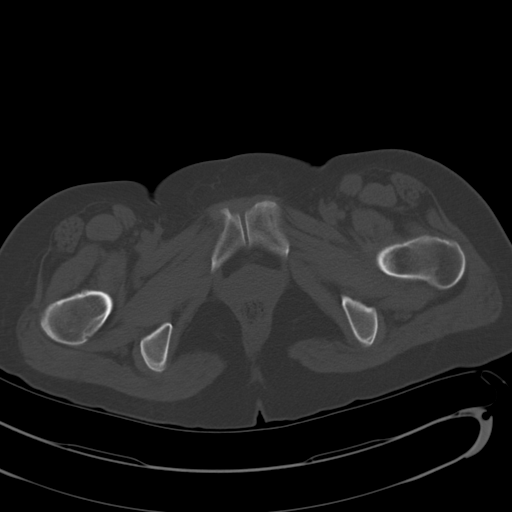
[im 14/88  soft-tissue]
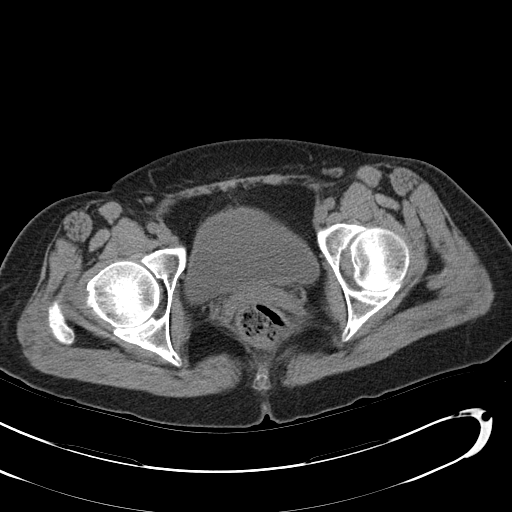
[im 21/88  soft-tissue]
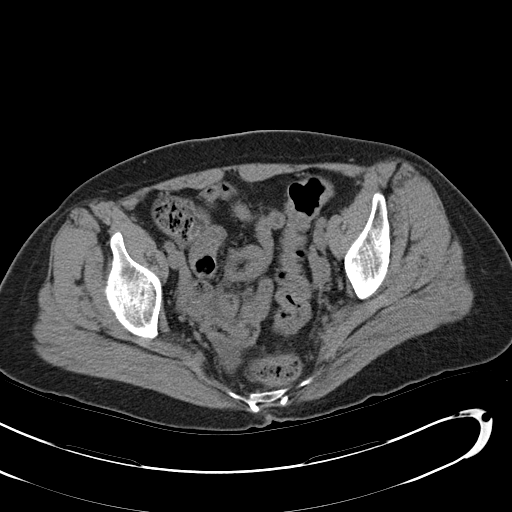
[im 28/88  soft-tissue]
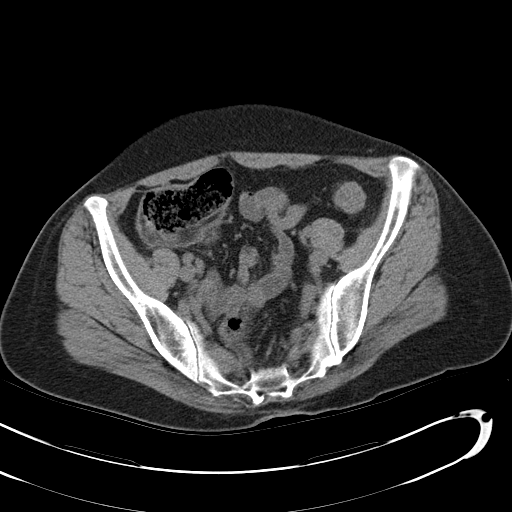
[im 35/88  soft-tissue]
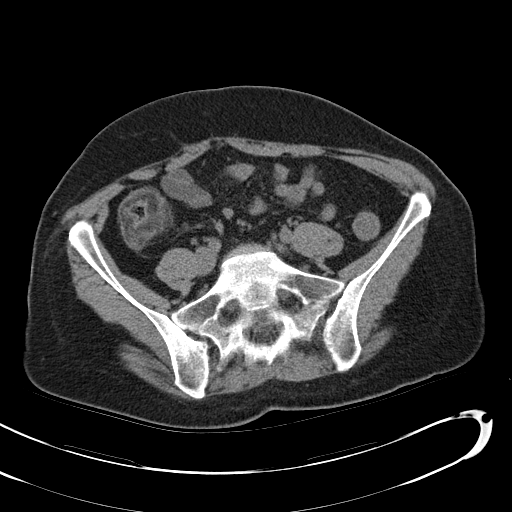
[im 42/88  soft-tissue]
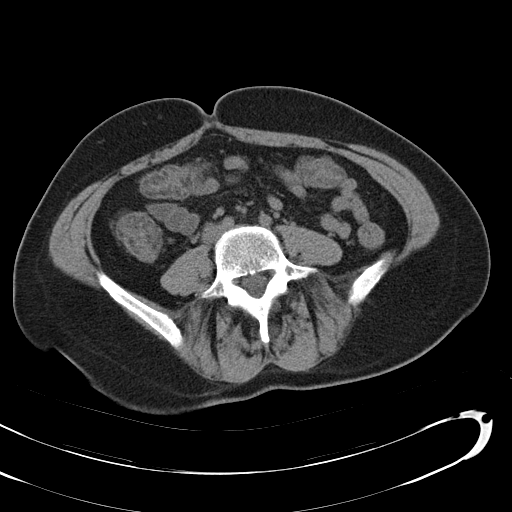
[im 49/88  soft-tissue]
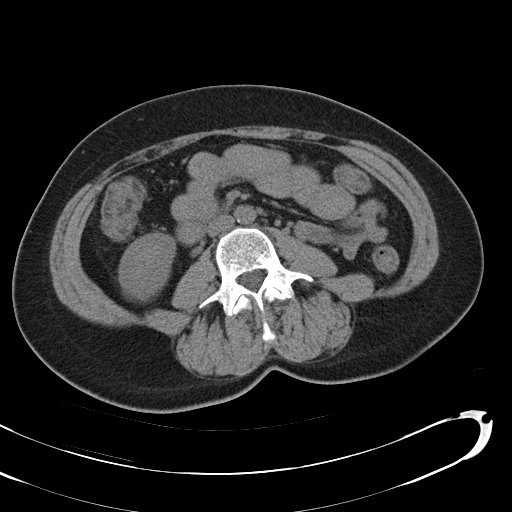
[im 56/88  soft-tissue]
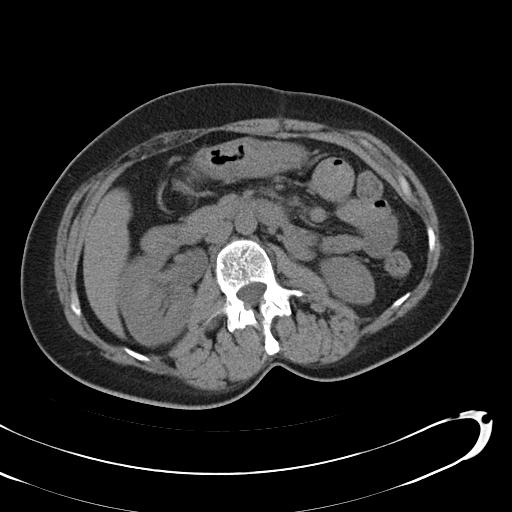
[im 63/88  soft-tissue]
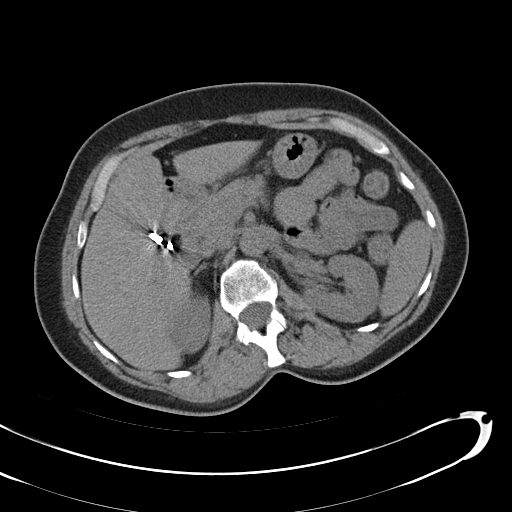
[im 63/88  bone]
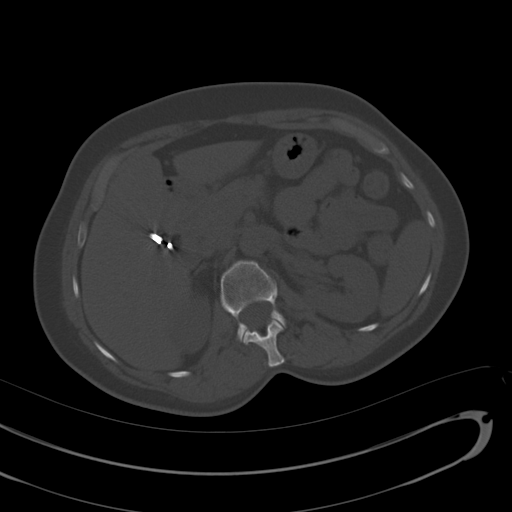
[im 70/88  soft-tissue]
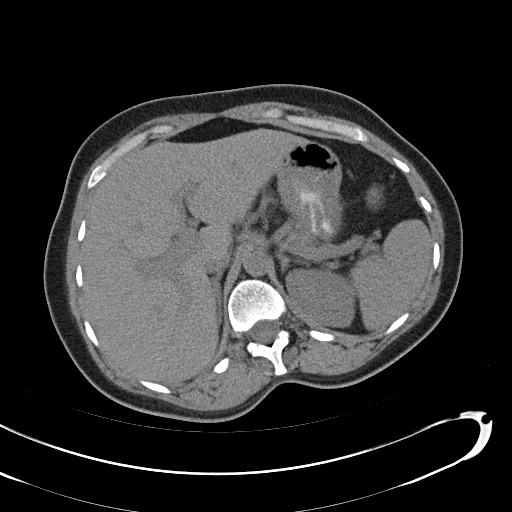
[im 77/88  soft-tissue]
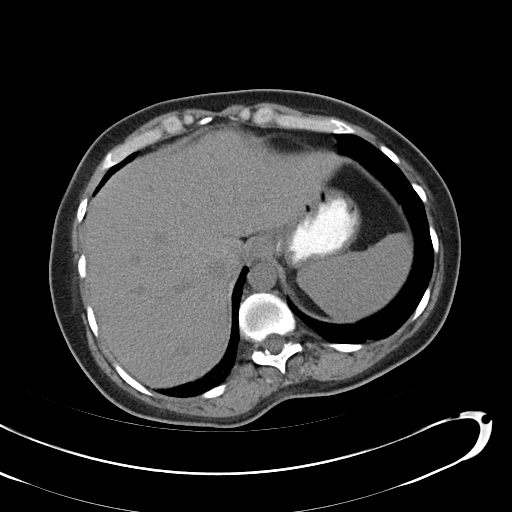
[im 84/88  soft-tissue]
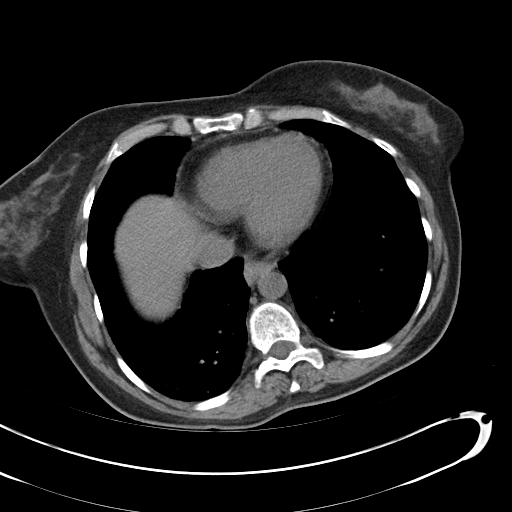

[Series 4: mpr cor (id) · coronal · 0.68mm/px · 3 of 74 slices shown]
[im 25/74  soft-tissue]
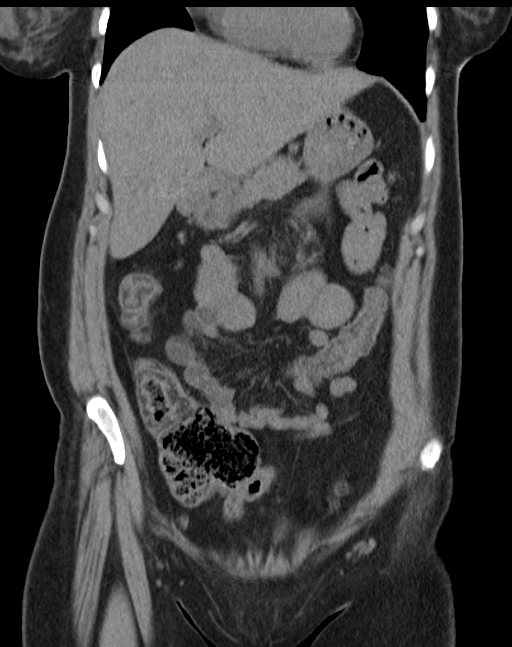
[im 33/74  soft-tissue]
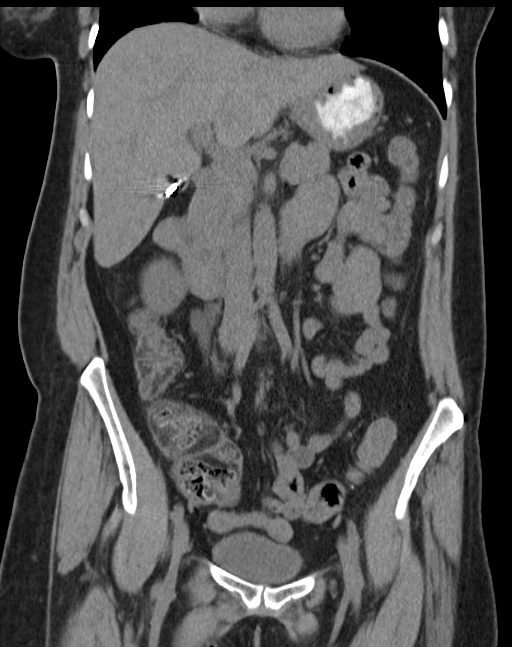
[im 41/74  soft-tissue]
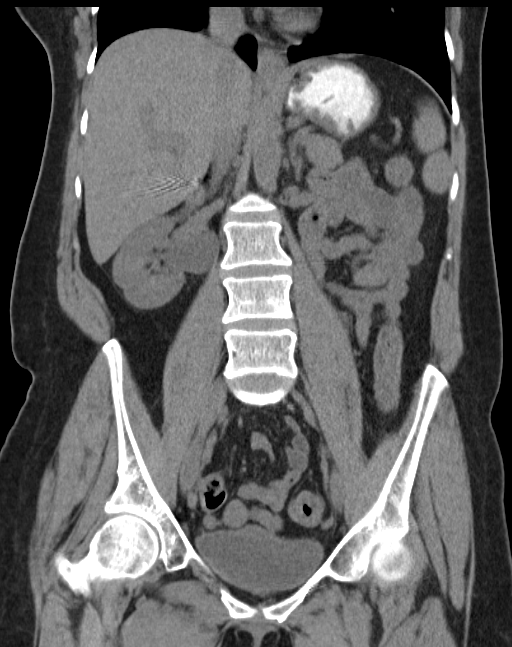

[15 of 46 positions shown; findings below may reference images not displayed]

FINDINGS: Gallbladder is surgically absent.  Spleen is normal.
Pancreas is normal.  Adrenal glands are normal.  Adjacent to the
falciform ligament, there is a 3 cm zone of low attenuation in the
hepatic parenchyma.  Left kidney is normal.  There is an extrarenal
pelvis on the right, but the right kidney is otherwise normal.

Gastric wall is diffusely thickened.  There is a scattered wall
thickening of moderate severity involving the jejunum.  The colon
and demonstrates and moderately severe wall thickening diffusely,
involving cecum and ascending colon as well as transverse colon and
descending colon.  This process more mildly involve the sigmoid
colon.  There is low attenuation in the wall of the more severely
affected proximal large bowel consistent with fatty deposition
suggesting that this may be a nonacute and progressive process.

Bladder is normal.  Reproductive organs are not well characterized.
There is a small volume of free fluid in the pelvis.  Uterus
appears to be surgically absent.  Appendix is not identified.  The
aorta is not dilated.

The visualized portions of the lung bases are clear.  Bone windows
reveal no acute osseous abnormalities.
IMPRESSION: There is no dilatation or caliber transition to suggest
obstruction.  There is moderately severe wall thickening of the
cecum, ascending colon, transverse colon, and descending colon,
with evidence of fatty deposition in the wall that can be seen with
a more chronic inflammatory process.  There is left significant
wall thickening of the sigmoid colon.  There is also multi focal
scattered wall thickening of the jejunum. Finally, there is mild
gastric wall thickening.  The findings can be seen with infectious
gastroenteritis.  Inflammatory bowel disease is another
consideration that should be considered.

Small volume free fluid in the pelvis within physiologic limits of
normal.  Uterus appears to be absent.  Ovary is not specifically
identified.

## 2013-10-09 IMAGING — CT CT HEAD W/O CM
1 series · 16 of 30 positions shown, 20 images · non-contrast
Comparison: 07/27/2013.

CLINICAL DATA: Headache.

EXAM:
CT HEAD WITHOUT CONTRAST
TECHNIQUE: Contiguous axial images were obtained from the base of the skull
through the vertex without intravenous contrast.

[Series 2: headseq 4.8 h37s · axial · 0.43mm/px · z∈[+77,+214]mm · 16 of 30 slices shown, 20 images]
[im 2/30  brain]
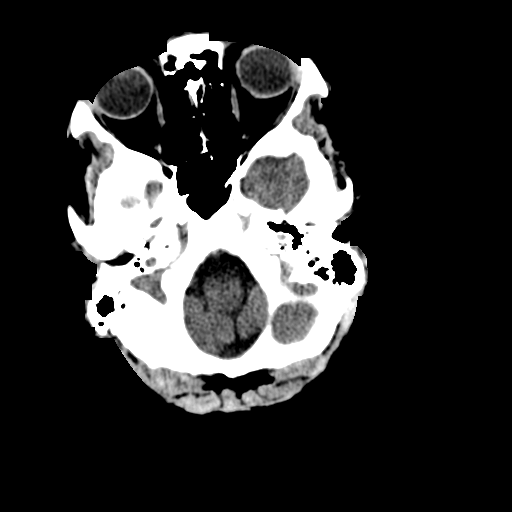
[im 2/30  bone]
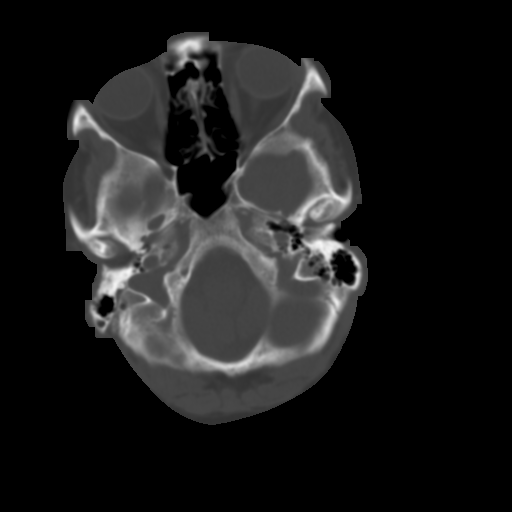
[im 4/30  brain]
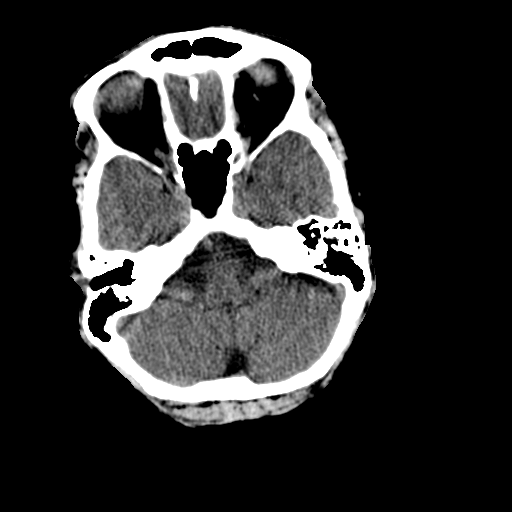
[im 6/30  brain]
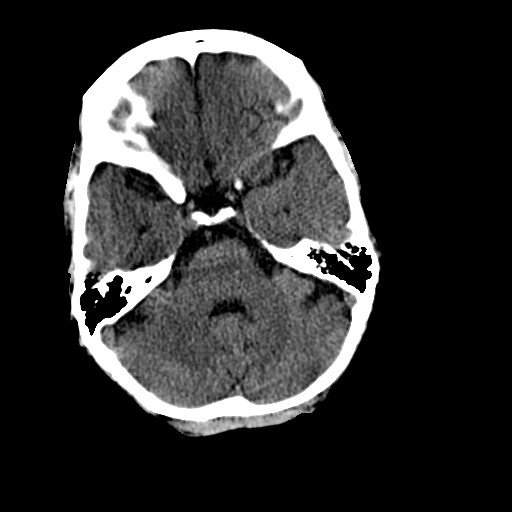
[im 8/30  brain]
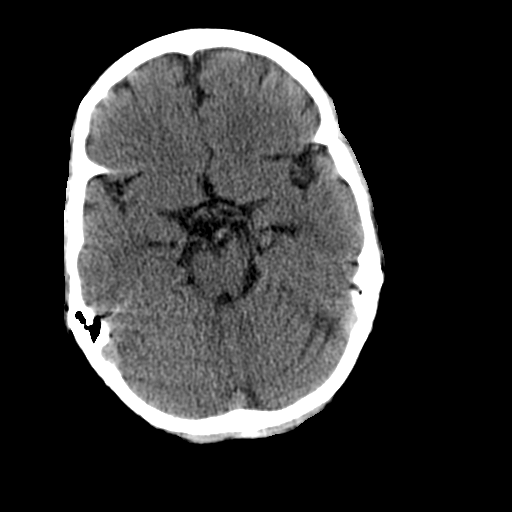
[im 9/30  brain]
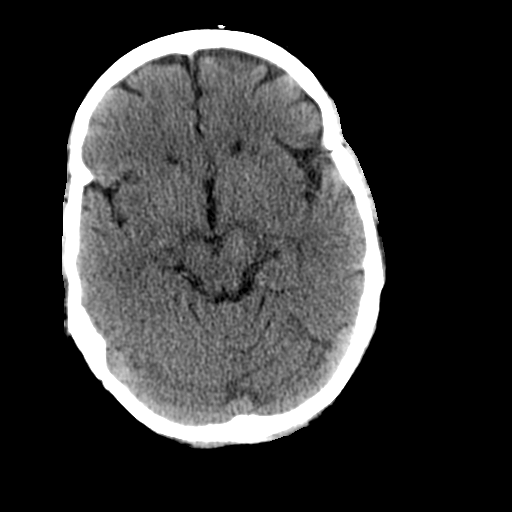
[im 9/30  bone]
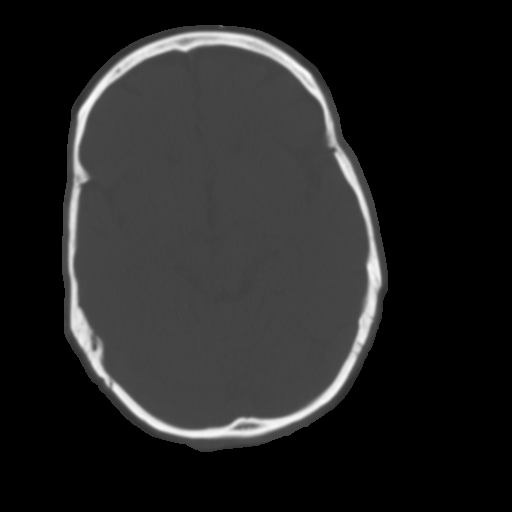
[im 11/30  brain]
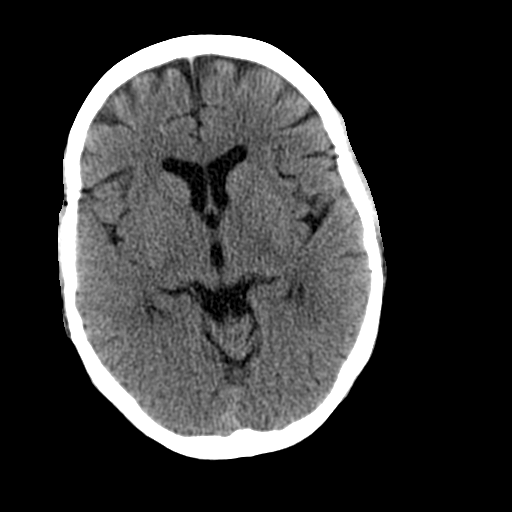
[im 13/30  brain]
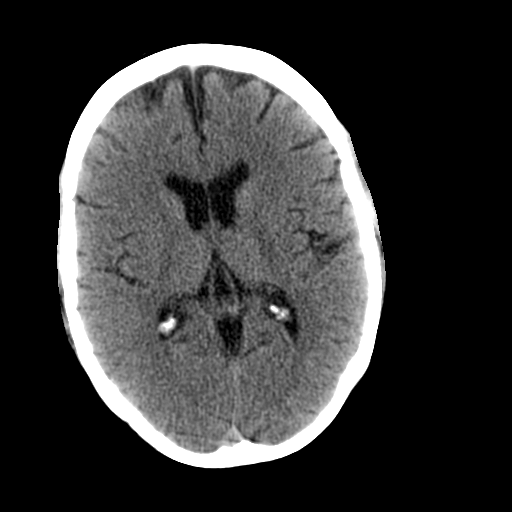
[im 15/30  brain]
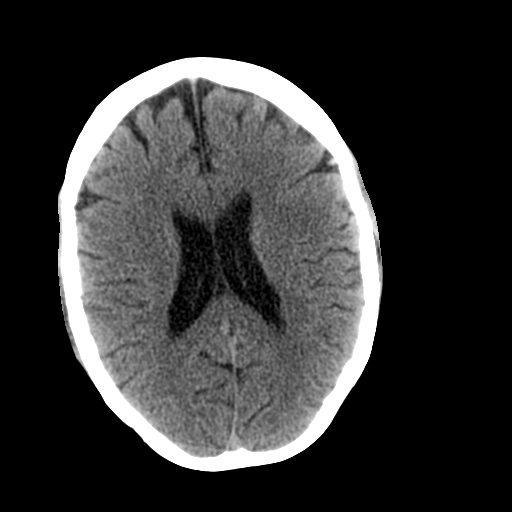
[im 16/30  brain]
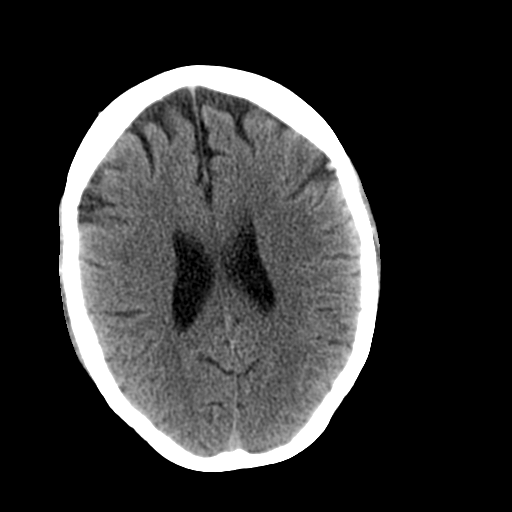
[im 16/30  bone]
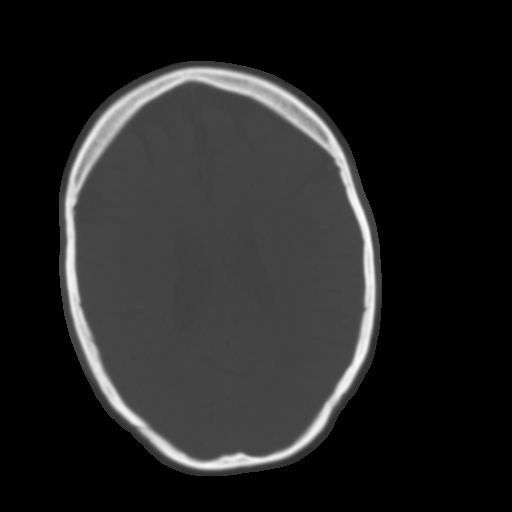
[im 18/30  brain]
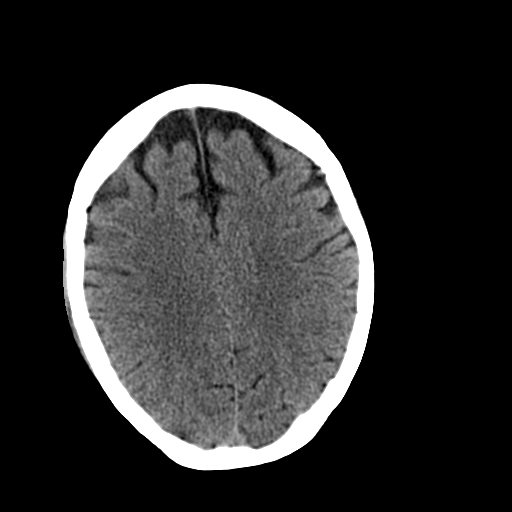
[im 20/30  brain]
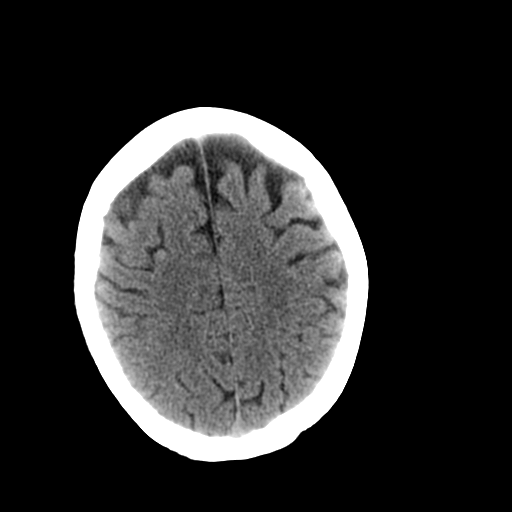
[im 22/30  brain]
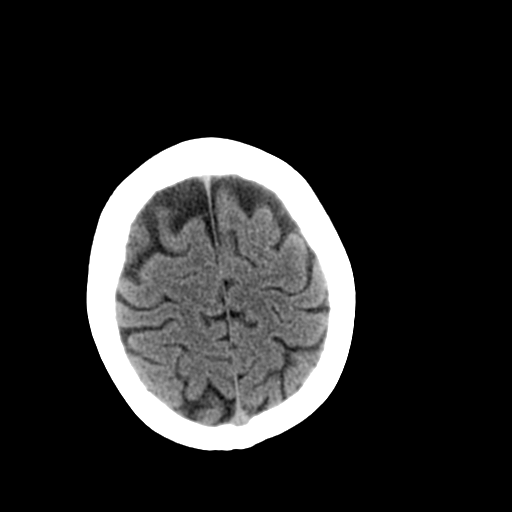
[im 23/30  brain]
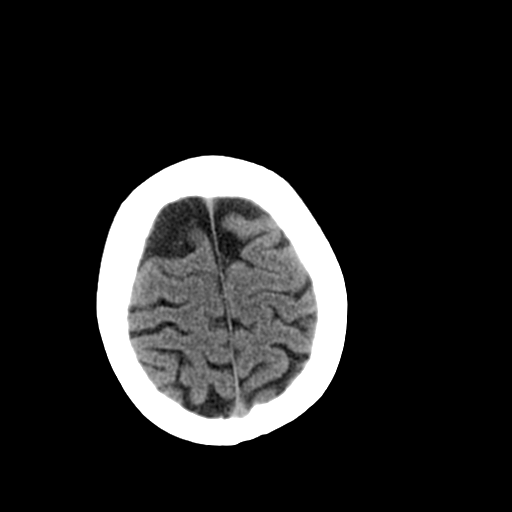
[im 23/30  bone]
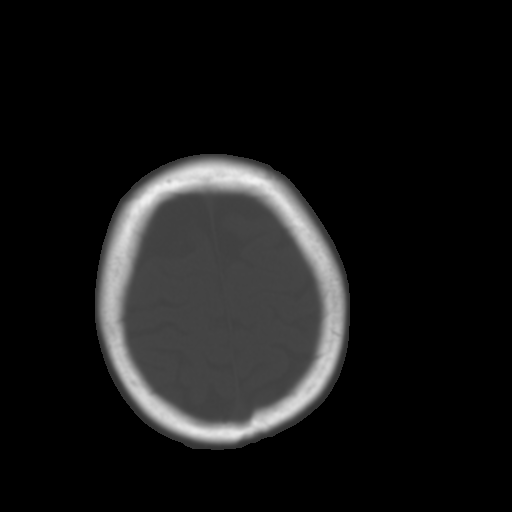
[im 25/30  brain]
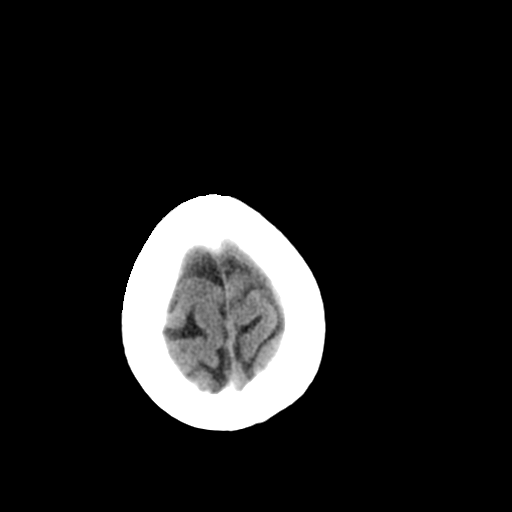
[im 27/30  brain]
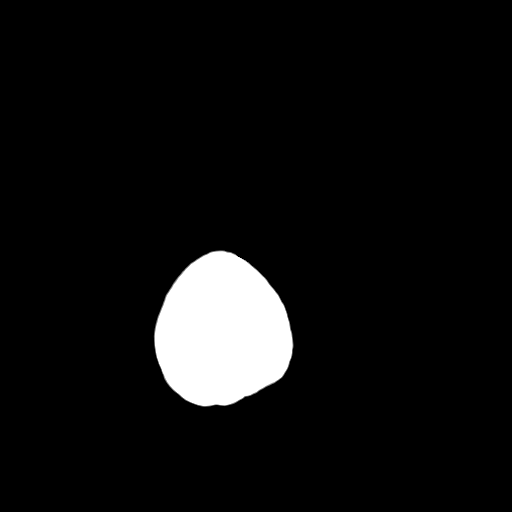
[im 29/30  brain]
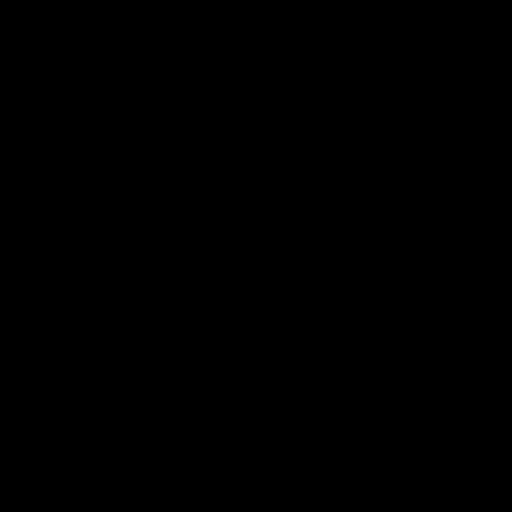

[16 of 30 positions shown; findings below may reference images not displayed]

FINDINGS: The ventricles are normal in size and configuration. No extra-axial
fluid collections are identified. The gray-white differentiation is
normal. No CT findings for acute intracranial process such as
hemorrhage or infarction. No mass lesions. The brainstem and
cerebellum are grossly normal.

The bony structures are intact. The paranasal sinuses and mastoid
air cells are clear. The globes are intact.
IMPRESSION: No acute intracranial findings or mass lesion.

## 2013-10-09 MED ORDER — OXYCODONE-ACETAMINOPHEN 10-325 MG PO TABS: ORAL_TABLET | ORAL | Status: DC

## 2013-10-09 NOTE — Progress Notes (Signed)
  Subjective:    Patient ID: Melanie Shepard, female    DOB: [birth year], 47 y.o.   MRN: [id number]  Abdominal Pain This is a new problem. The onset quality is sudden. The problem occurs intermittently. The problem has been unchanged. The pain is located in the generalized abdominal region. The pain is at a severity of 9/10. The pain is moderate. Associated symptoms include vomiting. Nothing aggravates the pain. The pain is relieved by nothing. She has tried antibiotics for the symptoms. The treatment provided no relief.  Patient was seen at [facility] on 10/04/13 for these symptoms. She is here to follow up on this visit.  No other concerns noted at this time.  She does state loose stools. Extensive notes from her last hospitalization the ER were reviewed.   Patient also has chronic pain she's not been able to keep her pain medicine down because of frequent vomiting. She does smoke she knows she needs quit  Review of Systems  Gastrointestinal: Positive for vomiting and abdominal pain.   denies cough wheezing difficulty breathing denies high fever chills relates severe abdominal pain she also relates frequent vomiting.     Objective:   Physical Exam  Lungs are clear heart regular abdomen soft extremities no edema she does have moderate to severe lower abdominal tenderness on the left side.     Assessment & Plan:  #1 chronic pain-she states that we will be her physician for prescribing these medications the previous pain medicine doctors is no longer willing or able to take care of her. She was prescribed 150 tablets today one every 4 hours as needed for pain do not exceed 5 in today she has had to use more because she often throws them up. #2 abdominal pain recent colitis recent visit to the ER with elevated white count I recommend repeat CBC may need a repeat CT scan patient does need to get back in with [facility] gastroenterology on Monday and will need a colonoscopy possibly will need other tests  including testing for gastroparesis. Significant time spent with patient [phone]

## 2013-10-10 ENCOUNTER — Telehealth: Payer: Self-pay | Admitting: Family Medicine

## 2013-10-10 ENCOUNTER — Encounter: Payer: Self-pay | Admitting: Family Medicine

## 2013-10-10 LAB — BASIC METABOLIC PANEL
Calcium: 11.2 mg/dL — ABNORMAL HIGH (ref 8.4–10.5)
Chloride: 101 mEq/L (ref 96–112)
Creat: 0.82 mg/dL (ref 0.50–1.10)
Glucose, Bld: 96 mg/dL (ref 70–99)
Potassium: 3.8 mEq/L (ref 3.5–5.3)

## 2013-10-10 LAB — HEPATIC FUNCTION PANEL
ALT: 14 U/L (ref 0–35)
Albumin: 5.4 g/dL — ABNORMAL HIGH (ref 3.5–5.2)
Alkaline Phosphatase: 107 U/L (ref 39–117)
Total Protein: 8.7 g/dL — ABNORMAL HIGH (ref 6.0–8.3)

## 2013-10-10 LAB — LIPASE: Lipase: 34 U/L (ref 0–75)

## 2013-10-10 LAB — SEDIMENTATION RATE: Sed Rate: 4 mm/hr (ref 0–22)

## 2013-10-10 NOTE — Telephone Encounter (Signed)
This patient is on long-term disability from her work. Please give her a note to cover this time. She does come here on a regular basis for following. If she has forms to fill out she needs to forward these then we will fill them out otherwise please issue a work note for the given days and have her keep her followup appointments with [place] thank you patient is unable to work due to chronic neck pain chronic abdominal discomforts and frequent vomiting thank you

## 2013-10-10 NOTE — Telephone Encounter (Signed)
Patient needs work excuse from 09/18/13 thru 10/19/13.Tried to explain to patient being out that long she would need to bring in FMLA in . She states you done it in the pass for her again tried to explain what was done in the pass cant be done now since we are on Epic and with [facility]. I told her I would send back what she ask.

## 2013-10-13 ENCOUNTER — Telehealth: Payer: Self-pay | Admitting: Gastroenterology

## 2013-10-13 ENCOUNTER — Encounter (INDEPENDENT_AMBULATORY_CARE_PROVIDER_SITE_OTHER): Payer: Self-pay

## 2013-10-13 ENCOUNTER — Other Ambulatory Visit: Payer: Self-pay | Admitting: Internal Medicine

## 2013-10-13 ENCOUNTER — Ambulatory Visit (INDEPENDENT_AMBULATORY_CARE_PROVIDER_SITE_OTHER): Payer: BC Managed Care – PPO | Admitting: Gastroenterology

## 2013-10-13 ENCOUNTER — Encounter: Payer: Self-pay | Admitting: Gastroenterology

## 2013-10-13 VITALS — BP 86/59 | HR 91 | Temp 97.6°F | Wt 145.4 lb

## 2013-10-13 DIAGNOSIS — Z800 Family history of malignant neoplasm of digestive organs: Secondary | ICD-10-CM

## 2013-10-13 DIAGNOSIS — K3189 Other diseases of stomach and duodenum: Secondary | ICD-10-CM

## 2013-10-13 DIAGNOSIS — R1013 Epigastric pain: Secondary | ICD-10-CM

## 2013-10-13 MED ORDER — PEG 3350-KCL-NA BICARB-NACL 420 G PO SOLR: 4000.0000 mL | ORAL | Status: DC

## 2013-10-13 NOTE — Progress Notes (Signed)
Referring Provider: Babs Sciara, MD Primary Care Physician:  Lilyan Punt, MD Primary GI: Dr. Jena Gauss   Chief Complaint  Patient presents with  . Follow-up    HPI:   Melanie Shepard presents today in hospital follow-up from early October 2014. At that time, she was admitted with intractable nausea and vomiting. CT showed diffuse wall thickening involving the stomach, jejunum, and colon. Stool studies unable to be obtained due to lack of diarrhea while inpatient. Repeat stool studies ordered per Dr. Gerda Diss, but patient has been unable to collect samples.  Nausea persisting since hospitalization. Notes epigastric discomfort mainly but also extends to entire abdomen. Seems to be constant. Not worsened with eating/drinking. Presented to the ED on Oct 25th. Finished round of Cipro and Flagyl from inpatient. Had fecal incontinence while on antibiotics. No BM in 5 days. She does note decreased oral intake due to nausea. Eating jello. No hematochezia. No melena. States stool caliber is "skinny". No fever or chills. No NSAIDs, aspirin powders. Alternating Phenergan and Zofran. Protonix each morning. No GERD, dysphagia.   Past Medical History  Diagnosis Date  . Post concussion syndrome 1999  . 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 Cervical pain (neck)     chronic  . Hypothyroidism   . Anxiety   . Arthritis     right hip    Past Surgical History  Procedure Laterality Date  . Cervix surgery  10/11  . Cardiovascular stress test  03/12    normal  . Tubal ligation Bilateral   . Knee surgery Left 12/89  . Back surgery      cervical fusion  . Abdominal hysterectomy    . Cardiac catheterization  2014    mild CAD, no significant stenosis    Current Outpatient Prescriptions  Medication Sig Dispense Refill  . ALPRAZolam (XANAX) 1 MG tablet Take 1 tablet (1 mg total) by mouth 2 (two) times daily.  60 tablet  3  . aspirin 81 MG tablet Take 1 tablet (81 mg total) by mouth daily.      .  baclofen (LIORESAL) 10 MG tablet Take 10 mg by mouth 3 (three) times daily.      . Cholecalciferol (VITAMIN D3) 5000 UNITS CAPS Take 1 tablet by mouth daily.      Marland Kitchen levothyroxine (SYNTHROID, LEVOTHROID) 125 MCG tablet Take 1 tablet (125 mcg total) by mouth daily before breakfast.  30 tablet  5  . metoprolol succinate (TOPROL-XL) 25 MG 24 hr tablet Take 25 mg by mouth daily.      . ondansetron (ZOFRAN ODT) 4 MG disintegrating tablet Take 1 tablet (4 mg total) by mouth every 8 (eight) hours as needed for nausea.  20 tablet  0  . oxyCODONE-acetaminophen (PERCOCET) 10-325 MG per tablet One q 4 hours prn , no greater than 5 per day, may fill Oct 30 ( patient vomited multiple tablets past few days  150 tablet  0  . pantoprazole (PROTONIX) 40 MG tablet       . promethazine (PHENERGAN) 25 MG tablet Take 1 tablet (25 mg total) by mouth every 6 (six) hours as needed for nausea.  15 tablet  0  . traZODone (DESYREL) 100 MG tablet Take 1 tablet (100 mg total) by mouth at bedtime.  30 tablet  3   No current facility-administered medications for this visit.    Allergies as of 10/13/2013 - Review Complete 10/13/2013  Allergen Reaction Noted  . Adhesive [tape]  07/08/2013  . Cymbalta [duloxetine  hcl] Other (See Comments) 02/22/2013  . Ivp dye [iodinated diagnostic agents]  07/27/2013  . Other  07/08/2013    Family History  Problem Relation Age of Onset  . Coronary artery disease Mother 68    MI  . Colon cancer Mother     53s    History   Social History  . Marital Status: Married    Spouse Name: N/A    Number of Children: 3  . Years of Education: N/A   Occupational History  .  [employer]   Social History Main Topics  . Smoking status: Current Every Day Smoker -- 1.00 packs/day for 30 years    Types: Cigarettes  . Smokeless tobacco: None  . Alcohol Use: No  . Drug Use: No  . Sexual Activity: Yes    Birth Control/ Protection: Surgical   Other Topics Concern  . None   Social  History Narrative  . None    Review of Systems: As mentioned in HPI.  Physical Exam: BP 86/59  Pulse 91  Temp(Src) 97.6 F (36.4 C) (Oral)  Wt 145 lb 6.4 oz (65.953 kg) General:   Alert and oriented. No distress noted. Pleasant and cooperative.  Head:  Normocephalic and atraumatic. Eyes:  Conjuctiva clear without scleral icterus. Heart:  S1, S2 present without murmurs, rubs, or gallops. Regular rate and rhythm. Abdomen:  +BS, soft, mild TTP diffusely but no peritoneal signs, rebound or guarding.  Msk:  Symmetrical without gross deformities. Normal posture. Extremities:  Without edema. Neurologic:  Alert and  oriented x4;  grossly normal neurologically. Skin:  Intact without significant lesions or rashes. Psych:  Alert and cooperative. Normal mood and affect.  Lab Results  Component Value Date   WBC 19.0* 10/09/2013   HGB 17.6* 10/09/2013   HCT 49.9* 10/09/2013   MCV 94.9 10/09/2013   PLT 364 10/09/2013   Lab Results  Component Value Date   LIPASE 34 10/09/2013   Lab Results  Component Value Date   ALT 14 10/09/2013   AST 13 10/09/2013   ALKPHOS 107 10/09/2013   BILITOT 0.8 10/09/2013   Lab Results  Component Value Date   CREATININE 0.82 10/09/2013   BUN 7 10/09/2013   NA 138 10/09/2013   K 3.8 10/09/2013   CL 101 10/09/2013   CO2 23 10/09/2013   Sep 11, 2013 CT abd/pelvis without contrast:  IMPRESSION:  There is no dilatation or caliber transition to suggest  obstruction. There is moderately severe wall thickening of the  cecum, ascending colon, transverse colon, and descending colon,  with evidence of fatty deposition in the wall that can be seen with  a more chronic inflammatory process. There is left significant  wall thickening of the sigmoid colon. There is also multi focal  scattered wall thickening of the jejunum. Finally, there is mild  gastric wall thickening. The findings can be seen with infectious  gastroenteritis. Inflammatory bowel disease is  another  consideration that should be considered.  Small volume free fluid in the pelvis within physiologic limits of  normal. Uterus appears to be absent. Ovary is not specifically  identified.

## 2013-10-13 NOTE — Telephone Encounter (Signed)
Spoke with Dr. Gerda Diss this afternoon regarding leukocytosis. She has had mild bumps in white count in the recent past, but CBC on Oct 25th and 30th showed WBC 19. This is definitely in contrast to prior blood work.   Although her presentation does not appear acute and clinically she is stable, let's have her repeat a CBC now. She will be having a colonoscopy/EGD on Nov 5th with Dr. Jena Gauss. I want to make sure leukocytosis is improving.   I had originally told Dr. Gerda Diss we would just proceed with the colonoscopy, but I am copying him on this as well so he knows we are updating just the CBC now.

## 2013-10-13 NOTE — Patient Instructions (Signed)
We have scheduled you for a colonoscopy with upper endoscopy in the near future. This will be done by Dr. Jena Gauss.  Continue to take Protonix each morning, 30 minutes before breakfast.

## 2013-10-14 NOTE — Assessment & Plan Note (Addendum)
No prior colonoscopy, recently inpatient due to gastroenteritis-type presentation with CT noting diffuse wall thickening of the stomach, jejunum, and colon. Mother diagnosed in her 78s. No hematochezia noted; diarrhea resolved since admission.   Proceed with TCS with Dr. Jena Gauss in near future: the risks, benefits, and alternatives have been discussed with the patient in detail. The patient states understanding and desires to proceed.

## 2013-10-14 NOTE — Addendum Note (Signed)
Addended by: Metro Kung on: 10/14/2013 11:52 AM   Modules accepted: Orders

## 2013-10-14 NOTE — Telephone Encounter (Signed)
I contacted patient. She is unable to get blood work done today. Starting prep soon. Abdominal pain is unchanged from admission to hospital. No fever, chills. No urinary symptoms. Will hold off on repeat CBC now. Recheck per Dr. Fletcher Anon plan as outlined next week. Colonoscopy and EGD with Dr. Jena Gauss 11/5 as planned.

## 2013-10-14 NOTE — Assessment & Plan Note (Signed)
47 year old female with several month history of mainly epigastric discomfort but also extending diffusely to entire abdomen, noted as constant without any aggravating or relieving factors. Associated nausea. Recently inpatient with CT noting diffuse wall thickening of stomach, jejunum, and colon. While inpatient, plans were for outpatient colonoscopy due to colitis on CT and family history of colon cancer; however, with her continued epigastric discomfort and persistent nausea, will add EGD at time of TCS. No overt signs of GI bleeding. As of note, mild leukocytosis has been noted on several different occasions; however, most recently WBC 19. I attempted to have this redrawn prior to colonoscopy, but she was unable to present to the lab. No concerning signs on physical exam, and she has not had any change or worsening of her symptoms since hospitalization. Question part of leukocytosis may be reactive, but I doubt this would explain such a bump. As she is afebrile and without significant change, will proceed with TCS/EGD in near future.   Proceed with upper endoscopy in the near future with Dr. Jena Gauss. The risks, benefits, and alternatives have been discussed in detail with patient. They have stated understanding and desire to proceed.  Continue Protonix daily

## 2013-10-15 ENCOUNTER — Ambulatory Visit (HOSPITAL_COMMUNITY)
Admission: RE | Admit: 2013-10-15 | Discharge: 2013-10-15 | Disposition: A | Discharge status: Home / Self Care | Payer: BC Managed Care – PPO | Source: Ambulatory Visit | Attending: Internal Medicine | Admitting: Internal Medicine

## 2013-10-15 ENCOUNTER — Encounter (HOSPITAL_COMMUNITY): Payer: Self-pay | Admitting: *Deleted

## 2013-10-15 ENCOUNTER — Encounter (HOSPITAL_COMMUNITY)
Admission: RE | Disposition: A | Discharge status: Home / Self Care | Payer: Self-pay | Source: Ambulatory Visit | Attending: Internal Medicine

## 2013-10-15 DIAGNOSIS — K2100 Gastro-esophageal reflux disease with esophagitis, without bleeding: Secondary | ICD-10-CM | POA: Insufficient documentation

## 2013-10-15 DIAGNOSIS — K3189 Other diseases of stomach and duodenum: Secondary | ICD-10-CM | POA: Insufficient documentation

## 2013-10-15 DIAGNOSIS — D126 Benign neoplasm of colon, unspecified: Secondary | ICD-10-CM

## 2013-10-15 DIAGNOSIS — R1013 Epigastric pain: Secondary | ICD-10-CM

## 2013-10-15 DIAGNOSIS — Z800 Family history of malignant neoplasm of digestive organs: Secondary | ICD-10-CM

## 2013-10-15 DIAGNOSIS — R933 Abnormal findings on diagnostic imaging of other parts of digestive tract: Secondary | ICD-10-CM

## 2013-10-15 DIAGNOSIS — R112 Nausea with vomiting, unspecified: Secondary | ICD-10-CM

## 2013-10-15 HISTORY — PX: COLONOSCOPY WITH ESOPHAGOGASTRODUODENOSCOPY (EGD): SHX5779

## 2013-10-15 LAB — CBC
Platelets: 264 10*3/uL (ref 150–400)
RBC: 4.36 MIL/uL (ref 3.87–5.11)
WBC: 10.7 10*3/uL — ABNORMAL HIGH (ref 4.0–10.5)

## 2013-10-15 SURGERY — COLONOSCOPY WITH ESOPHAGOGASTRODUODENOSCOPY (EGD)
Anesthesia: Moderate Sedation

## 2013-10-15 MED ORDER — MIDAZOLAM HCL 5 MG/5ML IJ SOLN
INTRAMUSCULAR | Status: DC | PRN
  Administered 2013-10-15: 2 mg via INTRAVENOUS
  Administered 2013-10-15: 1 mg via INTRAVENOUS
  Administered 2013-10-15 (×2): 2 mg via INTRAVENOUS
  Administered 2013-10-15: 1 mg via INTRAVENOUS

## 2013-10-15 MED ORDER — MIDAZOLAM HCL 5 MG/5ML IJ SOLN
INTRAMUSCULAR | Status: AC
  Filled 2013-10-15: qty 10

## 2013-10-15 MED ORDER — BUTAMBEN-TETRACAINE-BENZOCAINE 2-2-14 % EX AERO
INHALATION_SPRAY | CUTANEOUS | Status: DC | PRN
  Administered 2013-10-15: 2 via TOPICAL

## 2013-10-15 MED ORDER — STERILE WATER FOR IRRIGATION IR SOLN
Status: DC | PRN
  Administered 2013-10-15: 14:00:00

## 2013-10-15 MED ORDER — PROMETHAZINE HCL 25 MG/ML IJ SOLN
INTRAMUSCULAR | Status: AC
  Administered 2013-10-15: 25 mg
  Filled 2013-10-15: qty 1

## 2013-10-15 MED ORDER — SODIUM CHLORIDE 0.9 % IV SOLN
INTRAVENOUS | Status: DC
  Administered 2013-10-15: 12:00:00 via INTRAVENOUS

## 2013-10-15 MED ORDER — MEPERIDINE HCL 100 MG/ML IJ SOLN
INTRAMUSCULAR | Status: AC
  Filled 2013-10-15: qty 2

## 2013-10-15 MED ORDER — PROMETHAZINE HCL 25 MG/ML IJ SOLN: 25.0000 mg | Freq: Once | INTRAMUSCULAR | Status: DC

## 2013-10-15 MED ORDER — ONDANSETRON HCL 4 MG/2ML IJ SOLN
INTRAMUSCULAR | Status: DC | PRN
  Administered 2013-10-15: 4 mg via INTRAVENOUS

## 2013-10-15 MED ORDER — SODIUM CHLORIDE 0.9 % IJ SOLN
INTRAMUSCULAR | Status: AC
  Filled 2013-10-15: qty 10

## 2013-10-15 MED ORDER — ONDANSETRON HCL 4 MG/2ML IJ SOLN
INTRAMUSCULAR | Status: AC
  Filled 2013-10-15: qty 2

## 2013-10-15 MED ORDER — MEPERIDINE HCL 100 MG/ML IJ SOLN
INTRAMUSCULAR | Status: DC | PRN
  Administered 2013-10-15: 50 mg via INTRAVENOUS
  Administered 2013-10-15 (×3): 25 mg via INTRAVENOUS

## 2013-10-15 NOTE — Op Note (Signed)
[facility] [address] [place], [address]   ENDOSCOPY PROCEDURE REPORT  PATIENT: Melanie Shepard, Melanie Shepard  MR#: [id number] BIRTHDATE: February 13, 1966 , 47  yrs. old GENDER: Female ENDOSCOPIST: R.  Roetta Sessions, MD FACP FACG REFERRED BY:  Lilyan Punt, M.D. PROCEDURE DATE:  10/15/2013 PROCEDURE:      EGD-diagnostic  INDICATIONS:     Persisting nausea/dyspepsia/vomiting  INFORMED CONSENT:   The risks, benefits, limitations, alternatives and imponderables have been discussed.  The potential for biopsy, esophogeal dilation, etc. have also been reviewed.  Questions have been answered.  All parties agreeable.  Please see the history and physical in the medical record for more information.  MEDICATIONS:    Versed 6 mg IV in 100 mg Demerol IV in divided doses. Zofran 4 mg IV. Cetacaine spray.  DESCRIPTION OF PROCEDURE:   The EG-[id number] ([id number])  endoscope was introduced through the mouth and advanced to the second portion of the duodenum without difficulty or limitations.  The mucosal surfaces were surveyed very carefully during advancement of the scope and upon withdrawal.  Retroflexion view of the proximal stomach and esophagogastric junction was performed.      FINDINGS: circumferential distal esophageal erosions with one long linear erosion coming up 2 cm above the GE junction. No Barrett's esophagus. stomach empty.  Normal gastric mucosa. Patent pylorus. Normal first, second and third portion of the duodenum  THERAPEUTIC / DIAGNOSTIC MANEUVERS PERFORMED:  None   COMPLICATIONS:  None  IMPRESSION:  Erosive reflux esophagitis.  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  RECOMMENDATIONS:  Stop protonix;  Begin Dexilant 60 mg daily-patient is to go by my office for free samples.; we'll also consider a short course of low-dose Reglan (i.e. 3-5 days).  See colonoscopy report.    _______________________________ R. Roetta Sessions, MD FACP [facility] eSigned:  R. Roetta Sessions, MD FACP [facility] 10/15/2013 2:01 PM     CC:  PATIENT NAME:  Meeya, Goldin MR#: [id number]

## 2013-10-15 NOTE — Interval H&P Note (Signed)
History and Physical Interval Note:  10/15/2013 1:28 PM  Pecolia Desrosiers  has presented today for surgery, with the diagnosis of DYSPEPSIA AND FAMILY HISTORY OF COLON CANCER  The various methods of treatment have been discussed with the patient and family. After consideration of risks, benefits and other options for treatment, the patient has consented to  Procedure(s) with comments: COLONOSCOPY WITH ESOPHAGOGASTRODUODENOSCOPY (EGD) (N/A) - 12:00 as a surgical intervention .  The patient's history has been reviewed, patient examined, no change in status, stable for surgery.  I have reviewed the patient's chart and labs.  Questions were answered to the patient's satisfaction.    Patient here for EGD and colonoscopy for dyspepsia, abnormal colon on CT, positive family history of colon cancer.  The risks, benefits, limitations, imponderables and alternatives regarding both EGD and colonoscopy have been reviewed with the patient. Questions have been answered. All parties agreeable.   Eula Listen

## 2013-10-15 NOTE — Progress Notes (Signed)
I attempted to have patient repeat CBC on 11/4. She was unable to do this. Spoke with Shawna Orleans, RN in Endo, and asked that we recheck a CBC stat while she is there. Again, her symptoms are unchanged from prior hospital presentation and no acute findings on physical exam when seen in the office on Monday. She is afebrile.

## 2013-10-15 NOTE — H&P (View-Only) (Signed)
 Referring Provider: Luking, Scott A, MD Primary Care Physician:  LUKING,SCOTT, MD Primary GI: Dr. Rourk   Chief Complaint  Patient presents with  . Follow-up    HPI:   Melanie Shepard presents today in hospital follow-up from early October 2014. At that time, she was admitted with intractable nausea and vomiting. CT showed diffuse wall thickening involving the stomach, jejunum, and colon. Stool studies unable to be obtained due to lack of diarrhea while inpatient. Repeat stool studies ordered per Dr. Luking, but patient has been unable to collect samples.  Nausea persisting since hospitalization. Notes epigastric discomfort mainly but also extends to entire abdomen. Seems to be constant. Not worsened with eating/drinking. Presented to the ED on Oct 25th. Finished round of Cipro and Flagyl from inpatient. Had fecal incontinence while on antibiotics. No BM in 5 days. She does note decreased oral intake due to nausea. Eating jello. No hematochezia. No melena. States stool caliber is "skinny". No fever or chills. No NSAIDs, aspirin powders. Alternating Phenergan and Zofran. Protonix each morning. No GERD, dysphagia.   Past Medical History  Diagnosis Date  . Post concussion syndrome 1999  . 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 Cervical pain (neck)     chronic  . Hypothyroidism   . Anxiety   . Arthritis     right hip    Past Surgical History  Procedure Laterality Date  . Cervix surgery  10/11  . Cardiovascular stress test  03/12    normal  . Tubal ligation Bilateral   . Knee surgery Left 12/89  . Back surgery      cervical fusion  . Abdominal hysterectomy    . Cardiac catheterization  2014    mild CAD, no significant stenosis    Current Outpatient Prescriptions  Medication Sig Dispense Refill  . ALPRAZolam (XANAX) 1 MG tablet Take 1 tablet (1 mg total) by mouth 2 (two) times daily.  60 tablet  3  . aspirin 81 MG tablet Take 1 tablet (81 mg total) by mouth daily.      .  baclofen (LIORESAL) 10 MG tablet Take 10 mg by mouth 3 (three) times daily.      . Cholecalciferol (VITAMIN D3) 5000 UNITS CAPS Take 1 tablet by mouth daily.      . levothyroxine (SYNTHROID, LEVOTHROID) 125 MCG tablet Take 1 tablet (125 mcg total) by mouth daily before breakfast.  30 tablet  5  . metoprolol succinate (TOPROL-XL) 25 MG 24 hr tablet Take 25 mg by mouth daily.      . ondansetron (ZOFRAN ODT) 4 MG disintegrating tablet Take 1 tablet (4 mg total) by mouth every 8 (eight) hours as needed for nausea.  20 tablet  0  . oxyCODONE-acetaminophen (PERCOCET) 10-325 MG per tablet One q 4 hours prn , no greater than 5 per day, may fill Oct 30 ( patient vomited multiple tablets past few days  150 tablet  0  . pantoprazole (PROTONIX) 40 MG tablet       . promethazine (PHENERGAN) 25 MG tablet Take 1 tablet (25 mg total) by mouth every 6 (six) hours as needed for nausea.  15 tablet  0  . traZODone (DESYREL) 100 MG tablet Take 1 tablet (100 mg total) by mouth at bedtime.  30 tablet  3   No current facility-administered medications for this visit.    Allergies as of 10/13/2013 - Review Complete 10/13/2013  Allergen Reaction Noted  . Adhesive [tape]  07/08/2013  . Cymbalta [duloxetine   hcl] Other (See Comments) 02/22/2013  . Ivp dye [iodinated diagnostic agents]  07/27/2013  . Other  07/08/2013    Family History  Problem Relation Age of Onset  . Coronary artery disease Mother 60    MI  . Colon cancer Mother     50s    History   Social History  . Marital Status: Married    Spouse Name: N/A    Number of Children: 3  . Years of Education: N/A   Occupational History  .  [employer]   Social History Main Topics  . Smoking status: Current Every Day Smoker -- 1.00 packs/day for 30 years    Types: Cigarettes  . Smokeless tobacco: None  . Alcohol Use: No  . Drug Use: No  . Sexual Activity: Yes    Birth Control/ Protection: Surgical   Other Topics Concern  . None   Social  History Narrative  . None    Review of Systems: As mentioned in HPI.  Physical Exam: BP 86/59  Pulse 91  Temp(Src) 97.6 F (36.4 C) (Oral)  Wt 145 lb 6.4 oz (65.953 kg) General:   Alert and oriented. No distress noted. Pleasant and cooperative.  Head:  Normocephalic and atraumatic. Eyes:  Conjuctiva clear without scleral icterus. Heart:  S1, S2 present without murmurs, rubs, or gallops. Regular rate and rhythm. Abdomen:  +BS, soft, mild TTP diffusely but no peritoneal signs, rebound or guarding.  Msk:  Symmetrical without gross deformities. Normal posture. Extremities:  Without edema. Neurologic:  Alert and  oriented x4;  grossly normal neurologically. Skin:  Intact without significant lesions or rashes. Psych:  Alert and cooperative. Normal mood and affect.  Lab Results  Component Value Date   WBC 19.0* 10/09/2013   HGB 17.6* 10/09/2013   HCT 49.9* 10/09/2013   MCV 94.9 10/09/2013   PLT 364 10/09/2013   Lab Results  Component Value Date   LIPASE 34 10/09/2013   Lab Results  Component Value Date   ALT 14 10/09/2013   AST 13 10/09/2013   ALKPHOS 107 10/09/2013   BILITOT 0.8 10/09/2013   Lab Results  Component Value Date   CREATININE 0.82 10/09/2013   BUN 7 10/09/2013   NA 138 10/09/2013   K 3.8 10/09/2013   CL 101 10/09/2013   CO2 23 10/09/2013   Sep 11, 2013 CT abd/pelvis without contrast:  IMPRESSION:  There is no dilatation or caliber transition to suggest  obstruction. There is moderately severe wall thickening of the  cecum, ascending colon, transverse colon, and descending colon,  with evidence of fatty deposition in the wall that can be seen with  a more chronic inflammatory process. There is left significant  wall thickening of the sigmoid colon. There is also multi focal  scattered wall thickening of the jejunum. Finally, there is mild  gastric wall thickening. The findings can be seen with infectious  gastroenteritis. Inflammatory bowel disease is  another  consideration that should be considered.  Small volume free fluid in the pelvis within physiologic limits of  normal. Uterus appears to be absent. Ovary is not specifically  identified.   

## 2013-10-15 NOTE — Op Note (Signed)
[facility] [address] [place], [address]   COLONOSCOPY PROCEDURE REPORT  PATIENT: Melanie Shepard, Melanie Shepard  MR#:         [id number] BIRTHDATE: 1966-11-07 , 47  yrs. old GENDER: Female ENDOSCOPIST: R.  Roetta Sessions, MD FACP FACG REFERRED BY:  Lilyan Punt, M.D. PROCEDURE DATE:  10/15/2013 PROCEDURE:     Ileocolonoscopy biopsy and snare polypectomy  INDICATIONS: Abnormal colon  on CT; positive family history of colon cancer. Recent leukocytosis essentially resolved with the latest total white count coming down to the10,000 range.  INFORMED CONSENT:  The risks, benefits, alternatives and imponderables including but not limited to bleeding, perforation as well as the possibility of a missed lesion have been reviewed.  The potential for biopsy, lesion removal, etc. have also been discussed.  Questions have been answered.  All parties agreeable. Please see the history and physical in the medical record for more information.  MEDICATIONS: Versed 8 mg IV and Demerol 125 mg IV in divided doses. Phenergan 25 mg IV. Zofran 4 mg IV.  DESCRIPTION OF PROCEDURE:  After a digital rectal exam was performed, the EC-[id number] ([id number])  colonoscope was advanced from the anus through the rectum and colon to the area of the cecum, ileocecal valve and appendiceal orifice.  The cecum was deeply intubated.  These structures were well-seen and photographed for the record.  From the level of the cecum and ileocecal valve, the scope was slowly and cautiously withdrawn.  The mucosal surfaces were carefully surveyed utilizing scope tip deflection to facilitate fold flattening as needed.  The scope was pulled down into the rectum where a thorough examination  was performed.    FINDINGS:  Adequate preparation. Anal papilla and internal hemorrhoids; otherwise, normal rectum.  Rectal vault small - unable to retroflex-however, was able to see the rectal mucosa very well on-face.   The patient had  (2) 4 mm  polyps at the splenic flexure and (2) adjacent diminutive polyps. There was (1) 8 mm serrated polyp in the mid sigmoid segment; the remainder of colonic mucosa appeared normal. The distal 10 centimeters of terminal ileal mucosa also appeared normal.  THERAPEUTIC / DIAGNOSTIC MANEUVERS PERFORMED:  The above-mentioned polyps at the splenic flexure were cold snared and or biopsy removed. The sigmoid polyp was hot snare removed  COMPLICATIONS: None  CECAL WITHDRAWAL TIME:  17 minutes  IMPRESSION:  Colonic polyps-removed as described above. I suspect the patient had a recent enteric infection which was responsible for the CT findings  RECOMMENDATIONS: Followup on pathology. See EGD report. In addition to starting Dexilant, we'll go ahead and prescribe a low dose of Reglan 5 mg a.c. and at bedtime. Dispensing only 20 tablets for 5 days worth of therapy. Discussed the risks and benefits of this approach with the patient's husband at length. Specifically, admonished about the potential for a dystonic reactions. If any side effects observed, patient is to stop the medication.  .   _______________________________ eSigned:  R. Roetta Sessions, MD FACP [facility] 10/15/2013 2:49 PM   CC:    PATIENT NAME:  Melanie Shepard, Melanie Shepard MR#: [id number]

## 2013-10-15 NOTE — Progress Notes (Signed)
Repeat CBC on file, just resulted. WBC significantly improved, almost normalized.   Lab Results  Component Value Date   WBC 10.7* 10/15/2013   HGB 14.4 10/15/2013   HCT 41.5 10/15/2013   MCV 95.2 10/15/2013   PLT 264 10/15/2013

## 2013-10-20 NOTE — Progress Notes (Signed)
cc'd to pcp 

## 2013-10-21 ENCOUNTER — Encounter (HOSPITAL_COMMUNITY): Payer: Self-pay | Admitting: Internal Medicine

## 2013-10-23 ENCOUNTER — Encounter: Payer: Self-pay | Admitting: Family Medicine

## 2013-10-23 ENCOUNTER — Telehealth: Payer: Self-pay | Admitting: Family Medicine

## 2013-10-23 ENCOUNTER — Ambulatory Visit (INDEPENDENT_AMBULATORY_CARE_PROVIDER_SITE_OTHER): Payer: BC Managed Care – PPO | Admitting: Family Medicine

## 2013-10-23 VITALS — BP 110/74 | Ht 67.0 in | Wt 148.0 lb

## 2013-10-23 DIAGNOSIS — E039 Hypothyroidism, unspecified: Secondary | ICD-10-CM

## 2013-10-23 DIAGNOSIS — M7582 Other shoulder lesions, left shoulder: Secondary | ICD-10-CM

## 2013-10-23 DIAGNOSIS — M542 Cervicalgia: Secondary | ICD-10-CM

## 2013-10-23 DIAGNOSIS — G8929 Other chronic pain: Secondary | ICD-10-CM

## 2013-10-23 DIAGNOSIS — M67919 Unspecified disorder of synovium and tendon, unspecified shoulder: Secondary | ICD-10-CM

## 2013-10-23 MED ORDER — LEVOTHYROXINE SODIUM 175 MCG PO TABS: 175.0000 ug | ORAL_TABLET | Freq: Every day | ORAL | Status: DC

## 2013-10-23 NOTE — Telephone Encounter (Signed)
Patient was seen today and wanting another work excuse. She states cant get FMLA because she isnt working so she wants a work note from 11/9 until 11/21/13.

## 2013-10-23 NOTE — Telephone Encounter (Signed)
See letter, please offer to mail that to her, fax it if needed, when she needs another to let me know

## 2013-10-23 NOTE — Progress Notes (Signed)
  Subjective:    Patient ID: Melanie Shepard, female    DOB: [birth year], 47 y.o.   MRN: [id number]  HPIHere for a follow up. Had colonoscopy. Has not had bloodwork done yet that was ordered by Dr. Lorin Picket for cbc and wbc but she had cbc ordered by Dr. Kendell Bane.    Discussed disability. Needs Work excuse for October thur Dec 12th. Patient is completely disabled. She has significant pain discomfort in her neck radiates into the shoulder and into the arm hurts with certain range of motion with the arms the pain in the neck into the trapezius is constant. She has to take up to 5 pain pills a day. She cannot be on her feet long before she has to sit down and rest she also relates she cannot work with her arm above shoulder level. In addition to this she states the pain is bad enough to where she often lays down  Declined flu vaccine. The importance of flu vaccine discussed the patient does not want to get it area  She does smoke she's been counseled to quit given information to quit  Recent colonoscopy reviewed recent notes from GI review  Family history noncontributory social married smokes  Review of Systems See above she relates neck pain trapezius pain shoulder pain arm pain left side does have some breathing issues when she pushes herself no chest pain no swelling in the legs    Objective:   Physical Exam Moderate tenderness in the neck more on the left side than the right limited range of motion subjective tenderness and pain into the trapezius positive apprehension test positive weakness in the rotator cuff on the left side lungs are clear hearts regular abdomen soft       Assessment & Plan:  #1 colitis-resolved #2 COPD mild-patient strongly taught to quit smoking #3 chronic neck pain pain medication as indicated #4 probable rotator cuff strain possible tear left shoulder referral to orthopedics #5 patient is disabled. I think it would be very difficult for this patient to do any type of  full-time work. I believe that this patient would probably miss way too many days and not be maintained that type of schedule. I think the best she can do is minimal activity for only a few hours per week

## 2013-10-25 ENCOUNTER — Encounter: Payer: Self-pay | Admitting: Internal Medicine

## 2013-11-04 ENCOUNTER — Telehealth: Payer: Self-pay | Admitting: Family Medicine

## 2013-11-04 ENCOUNTER — Encounter: Payer: Self-pay | Admitting: Neurology

## 2013-11-04 ENCOUNTER — Ambulatory Visit (INDEPENDENT_AMBULATORY_CARE_PROVIDER_SITE_OTHER): Payer: BC Managed Care – PPO | Admitting: Neurology

## 2013-11-04 VITALS — BP 99/64 | HR 90 | Ht 67.5 in | Wt 145.0 lb

## 2013-11-04 DIAGNOSIS — R569 Unspecified convulsions: Secondary | ICD-10-CM

## 2013-11-04 MED ORDER — OXYCODONE-ACETAMINOPHEN 10-325 MG PO TABS: ORAL_TABLET | ORAL | Status: DC

## 2013-11-04 NOTE — Telephone Encounter (Signed)
Last filled 10/09/13

## 2013-11-04 NOTE — Telephone Encounter (Signed)
Patient may have a one-month supply of the oxycodone, #150, maximum 5 per day, may feel this on November 29.(Rules are strict)
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
## 2013-11-04 NOTE — Telephone Encounter (Signed)
Difficulty printing RX. Only one RX was printed and left up front for patient pick up. Patient notified.

## 2013-11-04 NOTE — Telephone Encounter (Signed)
Patient needs Rx for oxycodone °

## 2013-11-04 NOTE — Patient Instructions (Addendum)
Overall you are doing fairly well but I do want to suggest a few things today:   Remember to drink plenty of fluid, eat healthy meals and do not skip any meals. Try to eat protein with a every meal and eat a healthy snack such as fruit or nuts in between meals. Try to keep a regular sleep-wake schedule and try to exercise daily, particularly in the form of walking, 20-30 minutes a day, if you can.   As far as diagnostic testing:  1)We will recheck the EEG  We will hold off on starting you on a seizure medication at this time.   Per AAN guidelines you should refrain from driving for 6 months after your recent seizure.   Try taking melatonin 5 to 10mg  nightly to help you sleep better  Follow up as needed. Please call us with any interim questions, concerns, problems, updates or refill requests.   My clinical assistant and will answer any of your questions and relay your messages to me and also relay most of my messages to you.   Our phone number is [phone]. We also have an after hours call service for urgent matters and there is a physician on-call for urgent questions. For any emergencies you know to call 911 or go to the nearest emergency room

## 2013-11-17 ENCOUNTER — Telehealth: Payer: Self-pay | Admitting: Gastroenterology

## 2013-11-17 ENCOUNTER — Ambulatory Visit: Payer: Self-pay | Admitting: Gastroenterology

## 2013-11-17 NOTE — Telephone Encounter (Signed)
Pt was a no show

## 2013-11-25 ENCOUNTER — Telehealth: Payer: Self-pay | Admitting: Family Medicine

## 2013-11-25 NOTE — Telephone Encounter (Signed)
Patient states that her work is extending her disability for another year, but she needs a work excuse extended to last her until however long you think that she should be wrote for.  Patient needs this faxed Attention to Orson Ape asked for fax number, but she said we should have it on file.  Also, patients thyroid medication is not working. She said that it has caused her a lot of issues so she would like to make an appointment to come in soon to discuss this, but the next available would be christmas eve. Can we work her in before this or can we schedule for christmas eve?

## 2013-11-26 NOTE — Telephone Encounter (Signed)
May give 6 month work excuse. In 6 months we can issue the next one, keep regular follow ups at least every 3 to 4 months

## 2013-11-27 ENCOUNTER — Encounter: Payer: Self-pay | Admitting: Family Medicine

## 2013-12-02 ENCOUNTER — Telehealth: Payer: Self-pay | Admitting: Family Medicine

## 2013-12-02 MED ORDER — OXYCODONE-ACETAMINOPHEN 10-325 MG PO TABS: ORAL_TABLET | ORAL | Status: DC

## 2013-12-02 NOTE — Telephone Encounter (Signed)
Last filled 11/08/13 

## 2013-12-02 NOTE — Telephone Encounter (Signed)
Rx printed and left up front for patient pick up. Patient notified. 

## 2013-12-02 NOTE — Telephone Encounter (Signed)
May have refill. Prescription may be filled on 12/07/2013

## 2013-12-02 NOTE — Telephone Encounter (Signed)
oxyCODONE-acetaminophen (PERCOCET) 10-325 MG per tablet  Pt would like to go ahead an call this in for pick up please

## 2013-12-08 ENCOUNTER — Other Ambulatory Visit: Payer: BC Managed Care – PPO | Admitting: Radiology

## 2013-12-08 ENCOUNTER — Other Ambulatory Visit: Payer: Self-pay | Admitting: Family Medicine

## 2013-12-08 NOTE — Telephone Encounter (Signed)
Ok times one 

## 2013-12-16 ENCOUNTER — Other Ambulatory Visit: Payer: BC Managed Care – PPO | Admitting: Radiology

## 2014-01-05 ENCOUNTER — Telehealth: Payer: Self-pay | Admitting: Family Medicine

## 2014-01-05 NOTE — Telephone Encounter (Signed)
Patient needs refill on oxycodone 10mg. °

## 2014-01-05 NOTE — Telephone Encounter (Signed)
Refill on her prescription. Oxycodone 10 mg/325 mg, 150, 1 every 4 hours as needed no greater than 5 per day, she will be due a followup regarding chronic pain by the time this prescription is ending. Thank you.

## 2014-01-05 NOTE — Telephone Encounter (Signed)
Last office visit 10-23-13

## 2014-01-06 ENCOUNTER — Other Ambulatory Visit: Payer: Self-pay | Admitting: *Deleted

## 2014-01-06 MED ORDER — OXYCODONE-ACETAMINOPHEN 10-325 MG PO TABS: ORAL_TABLET | ORAL | Status: DC

## 2014-01-06 NOTE — Telephone Encounter (Signed)
Left message on voicemail that rx is ready for pickup.

## 2014-01-13 ENCOUNTER — Ambulatory Visit (INDEPENDENT_AMBULATORY_CARE_PROVIDER_SITE_OTHER): Payer: BC Managed Care – PPO | Admitting: Family Medicine

## 2014-01-13 ENCOUNTER — Encounter: Payer: Self-pay | Admitting: Family Medicine

## 2014-01-13 VITALS — BP 102/64 | Ht 67.0 in | Wt 154.0 lb

## 2014-01-13 DIAGNOSIS — E782 Mixed hyperlipidemia: Secondary | ICD-10-CM | POA: Insufficient documentation

## 2014-01-13 DIAGNOSIS — E039 Hypothyroidism, unspecified: Secondary | ICD-10-CM

## 2014-01-13 DIAGNOSIS — E559 Vitamin D deficiency, unspecified: Secondary | ICD-10-CM | POA: Insufficient documentation

## 2014-01-13 DIAGNOSIS — E785 Hyperlipidemia, unspecified: Secondary | ICD-10-CM

## 2014-01-13 DIAGNOSIS — G894 Chronic pain syndrome: Secondary | ICD-10-CM

## 2014-01-13 MED ORDER — OXYCODONE-ACETAMINOPHEN 10-325 MG PO TABS: ORAL_TABLET | ORAL | Status: DC

## 2014-01-13 MED ORDER — CHLORZOXAZONE 500 MG PO TABS: 500.0000 mg | ORAL_TABLET | Freq: Four times a day (QID) | ORAL | Status: DC | PRN

## 2014-01-13 MED ORDER — LEVOTHYROXINE SODIUM 100 MCG PO TABS: 100.0000 ug | ORAL_TABLET | Freq: Every day | ORAL | Status: DC

## 2014-01-13 NOTE — Progress Notes (Signed)
   Subjective:    Patient ID: Melanie Shepard, female    DOB: [birth year], 48 y.o.   MRN: [id number]  HPIMed check up.   Concerns about thyroid med. Stopped taking thyroid med about 1 month ago. Sleeping better with out med. Heart racing off and on since stopping the thyroid med.   Right hip pain and back pain. Has had MRI on back and xray on hip. Requesting a rx for a muscle relaxer.  Not able to do much, worse with long sitting, c/o back pain with standing, This patient was seen today for chronic pain  The medication list was reviewed and updated.  Discussion was held with the patient regarding compliance with pain medication. The patient was advised the importance of maintaining medication and not using illegal substances with these. The patient was educated that we can provide 3 monthly scripts for their medication, it is their responsibility to follow the instructions. Discussion was held with the patient to make sure they're not having significant side effects. Patient is aware that pain medications are meant to minimize the severity of the pain to allow their pain levels to improve to allow for better function. They are aware of that pain medications cannot totally remove their pain.     Review of Systems  Constitutional: Negative for activity change, appetite change and fatigue.  Gastrointestinal: Negative for abdominal pain.  Neurological: Negative for headaches.  Psychiatric/Behavioral: Negative for behavioral problems.       Objective:   Physical Exam  Vitals reviewed. Constitutional: She appears well-nourished. No distress.  HENT:  Head: Normocephalic.  Cardiovascular: Normal rate, regular rhythm and normal heart sounds.   No murmur heard. Pulmonary/Chest: Effort normal and breath sounds normal.  Musculoskeletal: She exhibits no edema.  Lymphadenopathy:    She has no cervical adenopathy.  Neurological: She is alert.  Psychiatric: Her behavior is normal.            Assessment & Plan:  Palpitations-I. feel this probably related to that she has not been taking her thyroid medicine her heart sounds great today no signs of angina. No testing necessary. Thyroid- TSH , levothyroxine-reinitiate today. 100 mcg daily. Check TSH in 6 weeks' time. If any problems she is notify us. Chronic neck/back pain-additional prescriptions given to the patient. She should followup in 3 months time.  Patient is disabled I do not believe she will ever be able to work. She has constant pain even with sitting for any length of time walking standing her husband has to drive her around. She is unable to do much of her own housework at all.  Back strain-muscle relaxers on an infrequent basis with an absolute necessary cautioned drowsiness stretching exercises shown.  Vitamin D deficiency check vitamin D level patient is taking supplements Hyperlipidemia check lipid profile await results Patient encouraged to quit smoking.

## 2014-01-14 ENCOUNTER — Encounter: Payer: Self-pay | Admitting: Family Medicine

## 2014-01-14 LAB — T4, FREE: Free T4: 0.46 ng/dL — ABNORMAL LOW (ref 0.80–1.80)

## 2014-01-14 LAB — TSH: TSH: 116.094 u[IU]/mL — ABNORMAL HIGH (ref 0.350–4.500)

## 2014-01-19 DIAGNOSIS — Z0289 Encounter for other administrative examinations: Secondary | ICD-10-CM

## 2014-02-03 ENCOUNTER — Other Ambulatory Visit: Payer: Self-pay | Admitting: Family Medicine

## 2014-02-03 NOTE — Telephone Encounter (Signed)
May have this +3 additional refills 

## 2014-02-17 ENCOUNTER — Encounter: Payer: Self-pay | Admitting: Family Medicine

## 2014-02-17 ENCOUNTER — Ambulatory Visit (INDEPENDENT_AMBULATORY_CARE_PROVIDER_SITE_OTHER): Payer: BC Managed Care – PPO | Admitting: Family Medicine

## 2014-02-17 VITALS — BP 122/82 | Ht 67.0 in | Wt 153.0 lb

## 2014-02-17 DIAGNOSIS — E039 Hypothyroidism, unspecified: Secondary | ICD-10-CM

## 2014-02-17 MED ORDER — LEVOTHYROXINE SODIUM 50 MCG PO TABS: 50.0000 ug | ORAL_TABLET | Freq: Every day | ORAL | Status: DC

## 2014-02-17 NOTE — Progress Notes (Signed)
   Subjective:    Patient ID: Melanie Shepard, female    DOB: January 12, 1966, 48 y.o.   MRN: [id number]  HPI Patient is here today to discuss her thyroid medication. She would like to try a different one d/t side effects. She read the side effect panel and said that she was having most of the side effects that was listed on the medicine panel. When she takes it, her legs/bones hurt and she cannot sleep.she relates thyroid med causing severe troubles No energy when she doesn't take it but can sleep  if takes it c/o can't sleep and aches, can go 3 days without sleep due to that  When she doesn't take it, she has tachycardia and lack of energy.   She said she has been battling these side effects for 2 years and would like to know if there is a different medication to take.    Review of Systems  Constitutional: Negative for activity change, appetite change and fatigue.  HENT: Negative for congestion.   Respiratory: Negative for cough and chest tightness.   Cardiovascular: Negative for chest pain.  Endocrine: Negative for polydipsia and polyphagia.  Genitourinary: Negative for frequency.  Neurological: Negative for weakness.  Psychiatric/Behavioral: Negative for confusion.       Objective:   Physical Exam Patient has put on some weight her lungs are clear hearts regular pulse normal      Assessment & Plan:  Endocrinology- prefers first available, I can't explain the symptoms I rec specialist input Patient is having significant side effects she attributes to levothyroxine. I am not comfortable using desiccated thyroid for treatment of hypothyroidism. I do believe that she would benefit from having endocrinologist make sure that there is no central cause of her hypothyroidism as well as discuss with her the role of thyroid medicine and hopefully find a regimen that works well for her.  I will restart her levothyroxine and 50 MCG daily she will need a higher dose but I don't feel she would take  a higher dose currently.  She will keep her regular followup for chronic pain.

## 2014-03-09 ENCOUNTER — Telehealth: Payer: Self-pay | Admitting: Family Medicine

## 2014-03-09 NOTE — Telephone Encounter (Signed)
Patient calling to check on referral to endocrinology. She would like for you to call her back tomorrow.

## 2014-03-10 NOTE — Telephone Encounter (Signed)
Called & spoke with pt, explained referral was sent today, apologized for being so behind, pt very understanding and will call me if she doesn't hear from Dr. Chalmers Cater

## 2014-03-31 ENCOUNTER — Telehealth: Payer: Self-pay | Admitting: Family Medicine

## 2014-03-31 NOTE — Telephone Encounter (Signed)
Patient wants to pick up Rx Latrena 27 2015

## 2014-03-31 NOTE — Telephone Encounter (Signed)
This patient was given 3 separate prescriptions back in early February that should last her into early May. She may have an additional refill but he may have to find out where she got her medicines in Date it appropriately based on the last day of refill. In addition to this I would recommend that this patient followup by the end of May for the additional pain medication prescriptions

## 2014-03-31 NOTE — Telephone Encounter (Signed)
Patient needs refill on oxycodone 10mg. °

## 2014-03-31 NOTE — Telephone Encounter (Signed)
Last seen 02/17/14

## 2014-04-01 NOTE — Telephone Encounter (Signed)
Pt has an appt for tomorrow.  ?

## 2014-04-02 ENCOUNTER — Encounter: Payer: Self-pay | Admitting: Family Medicine

## 2014-04-02 ENCOUNTER — Ambulatory Visit (INDEPENDENT_AMBULATORY_CARE_PROVIDER_SITE_OTHER): Payer: BC Managed Care – PPO | Admitting: Family Medicine

## 2014-04-02 VITALS — BP 112/62 | Ht 67.0 in | Wt 155.2 lb

## 2014-04-02 DIAGNOSIS — E039 Hypothyroidism, unspecified: Secondary | ICD-10-CM

## 2014-04-02 DIAGNOSIS — G8929 Other chronic pain: Secondary | ICD-10-CM

## 2014-04-02 DIAGNOSIS — M542 Cervicalgia: Secondary | ICD-10-CM

## 2014-04-02 MED ORDER — OXYCODONE-ACETAMINOPHEN 10-325 MG PO TABS: ORAL_TABLET | ORAL | Status: DC

## 2014-04-02 NOTE — Progress Notes (Signed)
   Subjective:    Patient ID: Melanie Shepard, female    DOB: 29-Jun-1966, 48 y.o.   MRN: [id number]  HPI  Patient arrives to discuss recent nausea and feels like her neck is swelling and knot in her neck getting bigger at times. Patient due to see endocrinologist in May. Patient has history hypothyroidism please see previous note.  This patient was seen today for chronic pain  The medication list was reviewed and updated.  Discussion was held with the patient regarding compliance with pain medication. The patient was advised the importance of maintaining medication and not using illegal substances with these. The patient was educated that we can provide 3 monthly scripts for their medication, it is their responsibility to follow the instructions. Discussion was held with the patient to make sure they're not having significant side effects. Patient is aware that pain medications are meant to minimize the severity of the pain to allow their pain levels to improve to allow for better function. They are aware of that pain medications cannot totally remove their pain.    Review of Systems    denies chest pain shortness breath nausea vomiting diarrhea Objective:   Physical Exam She does have what appears to be a goiter no nodules are felt lungs are clear heart is regular extremities no edema skin warm dry  Subjective chronic pain in the lower back does not radiate.       Assessment & Plan:  #1 chronic pain-her 3 prescriptions were written now. The first one to be filled on Melanie Shepard 27. She will followup toward the end of July. She states the pain medicine is helping but not as much as it use to. If she gets significantly worse with time may want to consider having her see pain management specialist but currently right now she does not feel she needs to do so she denies a medication causing her issues or problems.  #2 hypothyroidism with goiter I will discuss the case with endocrinologist for whom  she is seeing she may well need to be having additional blood work done. She states when she takes a medicine he keeps her awake but she states she feels fatigued and tired otherwise and has not been using the medicine regularly until recently for which she has been taking it several days per week I tried to encourage her that she needs to take her medicine every single day because of the hypothyroidism and she needs to see the endocrinologist for further input

## 2014-04-16 ENCOUNTER — Encounter: Payer: Self-pay | Admitting: *Deleted

## 2014-04-22 ENCOUNTER — Other Ambulatory Visit (HOSPITAL_COMMUNITY): Payer: Self-pay | Admitting: Endocrinology

## 2014-04-22 DIAGNOSIS — E049 Nontoxic goiter, unspecified: Secondary | ICD-10-CM

## 2014-04-23 ENCOUNTER — Encounter: Payer: Self-pay | Admitting: Cardiovascular Disease

## 2014-04-24 ENCOUNTER — Ambulatory Visit (INDEPENDENT_AMBULATORY_CARE_PROVIDER_SITE_OTHER): Payer: BC Managed Care – PPO | Admitting: Cardiovascular Disease

## 2014-04-24 ENCOUNTER — Encounter: Payer: Self-pay | Admitting: Cardiovascular Disease

## 2014-04-24 VITALS — BP 110/68 | HR 85 | Resp 16 | Ht 67.0 in | Wt 156.2 lb

## 2014-04-24 DIAGNOSIS — F17200 Nicotine dependence, unspecified, uncomplicated: Secondary | ICD-10-CM

## 2014-04-24 DIAGNOSIS — E785 Hyperlipidemia, unspecified: Secondary | ICD-10-CM

## 2014-04-24 DIAGNOSIS — R079 Chest pain, unspecified: Secondary | ICD-10-CM

## 2014-04-24 DIAGNOSIS — Z79899 Other long term (current) drug therapy: Secondary | ICD-10-CM

## 2014-04-24 DIAGNOSIS — R0602 Shortness of breath: Secondary | ICD-10-CM

## 2014-04-24 DIAGNOSIS — E039 Hypothyroidism, unspecified: Secondary | ICD-10-CM

## 2014-04-24 DIAGNOSIS — E782 Mixed hyperlipidemia: Secondary | ICD-10-CM

## 2014-04-24 NOTE — Assessment & Plan Note (Signed)
Smoking cessation is strongly recommended. She does not appear ready to make a commitment.

## 2014-04-24 NOTE — Assessment & Plan Note (Signed)
Her coronary atherosclerosis findings are mild, but considering her female gender and young age I think it is recommended that we treat her moderate hyperlipidemia. Target LDL at least less than 100, preferably less than 70. Will repeat a lipid profile, since her thyroid function has been so variable recently.

## 2014-04-24 NOTE — Patient Instructions (Signed)
Dr. Croitoru recommends that you schedule a follow-up appointment in: ONE YEAR   

## 2014-04-24 NOTE — Assessment & Plan Note (Signed)
Just within the last 12 months she has had a normal nuclear stress test and a cardiac catheterization that showed only mild coronary atherosclerosis, without flow-limiting lesions. Her chest discomfort seems to be primarily associated with meals and is therefore likely gastrointestinal in etiology.

## 2014-04-24 NOTE — Assessment & Plan Note (Signed)
Once her thyroid function is regulated, discuss restarting her on a low dose of beta blocker. She seemeds to benefit symptomatically from this.

## 2014-04-24 NOTE — Progress Notes (Signed)
Patient ID: Melanie Shepard, female   DOB: 1966-06-29, 48 y.o.   MRN: [id number]      Reason for office visit CAD  Melanie Shepard is a former patient of Dr. Terance Ice. I met her once before during a brief hospitalization in 2014. Dr. Rollene Fare had been treating her with palliative beta blockers for a racing heartbeat related to sinus tachycardia. She has a strong family history of coronary disease, occurring prematurely in her mother.  This 48 year old female has a history of COPD, anxiety, hypothyroidism and has chronic neck pain after cervical spine surgery. He is ago she had a prolonged hospitalization with respiratory failure after an accident. She had a tracheostomy that has since been closed. In 2014 she was hospitalized with chest discomfort. There was evidence of the LAD artery calcification on CT angiography. She subsequently underwent cardiac catheterization (which I happened to perform). There was mild calcification in the LAD with minimal luminal irregularities. 20-30% stenoses were seen in the left circumflex system and mild calcification without stenosis was seen in the right coronary artery.  In October 2014, she was admitted with abdominal pain and intractable nausea and vomiting related to inflammatory gastroenteritis, presumably infectious.  Is currently having a lot of problems regulating thyroid hormone dosage. In February her TSH was 116. Interestingly, her TSH seems to gradually start increasing after her cardiac catheterization (TSH was normal on 07/28/2013). She is seen by Dr. Chalmers Cater.   Her cholesterol and LDL cholesterol levels were elevated last year but she's not taking any cholesterol-lowering medication. She has also run out of her beta blocker which was stopped abruptly roughly a month ago and this may explain some of her recent problems with tachycardia.   Allergies  Allergen Reactions  . Adhesive [Tape]     Unknown  . Cymbalta [Duloxetine Hcl] Other (See  Comments)    Makes my head do crazy things  . Ivp Dye [Iodinated Diagnostic Agents]     Unknown  . Other     Contrast/IV dye    Current Outpatient Prescriptions  Medication Sig Dispense Refill  . ALPRAZolam (XANAX) 1 MG tablet TAKE 1 TABLET BY MOUTH TWICE DAILY  60 tablet  3  . aspirin 81 MG tablet Take 1 tablet (81 mg total) by mouth daily.      . Cholecalciferol (VITAMIN D3) 2000 UNITS TABS Take 2,000 Units by mouth daily.      Marland Kitchen levothyroxine (SYNTHROID) 100 MCG tablet Take 100 mcg by mouth daily before breakfast.      . metoprolol succinate (TOPROL-XL) 25 MG 24 hr tablet Take 25 mg by mouth daily.      Marland Kitchen oxyCODONE-acetaminophen (PERCOCET) 10-325 MG per tablet One q 4 hours prn , no greater than 5 per day, May fill 01/06/14  150 tablet  0  . traZODone (DESYREL) 100 MG tablet Take 1 tablet (100 mg total) by mouth at bedtime.  30 tablet  3   No current facility-administered medications for this visit.    Past Medical History  Diagnosis Date  . Post concussion syndrome 1999  . 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 Cervical pain (neck)     chronic  . Hypothyroidism   . Anxiety   . Arthritis     right hip    Past Surgical History  Procedure Laterality Date  . Cervix surgery  10/11  . Cardiovascular stress test  03/12    normal  . Tubal ligation Bilateral   . Knee surgery Left 12/89  .  Back surgery      cervical fusion  . Abdominal hysterectomy    . Cardiac catheterization  2014    mild CAD, no significant stenosis  . Colonoscopy with esophagogastroduodenoscopy (egd) N/A 10/15/2013    GYI:RSWNIOE reflux esophagitis.  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 I suspect the patient had a recent enteric infection which was responsible for the CT findings    Family History  Problem Relation Age of Onset  . Coronary artery disease Mother 3    MI  . Colon cancer Mother     59s, living    History   Social History  .  Marital Status: Married    Spouse Name: [name]    Number of Children: 3  . Years of Education: 13   Occupational History  .  [employer]   Social History Main Topics  . Smoking status: Current Every Day Smoker -- 1.00 packs/day for 30 years    Types: Cigarettes  . Smokeless tobacco: Never Used  . Alcohol Use: No  . Drug Use: No  . Sexual Activity: Yes    Birth Control/ Protection: Surgical   Other Topics Concern  . Not on file   Social History Narrative   Patient is married [name]) and lives at home with her husband.   Patient has three children.   Patient is currently not working.   Patient is right-handed.   Patient has a college education.   Patient drinks about four sodas daily.    Review of systems: Her prominent complaint is of profound fatigue, even walking in her house. She denies true dyspnea on exertion and does not have chest pain. She has occasional upper epigastric discomfort, related to meals. She denies recent nausea, vomiting, no cramps, change in bowel pattern or overt bleeding. She denies urinary complaints. She does not have a problem with cough, wheezing, hemoptysis, fever or chills. She denies recent neurological complaints. She does not have orthopnea or paroxysmal nocturnal dyspnea. She has not had problems of lower extremity edema  PHYSICAL EXAM BP 110/68  Pulse 85  Ht 5' 7"  (1.702 m)  Wt 156 lb 3.2 oz (70.852 kg)  BMI 24.46 kg/m2  General: Alert, oriented x3, no distress Head: no evidence of trauma, PERRL, EOMI, no exophtalmos or lid lag, no myxedema, no xanthelasma; normal ears, nose and oropharynx Neck: normal jugular venous pulsations and no hepatojugular reflux; brisk carotid pulses without delay and no carotid bruits Chest: clear to auscultation, no signs of consolidation by percussion or palpation, normal fremitus, symmetrical and full respiratory excursions Cardiovascular: normal position and quality of the apical impulse, regular rhythm,  normal first and second heart sounds, no murmurs, rubs or gallops Abdomen: no tenderness or distention, no masses by palpation, no abnormal pulsatility or arterial bruits, normal bowel sounds, no hepatosplenomegaly Extremities: no clubbing, cyanosis or edema; 2+ radial, ulnar and brachial pulses bilaterally; 2+ right femoral, posterior tibial and dorsalis pedis pulses; 2+ left femoral, posterior tibial and dorsalis pedis pulses; no subclavian or femoral bruits Neurological: grossly nonfocal   EKG: Normal sinus rhythm, pronounced anterior and inferior ST depression and T-wave inversion, very similar to her electrocardiogram from August 2014  Lipid Panel     Component Value Date/Time   CHOL 220* 07/28/2013 1000   TRIG 171* 07/28/2013 1000   HDL 29* 07/28/2013 1000   CHOLHDL 7.6 07/28/2013 1000   VLDL 34 07/28/2013 1000   LDLCALC 157* 07/28/2013 1000    BMET  Component Value Date/Time   NA 138 10/09/2013 1731   K 3.8 10/09/2013 1731   CL 101 10/09/2013 1731   CO2 23 10/09/2013 1731   GLUCOSE 96 10/09/2013 1731   BUN 7 10/09/2013 1731   CREATININE 0.82 10/09/2013 1731   CREATININE 0.69 10/04/2013 0220   CALCIUM 11.2* 10/09/2013 1731   GFRNONAA >90 10/04/2013 0220   GFRAA >90 10/04/2013 0220     ASSESSMENT AND PLAN Chest pain at rest Just within the last 12 months she has had a normal nuclear stress test and a cardiac catheterization that showed only mild coronary atherosclerosis, without flow-limiting lesions. Her chest discomfort seems to be primarily associated with meals and is therefore likely gastrointestinal in etiology.  Hyperlipidemia Her coronary atherosclerosis findings are mild, but considering her female gender and young age I think it is recommended that we treat her moderate hyperlipidemia. Target LDL at least less than 100, preferably less than 70. Will repeat a lipid profile, since her thyroid function has been so variable recently.  Current smoker Smoking  cessation is strongly recommended. She does not appear ready to make a commitment.  Unspecified hypothyroidism Once her thyroid function is regulated, discuss restarting her on a low dose of beta blocker. She seemeds to benefit symptomatically from this.   Orders Placed This Encounter  Procedures  . EKG 12-Lead   Meds ordered this encounter  Medications  . Cholecalciferol (VITAMIN D3) 2000 UNITS TABS    Sig: Take 2,000 Units by mouth daily.  Marland Kitchen levothyroxine (SYNTHROID) 100 MCG tablet    Sig: Take 100 mcg by mouth daily before breakfast.    Nanea Jared  Sanda Klein, MD, [facility] [phone] office [phone] pager

## 2014-04-27 ENCOUNTER — Ambulatory Visit (HOSPITAL_COMMUNITY)
Admission: RE | Admit: 2014-04-27 | Discharge: 2014-04-27 | Disposition: A | Discharge status: Home / Self Care | Payer: BC Managed Care – PPO | Source: Ambulatory Visit | Attending: Endocrinology | Admitting: Endocrinology

## 2014-04-27 DIAGNOSIS — E049 Nontoxic goiter, unspecified: Secondary | ICD-10-CM

## 2014-04-27 DIAGNOSIS — E039 Hypothyroidism, unspecified: Secondary | ICD-10-CM | POA: Insufficient documentation

## 2014-05-25 ENCOUNTER — Other Ambulatory Visit: Payer: Self-pay | Admitting: Family Medicine

## 2014-05-26 NOTE — Telephone Encounter (Signed)
May give this +2 refills 

## 2014-05-27 ENCOUNTER — Other Ambulatory Visit: Payer: Self-pay | Admitting: *Deleted

## 2014-05-27 MED ORDER — METOPROLOL SUCCINATE ER 25 MG PO TB24: 25.0000 mg | ORAL_TABLET | Freq: Every day | ORAL | Status: DC

## 2014-05-27 NOTE — Telephone Encounter (Signed)
Rx was sent to pharmacy electronically. 

## 2014-05-29 ENCOUNTER — Telehealth: Payer: Self-pay | Admitting: Family Medicine

## 2014-05-29 MED ORDER — ALPRAZOLAM 1 MG PO TABS: ORAL_TABLET | ORAL | Status: DC

## 2014-05-29 NOTE — Telephone Encounter (Signed)
May refill the patient medication x2. May give her an appointment to be seen next week Tuesday Wednesday or Thursday

## 2014-05-29 NOTE — Telephone Encounter (Signed)
Notified patient that script will be faxed to pharmacy. Transferred to front desk to schedule appointment.

## 2014-05-29 NOTE — Telephone Encounter (Signed)
Last seen 04/02/14

## 2014-05-29 NOTE — Telephone Encounter (Signed)
Patient needs Rx for her xanax to [facility].  Also, patient wanted to make an appointment for next week because she has been having nausea and stomach pain and neck swelling. We are down to urgent slots and she wants to know if there is anywhere we can work her in?

## 2014-06-03 ENCOUNTER — Ambulatory Visit (INDEPENDENT_AMBULATORY_CARE_PROVIDER_SITE_OTHER): Payer: BC Managed Care – PPO | Admitting: Family Medicine

## 2014-06-03 ENCOUNTER — Encounter: Payer: Self-pay | Admitting: Family Medicine

## 2014-06-03 VITALS — BP 108/74 | Temp 98.3°F | Ht 67.0 in | Wt 159.0 lb

## 2014-06-03 DIAGNOSIS — G8929 Other chronic pain: Secondary | ICD-10-CM

## 2014-06-03 DIAGNOSIS — M542 Cervicalgia: Secondary | ICD-10-CM

## 2014-06-03 MED ORDER — OXYCODONE-ACETAMINOPHEN 10-325 MG PO TABS: ORAL_TABLET | ORAL | Status: DC

## 2014-06-03 MED ORDER — OXYCODONE-ACETAMINOPHEN 10-325 MG PO TABS
ORAL_TABLET | ORAL | Status: DC
Start: 2014-06-03 — End: 2014-06-03

## 2014-06-03 MED ORDER — ALPRAZOLAM 1 MG PO TABS: ORAL_TABLET | ORAL | Status: DC

## 2014-06-03 NOTE — Progress Notes (Signed)
   Subjective:    Patient ID: Melanie Shepard, female    DOB: 04-Dec-1966, 48 y.o.   MRN: [id number]  HPIFollow up on neck swelling. Never received results from thyroid ultrasound that Dr. Chalmers Cater ordered.   This patient was seen today for chronic pain  The medication list was reviewed and updated.   -Compliance with pain medication: good  The patient was advised the importance of maintaining medication and not using illegal substances with these.  Refills needed: yes  The patient was educated that we can provide 3 monthly scripts for their medication, it is their responsibility to follow the instructions.  Side effects or complications from medications: none  Patient is aware that pain medications are meant to minimize the severity of the pain to allow their pain levels to improve to allow for better function. They are aware of that pain medications cannot totally remove their pain.  Due for UDT ( at least once per year) : deferred to fall          Review of Systems  Constitutional: Negative for activity change, appetite change and fatigue.  Gastrointestinal: Negative for abdominal pain.  Neurological: Negative for headaches.  Psychiatric/Behavioral: Negative for behavioral problems.       Objective:   Physical Exam  Vitals reviewed. Constitutional: She appears well-nourished. No distress.  HENT:  Head: Normocephalic.  Cardiovascular: Normal rate, regular rhythm and normal heart sounds.   No murmur heard. Pulmonary/Chest: Effort normal and breath sounds normal.  Musculoskeletal: She exhibits no edema.  Lymphadenopathy:    She has no cervical adenopathy.  Neurological: She is alert.  Psychiatric: Her behavior is normal.          Assessment & Plan:  #1 chronic neck pain this patient does use pain medication she does not abuse it. I went ahead and gave her 3 additional scripts this will get her in through the in of September. She ought to followup at that point in  time.  #2 hypothyroidism she is not correctly taking her medicines I encourage her to take her medicine every single day also encourage her to followup with her endocrinologist as directed. I reassured the patient her ultrasound looked good  #3 patient is permanently disabled

## 2014-07-20 ENCOUNTER — Encounter: Payer: Self-pay | Admitting: Family Medicine

## 2014-07-20 ENCOUNTER — Ambulatory Visit (INDEPENDENT_AMBULATORY_CARE_PROVIDER_SITE_OTHER): Payer: BC Managed Care – PPO | Admitting: Family Medicine

## 2014-07-20 VITALS — BP 122/82 | Ht 67.0 in

## 2014-07-20 DIAGNOSIS — D539 Nutritional anemia, unspecified: Secondary | ICD-10-CM

## 2014-07-20 DIAGNOSIS — K2100 Gastro-esophageal reflux disease with esophagitis, without bleeding: Secondary | ICD-10-CM

## 2014-07-20 DIAGNOSIS — D531 Other megaloblastic anemias, not elsewhere classified: Secondary | ICD-10-CM

## 2014-07-20 DIAGNOSIS — E039 Hypothyroidism, unspecified: Secondary | ICD-10-CM

## 2014-07-20 MED ORDER — PANTOPRAZOLE SODIUM 40 MG PO TBEC: 40.0000 mg | DELAYED_RELEASE_TABLET | Freq: Every day | ORAL | Status: DC

## 2014-07-20 NOTE — Progress Notes (Signed)
   Subjective:    Patient ID: Melanie Shepard, female    DOB: 06-Apr-1966, 48 y.o.   MRN: [id number]  HPI Patient arrives for hospital follow up for chest pain and nausea. Patient states her TSH was 26- currently on thyroid med and seeing an endocrinologist for her thyroid. She relates a lot of tiredness. She also states having some esophageal burning and discomfort. She also states the medicine seems to be helping a little bit. Also relates feeling run down fatigued tired.  Husband needs FMLA papers cause he states he is out with her about 3-5 days per month to help her with her healthcare needs  Review of Systems    see above. Objective:   Physical Exam  Lungs clear heart regular mild weight gain noted abdomen soft extremities no edema      Assessment & Plan:  Chest pain- [place] ED, I don't find evidence of cardiac disease. I think this is probably more reflux she is taking acid blocker she had EGD done about a year and a half ago and it showed esophagitis so I believe that is her source she had a thorough cardiac workup about a year and a half ago  She was counseled to quit smoking was told that will reduce her risk for heart disease as she gets older  Thyroid underactive she needs to recheck TSH may need adjustment of medicines she sees Dr.Ballen in [place] for this  Patient had elevated MCV on her blood tests from the hospital and [facility] I recommend checking folic acid and L45 levels  25 minutes spent with family

## 2014-07-21 LAB — TSH: TSH: 22.324 u[IU]/mL — ABNORMAL HIGH (ref 0.350–4.500)

## 2014-07-21 LAB — VITAMIN B12: Vitamin B-12: 583 pg/mL (ref 211–911)

## 2014-07-21 LAB — FOLATE: Folate: 8.7 ng/mL

## 2014-07-21 LAB — T4, FREE: Free T4: 1 ng/dL (ref 0.80–1.80)

## 2014-07-24 ENCOUNTER — Other Ambulatory Visit: Payer: Self-pay | Admitting: *Deleted

## 2014-07-24 MED ORDER — LEVOTHYROXINE SODIUM 125 MCG PO TABS: 125.0000 ug | ORAL_TABLET | Freq: Every day | ORAL | Status: DC

## 2014-07-31 ENCOUNTER — Encounter: Payer: Self-pay | Admitting: Family Medicine

## 2014-08-03 ENCOUNTER — Telehealth: Payer: Self-pay | Admitting: Family Medicine

## 2014-08-03 MED ORDER — ONDANSETRON HCL 8 MG PO TABS: 8.0000 mg | ORAL_TABLET | Freq: Three times a day (TID) | ORAL | Status: DC | PRN

## 2014-08-03 NOTE — Telephone Encounter (Signed)
This patient may use Zofran 8 mg 1 3 times a day when necessary, 21, 1 refill for nausea. (Second choices Phenergan if she does not want Zofran-let me know and I will let you know the instruction) if ongoing troubles with nausea recommend followup

## 2014-08-03 NOTE — Telephone Encounter (Signed)
Pt needs results from her labs she had done here last,  As well as the lab results from her [facility]   Please fax to [facility]  [phone]  For Dr Chalmers Cater for her [place]

## 2014-08-03 NOTE — Telephone Encounter (Signed)
Medication to sent to pharmacy. Patient was notified.

## 2014-08-03 NOTE — Telephone Encounter (Signed)
Patient would like something called in for nausea.   [place]

## 2014-09-30 ENCOUNTER — Telehealth: Payer: Self-pay | Admitting: Family Medicine

## 2014-09-30 MED ORDER — OXYCODONE-ACETAMINOPHEN 10-325 MG PO TABS
ORAL_TABLET | ORAL | Status: DC
Start: 2014-09-30 — End: 2014-10-05

## 2014-09-30 NOTE — Telephone Encounter (Signed)
She may have her prescription. Please nurses, verify with pharmacy that dates. She must keep followup appointment as planned

## 2014-09-30 NOTE — Telephone Encounter (Signed)
Patient needs Rx for oxyCODONE-acetaminophen (PERCOCET) 10-325 MG per tablet.  This medicine is due to be filled on Sunday of this week.  She has a follow up appointment on 10/05/14.

## 2014-09-30 NOTE — Telephone Encounter (Signed)
Rx up front for patient pick up. Patient notified. 

## 2014-09-30 NOTE — Telephone Encounter (Signed)
Last pain managment visit 05/2014

## 2014-10-05 ENCOUNTER — Encounter: Payer: Self-pay | Admitting: Family Medicine

## 2014-10-05 ENCOUNTER — Ambulatory Visit (INDEPENDENT_AMBULATORY_CARE_PROVIDER_SITE_OTHER): Payer: BC Managed Care – PPO | Admitting: Family Medicine

## 2014-10-05 VITALS — BP 122/78 | Ht 67.0 in | Wt 156.8 lb

## 2014-10-05 DIAGNOSIS — G894 Chronic pain syndrome: Secondary | ICD-10-CM

## 2014-10-05 DIAGNOSIS — E038 Other specified hypothyroidism: Secondary | ICD-10-CM

## 2014-10-05 DIAGNOSIS — G8929 Other chronic pain: Secondary | ICD-10-CM

## 2014-10-05 DIAGNOSIS — M542 Cervicalgia: Secondary | ICD-10-CM

## 2014-10-05 DIAGNOSIS — E559 Vitamin D deficiency, unspecified: Secondary | ICD-10-CM

## 2014-10-05 MED ORDER — OXYCODONE-ACETAMINOPHEN 10-325 MG PO TABS: ORAL_TABLET | ORAL | Status: DC

## 2014-10-05 MED ORDER — TRAZODONE HCL 100 MG PO TABS: 150.0000 mg | ORAL_TABLET | Freq: Every day | ORAL | Status: DC

## 2014-10-05 MED ORDER — ALPRAZOLAM 1 MG PO TABS: ORAL_TABLET | ORAL | Status: DC

## 2014-10-05 NOTE — Patient Instructions (Signed)

## 2014-10-05 NOTE — Progress Notes (Signed)
   Subjective:    Patient ID: Melanie Shepard, female    DOB: June 13, 1966, 48 y.o.   MRN: [id number]  HPI Lumbar pain  This patient was seen today for chronic pain. Patient states medication helps her with her discomfort makes it more functional but she is unable to do much around the house. She has chronic pain discomfort when she tries to do any type of housework or yard work. Patient also restricts driving to minimal. Her husband does most of it for her. She denies having side effects with the medications.  She is frustrated by her thyroid condition she is not seen the specialist recently we adjusted her medicine back in August. She requests that copies of these tests be forwarded to her specialist.  The medication list was reviewed and updated.   -Compliance with pain medication: good  The patient was advised the importance of maintaining medication and not using illegal substances with these.  Refills needed: yes  The patient was educated that we can provide 3 monthly scripts for their medication, it is their responsibility to follow the instructions.  Side effects or complications from medications: none  Patient is aware that pain medications are meant to minimize the severity of the pain to allow their pain levels to improve to allow for better function. They are aware of that pain medications cannot totally remove their pain.  Due for UDT ( at least once per year) : next visit        Review of Systems  Constitutional: Negative for activity change, appetite change and fatigue.  Gastrointestinal: Negative for abdominal pain.  Neurological: Negative for headaches.  Psychiatric/Behavioral: Negative for behavioral problems.       Objective:   Physical Exam  Vitals reviewed. Constitutional: She appears well-nourished. No distress.  HENT:  Head: Normocephalic.  Cardiovascular: Normal rate, regular rhythm and normal heart sounds.   No murmur heard. Pulmonary/Chest: Effort  normal and breath sounds normal.  Musculoskeletal: She exhibits no edema.  Lymphadenopathy:    She has no cervical adenopathy.  Neurological: She is alert.  Psychiatric: Her behavior is normal.   Subjective discomfort in the back of her neck bilateral worse on the left than the right radiates into both arms but more down into the left mid arm.       Assessment & Plan:  Sleep- increase Trazadone to 150 mg, hopefully this will help.  Chronic pain- disabled, this patient is disabled there is no way that she can work a standard job at all. I believe this patient is disabled. There is no way that she would be hired on. There is no way she could keep a job. With chronic pain she's having in her neck it limits her in regards to activity around the house and outside.  Try to quit smoking  Hypothyroidism-check TSH, T4 continue medication for the results to her endocrinologist Dr. Soyla Murphy  Stress/anxiety-overall hanging in there her dad is been through some severe chronic health issues recently  Patient to follow-up in about 3-1/2 months

## 2014-11-19 ENCOUNTER — Encounter (HOSPITAL_COMMUNITY): Payer: Self-pay | Admitting: Cardiovascular Disease

## 2015-01-15 ENCOUNTER — Ambulatory Visit (INDEPENDENT_AMBULATORY_CARE_PROVIDER_SITE_OTHER): Payer: BLUE CROSS/BLUE SHIELD | Admitting: Family Medicine

## 2015-01-15 ENCOUNTER — Encounter: Payer: Self-pay | Admitting: Family Medicine

## 2015-01-15 VITALS — BP 102/70 | Ht 67.0 in | Wt 169.4 lb

## 2015-01-15 DIAGNOSIS — K219 Gastro-esophageal reflux disease without esophagitis: Secondary | ICD-10-CM

## 2015-01-15 DIAGNOSIS — Z79899 Other long term (current) drug therapy: Secondary | ICD-10-CM

## 2015-01-15 DIAGNOSIS — G8929 Other chronic pain: Secondary | ICD-10-CM

## 2015-01-15 DIAGNOSIS — G894 Chronic pain syndrome: Secondary | ICD-10-CM

## 2015-01-15 DIAGNOSIS — M542 Cervicalgia: Secondary | ICD-10-CM

## 2015-01-15 DIAGNOSIS — E038 Other specified hypothyroidism: Secondary | ICD-10-CM

## 2015-01-15 LAB — BASIC METABOLIC PANEL
BUN: 6 mg/dL (ref 6–23)
CALCIUM: 9.7 mg/dL (ref 8.4–10.5)
CO2: 27 mEq/L (ref 19–32)
Chloride: 101 mEq/L (ref 96–112)
Creat: 0.84 mg/dL (ref 0.50–1.10)
Glucose, Bld: 96 mg/dL (ref 70–99)
Potassium: 4.1 mEq/L (ref 3.5–5.3)
SODIUM: 137 meq/L (ref 135–145)

## 2015-01-15 LAB — T3: T3 TOTAL: 73.5 ng/dL — AB (ref 80.0–204.0)

## 2015-01-15 LAB — TSH: TSH: 135.616 u[IU]/mL — ABNORMAL HIGH (ref 0.350–4.500)

## 2015-01-15 LAB — T4, FREE: Free T4: 0.5 ng/dL — ABNORMAL LOW (ref 0.80–1.80)

## 2015-01-15 MED ORDER — ALPRAZOLAM 1 MG PO TABS: ORAL_TABLET | ORAL | Status: DC

## 2015-01-15 MED ORDER — PANTOPRAZOLE SODIUM 40 MG PO TBEC: 40.0000 mg | DELAYED_RELEASE_TABLET | Freq: Every day | ORAL | Status: DC

## 2015-01-15 MED ORDER — OXYCODONE-ACETAMINOPHEN 10-325 MG PO TABS: ORAL_TABLET | ORAL | Status: DC

## 2015-01-15 MED ORDER — ONDANSETRON HCL 8 MG PO TABS: 8.0000 mg | ORAL_TABLET | Freq: Three times a day (TID) | ORAL | Status: DC | PRN

## 2015-01-15 NOTE — Patient Instructions (Signed)

## 2015-01-15 NOTE — Progress Notes (Addendum)
Subjective:    Patient ID: Melanie Shepard, female    DOB: [birth year], 49 y.o.   MRN: [id number]  HPI This patient was seen today for chronic pain  The medication list was reviewed and updated.   -Compliance with pain medication: yes  The patient was advised the importance of maintaining medication and not using illegal substances with these.  Refills needed: yes  The patient was educated that we can provide 3 monthly scripts for their medication, it is their responsibility to follow the instructions.  Side effects or complications from medications: none  Patient is aware that pain medications are meant to minimize the severity of the pain to allow their pain levels to improve to allow for better function. They are aware of that pain medications cannot totally remove their pain.  Due for UDT ( at least once per year) : next visit  Patient states that her acid reflux has worsened. She states that she can feel it in her throat. This has been present for about 2 weeks now.    Patient has a pain in her rib cage on the left side also.  Patient states that she cannot stand up for no longer than 15 minutes at a time and she has pain in her lower back.   Patient states that she has pain in her shoulders and neck also.  Pt Concerns 1-Has URI SX- present  For 6 days, sinus pain ,congestion,wheeze at night, no fevers, still smoking (knows she needs to quit) 2- lower ribs anterior right ribs- present for 1 year. Sharp pain. Burning sensation. Radiates into the back. Some nausea.  3- standing pain in lower back 4- GERD SX- throat sx, burning, no vomiting, some nausea 5- chronic pain, neck and shoulder 6- thyroid-no taking med regularly,   Review of Systems She denies excessive thirst urination she does relate chronic pain discomfort she relates a lot of stiffness in her neck or lower back her left hip radiates down the left leg. Finds himself feeling fatigued tired. She relates weight gain.  She still smokes she notes she needs to quit.    Objective:   Physical Exam  Next subjective tenderness throughout the neck limited range of motion. No masses felt in the neck. Evidence of previous surgery noted. Lungs clear no crackle heart is regular abdomen is soft slight weight gain noted extremities slight edema in the ankles skin warm dry neurologic patient appropriate. Not depressed in affect. Subjective discomfort left anterior lower ribs no abnormality fell     Assessment & Plan:  #1 mild URI viral syndrome should gradually get better over the next few days if not call us and we will send an antibiotic #2 anterior rib pain could be related to gastritis she is having along with reflux try PPI follow-up in 4 weeks if not doing better may need scans if ongoing troubles #3 low back pain discomfort into the left hip I believe is a combination of lumbar sacral disease as well as spinal disc problems probably causing some sciatica. I also believe that she has left hip are osteoarthritis as well. #4 GERD-PPI should help if not doing dramatically better in the next 2-3 weeks to notify us may need referral to GI for endoscopy #5 chronic pain prescriptions for pain medicine given she does not abuse his medicine states it does help her function she she is not capable working currently. She is disabled between her multiple health problems. This patient has been through multiple tests  and specialists. She had previous neck surgery which did not cure her problems and in fact increasing amount of troubles she was having. She has tried home therapy as well as physical therapy. She has tried anti-inflammatories as well as pain medications. She suffers with severe neck pain and headaches. She also suffers with low back pain left hip pain sciatica into the left leg. Because of her chronic neck pain she has to take high doses of narcotics which affect her overall alertness. She does not drive because of the  combination of the medication as well as chronic neck pain and discomfort. She finds that sitting for more than 15-20 minutes her back will bother her and she has to stand up she typically cannot do much walking at all because of her back pain she finds that her neck she has to keep steady area and looking left and right up and down causes her significant trouble. She is been unable to do anything except for minimal ADLs as well as minimal food preparation at home. Patient does not cook. She does not do any of the more vigorous home activities such as sweeping mopping. In addition to this the patient is having such difficulty that the combination of her chronic pain as well as side effects from the medication in my opinion keep her from being able to work any gainful employment. I believe she is disabled. #6 hypothyroidism-she has not been taking medication she's fearful the medicine I tried to reassure her we'll recheck lab work we'll restart her levothyroxine when the lab work comes back at a low dose and adjust every 6-8 weeks to gradually get it to where it needs to be.  40 minutes spent with patient [phone] greater than half in discussion of these multiple issues  A copy of this will be sent to her Lawyer at her [facility], fax [phone]

## 2015-01-17 NOTE — Addendum Note (Signed)
Addended by: Sallee Lange A on: 01/17/2015 02:21 PM   Modules accepted: Level of Service

## 2015-01-18 MED ORDER — LEVOTHYROXINE SODIUM 100 MCG PO TABS: 100.0000 ug | ORAL_TABLET | Freq: Every day | ORAL | Status: DC

## 2015-01-18 NOTE — Addendum Note (Signed)
Addended by: Dairl Ponder on: 01/18/2015 02:23 PM   Modules accepted: Orders

## 2015-01-26 ENCOUNTER — Encounter: Payer: Self-pay | Admitting: Internal Medicine

## 2015-01-26 ENCOUNTER — Ambulatory Visit (INDEPENDENT_AMBULATORY_CARE_PROVIDER_SITE_OTHER): Payer: Self-pay | Admitting: Family Medicine

## 2015-01-26 ENCOUNTER — Encounter: Payer: Self-pay | Admitting: Family Medicine

## 2015-01-26 ENCOUNTER — Ambulatory Visit (HOSPITAL_COMMUNITY)
Admission: RE | Admit: 2015-01-26 | Discharge: 2015-01-26 | Disposition: A | Discharge status: Home / Self Care | Payer: BLUE CROSS/BLUE SHIELD | Source: Ambulatory Visit | Attending: Family Medicine | Admitting: Family Medicine

## 2015-01-26 VITALS — Temp 98.7°F | Ht 67.0 in | Wt 169.0 lb

## 2015-01-26 DIAGNOSIS — R0789 Other chest pain: Secondary | ICD-10-CM | POA: Diagnosis present

## 2015-01-26 DIAGNOSIS — J208 Acute bronchitis due to other specified organisms: Secondary | ICD-10-CM

## 2015-01-26 DIAGNOSIS — Z0289 Encounter for other administrative examinations: Secondary | ICD-10-CM

## 2015-01-26 DIAGNOSIS — R1010 Upper abdominal pain, unspecified: Secondary | ICD-10-CM

## 2015-01-26 MED ORDER — AZITHROMYCIN 250 MG PO TABS: ORAL_TABLET | ORAL | Status: DC

## 2015-01-26 NOTE — Progress Notes (Signed)
   Subjective:    Patient ID: Melanie Shepard, female    DOB: 08-08-1966, 49 y.o.   MRN: [id number]  HPI Patient arrives with complaint of left side pain for several months. Pain there always, present off and on for 6 months or more and all the time for 2 months Worse with movement Pain around a 4 or 5 all the time Nothing helps Does not awaken her Radiates into the back in the same area No initial injury  nnalso with a cough since being sick a few weeks back Review of Systems  Constitutional: Negative for activity change and appetite change.  Gastrointestinal: Positive for nausea. Negative for vomiting, abdominal pain, diarrhea and blood in stool.  Neurological: Negative for weakness.  Psychiatric/Behavioral: Negative for confusion.       Objective:   Physical Exam  Constitutional: She appears well-nourished. No distress.  Cardiovascular: Normal rate, regular rhythm and normal heart sounds.   No murmur heard. Pulmonary/Chest: Effort normal and breath sounds normal. No respiratory distress.  Abdominal: Soft. There is tenderness (upper and upper left).  Musculoskeletal: She exhibits no edema.  Lymphadenopathy:    She has no cervical adenopathy.  Neurological: She is alert. She exhibits normal muscle tone.  Psychiatric: Her behavior is normal.  Vitals reviewed.  Patient had EGD back in 2014 with Dr. Sydell Axon       Assessment & Plan:  Thyroid I assured her that she need to take her medication if she feels like she's having ongoing trouble may need to send her back to endocrinology  Left upper quadrant abdominal discomfort I really don't feel this is related to her lungs I have encouraged her to quit smoking we will refer her to gastroenterology continue PPI she'll probably need an EGD possible rule out of H. Pylori  She's had a recent chest cold with congestion coughing it's getting better but we will do a chest x-ray

## 2015-01-28 NOTE — Progress Notes (Signed)
Patient notified and verbalized understanding of the test results. No further questions. 

## 2015-02-04 ENCOUNTER — Ambulatory Visit: Payer: BLUE CROSS/BLUE SHIELD | Admitting: Nurse Practitioner

## 2015-02-04 ENCOUNTER — Telehealth: Payer: Self-pay | Admitting: Nurse Practitioner

## 2015-02-04 ENCOUNTER — Encounter: Payer: Self-pay | Admitting: Nurse Practitioner

## 2015-02-04 NOTE — Telephone Encounter (Signed)
PATIENT WAS A NO SHOW 02/04/15 AND LETTER WAS SENT

## 2015-02-04 NOTE — Telephone Encounter (Signed)
Noted  

## 2015-02-16 ENCOUNTER — Other Ambulatory Visit: Payer: Self-pay | Admitting: Family Medicine

## 2015-02-17 ENCOUNTER — Ambulatory Visit: Payer: BLUE CROSS/BLUE SHIELD | Admitting: Gastroenterology

## 2015-03-01 ENCOUNTER — Other Ambulatory Visit: Payer: Self-pay | Admitting: Family Medicine

## 2015-03-09 ENCOUNTER — Encounter: Payer: Self-pay | Admitting: Nurse Practitioner

## 2015-03-09 ENCOUNTER — Ambulatory Visit (INDEPENDENT_AMBULATORY_CARE_PROVIDER_SITE_OTHER): Payer: BLUE CROSS/BLUE SHIELD | Admitting: Nurse Practitioner

## 2015-03-09 VITALS — BP 106/61 | HR 81 | Temp 96.7°F | Ht 67.0 in | Wt 165.4 lb

## 2015-03-09 DIAGNOSIS — R1013 Epigastric pain: Secondary | ICD-10-CM

## 2015-03-09 DIAGNOSIS — R110 Nausea: Secondary | ICD-10-CM | POA: Insufficient documentation

## 2015-03-09 DIAGNOSIS — K219 Gastro-esophageal reflux disease without esophagitis: Secondary | ICD-10-CM | POA: Diagnosis not present

## 2015-03-09 MED ORDER — SUCRALFATE 1 GM/10ML PO SUSP: 1.0000 g | Freq: Three times a day (TID) | ORAL | Status: DC | PRN

## 2015-03-09 MED ORDER — DEXLANSOPRAZOLE 60 MG PO CPDR: 60.0000 mg | DELAYED_RELEASE_CAPSULE | Freq: Every day | ORAL | Status: DC

## 2015-03-09 MED ORDER — ONDANSETRON HCL 4 MG PO TABS: 4.0000 mg | ORAL_TABLET | Freq: Three times a day (TID) | ORAL | Status: DC | PRN

## 2015-03-09 NOTE — Patient Instructions (Signed)
1. Stop protonix 2. Start Dexilant 60 mg daily - we are providing you with a voucher for lower copay 3. Take a carafate liquid as needed for breakthrough symptoms 4. Take the Zofran as needed for nausea 5. Avoid ALL NSAIDs (Advil, ibuprofen, motrin, Naproxen, etc) 6. Limit Tylenol to maximum 2,000 mg a day 7. Follow-up in 3 months or sooner if symptoms worsen or do not improve.

## 2015-03-09 NOTE — Progress Notes (Signed)
CC'ED TO PCP 

## 2015-03-09 NOTE — Assessment & Plan Note (Signed)
See GERD A/P 

## 2015-03-09 NOTE — Assessment & Plan Note (Signed)
49 year old female presents for office visit for worsening dyspepsia symptoms including epigastric pain, persistent nausea, acid regurgitation. Symptoms have been intermittent since approximately 1-1/2 years ago when she had a colon infection however they become persistent and worsening in the past 6 months. On her last endoscopy and colonoscopy she was recommended to stop Protonix and start Dexilant 60 mg daily, however she did not do this. Denies any red flag or warning signs such as unintentional weight loss persistent unexplained fevers, severe abdominal pain, peritoneal signs, and others. Today we'll have her stop her Protonix, start Dexilant 60 mg daily for which we'll provide samples and oriented alter for, as well as Carafate 1 g 3 times a day for breakthrough symptoms. We will also refill her Zofran 4 mg 3 times a day as needed for nausea however we expressed to her to not take this chronically as it may mask improvement or an improvement in her symptoms with Dexilant. We'll have her return for reevaluation in 3 months symptoms.

## 2015-03-09 NOTE — Progress Notes (Signed)
Referring Provider: Kathyrn Drown, MD Primary Care Physician:  Sallee Lange, MD Primary GI:  Dr. Gala Romney  Chief Complaint  Patient presents with  . Abdominal Pain    LUQ     HPI:   49 year old female presents for office visit on referral from PCP for upper abdominal pain 1 month. Last EGD on 10/15/2013 which is diagnostic for persistent nausea, dyspepsia, and vomiting. 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 Recommendations were to stop Protonix, begin Dexalone 60 mg daily, and a five-day low-dose course of Reglan. Colonoscopy on the same day found hemorrhoids, otherwise normal rectum, 2 polyps approximately 4 mm at the splenic flexure, 2 adjacent diminutive polyps, and a single 8 mm serrated polyp. The distal 10 cm of terminal ileal mucosa was normal. Pathology shows multiple fragments of colon polyp from the splenic flexure and sigmoid all hyperplastic polyps with no adenomatous change or malignancy identified. Etiology of her procedure reports include recent enteric infection responsible for gastroparesis.  Today she states she is currently on Protonix 40 mg and never obtained Dexilant samples to try as per recommendations. Her PCP has given her a course of Zofran for nausea. Her abdominal pain has been intermittent since her colon infection about 1.5 years ago. Pain has worsening and become persistent in the past 6 months. Pain is epigastric in nature, described as burning/sharp, is constant. Has concurrent nausea, no vomiting. Occasional regurgitation of bitter/hot material into her throat. Also admit some increased belching. Denies fever, chills, unintentional weight loss, chest pain, shortness of breath. Takes Advil PM nearly every evening. Denies ASA powders. Denies any other upper or lower GI symptoms.  Past Medical History  Diagnosis Date  . Post concussion syndrome 1999  . 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 Cervical pain (neck)       chronic  . Hypothyroidism   . Anxiety   . Arthritis     right hip    Past Surgical History  Procedure Laterality Date  . Cervix surgery  10/11  . Cardiovascular stress test  03/12    normal  . Tubal ligation Bilateral   . Knee surgery Left 12/89  . Back surgery      cervical fusion  . Abdominal hysterectomy    . Cardiac catheterization  2014    mild CAD, no significant stenosis  . Colonoscopy with esophagogastroduodenoscopy (egd) N/A 10/15/2013    XKG:YJEHUDJ reflux esophagitis.  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 I suspect the patient had a recent enteric infection which was responsible for the CT findings  . Left heart catheterization with coronary angiogram N/A 07/29/2013    Procedure: LEFT HEART CATHETERIZATION WITH CORONARY ANGIOGRAM;  Surgeon: Sanda Klein, MD;  Location: [facility];  Service: Cardiovascular;  Laterality: N/A;    Current Outpatient Prescriptions  Medication Sig Dispense Refill  . ALPRAZolam (XANAX) 1 MG tablet TAKE 1 TABLET BY MOUTH TWICE DAILY 60 tablet 4  . aspirin 81 MG tablet Take 1 tablet (81 mg total) by mouth daily.    . Cholecalciferol (VITAMIN D3) 2000 UNITS TABS Take 2,000 Units by mouth daily.    Marland Kitchen levothyroxine (SYNTHROID, LEVOTHROID) 100 MCG tablet Take 1 tablet (100 mcg total) by mouth daily. 30 tablet 5  . metoprolol succinate (TOPROL-XL) 25 MG 24 hr tablet Take 1 tablet (25 mg total) by mouth daily. 30 tablet 10  . ondansetron (ZOFRAN) 8 MG tablet TAKE 1 TABLET BY MOUTH 3 TIMES A DAY AS NEEDED FOR  NAUSEA OR VOMITING 21 tablet 0  . oxyCODONE-acetaminophen (PERCOCET) 10-325 MG per tablet One q 4 hours prn , no greater than 5 per day, May fill 10/03/14 150 tablet 0  . pantoprazole (PROTONIX) 40 MG tablet Take 1 tablet (40 mg total) by mouth daily. 30 tablet 6  . traZODone (DESYREL) 100 MG tablet Take 1.5 tablets (150 mg total) by mouth at bedtime. 45 tablet 3  . azithromycin (ZITHROMAX  Z-PAK) 250 MG tablet Take 2 tablets (500 mg) on  Day 1,  followed by 1 tablet (250 mg) once daily on Days 2 through 5. (Patient not taking: Reported on 03/09/2015) 6 each 0   No current facility-administered medications for this visit.    Allergies as of 03/09/2015 - Review Complete 03/09/2015  Allergen Reaction Noted  . Adhesive [tape]  07/08/2013  . Cymbalta [duloxetine hcl] Other (See Comments) 02/22/2013  . Ivp dye [iodinated diagnostic agents]  07/27/2013  . Other  07/08/2013    Family History  Problem Relation Age of Onset  . Coronary artery disease Mother 43    MI  . Colon cancer Mother     7s, living    History   Social History  . Marital Status: Married    Spouse Name: [name]  . Number of Children: 3  . Years of Education: 13   Occupational History  .  [employer]   Social History Main Topics  . Smoking status: Current Every Day Smoker -- 1.00 packs/day for 30 years    Types: Cigarettes  . Smokeless tobacco: Never Used  . Alcohol Use: No  . Drug Use: No  . Sexual Activity: Yes    Birth Control/ Protection: Surgical   Other Topics Concern  . None   Social History Narrative   Patient is married [name]) and lives at home with her husband.   Patient has three children.   Patient is currently not working.   Patient is right-handed.   Patient has a college education.   Patient drinks about four sodas daily.    Review of Systems: Gen: Denies fever, chills, anorexia. Denies fatigue, weakness, weight loss.  CV: Denies chest pain, palpitations, syncope, peripheral edema. Resp: Denies dyspnea at rest, wheezing, coughing up blood. GI: See HPI. Denies vomiting blood, jaundice.   Denies dysphagia or odynophagia. Derm: Denies rash, itching, dry skin Psych: Denies depression, anxiety, memory loss, confusion. Heme: Denies bruising, bleeding, and enlarged lymph nodes.  Physical Exam: BP 106/61 mmHg  Pulse 81  Temp(Src) 96.7 F (35.9 C)  Ht 5\' 7"  (1.702 m)   Wt 165 lb 6.4 oz (75.025 kg)  BMI 25.90 kg/m2 General:   Alert and oriented. No distress noted. Pleasant and cooperative.  Head:  Normocephalic and atraumatic. Eyes:  Conjuctiva clear without scleral icterus. Neck:  Supple, without mass or thyromegaly. Lungs:  Clear to auscultation bilaterally. No wheezes, rales, or rhonchi. No distress.  Heart:  S1, S2 present without murmurs, rubs, or gallops. Regular rate and rhythm. Abdomen:  +BS, soft, and non-distended. Mild to moderate epigastric pain to palpation. No rebound or guarding. No HSM or masses noted. Msk:  Symmetrical without gross deformities. Normal posture. Pulses:  2+ DP noted bilaterally Extremities:  Without edema. Neurologic:  Alert and  oriented x4;  grossly normal neurologically. Skin:  Intact without significant lesions or rashes. Psych:  Alert and cooperative. Normal mood and affect.    03/09/2015 11:27 AM

## 2015-03-29 ENCOUNTER — Encounter: Payer: Self-pay | Admitting: Family Medicine

## 2015-03-29 ENCOUNTER — Ambulatory Visit (INDEPENDENT_AMBULATORY_CARE_PROVIDER_SITE_OTHER): Payer: BLUE CROSS/BLUE SHIELD | Admitting: Family Medicine

## 2015-03-29 VITALS — BP 106/70 | Temp 98.0°F | Ht 67.0 in | Wt 158.0 lb

## 2015-03-29 DIAGNOSIS — J200 Acute bronchitis due to Mycoplasma pneumoniae: Secondary | ICD-10-CM

## 2015-03-29 DIAGNOSIS — E038 Other specified hypothyroidism: Secondary | ICD-10-CM | POA: Diagnosis not present

## 2015-03-29 MED ORDER — AZITHROMYCIN 250 MG PO TABS: ORAL_TABLET | ORAL | Status: DC

## 2015-03-29 MED ORDER — PREDNISONE 20 MG PO TABS: ORAL_TABLET | ORAL | Status: DC

## 2015-03-29 NOTE — Progress Notes (Signed)
   Subjective:    Patient ID: Melanie Shepard, female    DOB: Jan 11, 1966, 49 y.o.   MRN: [id number]  Cough This is a new problem. The current episode started 1 to 4 weeks ago. Associated symptoms include headaches and sweats. Treatments tried: tylenol.   Patient relates fatigue tiredness denies high fevers chills sweats denies wheezing   Review of Systems  Respiratory: Positive for cough.   Neurological: Positive for headaches.       Objective:   Physical Exam Bilateral deep cough noted but no sign and pneumonia or respiratory distress wrote normal neck supple subjected discomfort in her neck which is chronic patient not toxic       Assessment & Plan:  Viral syndrome Secondary bronchitis Antibiotics prescribed because of underlying tendency toward COPD plus progressive cough X-ray not ordered today but if worse over the next week or recommend x-rays lab work Follow-up for chronic pain that she has in her neck as well as thyroid issues she will do her lab work

## 2015-04-05 DIAGNOSIS — Z029 Encounter for administrative examinations, unspecified: Secondary | ICD-10-CM

## 2015-04-19 ENCOUNTER — Ambulatory Visit (INDEPENDENT_AMBULATORY_CARE_PROVIDER_SITE_OTHER): Payer: BLUE CROSS/BLUE SHIELD | Admitting: Family Medicine

## 2015-04-19 ENCOUNTER — Encounter: Payer: Self-pay | Admitting: Family Medicine

## 2015-04-19 VITALS — BP 110/72 | Ht 67.0 in | Wt 160.0 lb

## 2015-04-19 DIAGNOSIS — G8929 Other chronic pain: Secondary | ICD-10-CM | POA: Diagnosis not present

## 2015-04-19 DIAGNOSIS — M542 Cervicalgia: Secondary | ICD-10-CM

## 2015-04-19 DIAGNOSIS — G894 Chronic pain syndrome: Secondary | ICD-10-CM

## 2015-04-19 DIAGNOSIS — E038 Other specified hypothyroidism: Secondary | ICD-10-CM

## 2015-04-19 MED ORDER — OXYCODONE-ACETAMINOPHEN 10-325 MG PO TABS: ORAL_TABLET | ORAL | Status: DC
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
## 2015-04-20 LAB — T4, FREE: FREE T4: 1.03 ng/dL (ref 0.82–1.77)

## 2015-04-20 LAB — TSH: TSH: 8.39 u[IU]/mL — AB (ref 0.450–4.500)

## 2015-04-22 MED ORDER — LEVOTHYROXINE SODIUM 125 MCG PO TABS: 125.0000 ug | ORAL_TABLET | Freq: Every day | ORAL | Status: DC

## 2015-04-22 MED ORDER — LEVOTHYROXINE SODIUM 125 MCG PO TABS: 100.0000 ug | ORAL_TABLET | Freq: Every day | ORAL | Status: DC

## 2015-04-22 NOTE — Addendum Note (Signed)
Addended by: Carmelina Noun on: 04/22/2015 03:52 PM   Modules accepted: Orders, Medications

## 2015-04-26 ENCOUNTER — Encounter: Payer: Self-pay | Admitting: Internal Medicine

## 2015-05-06 ENCOUNTER — Telehealth: Payer: Self-pay | Admitting: Family Medicine

## 2015-05-06 ENCOUNTER — Other Ambulatory Visit: Payer: Self-pay | Admitting: Cardiovascular Disease

## 2015-05-06 MED ORDER — ONDANSETRON 4 MG PO TBDP: 4.0000 mg | ORAL_TABLET | Freq: Four times a day (QID) | ORAL | Status: DC | PRN

## 2015-05-06 NOTE — Telephone Encounter (Signed)
Per Dr. Richardson Landry- Zofran 4 mg ODT 1 tab q 6 hours prn # 24, 2 RFs. Med sent to pharmacy and patient was notified.

## 2015-05-06 NOTE — Telephone Encounter (Signed)
Patient is requesting Rx for nausea due to a stomach issue that she has.

## 2015-05-14 ENCOUNTER — Ambulatory Visit: Payer: BLUE CROSS/BLUE SHIELD | Admitting: Gastroenterology

## 2015-05-26 ENCOUNTER — Ambulatory Visit: Payer: BLUE CROSS/BLUE SHIELD | Admitting: Gastroenterology

## 2015-06-08 ENCOUNTER — Encounter: Payer: Self-pay | Admitting: Gastroenterology

## 2015-06-08 ENCOUNTER — Other Ambulatory Visit: Payer: Self-pay

## 2015-06-08 ENCOUNTER — Ambulatory Visit (INDEPENDENT_AMBULATORY_CARE_PROVIDER_SITE_OTHER): Payer: BLUE CROSS/BLUE SHIELD | Admitting: Gastroenterology

## 2015-06-08 ENCOUNTER — Telehealth: Payer: Self-pay | Admitting: Family Medicine

## 2015-06-08 VITALS — BP 114/78 | HR 94 | Temp 98.3°F | Ht 67.0 in | Wt 163.8 lb

## 2015-06-08 DIAGNOSIS — R1314 Dysphagia, pharyngoesophageal phase: Secondary | ICD-10-CM

## 2015-06-08 DIAGNOSIS — R1013 Epigastric pain: Secondary | ICD-10-CM | POA: Diagnosis not present

## 2015-06-08 MED ORDER — PANTOPRAZOLE SODIUM 40 MG PO TBEC: 40.0000 mg | DELAYED_RELEASE_TABLET | Freq: Two times a day (BID) | ORAL | Status: DC

## 2015-06-08 NOTE — Assessment & Plan Note (Signed)
Likely related to esophagitis, possible web, ring, stricture. Less likely motility disorder. Dilation as appropriate.

## 2015-06-08 NOTE — Assessment & Plan Note (Signed)
49 year old female with epigastric pain, regurgitation, solid/liquid dysphagia, and associated nausea in the setting of daily NSAID use (advil pm), with only slight improvement changing to Dexilant from Protonix. Known erosive esophagitis on EGD in 2014. As of note, gallbladder absent. Does not appear acutely ill at time of appointment. Would recommend EGD with +/- dilation in near future, with consideration for gastric emptying study if persistent nausea to assess for gastroparesis. Hold off on imaging currently, as physical exam is without concerning findings. Highly recommend and requested that patient avoid NSAIDs.   Due to failed conscious sedation during last procedure, will proceed with Propofol under anesthesia care. Proceed with upper endoscopy +/- dilation in the near future with Dr. Gala Romney. The risks, benefits, and alternatives have been discussed in detail with patient. They have stated understanding and desire to proceed.  Increase Protonix to BID; stop Dexilant due to cost.  GERD diet handout provided

## 2015-06-08 NOTE — Telephone Encounter (Signed)
Pt on quite hi dose of narcotics and seen on 5 - 9 and given two thirty day rx's. This is the tyoe of pt we must try to sched f u for when they are leaving the building, o v with dr scott next wk before she runs out

## 2015-06-08 NOTE — Telephone Encounter (Signed)
Pt is needing a refill on her medications. Pt will be out before her appointment on 7/26

## 2015-06-08 NOTE — Progress Notes (Signed)
CC'ED TO PCP 

## 2015-06-08 NOTE — Patient Instructions (Addendum)
Stop Dexilant. I have increased Protonix to twice a day, 30 minutes before breakfast and dinner.   I have included a handout on reflux.   Stop adding any extra salt to your food. Limit your carbonated drinks and sodas to minimal/none. This may be contributing to both your reflux symptoms and your swelling.   We have scheduled you for an upper endoscopy and dilation with Dr. Gala Romney in the near future!  Please only have clear liquids the day prior to your procedure.   Gastroesophageal Reflux Disease, Adult Gastroesophageal reflux disease (GERD) happens when acid from your stomach flows up into the esophagus. When acid comes in contact with the esophagus, the acid causes soreness (inflammation) in the esophagus. Over time, GERD may create small holes (ulcers) in the lining of the esophagus. CAUSES   Increased body weight. This puts pressure on the stomach, making acid rise from the stomach into the esophagus.  Smoking. This increases acid production in the stomach.  Drinking alcohol. This causes decreased pressure in the lower esophageal sphincter (valve or ring of muscle between the esophagus and stomach), allowing acid from the stomach into the esophagus.  Late evening meals and a full stomach. This increases pressure and acid production in the stomach.  A malformed lower esophageal sphincter. Sometimes, no cause is found. SYMPTOMS   Burning pain in the lower part of the mid-chest behind the breastbone and in the mid-stomach area. This may occur twice a week or more often.  Trouble swallowing.  Sore throat.  Dry cough.  Asthma-like symptoms including chest tightness, shortness of breath, or wheezing. DIAGNOSIS  Your caregiver may be able to diagnose GERD based on your symptoms. In some cases, X-rays and other tests may be done to check for complications or to check the condition of your stomach and esophagus. TREATMENT  Your caregiver may recommend over-the-counter or prescription  medicines to help decrease acid production. Ask your caregiver before starting or adding any new medicines.  HOME CARE INSTRUCTIONS   Change the factors that you can control. Ask your caregiver for guidance concerning weight loss, quitting smoking, and alcohol consumption.  Avoid foods and drinks that make your symptoms worse, such as:  Caffeine or alcoholic drinks.  Chocolate.  Peppermint or mint flavorings.  Garlic and onions.  Spicy foods.  Citrus fruits, such as oranges, lemons, or limes.  Tomato-based foods such as sauce, chili, salsa, and pizza.  Fried and fatty foods.  Avoid lying down for the 3 hours prior to your bedtime or prior to taking a nap.  Eat small, frequent meals instead of large meals.  Wear loose-fitting clothing. Do not wear anything tight around your waist that causes pressure on your stomach.  Raise the head of your bed 6 to 8 inches with wood blocks to help you sleep. Extra pillows will not help.  Only take over-the-counter or prescription medicines for pain, discomfort, or fever as directed by your caregiver.  Do not take aspirin, ibuprofen, or other nonsteroidal anti-inflammatory drugs (NSAIDs). SEEK IMMEDIATE MEDICAL CARE IF:   You have pain in your arms, neck, jaw, teeth, or back.  Your pain increases or changes in intensity or duration.  You develop nausea, vomiting, or sweating (diaphoresis).  You develop shortness of breath, or you faint.  Your vomit is green, yellow, black, or looks like coffee grounds or blood.  Your stool is red, bloody, or black. These symptoms could be signs of other problems, such as heart disease, gastric bleeding, or esophageal bleeding.  MAKE SURE YOU:   Understand these instructions.  Will watch your condition.  Will get help right away if you are not doing well or get worse. Document Released: 09/06/2005 Document Revised: 02/19/2012 Document Reviewed: 06/16/2011 [facility] Patient Information 2015  [place], [place]. This information is not intended to replace advice given to you by your health care provider. Make sure you discuss any questions you have with your health care provider.

## 2015-06-08 NOTE — Progress Notes (Signed)
Referring Provider: Kathyrn Drown, MD Primary Care Physician:  Sallee Lange, MD  Primary GI: Dr. Gala Romney   Chief Complaint  Patient presents with  . Follow-up    HPI:   Melanie Shepard is a 49 y.o. female presenting today with a history of erosive esophagitis, with last EGD Nov 2014. Colonoscopy at that time as well as listed in [facility]. Last seen March 2016 with dyspepsia, associated nausea, symptomatic GERD. Changed from Protonix to [place]. Here to discuss symptoms. Gallbladder absent.   Dexilant helped with only some improvement. Notes LUQ discomfort intermittently, not associated with eating/drinking. Nausea intermittent. Was taking advil pm, changed to tylenol pm, now advil pm daily. 1 about every night. No melena. Notes esophageal dysphagia to "everything". Less liquid dysphagia. Symptoms present last few months. Has regurgitation, bile taste in mouth. Out of Zofran as she has to take it "alot".   AS OF NOTE, STATES SHE WOKE UP DURING COLONOSCOPY/EGD.   Notes pedal edema, hand edema. Calling for an appt with Dr. Wolfgang Phoenix. Putting a little salt on celery, otherwise no other dietary changes.  [place].     Past Medical History  Diagnosis Date  . Post concussion syndrome 1999  . 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 Cervical pain (neck)     chronic  . Hypothyroidism   . Anxiety   . Arthritis     right hip    Past Surgical History  Procedure Laterality Date  . Cervix surgery  10/11  . Cardiovascular stress test  03/12    normal  . Tubal ligation Bilateral   . Knee surgery Left 12/89  . Back surgery      cervical fusion  . Abdominal hysterectomy    . Cardiac catheterization  2014    mild CAD, no significant stenosis  . Colonoscopy with esophagogastroduodenoscopy (egd) N/A 10/15/2013    IZT:[id number] reflux esophagitis.  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 I suspect the patient had a recent enteric  infection which was responsible for the CT findings  . Left heart catheterization with coronary angiogram N/A 07/29/2013    Procedure: LEFT HEART CATHETERIZATION WITH CORONARY ANGIOGRAM;  Surgeon: Sanda Klein, MD;  Location: [facility];  Service: Cardiovascular;  Laterality: N/A;  . Tracheostomy  1999    emergent   . Cholecystectomy    . Appendectomy      Current Outpatient Prescriptions  Medication Sig Dispense Refill  . ALPRAZolam (XANAX) 1 MG tablet TAKE 1 TABLET BY MOUTH TWICE DAILY 60 tablet 4  . aspirin 81 MG tablet Take 1 tablet (81 mg total) by mouth daily.    . Cholecalciferol (VITAMIN D3) 2000 UNITS TABS Take 2,000 Units by mouth daily.    Marland Kitchen dexlansoprazole (DEXILANT) 60 MG capsule Take 1 capsule (60 mg total) by mouth daily. 30 capsule 2  . levothyroxine (SYNTHROID, LEVOTHROID) 125 MCG tablet Take 1 tablet (125 mcg total) by mouth daily. 30 tablet 3  . metoprolol succinate (TOPROL-XL) 25 MG 24 hr tablet Take 1 tablet (25 mg total) by mouth daily. APPOINTMENT NEEDED FOR FUTURE REFILLS 30 tablet 1  . ondansetron (ZOFRAN ODT) 4 MG disintegrating tablet Take 1 tablet (4 mg total) by mouth every 6 (six) hours as needed for nausea or vomiting. 24 tablet 2  . ondansetron (ZOFRAN) 4 MG tablet Take 1 tablet (4 mg total) by mouth every 8 (eight) hours as needed for nausea or vomiting. 30 tablet 1  . oxyCODONE-acetaminophen (  PERCOCET) 10-325 MG per tablet One q 4 hours prn , no greater than 5 per day, May fill 10/03/14 150 tablet 0  . traZODone (DESYREL) 100 MG tablet Take 1.5 tablets (150 mg total) by mouth at bedtime. 45 tablet 3   No current facility-administered medications for this visit.    Allergies as of 06/08/2015 - Review Complete 06/08/2015  Allergen Reaction Noted  . Adhesive [tape]  07/08/2013  . Cymbalta [duloxetine hcl] Other (See Comments) 02/22/2013  . Ivp dye [iodinated diagnostic agents]  07/27/2013  . Other  07/08/2013    Family History  Problem Relation Age  of Onset  . Coronary artery disease Mother 74    MI  . Colon cancer Mother     6s, living    History   Social History  . Marital Status: Married    Spouse Name: [name]  . Number of Children: 3  . Years of Education: 13   Occupational History  .  [employer]   Social History Main Topics  . Smoking status: Current Every Day Smoker -- 1.00 packs/day for 30 years    Types: Cigarettes  . Smokeless tobacco: Never Used  . Alcohol Use: No  . Drug Use: No  . Sexual Activity: Yes    Birth Control/ Protection: Surgical   Other Topics Concern  . None   Social History Narrative   Patient is married [name]) and lives at home with her husband.   Patient has three children.   Patient is currently not working.   Patient is right-handed.   Patient has a college education.   Patient drinks about four sodas daily.    Review of Systems: Gen: Denies fever, chills, anorexia. Denies fatigue, weakness, weight loss.  CV: +peripheral edema, mild  Resp: Denies dyspnea at rest, cough, wheezing, coughing up blood, and pleurisy. GI: see HPI Derm: Denies rash, itching, dry skin Psych: Denies depression, anxiety, memory loss, confusion. No homicidal or suicidal ideation.  Heme: Denies bruising, bleeding, and enlarged lymph nodes.  Physical Exam: BP 114/78 mmHg  Pulse 94  Temp(Src) 98.3 F (36.8 C) (Oral)  Ht 5\' 7"  (1.702 m)  Wt 163 lb 12.8 oz (74.299 kg)  BMI 25.65 kg/m2 General:   Alert and oriented. No distress noted. Pleasant and cooperative.  Head:  Normocephalic and atraumatic. Eyes:  Conjuctiva clear without scleral icterus. Mouth:  Oral mucosa pink and moist. Good dentition. No lesions. Heart:  S1, S2 present without murmurs, rubs, or gallops. Regular rate and rhythm. Abdomen:  +BS, soft, mild TTP LUQ/epigastric region and non-distended. No rebound or guarding. No HSM or masses noted. Msk:  Symmetrical without gross deformities. Normal posture. Extremities:  Mild pedal  edema Neurologic:  Alert and  oriented x4;  grossly normal neurologically. Psych:  Alert and cooperative. Normal mood and affect.

## 2015-06-09 NOTE — Telephone Encounter (Signed)
Patient transferred to front desk to schedule appointment.  

## 2015-06-09 NOTE — Telephone Encounter (Signed)
TCNA (vm box not setup) 

## 2015-06-15 ENCOUNTER — Other Ambulatory Visit: Payer: Self-pay

## 2015-06-15 ENCOUNTER — Encounter (HOSPITAL_COMMUNITY)
Admission: RE | Admit: 2015-06-15 | Discharge: 2015-06-15 | Disposition: A | Discharge status: Home / Self Care | Payer: BLUE CROSS/BLUE SHIELD | Source: Ambulatory Visit | Attending: Internal Medicine | Admitting: Internal Medicine

## 2015-06-15 ENCOUNTER — Encounter (HOSPITAL_COMMUNITY): Payer: Self-pay

## 2015-06-15 DIAGNOSIS — R1013 Epigastric pain: Secondary | ICD-10-CM | POA: Diagnosis not present

## 2015-06-15 DIAGNOSIS — Z01818 Encounter for other preprocedural examination: Secondary | ICD-10-CM | POA: Insufficient documentation

## 2015-06-15 DIAGNOSIS — R1310 Dysphagia, unspecified: Secondary | ICD-10-CM | POA: Diagnosis not present

## 2015-06-15 HISTORY — DX: Anemia, unspecified: D64.9

## 2015-06-15 HISTORY — DX: Gastro-esophageal reflux disease without esophagitis: K21.9

## 2015-06-15 HISTORY — DX: Essential (primary) hypertension: I10

## 2015-06-15 HISTORY — DX: Unspecified convulsions: R56.9

## 2015-06-15 LAB — CBC WITH DIFFERENTIAL/PLATELET
BASOS ABS: 0.1 10*3/uL (ref 0.0–0.1)
Basophils Relative: 1 % (ref 0–1)
Eosinophils Absolute: 1.1 10*3/uL — ABNORMAL HIGH (ref 0.0–0.7)
Eosinophils Relative: 8 % — ABNORMAL HIGH (ref 0–5)
HCT: 42 % (ref 36.0–46.0)
Hemoglobin: 14.1 g/dL (ref 12.0–15.0)
Lymphocytes Relative: 34 % (ref 12–46)
Lymphs Abs: 4.5 10*3/uL — ABNORMAL HIGH (ref 0.7–4.0)
MCH: 36.7 pg — ABNORMAL HIGH (ref 26.0–34.0)
MCHC: 33.6 g/dL (ref 30.0–36.0)
MCV: 109.4 fL — ABNORMAL HIGH (ref 78.0–100.0)
MONO ABS: 0.5 10*3/uL (ref 0.1–1.0)
Monocytes Relative: 4 % (ref 3–12)
NEUTROS ABS: 6.9 10*3/uL (ref 1.7–7.7)
NEUTROS PCT: 53 % (ref 43–77)
Platelets: 331 10*3/uL (ref 150–400)
RBC: 3.84 MIL/uL — ABNORMAL LOW (ref 3.87–5.11)
RDW: 15.4 % (ref 11.5–15.5)
WBC: 13.1 10*3/uL — ABNORMAL HIGH (ref 4.0–10.5)

## 2015-06-15 LAB — BASIC METABOLIC PANEL
ANION GAP: 11 (ref 5–15)
BUN: 5 mg/dL — ABNORMAL LOW (ref 6–20)
CALCIUM: 9.2 mg/dL (ref 8.9–10.3)
CHLORIDE: 98 mmol/L — AB (ref 101–111)
CO2: 26 mmol/L (ref 22–32)
CREATININE: 0.68 mg/dL (ref 0.44–1.00)
GFR calc Af Amer: >60 mL/min (ref >60)
GFR calc non Af Amer: >60 mL/min (ref >60)
Glucose, Bld: 133 mg/dL — ABNORMAL HIGH (ref 65–99)
Potassium: 3.8 mmol/L (ref 3.5–5.1)
Sodium: 135 mmol/L (ref 135–145)

## 2015-06-15 NOTE — Pre-Procedure Instructions (Signed)
Patient given information to sign up for my chart at home. 

## 2015-06-15 NOTE — Patient Instructions (Signed)
Melanie Shepard  06/15/2015     @PREFPERIOPPHARMACY @   Your procedure is scheduled on 06/24/2015   Report to [facility] at  1015  A.M.  Call this number if you have problems the morning of surgery:  [phone]   Remember:  Do not eat food or drink liquids after midnight.  Take these medicines the morning of surgery with A SIP OF WATER  Xanax, levothyroxine, metoprolol, zofran, oxycodone, protonix.   Do not wear jewelry, make-up or nail polish.  Do not wear lotions, powders, or perfumes.    Do not shave 48 hours prior to surgery.  Men may shave face and neck.  Do not bring valuables to the hospital.  [facility] is not responsible for any belongings or valuables.  Contacts, dentures or bridgework may not be worn into surgery.  Leave your suitcase in the car.  After surgery it may be brought to your room.  For patients admitted to the hospital, discharge time will be determined by your treatment team.  Patients discharged the day of surgery will not be allowed to drive home.   Name and phone number of your driver:   family Special instructions:  Follow your pre-procedure diet instructions from Dr Roseanne Kaufman office.  Please read over the following fact sheets that you were given. Pain Booklet, Coughing and Deep Breathing, Surgical Site Infection Prevention, Anesthesia Post-op Instructions and Care and Recovery After Surgery      Esophagogastroduodenoscopy Esophagogastroduodenoscopy (EGD) is a procedure to examine the lining of the esophagus, stomach, and first part of the small intestine (duodenum). A long, flexible, lighted tube with a camera attached (endoscope) is inserted down the throat to view these organs. This procedure is done to detect problems or abnormalities, such as inflammation, bleeding, ulcers, or growths, in order to treat them. The procedure lasts about 5-20 minutes. It is usually an outpatient procedure, but it may need to be performed in emergency cases  in the hospital. LET YOUR CAREGIVER KNOW ABOUT:   Allergies to food or medicine.  All medicines you are taking, including vitamins, herbs, eyedrops, and over-the-counter medicines and creams.  Use of steroids (by mouth or creams).  Previous problems you or members of your family have had with the use of anesthetics.  Any blood disorders you have.  Previous surgeries you have had.  Other health problems you have.  Possibility of pregnancy, if this applies. RISKS AND COMPLICATIONS  Generally, EGD is a safe procedure. However, as with any procedure, complications can occur. Possible complications include:  Infection.  Bleeding.  Tearing (perforation) of the esophagus, stomach, or duodenum.  Difficulty breathing or not being able to breath.  Excessive sweating.  Spasms of the larynx.  Slowed heartbeat.  Low blood pressure. BEFORE THE PROCEDURE  Do not eat or drink anything for 6-8 hours before the procedure or as directed by your caregiver.  Ask your caregiver about changing or stopping your regular medicines.  If you wear dentures, be prepared to remove them before the procedure.  Arrange for someone to drive you home after the procedure. PROCEDURE   A vein will be accessed to give medicines and fluids. A medicine to relax you (sedative) and a pain reliever will be given through that access into the vein.  A numbing medicine (local anesthetic) may be sprayed on your throat for comfort and to stop you from gagging or coughing.  A mouth guard may be placed in your mouth to  protect your teeth and to keep you from biting on the endoscope.  You will be asked to lie on your left side.  The endoscope is inserted down your throat and into the esophagus, stomach, and duodenum.  Air is put through the endoscope to allow your caregiver to view the lining of your esophagus clearly.  The esophagus, stomach, and duodenum is then examined. During the exam, your caregiver  may:  Remove tissue to be examined under a microscope (biopsy) for inflammation, infection, or other medical problems.  Remove growths.  Remove objects (foreign bodies) that are stuck.  Treat any bleeding with medicines or other devices that stop tissues from bleeding (hot cautery, clipping devices).  Widen (dilate) or stretch narrowed areas of the esophagus and stomach.  The endoscope will then be withdrawn. AFTER THE PROCEDURE  You will be taken to a recovery area to be monitored. You will be able to go home once you are stable and alert.  Do not eat or drink anything until the local anesthetic and numbing medicines have worn off. You may choke.  It is normal to feel bloated, have pain with swallowing, or have a sore throat for a short time. This will wear off.  Your caregiver should be able to discuss his or her findings with you. It will take longer to discuss the test results if any biopsies were taken. Document Released: 03/30/2005 Document Revised: 04/13/2014 Document Reviewed: 10/30/2012 [facility] Patient Information 2015 [place], [place]. This information is not intended to replace advice given to you by your health care provider. Make sure you discuss any questions you have with your health care provider. Esophageal Dilatation The esophagus is the long, narrow tube which carries food and liquid from the mouth to the stomach. Esophageal dilatation is the technique used to stretch a blocked or narrowed portion of the esophagus. This procedure is used when a part of the esophagus has become so narrow that it becomes difficult, painful or even impossible to swallow. This is generally an uncomplicated form of treatment. When this is not successful, chest surgery may be required. This is a much more extensive form of treatment with a longer recovery time. CAUSES  Some of the more common causes of blockage or strictures of the esophagus are:  Narrowing from longstanding inflammation  (soreness and redness) of the lower esophagus. This comes from the constant exposure of the lower esophagus to the acid which bubbles up from the stomach. Over time this causes scarring and narrowing of the lower esophagus.  Hiatal hernia in which a small part of the stomach bulges (herniates) up through the diaphragm. This can cause a gradual narrowing of the end of the esophagus.  Schatzki ring is a narrow ring of benign (non-cancerous) fibrous tissue which constricts the lower esophagus. The reason for this is not known.  Scleroderma is a connective tissue disorder that affects the esophagus and makes swallowing difficult.  Achalasia is an absence of nerves to the lower esophagus and to the esophageal sphincter. This is the circular muscle between the stomach and esophagus that relaxes to allow food into the stomach. After swallowing, it contracts to keep food in the stomach. This absence of nerves may be congenital (present since birth). This can cause irregular spasms of the lower esophageal muscle. This spasm does not open up to allow food and fluid through. The result is a persistent blockage with subsequent slow trickling of the esophageal contents into the stomach.  Strictures may develop from swallowing  materials which damage the esophagus. Some examples are strong acids or alkalis such as lye.  Growths such as benign (non-cancerous) and malignant (cancerous) tumors can block the esophagus.  Hereditary (present since birth) causes. DIAGNOSIS  Your caregiver often suspects this problem by taking a medical history. They will also do a physical exam. They can then prove their suspicions using X-rays and endoscopy. Endoscopy is an exam in which a tube like a small, flexible telescope is used to look at your esophagus.  TREATMENT There are different stretching (dilating) techniques that can be used. Simple bougie dilatation may be done in the office. This usually takes only a couple minutes. A  numbing (anesthetic) spray of the throat is used. Endoscopy, when done, is done in an endoscopy suite under mild sedation. When fluoroscopy is used, the procedure is performed in X-ray. Other techniques require a little longer time. Recovery is usually quick. There is no waiting time to begin eating and drinking to test success of the treatment. Following are some of the methods used. Narrowing of the esophagus is treated by making it bigger. Commonly this is a mechanical problem which can be treated with stretching. This can be done in different ways. Your caregiver will discuss these with you. Some of the means used are:  A series of graduated (increasing thickness) flexible dilators can be used. These are weighted tubes passed through the esophagus into the stomach. The tubes used become progressively larger until the desired stretched size is reached. Graduated dilators are a simple and quick way of opening the esophagus. No visualization is required.  Another method is the use of endoscopy to place a flexible wire across the stricture. The endoscope is removed and the wire left in place. A dilator with a hole through it from end to end is guided down the esophagus and across the stricture. One or more of these dilators are passed over the wire. At the end of the exam, the wire is removed. This type of treatment may be performed in the X-ray department under fluoroscopy. An advantage of this procedure is the examiner is visualizing the end opening in the esophagus.  Stretching of the esophagus may be done using balloons. Deflated balloons are placed through the endoscope and across the stricture. This type of balloon dilatation is often done at the time of endoscopy or fluoroscopy. Flexible endoscopy allows the examiner to directly view the stricture. A balloon is inserted in the deflated form into the area of narrowing. It is then inflated with air to a certain pressure that is preset for a given  circumference. When inflated, it becomes sausage shaped, stretched, and makes the stricture larger.  Achalasia requires a longer, larger balloon-type dilator. This is frequently done under X-ray control. In this situation, the spastic muscle fibers in the lower esophagus are stretched. All of the above procedures make the passage of food and water into the stomach easier. They also make it easier for stomach contents to reflux back into the esophagus. Special medications may be used following the procedure to help prevent further stricturing. Proton-pump inhibitor medications are good at decreasing the amount of acid in the stomach juice. When stomach juice refluxes into the esophagus, the juice is no longer as acidic and is less likely to burn or scar the esophagus. RISKS AND COMPLICATIONS Esophageal dilatation is usually performed effectively and without problems. Some complications that can occur are:  A small amount of bleeding almost always happens where the stretching takes place.  If this is too excessive it may require more aggressive treatment.  An uncommon complication is perforation (making a hole) of the esophagus. The esophagus is thin. It is easy to make a hole in it. If this happens, an operation may be necessary to repair this.  A small, undetected perforation could lead to an infection in the chest. This can be very serious. HOME CARE INSTRUCTIONS   If you received sedation for your procedure, do not drive, make important decisions, or perform any activities requiring your full coordination. Do not drink alcohol, take sedatives, or use any mind altering chemicals unless instructed by your caregiver.  You may use throat lozenges or warm salt water gargles if you have throat discomfort.  You can begin eating and drinking normally on return home unless instructed otherwise. Do not purposely try to force large chunks of food down to test the benefits of your procedure.  Mild  discomfort can be eased with sips of ice water.  Medications for discomfort may or may not be needed. SEEK IMMEDIATE MEDICAL CARE IF:   You begin vomiting up blood.  You develop black, tarry stools.  You develop chills or an unexplained temperature of over 101F (38.3C)  You develop chest or abdominal pain.  You develop shortness of breath, or feel light-headed or faint.  Your swallowing is becoming more painful, difficult, or you are unable to swallow. MAKE SURE YOU:   Understand these instructions.  Will watch your condition.  Will get help right away if you are not doing well or get worse. Document Released: 01/18/2006 Document Revised: 04/13/2014 Document Reviewed: 03/07/2006 [facility] Patient Information 2015 [place], [place]. This information is not intended to replace advice given to you by your health care provider. Make sure you discuss any questions you have with your health care provider. PATIENT INSTRUCTIONS POST-ANESTHESIA  IMMEDIATELY FOLLOWING SURGERY:  Do not drive or operate machinery for the first twenty four hours after surgery.  Do not make any important decisions for twenty four hours after surgery or while taking narcotic pain medications or sedatives.  If you develop intractable nausea and vomiting or a severe headache please notify your doctor immediately.  FOLLOW-UP:  Please make an appointment with your surgeon as instructed. You do not need to follow up with anesthesia unless specifically instructed to do so.  WOUND CARE INSTRUCTIONS (if applicable):  Keep a dry clean dressing on the anesthesia/puncture wound site if there is drainage.  Once the wound has quit draining you may leave it open to air.  Generally you should leave the bandage intact for twenty four hours unless there is drainage.  If the epidural site drains for more than 36-48 hours please call the anesthesia department.  QUESTIONS?:  Please feel free to call your physician or the hospital  operator if you have any questions, and they will be happy to assist you.

## 2015-06-16 NOTE — Progress Notes (Signed)
Quick Note:  Glucose elevated. Looks like she has mild leukocytosis. When she was seen in 2014, she had bumps to the 19 range. She may need further evaluation with hematology if this persists. Please cc to PCP. ______

## 2015-06-18 ENCOUNTER — Ambulatory Visit (INDEPENDENT_AMBULATORY_CARE_PROVIDER_SITE_OTHER): Payer: BLUE CROSS/BLUE SHIELD | Admitting: Family Medicine

## 2015-06-18 ENCOUNTER — Encounter: Payer: Self-pay | Admitting: Family Medicine

## 2015-06-18 VITALS — BP 118/70 | Ht 67.0 in | Wt 162.0 lb

## 2015-06-18 DIAGNOSIS — M542 Cervicalgia: Secondary | ICD-10-CM

## 2015-06-18 DIAGNOSIS — Z79891 Long term (current) use of opiate analgesic: Secondary | ICD-10-CM | POA: Diagnosis not present

## 2015-06-18 DIAGNOSIS — G8929 Other chronic pain: Secondary | ICD-10-CM

## 2015-06-18 DIAGNOSIS — E038 Other specified hypothyroidism: Secondary | ICD-10-CM

## 2015-06-18 DIAGNOSIS — D72829 Elevated white blood cell count, unspecified: Secondary | ICD-10-CM

## 2015-06-18 DIAGNOSIS — G4700 Insomnia, unspecified: Secondary | ICD-10-CM

## 2015-06-18 MED ORDER — ALPRAZOLAM 0.5 MG PO TABS: ORAL_TABLET | ORAL | Status: DC

## 2015-06-18 MED ORDER — OXYCODONE-ACETAMINOPHEN 10-325 MG PO TABS: ORAL_TABLET | ORAL | Status: DC

## 2015-06-18 NOTE — Progress Notes (Signed)
   Subjective:    Patient ID: Melanie Shepard, female    DOB: Jun 05, 1966, 49 y.o.   MRN: [id number]  HPI  This patient was seen today for chronic pain  The medication list was reviewed and updated.   -Compliance with pain medication: yes  The patient was advised the importance of maintaining medication and not using illegal substances with these.  Refills needed: yes  The patient was educated that we can provide 3 monthly scripts for their medication, it is their responsibility to follow the instructions.  Side effects or complications from medications: none  Patient is aware that pain medications are meant to minimize the severity of the pain to allow their pain levels to improve to allow for better function. They are aware of that pain medications cannot totally remove their pain.  Due for UDT ( at least once per year) : today  Patient has history of reflux she is on medication she will have EGD in the near future  She has chronic anxiety issues and get stressed out. Uses Xanax for this but patient was talked to at length about the need to gradually reduce the dosage  Patient has history of hypothyroidism in the past was not taking her medicine correctly but she is now she will recheck her thyroid function  She denies any tachycardia she continues metoprolol  She has chronic insomnia she states she gets stressed easily difficult to sleep trazodone has been prescribed in the past may use Benadryl with this    Review of Systems  Constitutional: Negative for activity change, appetite change and fatigue.  Gastrointestinal: Negative for vomiting and abdominal pain.  Neurological: Negative for weakness and headaches.  Psychiatric/Behavioral: Negative for behavioral problems and confusion.       Objective:   Physical Exam  Constitutional: She appears well-nourished. No distress.  HENT:  Head: Normocephalic.  Cardiovascular: Normal rate, regular rhythm and normal heart sounds.     No murmur heard. Pulmonary/Chest: Effort normal and breath sounds normal.  Musculoskeletal: She exhibits no edema.  Lymphadenopathy:    She has no cervical adenopathy.  Neurological: She is alert.  Psychiatric: Her behavior is normal.  Vitals reviewed.         Assessment & Plan:  We discussed chronic pain management of her neck she uses pain medicine up to 5 times per day. Pain contract reviewed and signed questions answered. Urine drug screen ordered. Prescriptions written.  Patient was informed that Xanax increases her risk of accidental overdose it was recommended that we taper her down toward 0.5 mg twice a day and not to use a medicine at bedtime  Continue trazodone at night to help with sleep if necessary Benadryl  Thyroid check TSH continue medication await the results  Patient encouraged quit smoking  Patient is disabled unable to work currently her disability cases pending

## 2015-06-19 LAB — T4, FREE: Free T4: 1.2 ng/dL (ref 0.82–1.77)

## 2015-06-19 LAB — TSH: TSH: 11.29 u[IU]/mL — ABNORMAL HIGH (ref 0.450–4.500)

## 2015-06-24 ENCOUNTER — Ambulatory Visit (HOSPITAL_COMMUNITY)
Admission: RE | Admit: 2015-06-24 | Discharge: 2015-06-24 | Disposition: A | Discharge status: Home / Self Care | Payer: BLUE CROSS/BLUE SHIELD | Source: Ambulatory Visit | Attending: Internal Medicine | Admitting: Internal Medicine

## 2015-06-24 ENCOUNTER — Ambulatory Visit (HOSPITAL_COMMUNITY): Payer: BLUE CROSS/BLUE SHIELD | Admitting: Anesthesiology

## 2015-06-24 ENCOUNTER — Encounter (HOSPITAL_COMMUNITY): Payer: Self-pay | Admitting: *Deleted

## 2015-06-24 ENCOUNTER — Encounter (HOSPITAL_COMMUNITY)
Admission: RE | Disposition: A | Discharge status: Home / Self Care | Payer: Self-pay | Source: Ambulatory Visit | Attending: Internal Medicine

## 2015-06-24 DIAGNOSIS — R1310 Dysphagia, unspecified: Secondary | ICD-10-CM | POA: Diagnosis present

## 2015-06-24 DIAGNOSIS — K219 Gastro-esophageal reflux disease without esophagitis: Secondary | ICD-10-CM | POA: Diagnosis not present

## 2015-06-24 DIAGNOSIS — Z800 Family history of malignant neoplasm of digestive organs: Secondary | ICD-10-CM | POA: Diagnosis not present

## 2015-06-24 DIAGNOSIS — Z888 Allergy status to other drugs, medicaments and biological substances status: Secondary | ICD-10-CM | POA: Insufficient documentation

## 2015-06-24 DIAGNOSIS — R1013 Epigastric pain: Secondary | ICD-10-CM | POA: Diagnosis not present

## 2015-06-24 DIAGNOSIS — F419 Anxiety disorder, unspecified: Secondary | ICD-10-CM | POA: Insufficient documentation

## 2015-06-24 DIAGNOSIS — Z7982 Long term (current) use of aspirin: Secondary | ICD-10-CM | POA: Insufficient documentation

## 2015-06-24 DIAGNOSIS — D649 Anemia, unspecified: Secondary | ICD-10-CM | POA: Diagnosis not present

## 2015-06-24 DIAGNOSIS — E039 Hypothyroidism, unspecified: Secondary | ICD-10-CM | POA: Insufficient documentation

## 2015-06-24 DIAGNOSIS — R1314 Dysphagia, pharyngoesophageal phase: Secondary | ICD-10-CM | POA: Insufficient documentation

## 2015-06-24 DIAGNOSIS — Z91041 Radiographic dye allergy status: Secondary | ICD-10-CM | POA: Insufficient documentation

## 2015-06-24 DIAGNOSIS — I10 Essential (primary) hypertension: Secondary | ICD-10-CM | POA: Diagnosis not present

## 2015-06-24 DIAGNOSIS — F17210 Nicotine dependence, cigarettes, uncomplicated: Secondary | ICD-10-CM | POA: Diagnosis not present

## 2015-06-24 DIAGNOSIS — M1990 Unspecified osteoarthritis, unspecified site: Secondary | ICD-10-CM | POA: Insufficient documentation

## 2015-06-24 HISTORY — PX: BIOPSY: SHX5522

## 2015-06-24 HISTORY — PX: MALONEY DILATION: SHX5535

## 2015-06-24 HISTORY — PX: ESOPHAGOGASTRODUODENOSCOPY (EGD) WITH PROPOFOL: SHX5813

## 2015-06-24 LAB — TOXASSURE SELECT 13 (MW), URINE: PDF: 0

## 2015-06-24 SURGERY — ESOPHAGOGASTRODUODENOSCOPY (EGD) WITH PROPOFOL
Anesthesia: Monitor Anesthesia Care

## 2015-06-24 MED ORDER — DEXAMETHASONE SODIUM PHOSPHATE 4 MG/ML IJ SOLN
4.0000 mg | Freq: Once | INTRAMUSCULAR | Status: AC
  Administered 2015-06-24: 4 mg via INTRAVENOUS
  Filled 2015-06-24: qty 1

## 2015-06-24 MED ORDER — MIDAZOLAM HCL 2 MG/2ML IJ SOLN
1.0000 mg | INTRAMUSCULAR | Status: DC | PRN
  Administered 2015-06-24: 2 mg via INTRAVENOUS
  Filled 2015-06-24: qty 2

## 2015-06-24 MED ORDER — LIDOCAINE VISCOUS 2 % MT SOLN
15.0000 mL | Freq: Two times a day (BID) | OROMUCOSAL | Status: DC | PRN
  Administered 2015-06-24: 15 mL via OROMUCOSAL

## 2015-06-24 MED ORDER — ONDANSETRON HCL 4 MG/2ML IJ SOLN
4.0000 mg | Freq: Once | INTRAMUSCULAR | Status: AC
  Administered 2015-06-24: 4 mg via INTRAVENOUS
  Filled 2015-06-24: qty 2

## 2015-06-24 MED ORDER — FENTANYL CITRATE (PF) 100 MCG/2ML IJ SOLN
25.0000 ug | INTRAMUSCULAR | Status: AC
  Administered 2015-06-24 (×2): 25 ug via INTRAVENOUS
  Filled 2015-06-24: qty 2

## 2015-06-24 MED ORDER — LACTATED RINGERS IV SOLN
INTRAVENOUS | Status: DC
  Administered 2015-06-24: 11:00:00 via INTRAVENOUS

## 2015-06-24 MED ORDER — PROPOFOL 10 MG/ML IV BOLUS
INTRAVENOUS | Status: AC
  Filled 2015-06-24: qty 20

## 2015-06-24 MED ORDER — STERILE WATER FOR IRRIGATION IR SOLN
Status: DC | PRN
  Administered 2015-06-24: 12:00:00

## 2015-06-24 MED ORDER — LIDOCAINE VISCOUS 2 % MT SOLN
OROMUCOSAL | Status: AC
  Filled 2015-06-24: qty 15

## 2015-06-24 MED ORDER — LIDOCAINE VISCOUS 2 % MT SOLN
15.0000 mL | Freq: Once | OROMUCOSAL | Status: AC
  Administered 2015-06-24: 15 mL via OROMUCOSAL

## 2015-06-24 MED ORDER — PROPOFOL INFUSION 10 MG/ML OPTIME
INTRAVENOUS | Status: DC | PRN
  Administered 2015-06-24: 100 ug/kg/min via INTRAVENOUS

## 2015-06-24 MED ORDER — FENTANYL CITRATE (PF) 100 MCG/2ML IJ SOLN: 25.0000 ug | INTRAMUSCULAR | Status: DC | PRN

## 2015-06-24 MED ORDER — MIDAZOLAM HCL 5 MG/5ML IJ SOLN
INTRAMUSCULAR | Status: DC | PRN
  Administered 2015-06-24: 2 mg via INTRAVENOUS

## 2015-06-24 MED ORDER — ONDANSETRON HCL 4 MG/2ML IJ SOLN: 4.0000 mg | Freq: Once | INTRAMUSCULAR | Status: DC | PRN

## 2015-06-24 SURGICAL SUPPLY — 20 items
BLOCK BITE 60FR ADLT L/F BLUE (MISCELLANEOUS) ×3 IMPLANT
DEVICE CLIP HEMOSTAT 235CM (CLIP) IMPLANT
ELECT REM PT RETURN 9FT ADLT (ELECTROSURGICAL)
ELECTRODE REM PT RTRN 9FT ADLT (ELECTROSURGICAL) IMPLANT
FLOOR PAD 36X40 (MISCELLANEOUS)
FORCEPS BIOP RAD 4 LRG CAP 4 (CUTTING FORCEPS) ×3 IMPLANT
FORMALIN 10 PREFIL 20ML (MISCELLANEOUS) ×3 IMPLANT
KIT ENDO PROCEDURE PEN (KITS) ×3 IMPLANT
MANIFOLD NEPTUNE II (INSTRUMENTS) ×3 IMPLANT
NEEDLE SCLEROTHERAPY 25GX240 (NEEDLE) IMPLANT
PAD FLOOR 36X40 (MISCELLANEOUS) IMPLANT
PROBE APC STR FIRE (PROBE) IMPLANT
PROBE INJECTION GOLD (MISCELLANEOUS)
PROBE INJECTION GOLD 7FR (MISCELLANEOUS) IMPLANT
SNARE ROTATE MED OVAL 20MM (MISCELLANEOUS) IMPLANT
SNARE SHORT THROW 13M SML OVAL (MISCELLANEOUS) IMPLANT
SYR 50ML LL SCALE MARK (SYRINGE) ×3 IMPLANT
SYR INFLATION 60ML (SYRINGE) ×3 IMPLANT
TUBING IRRIGATION ENDOGATOR (MISCELLANEOUS) ×3 IMPLANT
WATER STERILE IRR 1000ML POUR (IV SOLUTION) ×3 IMPLANT

## 2015-06-24 NOTE — Op Note (Signed)
[facility] [address] [place], [address]   ENDOSCOPY PROCEDURE REPORT  PATIENT: Melanie Shepard, Melanie Shepard  MR#: [id number] BIRTHDATE: 1966-07-30 , 48  yrs. old GENDER: female ENDOSCOPIST: R.  Garfield Cornea, MD FACP FACG REFERRED BY:  Sallee Lange, M.D. PROCEDURE DATE:  13-Jul-2015 PROCEDURE:  EGD w/ biopsy and Maloney dilation of esophagus INDICATIONS:  Esophageal dysphagia. MEDICATIONS: Deep sedation per Dr.  [facility] ASA CLASS:      Class II  CONSENT: The risks, benefits, limitations, alternatives and imponderables have been discussed.  The potential for biopsy, esophogeal dilation, etc. have also been reviewed.  Questions have been answered.  All parties agreeable.  Please see the history and physical in the medical record for more information.  DESCRIPTION OF PROCEDURE: After the risks benefits and alternatives of the procedure were thoroughly explained, informed consent was obtained.  The    endoscope was introduced through the mouth and advanced to the second portion of the duodenum , limited by Without limitations. The instrument was slowly withdrawn as the mucosa was fully examined. Estimated blood loss is zero unless otherwise noted in this procedure report.    Normal-appearing, patent tubular esophagus although it appears to have a bit of a ringed appearance distally when decompressed. Stomach empty.  Normal gastric mucosa.  Patent pylorus. Normal-appearing first and second portion of the duodenum.  Scope was withdrawn and a 54 [place] Maloney dilator was passed to full insertion easily.  A look back revealed no trauma related to this maneuver.  Subsequently, biopsies of the mid and distal esophagus taken for histologic study.  Retroflexed views revealed no abnormalities.     The scope was then withdrawn from the patient and the procedure completed.  COMPLICATIONS: There were no immediate complications.  ENDOSCOPIC IMPRESSION: Patent  esophagus as described. Status post passage of a Maloney dilator and biopsy?"I doubt eosinophilic esophagitis, however. Otherwise, normal EGD  RECOMMENDATIONS: Continue Protonix 40 mg twice Melanie Shepard. Further recommendations to follow.  REPEAT EXAM:  eSigned:  R. Garfield Cornea, MD Rosalita Chessman [facility] 07/13/2015 12:15 PM    CC:  CPT CODES: ICD CODES:  The ICD and CPT codes recommended by this software are interpretations from the data that the clinical staff has captured with the software.  The verification of the translation of this report to the ICD and CPT codes and modifiers is the sole responsibility of the health care institution and practicing physician where this report was generated.  [employer] will not be held responsible for the validity of the ICD and CPT codes included on this report.  AMA assumes no liability for data contained or not contained herein. CPT is a Designer, television/film set of the [employer].  PATIENT NAME:  Melanie Shepard, Melanie Shepard MR#: [id number]

## 2015-06-24 NOTE — Anesthesia Postprocedure Evaluation (Signed)
  Anesthesia Post-op Note  Patient: Semaya Bolio  Procedure(s) Performed: Procedure(s) with comments: ESOPHAGOGASTRODUODENOSCOPY (EGD) WITH PROPOFOL (N/A) MALONEY DILATION (N/A) - #54, no heme present BIOPSY (N/A)  Patient Location: PACU  Anesthesia Type:MAC  Level of Consciousness: awake, alert , oriented and patient cooperative  Airway and Oxygen Therapy: Patient Spontanous Breathing  Post-op Pain: none  Post-op Assessment: Post-op Vital signs reviewed, Patient's Cardiovascular Status Stable, Respiratory Function Stable, Patent Airway, No signs of Nausea or vomiting and Pain level controlled              Post-op Vital Signs: Reviewed and stable  Last Vitals:  Filed Vitals:   06/24/15 1135  BP: 95/58  Pulse:   Temp:   Resp: 11    Complications: No apparent anesthesia complications

## 2015-06-24 NOTE — H&P (View-Only) (Signed)
Referring Provider: Kathyrn Drown, MD Primary Care Physician:  Sallee Lange, MD  Primary GI: Dr. Gala Romney   Chief Complaint  Patient presents with  . Follow-up    HPI:   Melanie Shepard is a 49 y.o. female presenting today with a history of erosive esophagitis, with last EGD Nov 2014. Colonoscopy at that time as well as listed in [facility]. Last seen March 2016 with dyspepsia, associated nausea, symptomatic GERD. Changed from Protonix to [place]. Here to discuss symptoms. Gallbladder absent.   Dexilant helped with only some improvement. Notes LUQ discomfort intermittently, not associated with eating/drinking. Nausea intermittent. Was taking advil pm, changed to tylenol pm, now advil pm daily. 1 about every night. No melena. Notes esophageal dysphagia to "everything". Less liquid dysphagia. Symptoms present last few months. Has regurgitation, bile taste in mouth. Out of Zofran as she has to take it "alot".   AS OF NOTE, STATES SHE WOKE UP DURING COLONOSCOPY/EGD.   Notes pedal edema, hand edema. Calling for an appt with Dr. Wolfgang Phoenix. Putting a little salt on celery, otherwise no other dietary changes.  [place].     Past Medical History  Diagnosis Date  . Post concussion syndrome 1999  . 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 Cervical pain (neck)     chronic  . Hypothyroidism   . Anxiety   . Arthritis     right hip    Past Surgical History  Procedure Laterality Date  . Cervix surgery  10/11  . Cardiovascular stress test  03/12    normal  . Tubal ligation Bilateral   . Knee surgery Left 12/89  . Back surgery      cervical fusion  . Abdominal hysterectomy    . Cardiac catheterization  2014    mild CAD, no significant stenosis  . Colonoscopy with esophagogastroduodenoscopy (egd) N/A 10/15/2013    GLO:[id number] reflux esophagitis.  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 I suspect the patient had a recent enteric  infection which was responsible for the CT findings  . Left heart catheterization with coronary angiogram N/A 07/29/2013    Procedure: LEFT HEART CATHETERIZATION WITH CORONARY ANGIOGRAM;  Surgeon: Sanda Klein, MD;  Location: [facility] CATH [facility];  Service: Cardiovascular;  Laterality: N/A;  . Tracheostomy  1999    emergent   . Cholecystectomy    . Appendectomy      Current Outpatient Prescriptions  Medication Sig Dispense Refill  . ALPRAZolam (XANAX) 1 MG tablet TAKE 1 TABLET BY MOUTH TWICE DAILY 60 tablet 4  . aspirin 81 MG tablet Take 1 tablet (81 mg total) by mouth daily.    . Cholecalciferol (VITAMIN D3) 2000 UNITS TABS Take 2,000 Units by mouth daily.    Marland Kitchen dexlansoprazole (DEXILANT) 60 MG capsule Take 1 capsule (60 mg total) by mouth daily. 30 capsule 2  . levothyroxine (SYNTHROID, LEVOTHROID) 125 MCG tablet Take 1 tablet (125 mcg total) by mouth daily. 30 tablet 3  . metoprolol succinate (TOPROL-XL) 25 MG 24 hr tablet Take 1 tablet (25 mg total) by mouth daily. APPOINTMENT NEEDED FOR FUTURE REFILLS 30 tablet 1  . ondansetron (ZOFRAN ODT) 4 MG disintegrating tablet Take 1 tablet (4 mg total) by mouth every 6 (six) hours as needed for nausea or vomiting. 24 tablet 2  . ondansetron (ZOFRAN) 4 MG tablet Take 1 tablet (4 mg total) by mouth every 8 (eight) hours as needed for nausea or vomiting. 30 tablet 1  . oxyCODONE-acetaminophen (  PERCOCET) 10-325 MG per tablet One q 4 hours prn , no greater than 5 per day, May fill 10/03/14 150 tablet 0  . traZODone (DESYREL) 100 MG tablet Take 1.5 tablets (150 mg total) by mouth at bedtime. 45 tablet 3   No current facility-administered medications for this visit.    Allergies as of 06/08/2015 - Review Complete 06/08/2015  Allergen Reaction Noted  . Adhesive [tape]  07/08/2013  . Cymbalta [duloxetine hcl] Other (See Comments) 02/22/2013  . Ivp dye [iodinated diagnostic agents]  07/27/2013  . Other  07/08/2013    Family History  Problem Relation Age  of Onset  . Coronary artery disease Mother 31    MI  . Colon cancer Mother     31s, living    History   Social History  . Marital Status: Married    Spouse Name: [name]  . Number of Children: 3  . Years of Education: 13   Occupational History  .  [employer]   Social History Main Topics  . Smoking status: Current Every Day Smoker -- 1.00 packs/day for 30 years    Types: Cigarettes  . Smokeless tobacco: Never Used  . Alcohol Use: No  . Drug Use: No  . Sexual Activity: Yes    Birth Control/ Protection: Surgical   Other Topics Concern  . None   Social History Narrative   Patient is married [name]) and lives at home with her husband.   Patient has three children.   Patient is currently not working.   Patient is right-handed.   Patient has a college education.   Patient drinks about four sodas daily.    Review of Systems: Gen: Denies fever, chills, anorexia. Denies fatigue, weakness, weight loss.  CV: +peripheral edema, mild  Resp: Denies dyspnea at rest, cough, wheezing, coughing up blood, and pleurisy. GI: see HPI Derm: Denies rash, itching, dry skin Psych: Denies depression, anxiety, memory loss, confusion. No homicidal or suicidal ideation.  Heme: Denies bruising, bleeding, and enlarged lymph nodes.  Physical Exam: BP 114/78 mmHg  Pulse 94  Temp(Src) 98.3 F (36.8 C) (Oral)  Ht 5\' 7"  (1.702 m)  Wt 163 lb 12.8 oz (74.299 kg)  BMI 25.65 kg/m2 General:   Alert and oriented. No distress noted. Pleasant and cooperative.  Head:  Normocephalic and atraumatic. Eyes:  Conjuctiva clear without scleral icterus. Mouth:  Oral mucosa pink and moist. Good dentition. No lesions. Heart:  S1, S2 present without murmurs, rubs, or gallops. Regular rate and rhythm. Abdomen:  +BS, soft, mild TTP LUQ/epigastric region and non-distended. No rebound or guarding. No HSM or masses noted. Msk:  Symmetrical without gross deformities. Normal posture. Extremities:  Mild pedal  edema Neurologic:  Alert and  oriented x4;  grossly normal neurologically. Psych:  Alert and cooperative. Normal mood and affect.

## 2015-06-24 NOTE — Anesthesia Preprocedure Evaluation (Signed)
Anesthesia Evaluation  Patient identified by MRN, date of birth, ID band Patient awake    Reviewed: Allergy & Precautions, NPO status , Patient's Chart, lab work & pertinent test results, reviewed documented beta blocker date and time   History of Anesthesia Complications (+) history of anesthetic complications ( Failed conscious sedation  during colonoscopy/EGD)  Airway Mallampati: IV  TM Distance: >3 FB Neck ROM: Limited   Comment: Healed trach scar Cervical fusion with reduced ROM. Limited extension.  Dental  (+) Teeth Intact, Dental Advisory Given   Pulmonary shortness of breath, COPDCurrent Smoker,  breath sounds clear to auscultation        Cardiovascular hypertension, Pt. on home beta blockers and Pt. on medications Rhythm:Regular Rate:Normal     Neuro/Psych Seizures -,  PSYCHIATRIC DISORDERS Anxiety    GI/Hepatic GERD-  ,  Endo/Other  Hypothyroidism   Renal/GU      Musculoskeletal  (+) Arthritis -,   Abdominal   Peds  Hematology  (+) anemia ,   Anesthesia Other Findings   Reproductive/Obstetrics                             Anesthesia Physical Anesthesia Plan  ASA: III  Anesthesia Plan: MAC   Post-op Pain Management:    Induction: Intravenous  Airway Management Planned: Simple Face Mask  Additional Equipment:   Intra-op Plan:   Post-operative Plan:   Informed Consent: I have reviewed the patients History and Physical, chart, labs and discussed the procedure including the risks, benefits and alternatives for the proposed anesthesia with the patient or authorized representative who has indicated his/her understanding and acceptance.     Plan Discussed with: CRNA  Anesthesia Plan Comments: (AIRWAY ALERT.)        Anesthesia Quick Evaluation

## 2015-06-24 NOTE — Interval H&P Note (Signed)
History and Physical Interval Note:  06/24/2015 10:59 AM  Melanie Shepard  has presented today for surgery, with the diagnosis of dysphagia, dyspepsia  The various methods of treatment have been discussed with the patient and family. After consideration of risks, benefits and other options for treatment, the patient has consented to  Procedure(s) with comments: ESOPHAGOGASTRODUODENOSCOPY (EGD) WITH PROPOFOL (N/A) - 1145am MALONEY DILATION (N/A) as a surgical intervention .  The patient's history has been reviewed, patient examined, no change in status, stable for surgery.  I have reviewed the patient's chart and labs.  Questions were answered to the patient's satisfaction.     Safiyah Cisney  No change. EGD with esophageal dilation as feasible/appropriate per plan.The risks, benefits, limitations, alternatives and imponderables have been reviewed with the patient. Potential for esophageal dilation, biopsy, etc. have also been reviewed.  Questions have been answered. All parties agreeable.

## 2015-06-24 NOTE — Discharge Instructions (Signed)
EGD Discharge instructions Please read the instructions outlined below and refer to this sheet in the next few weeks. These discharge instructions provide you with general information on caring for yourself after you leave the hospital. Your doctor may also give you specific instructions. While your treatment has been planned according to the most current medical practices available, unavoidable complications occasionally occur. If you have any problems or questions after discharge, please call your doctor. ACTIVITY  You may resume your regular activity but move at a slower pace for the next 24 hours.   Take frequent rest periods for the next 24 hours.   Walking will help expel (get rid of) the air and reduce the bloated feeling in your abdomen.   No driving for 24 hours (because of the anesthesia (medicine) used during the test).   You may shower.   Do not sign any important legal documents or operate any machinery for 24 hours (because of the anesthesia used during the test).  NUTRITION  Drink plenty of fluids.   You may resume your normal diet.   Begin with a light meal and progress to your normal diet.   Avoid alcoholic beverages for 24 hours or as instructed by your caregiver.  MEDICATIONS  You may resume your normal medications unless your caregiver tells you otherwise.  WHAT YOU CAN EXPECT TODAY  You may experience abdominal discomfort such as a feeling of fullness or gas pains.  FOLLOW-UP  Your doctor will discuss the results of your test with you.  SEEK IMMEDIATE MEDICAL ATTENTION IF ANY OF THE FOLLOWING OCCUR:  Excessive nausea (feeling sick to your stomach) and/or vomiting.   Severe abdominal pain and distention (swelling).   Trouble swallowing.   Temperature over 101 F (37.8 C).   Rectal bleeding or vomiting of blood.    Continue Protonix 40 mg twice daily  Further recommendations to follow pending review of pathology report

## 2015-06-24 NOTE — Transfer of Care (Signed)
Immediate Anesthesia Transfer of Care Note  Patient: Melanie Shepard  Procedure(s) Performed: Procedure(s) with comments: ESOPHAGOGASTRODUODENOSCOPY (EGD) WITH PROPOFOL (N/A) MALONEY DILATION (N/A) - #54, no heme present BIOPSY (N/A)  Patient Location: PACU  Anesthesia Type:MAC  Level of Consciousness: awake, alert  and patient cooperative  Airway & Oxygen Therapy: Patient Spontanous Breathing and Patient connected to face mask oxygen  Post-op Assessment: Report given to RN, Post -op Vital signs reviewed and stable and Patient moving all extremities  Post vital signs: Reviewed and stable  Last Vitals:  Filed Vitals:   06/24/15 1135  BP: 95/58  Pulse:   Temp:   Resp: 11    Complications: No apparent anesthesia complications

## 2015-06-25 ENCOUNTER — Encounter (HOSPITAL_COMMUNITY): Payer: Self-pay | Admitting: Internal Medicine

## 2015-06-25 MED ORDER — LEVOTHYROXINE SODIUM 150 MCG PO TABS
150.0000 ug | ORAL_TABLET | Freq: Every day | ORAL | Status: DC
Start: 2015-06-25 — End: 2016-04-06

## 2015-06-25 NOTE — Addendum Note (Signed)
Addended by: Launa Grill on: 06/25/2015 04:29 PM   Modules accepted: Orders

## 2015-06-26 ENCOUNTER — Encounter: Payer: Self-pay | Admitting: Internal Medicine

## 2015-06-28 ENCOUNTER — Telehealth: Payer: Self-pay

## 2015-06-28 ENCOUNTER — Encounter: Payer: Self-pay | Admitting: Internal Medicine

## 2015-06-28 NOTE — Telephone Encounter (Signed)
Per RMR- Send letter to patient.  Send copy of letter with path to referring provider and PCP.   Offer ov w Extender if not already made

## 2015-06-28 NOTE — Telephone Encounter (Signed)
Letter mailed to the pt. 

## 2015-06-28 NOTE — Telephone Encounter (Signed)
APPOINTMENT MADE AND LETTER SENT °

## 2015-07-06 ENCOUNTER — Ambulatory Visit: Payer: BLUE CROSS/BLUE SHIELD | Admitting: Family Medicine

## 2015-08-10 ENCOUNTER — Other Ambulatory Visit: Payer: Self-pay

## 2015-08-10 ENCOUNTER — Ambulatory Visit (INDEPENDENT_AMBULATORY_CARE_PROVIDER_SITE_OTHER): Payer: BLUE CROSS/BLUE SHIELD | Admitting: Gastroenterology

## 2015-08-10 ENCOUNTER — Encounter: Payer: Self-pay | Admitting: Gastroenterology

## 2015-08-10 ENCOUNTER — Encounter: Payer: Self-pay | Admitting: Internal Medicine

## 2015-08-10 VITALS — BP 103/69 | HR 78 | Temp 97.0°F | Ht 67.0 in | Wt 163.8 lb

## 2015-08-10 DIAGNOSIS — R110 Nausea: Secondary | ICD-10-CM

## 2015-08-10 DIAGNOSIS — R112 Nausea with vomiting, unspecified: Secondary | ICD-10-CM

## 2015-08-10 MED ORDER — ONDANSETRON 4 MG PO TBDP: 4.0000 mg | ORAL_TABLET | Freq: Four times a day (QID) | ORAL | Status: DC | PRN

## 2015-08-10 NOTE — Patient Instructions (Signed)
We have scheduled you for a gastric emptying study.  I would like for you to review this handout on gastroparesis; you may or may not have this, but it will help explain in more detail.   Continue taking Protonix twice a day. I have refilled Zofran for you to take as needed for nausea.   We will see you back in 3 months.
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
## 2015-08-10 NOTE — Progress Notes (Signed)
CC'ED TO PCP 

## 2015-08-10 NOTE — Assessment & Plan Note (Signed)
49 year old female returning with underlying nausea, bloating, and early satiety, with EGD on file unremarkable. Dysphagia resolved with empiric dilation. Proceed with gastric emptying study. Further recommendations to follow. Return in 3 months.

## 2015-08-17 ENCOUNTER — Encounter (HOSPITAL_COMMUNITY): Admission: RE | Admit: 2015-08-17 | Payer: BLUE CROSS/BLUE SHIELD | Source: Ambulatory Visit

## 2015-09-24 ENCOUNTER — Encounter: Payer: Self-pay | Admitting: Family Medicine

## 2015-09-24 ENCOUNTER — Ambulatory Visit (INDEPENDENT_AMBULATORY_CARE_PROVIDER_SITE_OTHER): Payer: BLUE CROSS/BLUE SHIELD | Admitting: Family Medicine

## 2015-09-24 VITALS — BP 118/84 | Ht 67.0 in | Wt 164.0 lb

## 2015-09-24 DIAGNOSIS — M542 Cervicalgia: Secondary | ICD-10-CM

## 2015-09-24 DIAGNOSIS — E785 Hyperlipidemia, unspecified: Secondary | ICD-10-CM

## 2015-09-24 DIAGNOSIS — D72829 Elevated white blood cell count, unspecified: Secondary | ICD-10-CM

## 2015-09-24 DIAGNOSIS — E559 Vitamin D deficiency, unspecified: Secondary | ICD-10-CM

## 2015-09-24 DIAGNOSIS — G8929 Other chronic pain: Secondary | ICD-10-CM

## 2015-09-24 DIAGNOSIS — R739 Hyperglycemia, unspecified: Secondary | ICD-10-CM | POA: Diagnosis not present

## 2015-09-24 DIAGNOSIS — E038 Other specified hypothyroidism: Secondary | ICD-10-CM | POA: Diagnosis not present

## 2015-09-24 MED ORDER — OXYCODONE-ACETAMINOPHEN 10-325 MG PO TABS: ORAL_TABLET | ORAL | Status: DC

## 2015-09-24 NOTE — Progress Notes (Signed)
Subjective:    Patient ID: Melanie Shepard, female    DOB: 30-Mar-1966, 49 y.o.   MRN: [id number]  HPI This patient was seen today for chronic pain  The medication list was reviewed and updated.   -Compliance with medication: takes as prescribed  - Number patient states they take daily: takes 4 -5 per day  -when was the last dose patient took? Took one at 5 am today  The patient was advised the importance of maintaining medication and not using illegal substances with these.  Refills needed: yes  The patient was educated that we can provide 3 monthly scripts for their medication, it is their responsibility to follow the instructions.  Side effects or complications from medications: none  Patient is aware that pain medications are meant to minimize the severity of the pain to allow their pain levels to improve to allow for better function. They are aware of that pain medications cannot totally remove their pain.  Due for UDT ( at least once per year) : done 06/18/15  Declines flu vaccine.   Pt did get her disability.  Patient stressed her brother passed away from a unexpected heart attack Pt states no other concerns today.   We did discuss her hypothyroidism she takes her medicine on regular basis she does feel that it is helping. We do need to check lab work She does have chronic neck pain takes her pain medication for this takes the edge off pain she denies being abusing the medicine denies being drugged by the medicine. She does have a history vitamin D deficiency she takes a vitamin D supplement she needs to check her level History of hyperglycemia she does not always watch her diet we need to check her lab work for possibility of diabetes Last CBC showed leukocytosis repeat CBC to make sure white count looks good Importance of regular physical activity such as walking and healthy eating recommended plus also quitting smoking in order to lessen the risk of heart disease.   25  minutes was spent with the patient. Greater than half the time was spent in discussion and answering questions and counseling regarding the issues that the patient came in for today.   Review of Systems  Constitutional: Negative for activity change and appetite change.  Gastrointestinal: Negative for vomiting and abdominal pain.  Neurological: Negative for weakness.  Psychiatric/Behavioral: Negative for confusion.       Objective:   Physical Exam  Constitutional: She appears well-nourished. No distress.  Cardiovascular: Normal rate, regular rhythm and normal heart sounds.   No murmur heard. Pulmonary/Chest: Effort normal and breath sounds normal. No respiratory distress.  Musculoskeletal: She exhibits no edema.  Lymphadenopathy:    She has no cervical adenopathy.  Neurological: She is alert. She exhibits normal muscle tone.  Psychiatric: Her behavior is normal.  Vitals reviewed.         Assessment & Plan:  1. Other specified hypothyroidism Patient with significant hypothyroidism takes her medicine on a regular basis we will check lab work await the results of this. - TSH - T4, free - Hemoglobin A1c - Lipid panel - Basic metabolic panel - Vit D  25 hydroxy (rtn osteoporosis monitoring) - CBC with Differential/Platelet  2. 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 Does not need to see neurosurgery currently. Has tried physical therapy in the past none additional necessary currently patient denies pain medication causing any type of  narcosis or other drug effects - TSH - T4, free - Hemoglobin A1c - Lipid panel - Basic metabolic panel - Vit D  25 hydroxy (rtn osteoporosis monitoring) - CBC with Differential/Platelet  3. Hyperlipidemia Patient understands importance of a healthy diet check lipid profile has history of abnormal cholesterol the past. The importance of keeping this under control  to minimize risk of heart disease was discussed in detail patient takes 81 mg aspirin does have family history of heart disease she states she is seeing cardiology in the near future - TSH - T4, free - Hemoglobin A1c - Lipid panel - Basic metabolic panel - Vit D  25 hydroxy (rtn osteoporosis monitoring) - CBC with Differential/Platelet  4. Vitamin D deficiency Patient with history vitamin D deficiency she does take vitamin D supplement we will check a level - TSH - T4, free - Hemoglobin A1c - Lipid panel - Basic metabolic panel - Vit D  25 hydroxy (rtn osteoporosis monitoring) - CBC with Differential/Platelet  5. Hyperglycemia History of hyperglycemia we'll check A1c to rule out diabetes importance keeping this under control to minimize cardiac risk was discussed - TSH - T4, free - Hemoglobin A1c - Lipid panel - Basic metabolic panel - Vit D  25 hydroxy (rtn osteoporosis monitoring) - CBC with Differential/Platelet  6. Leukocytosis History past leukocytosis repeat CBC in order to make sure that this count is normal - TSH - T4, free - Hemoglobin A1c - Lipid panel - Basic metabolic panel - Vit D  25 hydroxy (rtn osteoporosis monitoring) - CBC with Differential/Platelet Follow-up in 3 months for a 3 month check up.

## 2015-09-24 NOTE — Patient Instructions (Addendum)
As part of your visit today we have covered your chronic pain. You have been given prescription(s) for pain medicines.The [place]A and [place] require that any patient on pain medications must be seen every 3 months. You are expected to come in for a office visit before further pain medications are issued.  Since we are managing your pain do not get pain scripts from other doctors. We check the prescription registry regularly. If you are receiving pain medicines from another source we will STOP prescribing pain medicines.   We will not refill medications or early nor will we give an extended month supply at the end of these prescriptions.It is your responsibility to keep up with medications. They will not be replaced.  It is your responsibility to schedule an office visit in 3 months to be seen before you are out of your medication. Do not call our office to request early refills or additional refills. Do not wait till the last moment to schedule the follow up visit. We highly recommend you schedule this now for 3 months.  We believe that most patients take their meds as prescribed but drug misuse and diversion is a serious problem in the [place]. Our office does standard measures to insure proper care to all. All patients are subject to random urine drug screens/ saliva tests and random pill counts. Also all patients drug prescription records are reviewed on a regular basis in accordance with [facility] medical board policies.  Remember, do not use alcohol or illegal drugs with your pain medications. If you are feeling drowsy or affected by your medicine you are not to operate any machinery , do any dangerous activities or drive while this is occurring.   We are required by law to adhere to strict regulations. Failure on our part to follow these regulations could jeopardize our prescription license which in turn would cause [place] not to be able to care for you.Thank you for your understanding and following  these policies.   Steps to Quit Smoking  Smoking tobacco can be harmful to your health and can affect almost every organ in your body. Smoking puts you, and those around you, at risk for developing many serious chronic diseases. Quitting smoking is difficult, but it is one of the best things that you can do for your health. It is never too late to quit. WHAT ARE THE BENEFITS OF QUITTING SMOKING? When you quit smoking, you lower your risk of developing serious diseases and conditions, such as:  Lung cancer or lung disease, such as COPD.  Heart disease.  Stroke.  Heart attack.  Infertility.  Osteoporosis and bone fractures. Additionally, symptoms such as coughing, wheezing, and shortness of breath may get better when you quit. You may also find that you get sick less often because your body is stronger at fighting off colds and infections. If you are pregnant, quitting smoking can help to reduce your chances of having a baby of low birth weight. HOW DO I GET READY TO QUIT? When you decide to quit smoking, create a plan to make sure that you are successful. Before you quit:  Pick a date to quit. Set a date within the next two weeks to give you time to prepare.  Write down the reasons why you are quitting. Keep this list in places where you will see it often, such as on your bathroom mirror or in your car or wallet.  Identify the people, places, things, and activities that make you want  to smoke (triggers) and avoid them. Make sure to take these actions:  Throw away all cigarettes at home, at work, and in your car.  Throw away smoking accessories, such as Scientist, research (medical).  Clean your car and make sure to empty the ashtray.  Clean your home, including curtains and carpets.  Tell your family, friends, and coworkers that you are quitting. Support from your loved ones can make quitting easier.  Talk with your health care provider about your options for quitting smoking.  Find  out what treatment options are covered by your health insurance. WHAT STRATEGIES CAN I USE TO QUIT SMOKING?  Talk with your healthcare provider about different strategies to quit smoking. Some strategies include:  Quitting smoking altogether instead of gradually lessening how much you smoke over a period of time. Research shows that quitting "cold [place]" is more successful than gradually quitting.  Attending in-person counseling to help you build problem-solving skills. You are more likely to have success in quitting if you attend several counseling sessions. Even short sessions of 10 minutes can be effective.  Finding resources and support systems that can help you to quit smoking and remain smoke-free after you quit. These resources are most helpful when you use them often. They can include:  Online chats with a Social worker.  Telephone quitlines.  Printed Furniture conservator/restorer.  Support groups or group counseling.  Text messaging programs.  Mobile phone applications.  Taking medicines to help you quit smoking. (If you are pregnant or breastfeeding, talk with your health care provider first.) Some medicines contain nicotine and some do not. Both types of medicines help with cravings, but the medicines that include nicotine help to relieve withdrawal symptoms. Your health care provider may recommend:  Nicotine patches, gum, or lozenges.  Nicotine inhalers or sprays.  Non-nicotine medicine that is taken by mouth. Talk with your health care provider about combining strategies, such as taking medicines while you are also receiving in-person counseling. Using these two strategies together makes you more likely to succeed in quitting than if you used either strategy on its own. If you are pregnant or breastfeeding, talk with your health care provider about finding counseling or other support strategies to quit smoking. Do not take medicine to help you quit smoking unless told to do so by your  health care provider. WHAT THINGS CAN I DO TO MAKE IT EASIER TO QUIT? Quitting smoking might feel overwhelming at first, but there is a lot that you can do to make it easier. Take these important actions:  Reach out to your family and friends and ask that they support and encourage you during this time. Call telephone quitlines, reach out to support groups, or work with a counselor for support.  Ask people who smoke to avoid smoking around you.  Avoid places that trigger you to smoke, such as bars, parties, or smoke-break areas at work.  Spend time around people who do not smoke.  Lessen stress in your life, because stress can be a smoking trigger for some people. To lessen stress, try:  Exercising regularly.  Deep-breathing exercises.  Yoga.  Meditating.  Performing a body scan. This involves closing your eyes, scanning your body from head to toe, and noticing which parts of your body are particularly tense. Purposefully relax the muscles in those areas.  Download or purchase mobile phone or tablet apps (applications) that can help you stick to your quit plan by providing reminders, tips, and encouragement. There are many free apps,  such as QuitGuide from the [employer] Office manager for Disease Control and Prevention). You can find other support for quitting smoking (smoking cessation) through smokefree.gov and other websites. HOW WILL I FEEL WHEN I QUIT SMOKING? Within the first 24 hours of quitting smoking, you may start to feel some withdrawal symptoms. These symptoms are usually most noticeable 2-3 days after quitting, but they usually do not last beyond 2-3 weeks. Changes or symptoms that you might experience include:  Mood swings.  Restlessness, anxiety, or irritation.  Difficulty concentrating.  Dizziness.  Strong cravings for sugary foods in addition to nicotine.  Mild weight gain.  Constipation.  Nausea.  Coughing or a sore throat.  Changes in how your medicines work in  your body.  A depressed mood.  Difficulty sleeping (insomnia). After the first 2-3 weeks of quitting, you may start to notice more positive results, such as:  Improved sense of smell and taste.  Decreased coughing and sore throat.  Slower heart rate.  Lower blood pressure.  Clearer skin.  The ability to breathe more easily.  Fewer sick days. Quitting smoking is very challenging for most people. Do not get discouraged if you are not successful the first time. Some people need to make many attempts to quit before they achieve long-term success. Do your best to stick to your quit plan, and talk with your health care provider if you have any questions or concerns.   This information is not intended to replace advice given to you by your health care provider. Make sure you discuss any questions you have with your health care provider.   Document Released: 11/21/2001 Document Revised: 04/13/2015 Document Reviewed: 04/13/2015 Elsevier Interactive Patient Education [employer].

## 2015-10-08 ENCOUNTER — Ambulatory Visit (INDEPENDENT_AMBULATORY_CARE_PROVIDER_SITE_OTHER): Payer: BLUE CROSS/BLUE SHIELD | Admitting: Cardiovascular Disease

## 2015-10-08 ENCOUNTER — Encounter: Payer: Self-pay | Admitting: Cardiovascular Disease

## 2015-10-08 VITALS — BP 110/78 | HR 89 | Resp 16 | Ht 67.0 in | Wt 164.4 lb

## 2015-10-08 DIAGNOSIS — I2510 Atherosclerotic heart disease of native coronary artery without angina pectoris: Secondary | ICD-10-CM

## 2015-10-08 DIAGNOSIS — R9431 Abnormal electrocardiogram [ECG] [EKG]: Secondary | ICD-10-CM

## 2015-10-08 NOTE — Patient Instructions (Signed)
Your physician discussed the hazards of tobacco use. Tobacco use cessation is recommended and techniques and options to help you quit were discussed.   Dr. Sallyanne Kuster recommends that you schedule a follow-up appointment in: [place]

## 2015-10-10 ENCOUNTER — Encounter: Payer: Self-pay | Admitting: Cardiovascular Disease

## 2015-10-10 DIAGNOSIS — I2510 Atherosclerotic heart disease of native coronary artery without angina pectoris: Secondary | ICD-10-CM | POA: Insufficient documentation
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
## 2015-10-23 ENCOUNTER — Other Ambulatory Visit: Payer: Self-pay | Admitting: Gastroenterology

## 2015-10-26 ENCOUNTER — Emergency Department (HOSPITAL_COMMUNITY): Payer: BLUE CROSS/BLUE SHIELD

## 2015-10-26 ENCOUNTER — Encounter (HOSPITAL_COMMUNITY): Payer: Self-pay | Admitting: *Deleted

## 2015-10-26 ENCOUNTER — Emergency Department (HOSPITAL_COMMUNITY)
Admission: EM | Admit: 2015-10-26 | Discharge: 2015-10-27 | Disposition: A | Discharge status: Home / Self Care | Payer: BLUE CROSS/BLUE SHIELD | Attending: Emergency Medicine | Admitting: Emergency Medicine

## 2015-10-26 DIAGNOSIS — M1990 Unspecified osteoarthritis, unspecified site: Secondary | ICD-10-CM | POA: Diagnosis not present

## 2015-10-26 DIAGNOSIS — R42 Dizziness and giddiness: Secondary | ICD-10-CM | POA: Diagnosis not present

## 2015-10-26 DIAGNOSIS — F419 Anxiety disorder, unspecified: Secondary | ICD-10-CM | POA: Diagnosis not present

## 2015-10-26 DIAGNOSIS — G8929 Other chronic pain: Secondary | ICD-10-CM | POA: Insufficient documentation

## 2015-10-26 DIAGNOSIS — Z9889 Other specified postprocedural states: Secondary | ICD-10-CM | POA: Insufficient documentation

## 2015-10-26 DIAGNOSIS — M545 Low back pain: Secondary | ICD-10-CM | POA: Diagnosis not present

## 2015-10-26 DIAGNOSIS — R51 Headache: Secondary | ICD-10-CM | POA: Insufficient documentation

## 2015-10-26 DIAGNOSIS — E039 Hypothyroidism, unspecified: Secondary | ICD-10-CM | POA: Diagnosis not present

## 2015-10-26 DIAGNOSIS — R110 Nausea: Secondary | ICD-10-CM | POA: Diagnosis not present

## 2015-10-26 DIAGNOSIS — I2510 Atherosclerotic heart disease of native coronary artery without angina pectoris: Secondary | ICD-10-CM | POA: Insufficient documentation

## 2015-10-26 DIAGNOSIS — Z79899 Other long term (current) drug therapy: Secondary | ICD-10-CM | POA: Insufficient documentation

## 2015-10-26 DIAGNOSIS — I10 Essential (primary) hypertension: Secondary | ICD-10-CM | POA: Insufficient documentation

## 2015-10-26 DIAGNOSIS — Z7982 Long term (current) use of aspirin: Secondary | ICD-10-CM | POA: Insufficient documentation

## 2015-10-26 DIAGNOSIS — Z87828 Personal history of other (healed) physical injury and trauma: Secondary | ICD-10-CM | POA: Diagnosis not present

## 2015-10-26 DIAGNOSIS — K219 Gastro-esophageal reflux disease without esophagitis: Secondary | ICD-10-CM | POA: Insufficient documentation

## 2015-10-26 DIAGNOSIS — R079 Chest pain, unspecified: Secondary | ICD-10-CM | POA: Diagnosis present

## 2015-10-26 DIAGNOSIS — Z862 Personal history of diseases of the blood and blood-forming organs and certain disorders involving the immune mechanism: Secondary | ICD-10-CM | POA: Diagnosis not present

## 2015-10-26 DIAGNOSIS — F17210 Nicotine dependence, cigarettes, uncomplicated: Secondary | ICD-10-CM | POA: Insufficient documentation

## 2015-10-26 LAB — CBC WITH DIFFERENTIAL/PLATELET
Basophils Absolute: 0.1 10*3/uL (ref 0.0–0.1)
Basophils Relative: 1 %
EOS PCT: 4 %
Eosinophils Absolute: 0.6 10*3/uL (ref 0.0–0.7)
HCT: 39 % (ref 36.0–46.0)
Hemoglobin: 13.3 g/dL (ref 12.0–15.0)
Lymphocytes Relative: 27 %
Lymphs Abs: 3.9 10*3/uL (ref 0.7–4.0)
MCH: 36.8 pg — AB (ref 26.0–34.0)
MCHC: 34.1 g/dL (ref 30.0–36.0)
MCV: 108 fL — ABNORMAL HIGH (ref 78.0–100.0)
Monocytes Absolute: 0.6 10*3/uL (ref 0.1–1.0)
Monocytes Relative: 4 %
Neutro Abs: 9.5 10*3/uL — ABNORMAL HIGH (ref 1.7–7.7)
Neutrophils Relative %: 64 %
PLATELETS: 300 10*3/uL (ref 150–400)
RBC: 3.61 MIL/uL — AB (ref 3.87–5.11)
RDW: 14.6 % (ref 11.5–15.5)
WBC: 14.6 10*3/uL — AB (ref 4.0–10.5)

## 2015-10-26 LAB — BASIC METABOLIC PANEL
Anion gap: 11 (ref 5–15)
BUN: 6 mg/dL (ref 6–20)
CO2: 24 mmol/L (ref 22–32)
Calcium: 9.3 mg/dL (ref 8.9–10.3)
Chloride: 96 mmol/L — ABNORMAL LOW (ref 101–111)
Creatinine, Ser: 0.77 mg/dL (ref 0.44–1.00)
GFR calc Af Amer: >60 mL/min (ref >60)
Glucose, Bld: 123 mg/dL — ABNORMAL HIGH (ref 65–99)
Potassium: 3.6 mmol/L (ref 3.5–5.1)
Sodium: 131 mmol/L — ABNORMAL LOW (ref 135–145)

## 2015-10-26 LAB — TROPONIN I

## 2015-10-26 MED ORDER — MORPHINE SULFATE (PF) 2 MG/ML IV SOLN
2.0000 mg | Freq: Once | INTRAVENOUS | Status: AC
  Administered 2015-10-26: 2 mg via INTRAVENOUS
  Filled 2015-10-26: qty 1

## 2015-10-26 MED ORDER — PANTOPRAZOLE SODIUM 40 MG IV SOLR
40.0000 mg | Freq: Once | INTRAVENOUS | Status: AC
  Administered 2015-10-26: 40 mg via INTRAVENOUS
  Filled 2015-10-26: qty 40

## 2015-10-26 MED ORDER — LORAZEPAM 2 MG/ML IJ SOLN
1.0000 mg | Freq: Once | INTRAMUSCULAR | Status: AC
  Administered 2015-10-26: 1 mg via INTRAVENOUS
  Filled 2015-10-26: qty 1

## 2015-10-26 MED ORDER — SODIUM CHLORIDE 0.9 % IV SOLN: INTRAVENOUS | Status: DC

## 2015-10-26 MED ORDER — SODIUM CHLORIDE 0.9 % IV BOLUS (SEPSIS)
1000.0000 mL | Freq: Once | INTRAVENOUS | Status: AC
  Administered 2015-10-26: 1000 mL via INTRAVENOUS

## 2015-10-26 MED ORDER — ASPIRIN 81 MG PO CHEW
162.0000 mg | CHEWABLE_TABLET | Freq: Once | ORAL | Status: AC
  Administered 2015-10-26: 162 mg via ORAL
  Filled 2015-10-26: qty 2

## 2015-10-26 MED ORDER — NITROGLYCERIN 0.4 MG SL SUBL
0.4000 mg | SUBLINGUAL_TABLET | SUBLINGUAL | Status: DC | PRN
  Administered 2015-10-26 (×2): 0.4 mg via SUBLINGUAL
  Filled 2015-10-26: qty 1

## 2015-10-26 MED ORDER — ONDANSETRON HCL 4 MG/2ML IJ SOLN
4.0000 mg | Freq: Once | INTRAMUSCULAR | Status: AC
  Administered 2015-10-26: 4 mg via INTRAVENOUS
  Filled 2015-10-26: qty 2
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
## 2015-10-26 NOTE — ED Notes (Signed)
Pt states chest pains that started about 2 hours ago while sitting.

## 2015-10-26 NOTE — Discharge Instructions (Signed)
Negative limited to follow-up with your cardiologist and primary care doctor. Return for any new or worse symptoms.

## 2015-10-27 LAB — TROPONIN I: Troponin I: <0.03 ng/mL (ref <0.031)

## 2015-10-27 NOTE — ED Notes (Addendum)
Patient requested IV to be removed. States "I am ready to go."

## 2015-10-27 NOTE — ED Provider Notes (Signed)
Pt left at change of shift to get second troponin. Pt describes right sided chest pain that is nonradiating described as pressure. She has a hx of GERD. Pt states her pain is not gone but is better.  Pt is alert and sitting on the side of the bed. She is in NAD.    Results for orders placed or performed during the hospital encounter of 10/26/15  Troponin I  Result Value Ref Range   Troponin I <0.03 <0.031 ng/mL  Result Value Ref Range   Troponin I <0.03 <0.031 ng/mL    Diagnoses that have been ruled out:  None  Diagnoses that are still under consideration:  None  Final diagnoses:  Chest pain, unspecified chest pain type    Plan discharge  Rolland Porter, MD, Barbette Or, MD 10/27/15 [phone]

## 2015-11-10 ENCOUNTER — Ambulatory Visit (INDEPENDENT_AMBULATORY_CARE_PROVIDER_SITE_OTHER): Payer: BLUE CROSS/BLUE SHIELD | Admitting: Gastroenterology

## 2015-11-10 ENCOUNTER — Encounter: Payer: Self-pay | Admitting: Gastroenterology

## 2015-11-10 VITALS — BP 102/71 | HR 108 | Temp 97.9°F | Ht 67.0 in | Wt 167.2 lb

## 2015-11-10 DIAGNOSIS — R110 Nausea: Secondary | ICD-10-CM | POA: Diagnosis not present

## 2015-11-10 DIAGNOSIS — I2510 Atherosclerotic heart disease of native coronary artery without angina pectoris: Secondary | ICD-10-CM
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
## 2015-11-10 NOTE — Progress Notes (Signed)
cc'ed to pcp °

## 2015-11-10 NOTE — Patient Instructions (Signed)
We have ordered the gastric emptying study.   I will see you in 3 months. Depending on what the study shows, we may be adding a new medication or pursuing further work-up.  Have a good Christmas!

## 2015-11-10 NOTE — Assessment & Plan Note (Signed)
49 year old female with persistent bloating, early satiety, and nausea. Rare vomiting. EGD up-to-date. Will proceed with GES in near future. If no obvious reasons for persistent symptoms, may need additional imaging. 3 month return. I question if her TSH abnormalities are contributing to symptoms as well. She has an appt upcoming with her PCP for further discussion.

## 2015-11-11 LAB — BASIC METABOLIC PANEL
BUN/Creatinine Ratio: 9 (ref 9–23)
BUN: 6 mg/dL (ref 6–24)
CO2: 21 mmol/L (ref 18–29)
CREATININE: 0.65 mg/dL (ref 0.57–1.00)
Calcium: 9.8 mg/dL (ref 8.7–10.2)
Chloride: 99 mmol/L (ref 97–106)
GFR calc Af Amer: 121 mL/min/{1.73_m2} (ref >59)
GFR, EST NON AFRICAN AMERICAN: 105 mL/min/{1.73_m2} (ref >59)
Glucose: 97 mg/dL (ref 65–99)
Potassium: 4 mmol/L (ref 3.5–5.2)
Sodium: 139 mmol/L (ref 136–144)

## 2015-11-11 LAB — CBC WITH DIFFERENTIAL/PLATELET
BASOS: 1 %
Basophils Absolute: 0.1 10*3/uL (ref 0.0–0.2)
EOS (ABSOLUTE): 0.6 10*3/uL — ABNORMAL HIGH (ref 0.0–0.4)
EOS: 5 %
HEMATOCRIT: 39.4 % (ref 34.0–46.6)
Hemoglobin: 13.3 g/dL (ref 11.1–15.9)
IMMATURE GRANULOCYTES: 1 %
Immature Grans (Abs): 0.1 10*3/uL (ref 0.0–0.1)
LYMPHS ABS: 4.9 10*3/uL — AB (ref 0.7–3.1)
Lymphs: 35 %
MCH: 36.2 pg — ABNORMAL HIGH (ref 26.6–33.0)
MCHC: 33.8 g/dL (ref 31.5–35.7)
MCV: 107 fL — AB (ref 79–97)
Monocytes Absolute: 0.6 10*3/uL (ref 0.1–0.9)
Monocytes: 4 %
NEUTROS PCT: 54 %
Neutrophils Absolute: 7.6 10*3/uL — ABNORMAL HIGH (ref 1.4–7.0)
PLATELETS: 301 10*3/uL (ref 150–379)
RBC: 3.67 x10E6/uL — AB (ref 3.77–5.28)
RDW: 15.9 % — ABNORMAL HIGH (ref 12.3–15.4)
WBC: 13.8 10*3/uL — AB (ref 3.4–10.8)

## 2015-11-11 LAB — HEMOGLOBIN A1C
ESTIMATED AVERAGE GLUCOSE: 126 mg/dL
HEMOGLOBIN A1C: 6 % — AB (ref 4.8–5.6)

## 2015-11-11 LAB — LIPID PANEL
CHOLESTEROL TOTAL: 390 mg/dL — AB (ref 100–199)
Chol/HDL Ratio: 15.6 ratio units — ABNORMAL HIGH (ref 0.0–4.4)
HDL: 25 mg/dL — ABNORMAL LOW (ref >39)
Triglycerides: 806 mg/dL (ref 0–149)

## 2015-11-11 LAB — TSH: TSH: 86.88 u[IU]/mL — AB (ref 0.450–4.500)

## 2015-11-11 LAB — VITAMIN D 25 HYDROXY (VIT D DEFICIENCY, FRACTURES): Vit D, 25-Hydroxy: 10.3 ng/mL — ABNORMAL LOW (ref 30.0–100.0)

## 2015-11-11 LAB — T4, FREE: Free T4: 0.51 ng/dL — ABNORMAL LOW (ref 0.82–1.77)

## 2015-11-17 ENCOUNTER — Other Ambulatory Visit: Payer: Self-pay | Admitting: Gastroenterology

## 2015-11-22 ENCOUNTER — Encounter (HOSPITAL_COMMUNITY)
Admission: RE | Admit: 2015-11-22 | Discharge: 2015-11-22 | Disposition: A | Discharge status: Home / Self Care | Payer: BLUE CROSS/BLUE SHIELD | Source: Ambulatory Visit | Attending: Gastroenterology | Admitting: Gastroenterology

## 2015-11-22 ENCOUNTER — Encounter (HOSPITAL_COMMUNITY): Payer: Self-pay

## 2015-11-22 DIAGNOSIS — R112 Nausea with vomiting, unspecified: Secondary | ICD-10-CM | POA: Insufficient documentation

## 2015-11-22 MED ORDER — TECHNETIUM TC 99M SULFUR COLLOID
2.0000 | Freq: Once | INTRAVENOUS | Status: AC | PRN
  Administered 2015-11-22: 2.2 via INTRAVENOUS

## 2015-11-23 ENCOUNTER — Ambulatory Visit (INDEPENDENT_AMBULATORY_CARE_PROVIDER_SITE_OTHER): Payer: BLUE CROSS/BLUE SHIELD | Admitting: Family Medicine

## 2015-11-23 ENCOUNTER — Encounter: Payer: Self-pay | Admitting: Family Medicine

## 2015-11-23 VITALS — BP 118/78 | Ht 67.0 in | Wt 168.5 lb

## 2015-11-23 DIAGNOSIS — G8929 Other chronic pain: Secondary | ICD-10-CM

## 2015-11-23 DIAGNOSIS — E038 Other specified hypothyroidism: Secondary | ICD-10-CM

## 2015-11-23 DIAGNOSIS — E781 Pure hyperglyceridemia: Secondary | ICD-10-CM

## 2015-11-23 DIAGNOSIS — M542 Cervicalgia: Secondary | ICD-10-CM | POA: Diagnosis not present

## 2015-11-23 DIAGNOSIS — I2510 Atherosclerotic heart disease of native coronary artery without angina pectoris: Secondary | ICD-10-CM

## 2015-11-23 MED ORDER — OXYCODONE-ACETAMINOPHEN 10-325 MG PO TABS: ORAL_TABLET | ORAL | Status: DC

## 2015-11-23 MED ORDER — ALPRAZOLAM 0.5 MG PO TABS: ORAL_TABLET | ORAL | Status: DC

## 2015-11-23 MED ORDER — ZOLPIDEM TARTRATE 5 MG PO TABS: 5.0000 mg | ORAL_TABLET | Freq: Every evening | ORAL | Status: DC | PRN

## 2015-11-23 NOTE — Progress Notes (Signed)
   Subjective:    Patient ID: Melanie Shepard, female    DOB: 1966-02-28, 49 y.o.   MRN: [id number]  HPI Patient is here today to discuss recent lab results. Results are in the system. Patient is having a lot of stress. Patient states that she was seen at [facility] ER a couple of weeks ago due to stress/panic attacks.  Patient relates she is under a lot of stress he states she has a hard time staying focused she finds herself feeling anxious she states at times tearful she denies being depressed. She also relates severe fatigue tiredness low energy no appetite.  She relates she is taking her pain medicine as directed she denies a causing her any troubles. It does help her with her chronic neck pain She is disabled She relates she cannot sleep well trazodone makes her feel bad sick she would like to try something different Review of Systems Patient denies palpitations she relates fatigue tiredness low energy she describes feeling anxious denies breathing difficulties.    Objective:   Physical Exam She is present here today with her husband Lungs are clear hearts regular pulse normal extremities no edema skin warm dry  25 minutes was spent with the patient. Greater than half the time was spent in discussion and answering questions and counseling regarding the issues that the patient came in for today.      Assessment & Plan:  hypothyroidism-this patient relates every time she takes medication either she doesn't sleep well or she feels the medicine is making her feel bad. Therefore she is stop taking the medicine on numerous occasions her thyroid is not regulated well I discussed with her in detail how this affects multiple different areas of her body. She agrees to retry Synthroid we will gradually increase the dose start off 50 g daily for the first week then 100 g daily for 2 weeks 150 g from there are repeat TSH free T4 in approximately 8 weeks with follow-up office visit-I have told her that I am  more than willing to set her up with endocrinology if she would like to see them for further opinion I believe that they would also instructed her that she must take medication. I share lab results with them and went over this with that.the patient was told that at any point in time she feels that the medicine is causing her trouble to call us and not just stop taking it without calling us. She was also told that we will be more than happy to set her up with endocrinology any time  Chronic neck pain and discomfort- The patient was seen today as part of a comprehensive visit regarding pain control. Patient's compliance with the medication as well as discussion regarding effectiveness was completed. Prescriptions were written. Patient was advised to follow-up in 3 months. The patient was assessed for any signs of severe side effects. The patient was advised to take the medicine as directed and to report to [place] if any side effect issues.   Insomnia Ambien at nighttime she was instructed to give this at least 8 hours towards she was also instructed not to take this with the pain medicine at bedtime. She is also instructed not to take this with nerve medication at that time.

## 2015-11-28 NOTE — Progress Notes (Signed)
Quick Note:  Normal GES. Needs to keep follow-up for thyroid. If she is still feeling bloated and vague symptoms, would recommend CT abd/pelvis with contrast. ______

## 2015-12-01 ENCOUNTER — Other Ambulatory Visit: Payer: Self-pay

## 2015-12-01 ENCOUNTER — Other Ambulatory Visit: Payer: Self-pay | Admitting: Gastroenterology

## 2015-12-01 DIAGNOSIS — R140 Abdominal distension (gaseous): Secondary | ICD-10-CM

## 2015-12-01 DIAGNOSIS — R1310 Dysphagia, unspecified: Secondary | ICD-10-CM

## 2015-12-01 DIAGNOSIS — R6881 Early satiety: Secondary | ICD-10-CM

## 2015-12-01 DIAGNOSIS — R109 Unspecified abdominal pain: Secondary | ICD-10-CM

## 2015-12-01 DIAGNOSIS — R110 Nausea: Secondary | ICD-10-CM

## 2015-12-01 MED ORDER — PREDNISONE 50 MG PO TABS: ORAL_TABLET | ORAL | Status: DC

## 2015-12-01 MED ORDER — DIPHENHYDRAMINE HCL 25 MG PO TABS: ORAL_TABLET | ORAL | Status: DC

## 2015-12-01 NOTE — Progress Notes (Signed)
Quick Note:  I sent in prescription to her pharmacy. Needs to take Prednisone 50 mg 13 hours before scan, 7 hours before scan, and 1 hour before scan. Also at 1 hour before scan, take 50 mg Benadryl. ______

## 2015-12-07 ENCOUNTER — Ambulatory Visit (HOSPITAL_COMMUNITY): Admission: RE | Admit: 2015-12-07 | Payer: BLUE CROSS/BLUE SHIELD | Source: Ambulatory Visit

## 2016-01-12 ENCOUNTER — Ambulatory Visit (HOSPITAL_COMMUNITY)
Admission: RE | Admit: 2016-01-12 | Discharge: 2016-01-12 | Disposition: A | Discharge status: Home / Self Care | Payer: Medicare Other | Source: Ambulatory Visit | Attending: Gastroenterology | Admitting: Gastroenterology

## 2016-01-12 DIAGNOSIS — K760 Fatty (change of) liver, not elsewhere classified: Secondary | ICD-10-CM | POA: Diagnosis not present

## 2016-01-12 DIAGNOSIS — R109 Unspecified abdominal pain: Secondary | ICD-10-CM

## 2016-01-12 DIAGNOSIS — R1012 Left upper quadrant pain: Secondary | ICD-10-CM | POA: Diagnosis not present

## 2016-01-12 DIAGNOSIS — R160 Hepatomegaly, not elsewhere classified: Secondary | ICD-10-CM | POA: Diagnosis not present

## 2016-01-12 DIAGNOSIS — R140 Abdominal distension (gaseous): Secondary | ICD-10-CM | POA: Diagnosis not present

## 2016-01-12 MED ORDER — IOHEXOL 300 MG/ML  SOLN
100.0000 mL | Freq: Once | INTRAMUSCULAR | Status: AC | PRN
  Administered 2016-01-12: 100 mL via INTRAVENOUS

## 2016-01-21 ENCOUNTER — Ambulatory Visit (INDEPENDENT_AMBULATORY_CARE_PROVIDER_SITE_OTHER): Payer: BLUE CROSS/BLUE SHIELD | Admitting: Family Medicine

## 2016-01-21 ENCOUNTER — Encounter: Payer: Self-pay | Admitting: Family Medicine

## 2016-01-21 VITALS — BP 120/88 | Ht 67.0 in | Wt 167.1 lb

## 2016-01-21 DIAGNOSIS — E038 Other specified hypothyroidism: Secondary | ICD-10-CM | POA: Diagnosis not present

## 2016-01-21 DIAGNOSIS — G8929 Other chronic pain: Secondary | ICD-10-CM | POA: Diagnosis not present

## 2016-01-21 DIAGNOSIS — F411 Generalized anxiety disorder: Secondary | ICD-10-CM

## 2016-01-21 DIAGNOSIS — K760 Fatty (change of) liver, not elsewhere classified: Secondary | ICD-10-CM

## 2016-01-21 DIAGNOSIS — Z720 Tobacco use: Secondary | ICD-10-CM | POA: Diagnosis not present

## 2016-01-21 DIAGNOSIS — F17200 Nicotine dependence, unspecified, uncomplicated: Secondary | ICD-10-CM

## 2016-01-21 DIAGNOSIS — G894 Chronic pain syndrome: Secondary | ICD-10-CM

## 2016-01-21 DIAGNOSIS — E781 Pure hyperglyceridemia: Secondary | ICD-10-CM | POA: Diagnosis not present

## 2016-01-21 DIAGNOSIS — M542 Cervicalgia: Secondary | ICD-10-CM | POA: Diagnosis not present

## 2016-01-21 MED ORDER — OXYCODONE HCL 10 MG PO TABS: ORAL_TABLET | ORAL | Status: DC

## 2016-01-21 MED ORDER — ONDANSETRON 8 MG PO TBDP: ORAL_TABLET | ORAL | Status: DC

## 2016-01-21 NOTE — Progress Notes (Signed)
   Subjective:    Patient ID: Melanie Shepard, female    DOB: [birth year], 50 y.o.   MRN: [id number]  HPI Patient is here today for a hypothyroidism follow up visit. Patient states that she has been feeling so bad. Patient thinks that she has too much tylenol in her system and would like to discuss a replacement she could take that is less harmful.    Patient is worried that too much Tylenol make her fatty liver get worse. She feels that she needs to go ahead and be on some without the Tylenol   She also feels that her thyroid medicine is making her feel bad but she notes she needs a take it she's frustrated she is willing to see another endocrinologist.   patient does have chronic pain and discomfort with neck pain medication is necessary to help keep this under control she does not abuse it  patient has history of stress and anxiety problems she does use Xanax intermittently but knows not to take it with pain medicine. She also knows that if any of her medicine makes her feel she is not to drive Review of Systems     She describes neck pain denies chest tightness pressure pain shortness breath denies nausea vomiting diarrhea relates she feels tired fatigue  Objective:   Physical Exam   lungs clear heart regular abdomen soft mild obesity neck thick but no obvious growth      Assessment & Plan:  Fatty liver-importance of exercise watching diet losing weight minimizing starches was discussed detail check liver profile also avoid acetaminophen. Switch pain medicine from current Percocet to plain oxycodone 10 mg #151 no greater than 5 times per day if she can get this filled by her insurance she will call back and we will issue 2 additional prescriptions  Chronic pain-see discussion above follow-up in 3 months.   hypothyroidism-importance of medication was discussed we'll check lab work if this is abnormal probably get endocrinology involved. Patient very hesitant to go on higher dose she feels that  the medication is making her sicker. Yet her TSH is been significantly elevated.

## 2016-01-22 LAB — HEPATIC FUNCTION PANEL
ALBUMIN: 4.9 g/dL (ref 3.5–5.5)
ALK PHOS: 99 IU/L (ref 39–117)
ALT: 31 IU/L (ref 0–32)
AST: 24 IU/L (ref 0–40)
BILIRUBIN, DIRECT: 0.17 mg/dL (ref 0.00–0.40)
Bilirubin Total: 0.5 mg/dL (ref 0.0–1.2)
TOTAL PROTEIN: 7.5 g/dL (ref 6.0–8.5)

## 2016-01-22 LAB — LIPID PANEL
Chol/HDL Ratio: 15.1 ratio units — ABNORMAL HIGH (ref 0.0–4.4)
Cholesterol, Total: 377 mg/dL — ABNORMAL HIGH (ref 100–199)
HDL: 25 mg/dL — ABNORMAL LOW (ref >39)
Triglycerides: 598 mg/dL (ref 0–149)

## 2016-01-22 LAB — TSH: TSH: 47.06 u[IU]/mL — AB (ref 0.450–4.500)

## 2016-01-22 LAB — T4, FREE: FREE T4: 0.94 ng/dL (ref 0.82–1.77)

## 2016-01-25 ENCOUNTER — Encounter: Payer: Self-pay | Admitting: Family Medicine

## 2016-01-25 NOTE — Progress Notes (Signed)
Quick Note:  CT reviewed. Enlarged liver, fatty liver. Would recommend elastography for further characterization.     ______

## 2016-01-25 NOTE — Addendum Note (Signed)
Addended by: Carmelina Noun on: 01/25/2016 11:27 AM   Modules accepted: Orders

## 2016-02-04 MED ORDER — FENOFIBRATE 160 MG PO TABS: 160.0000 mg | ORAL_TABLET | Freq: Every day | ORAL | Status: DC

## 2016-02-04 NOTE — Addendum Note (Signed)
Addended by: Ofilia Neas R on: 02/04/2016 04:01 PM   Modules accepted: Orders

## 2016-02-07 ENCOUNTER — Other Ambulatory Visit: Payer: Self-pay

## 2016-02-07 MED ORDER — OXYCODONE HCL 10 MG PO TABS: ORAL_TABLET | ORAL | Status: DC

## 2016-02-08 ENCOUNTER — Ambulatory Visit (INDEPENDENT_AMBULATORY_CARE_PROVIDER_SITE_OTHER): Payer: BLUE CROSS/BLUE SHIELD | Admitting: Gastroenterology

## 2016-02-08 ENCOUNTER — Encounter: Payer: Self-pay | Admitting: Gastroenterology

## 2016-02-08 VITALS — BP 108/78 | HR 103 | Temp 98.0°F | Ht 67.0 in | Wt 166.0 lb

## 2016-02-08 DIAGNOSIS — R110 Nausea: Secondary | ICD-10-CM | POA: Diagnosis not present

## 2016-02-08 DIAGNOSIS — R160 Hepatomegaly, not elsewhere classified: Secondary | ICD-10-CM

## 2016-02-08 MED ORDER — PROMETHAZINE HCL 25 MG PO TABS: 25.0000 mg | ORAL_TABLET | Freq: Four times a day (QID) | ORAL | Status: DC | PRN

## 2016-02-08 NOTE — Progress Notes (Signed)
CC'ED TO PCP 

## 2016-02-08 NOTE — Patient Instructions (Signed)
Please complete the ultrasound and blood work.  We will be in touch with results.  I am glad you are seeing Dr. Dorris Fetch!
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
## 2016-02-08 NOTE — Assessment & Plan Note (Signed)
EGD on file, GES normal. I feel TSH abnormalities likely contributing to symptoms. Appt with Dr. Dorris Fetch upcoming. Zofran prn nausea and Phenergan prescription sent in.

## 2016-02-08 NOTE — Assessment & Plan Note (Signed)
In setting of fatty liver, noted on CT. Will proceed with elastography. Recent LFTs normal. Low threshold for liver biopsy if any evidence of significant fibrosis. Check Hep C antibody for screening purposes.

## 2016-02-14 ENCOUNTER — Ambulatory Visit (HOSPITAL_COMMUNITY): Admission: RE | Admit: 2016-02-14 | Payer: BLUE CROSS/BLUE SHIELD | Source: Ambulatory Visit

## 2016-02-16 ENCOUNTER — Ambulatory Visit (INDEPENDENT_AMBULATORY_CARE_PROVIDER_SITE_OTHER): Payer: Medicare Other | Admitting: "Endocrinology

## 2016-02-16 ENCOUNTER — Encounter: Payer: Self-pay | Admitting: "Endocrinology

## 2016-02-16 VITALS — BP 105/74 | HR 100 | Ht 67.0 in | Wt 168.0 lb

## 2016-02-16 DIAGNOSIS — E039 Hypothyroidism, unspecified: Secondary | ICD-10-CM | POA: Diagnosis not present

## 2016-02-16 DIAGNOSIS — R7303 Prediabetes: Secondary | ICD-10-CM

## 2016-02-16 DIAGNOSIS — E785 Hyperlipidemia, unspecified: Secondary | ICD-10-CM

## 2016-02-16 DIAGNOSIS — E119 Type 2 diabetes mellitus without complications: Secondary | ICD-10-CM | POA: Insufficient documentation

## 2016-02-16 HISTORY — DX: Type 2 diabetes mellitus without complications: E11.9
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
## 2016-02-18 ENCOUNTER — Ambulatory Visit (HOSPITAL_COMMUNITY)
Admission: RE | Admit: 2016-02-18 | Discharge: 2016-02-18 | Disposition: A | Discharge status: Home / Self Care | Payer: Medicare Other | Source: Ambulatory Visit | Attending: Gastroenterology | Admitting: Gastroenterology

## 2016-02-18 ENCOUNTER — Other Ambulatory Visit: Payer: Self-pay | Admitting: Gastroenterology

## 2016-02-18 DIAGNOSIS — R160 Hepatomegaly, not elsewhere classified: Secondary | ICD-10-CM | POA: Insufficient documentation

## 2016-02-18 DIAGNOSIS — K760 Fatty (change of) liver, not elsewhere classified: Secondary | ICD-10-CM | POA: Diagnosis not present

## 2016-02-19 LAB — HEPATITIS C ANTIBODY: Hep C Virus Ab: <0.1 s/co ratio (ref 0.0–0.9)

## 2016-03-07 NOTE — Progress Notes (Signed)
Quick Note:    Negative for Hep C  ______

## 2016-03-07 NOTE — Progress Notes (Signed)
Quick Note:  No cirrhosis. She has an elevated fibrosis score of F2/F3. Likely fatty liver. Her LFTs are normal. In absence of bump in LFTs, I would monitor with an elastography yearly and LFTs every 6 months. Work on diet, exercise, avoidance of ETOH. ______

## 2016-03-08 NOTE — Progress Notes (Signed)
Quick Note:  LMOM to call. ______ 

## 2016-03-11 ENCOUNTER — Other Ambulatory Visit: Payer: Self-pay | Admitting: Nurse Practitioner

## 2016-03-13 NOTE — Progress Notes (Signed)
Quick Note:  Routing to Julie, this is RMR pt. ______ 

## 2016-03-13 NOTE — Progress Notes (Signed)
Quick Note:  PT's husband , [name], left VM to call him about his wife's results @ [phone]. ______

## 2016-03-15 ENCOUNTER — Other Ambulatory Visit: Payer: Self-pay | Admitting: Gastroenterology

## 2016-03-15 DIAGNOSIS — R160 Hepatomegaly, not elsewhere classified: Secondary | ICD-10-CM

## 2016-03-31 ENCOUNTER — Ambulatory Visit: Payer: Medicare Other | Admitting: "Endocrinology

## 2016-03-31 DIAGNOSIS — E039 Hypothyroidism, unspecified: Secondary | ICD-10-CM | POA: Diagnosis not present

## 2016-04-01 LAB — THYROID PEROXIDASE ANTIBODY: THYROID PEROXIDASE ANTIBODY: 597 [IU]/mL — AB (ref 0–34)

## 2016-04-01 LAB — TSH: TSH: 17.3 u[IU]/mL — ABNORMAL HIGH (ref 0.450–4.500)

## 2016-04-01 LAB — THYROGLOBULIN ANTIBODY: THYROGLOBULIN ANTIBODY: 263.2 [IU]/mL — AB (ref 0.0–0.9)

## 2016-04-01 LAB — T4, FREE: FREE T4: 1 ng/dL (ref 0.82–1.77)

## 2016-04-06 ENCOUNTER — Ambulatory Visit (INDEPENDENT_AMBULATORY_CARE_PROVIDER_SITE_OTHER): Payer: Medicare Other | Admitting: "Endocrinology

## 2016-04-06 ENCOUNTER — Encounter: Payer: Self-pay | Admitting: "Endocrinology

## 2016-04-06 VITALS — BP 131/82 | HR 80 | Ht 67.0 in | Wt 171.0 lb

## 2016-04-06 DIAGNOSIS — E785 Hyperlipidemia, unspecified: Secondary | ICD-10-CM | POA: Diagnosis not present

## 2016-04-06 DIAGNOSIS — E039 Hypothyroidism, unspecified: Secondary | ICD-10-CM

## 2016-04-06 DIAGNOSIS — R7303 Prediabetes: Secondary | ICD-10-CM | POA: Diagnosis not present

## 2016-04-06 MED ORDER — LEVOTHYROXINE SODIUM 175 MCG PO TABS: 175.0000 ug | ORAL_TABLET | Freq: Every day | ORAL | Status: DC
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
## 2016-04-07 ENCOUNTER — Telehealth: Payer: Self-pay

## 2016-04-07 MED ORDER — LEVOTHYROXINE SODIUM 150 MCG PO TABS: 150.0000 ug | ORAL_TABLET | Freq: Every day | ORAL | Status: DC

## 2016-04-07 NOTE — Telephone Encounter (Signed)
If she was taking levothyroxine 125 g before yesterday's visit, she should be on 150 instead of 175 g until next visit.

## 2016-04-07 NOTE — Telephone Encounter (Signed)
Pt states that she did not want to let Dr Dorris Fetch know that she was taking Levothyroxine 124mcg instead of 150 mcg because the 150 was causing insomnia. She is concerned about taking the Synthroid 175 mcg since she was only taking 125 mcg. Which dosage should she be taking?

## 2016-04-07 NOTE — Telephone Encounter (Signed)
Pt.notified

## 2016-04-12 ENCOUNTER — Encounter: Payer: Self-pay | Admitting: Family Medicine

## 2016-04-12 ENCOUNTER — Telehealth: Payer: Self-pay | Admitting: *Deleted

## 2016-04-12 NOTE — Telephone Encounter (Signed)
Please let the patient know that we did send in a letter of appeal. But unfortunately we do not have the power to make her insurance company covering the medication. If it is not going to be covered then the only way she can get that particular medicine for nausea is by paying for it out of pocket. The appeal is been sent in this week then hopefully the patient will hear back within the next few weeks. Nurse's-the letter is being sent to Randall Hiss she is going to give the letter to you all then please send this in via appeal protocol

## 2016-04-12 NOTE — Telephone Encounter (Signed)
Patient's Ondansetron 8 mg odt #30 one q 6 hrs prn nausea and vomiting was denied by her insurance([employer]). See denial in folder

## 2016-04-13 NOTE — Telephone Encounter (Signed)
[facility] 04/13/16

## 2016-04-17 NOTE — Telephone Encounter (Signed)
Mailbox full 04/17/16

## 2016-04-18 ENCOUNTER — Ambulatory Visit (INDEPENDENT_AMBULATORY_CARE_PROVIDER_SITE_OTHER): Payer: BLUE CROSS/BLUE SHIELD | Admitting: Family Medicine

## 2016-04-18 ENCOUNTER — Encounter: Payer: Self-pay | Admitting: Family Medicine

## 2016-04-18 VITALS — BP 110/76 | Ht 67.0 in | Wt 167.0 lb

## 2016-04-18 DIAGNOSIS — G894 Chronic pain syndrome: Secondary | ICD-10-CM

## 2016-04-18 MED ORDER — OXYCODONE HCL 10 MG PO TABS: ORAL_TABLET | ORAL | Status: DC

## 2016-04-18 MED ORDER — OXYCODONE HCL 10 MG PO TABS
ORAL_TABLET | ORAL | Status: DC
Start: 2016-04-18 — End: 2016-07-18

## 2016-04-18 MED ORDER — ALPRAZOLAM 0.5 MG PO TABS: ORAL_TABLET | ORAL | Status: DC

## 2016-04-18 NOTE — Telephone Encounter (Signed)
Patient seen in office 04/18/16

## 2016-04-18 NOTE — Progress Notes (Signed)
   Subjective:    Patient ID: Melanie Shepard, female    DOB: 1966/08/03, 50 y.o.   MRN: [id number]  Hyperlipidemia This is a chronic problem. The current episode started more than 1 year ago. There are no compliance problems.    Patient states no other concerns this visit.  patient denies abusing pain medicine. Takes no more than 5 per day. States it seems to keep the pain within reason. Denies any other particular troubles currently needs her prescriptions on pain medicine.   Review of Systems  patient relates neck pain radiates into both shoulders worse on left than right pain medicine helps    Objective:   Physical Exam   subjective neck discomfort. Lungs clear heart regular      Assessment & Plan:   chronic neck pain-3 prescriptions given proper way to take it discussed no greater than 5 per day follow-up 3 months  Follow-up with specialists for thyroid  Follow-up with specialist on fatty liver try to lose at least 20 pounds over the course of next 6 months

## 2016-04-24 ENCOUNTER — Telehealth: Payer: Self-pay | Admitting: *Deleted

## 2016-04-24 MED ORDER — ONDANSETRON 8 MG PO TBDP: 8.0000 mg | ORAL_TABLET | Freq: Three times a day (TID) | ORAL | Status: DC | PRN

## 2016-04-24 NOTE — Telephone Encounter (Signed)
plz redo her rx as 8mg  one tid prn #30 5 refills thanks

## 2016-04-24 NOTE — Telephone Encounter (Signed)
Patient's insurance will pay for zofran every 8 hrs as label indicated and FDA recommended (30 pills per 10 days)-original rx was for zofran every 6 hrs 30 for 7 days and it will not be covered at this off label dosing see letter

## 2016-04-24 NOTE — Telephone Encounter (Signed)
Rx sent electronically to pharmacy. Patient notified. 

## 2016-04-24 NOTE — Addendum Note (Signed)
Addended by: Dairl Ponder on: 04/24/2016 03:37 PM   Modules accepted: Orders

## 2016-04-29 ENCOUNTER — Other Ambulatory Visit: Payer: Self-pay | Admitting: Family Medicine

## 2016-05-02 NOTE — Telephone Encounter (Signed)
May give this plus for additional refills

## 2016-05-03 ENCOUNTER — Other Ambulatory Visit: Payer: Self-pay

## 2016-06-28 ENCOUNTER — Emergency Department (HOSPITAL_COMMUNITY): Payer: Medicare Other

## 2016-06-28 ENCOUNTER — Other Ambulatory Visit: Payer: Self-pay | Admitting: "Endocrinology

## 2016-06-28 ENCOUNTER — Telehealth: Payer: Self-pay | Admitting: Cardiovascular Disease

## 2016-06-28 ENCOUNTER — Encounter (HOSPITAL_COMMUNITY): Payer: Self-pay | Admitting: Emergency Medicine

## 2016-06-28 ENCOUNTER — Emergency Department (HOSPITAL_COMMUNITY)
Admission: EM | Admit: 2016-06-28 | Discharge: 2016-06-28 | Discharge status: Against Medical Advice | Payer: Medicare Other | Attending: Emergency Medicine | Admitting: Emergency Medicine

## 2016-06-28 DIAGNOSIS — I10 Essential (primary) hypertension: Secondary | ICD-10-CM | POA: Diagnosis not present

## 2016-06-28 DIAGNOSIS — F17210 Nicotine dependence, cigarettes, uncomplicated: Secondary | ICD-10-CM | POA: Diagnosis not present

## 2016-06-28 DIAGNOSIS — R079 Chest pain, unspecified: Secondary | ICD-10-CM | POA: Diagnosis not present

## 2016-06-28 DIAGNOSIS — Z7982 Long term (current) use of aspirin: Secondary | ICD-10-CM | POA: Insufficient documentation

## 2016-06-28 DIAGNOSIS — Z79899 Other long term (current) drug therapy: Secondary | ICD-10-CM | POA: Insufficient documentation

## 2016-06-28 DIAGNOSIS — M1611 Unilateral primary osteoarthritis, right hip: Secondary | ICD-10-CM | POA: Insufficient documentation

## 2016-06-28 DIAGNOSIS — R05 Cough: Secondary | ICD-10-CM | POA: Diagnosis not present

## 2016-06-28 DIAGNOSIS — E039 Hypothyroidism, unspecified: Secondary | ICD-10-CM | POA: Diagnosis not present

## 2016-06-28 DIAGNOSIS — R0789 Other chest pain: Secondary | ICD-10-CM | POA: Insufficient documentation

## 2016-06-28 DIAGNOSIS — R7303 Prediabetes: Secondary | ICD-10-CM | POA: Diagnosis not present

## 2016-06-28 LAB — CBC
HEMATOCRIT: 37.1 % (ref 36.0–46.0)
HEMOGLOBIN: 12.5 g/dL (ref 12.0–15.0)
MCH: 36.9 pg — AB (ref 26.0–34.0)
MCHC: 33.7 g/dL (ref 30.0–36.0)
MCV: 109.4 fL — AB (ref 78.0–100.0)
Platelets: 277 10*3/uL (ref 150–400)
RBC: 3.39 MIL/uL — ABNORMAL LOW (ref 3.87–5.11)
RDW: 15.3 % (ref 11.5–15.5)
WBC: 13.2 10*3/uL — ABNORMAL HIGH (ref 4.0–10.5)

## 2016-06-28 LAB — BASIC METABOLIC PANEL
Anion gap: 7 (ref 5–15)
BUN: 5 mg/dL — ABNORMAL LOW (ref 6–20)
CALCIUM: 9.1 mg/dL (ref 8.9–10.3)
CHLORIDE: 101 mmol/L (ref 101–111)
CO2: 27 mmol/L (ref 22–32)
CREATININE: 0.8 mg/dL (ref 0.44–1.00)
GFR calc Af Amer: >60 mL/min (ref >60)
GFR calc non Af Amer: >60 mL/min (ref >60)
GLUCOSE: 146 mg/dL — AB (ref 65–99)
Potassium: 3.4 mmol/L — ABNORMAL LOW (ref 3.5–5.1)
Sodium: 135 mmol/L (ref 135–145)

## 2016-06-28 LAB — TROPONIN I
Troponin I: <0.03 ng/mL (ref <0.03)
Troponin I: <0.03 ng/mL (ref <0.03)

## 2016-06-28 LAB — D-DIMER, QUANTITATIVE (NOT AT ARMC): D DIMER QUANT: 0.4 ug{FEU}/mL (ref 0.00–0.50)

## 2016-06-28 MED ORDER — MORPHINE SULFATE (PF) 4 MG/ML IV SOLN
4.0000 mg | Freq: Once | INTRAVENOUS | Status: AC
  Administered 2016-06-28: 4 mg via INTRAVENOUS
  Filled 2016-06-28: qty 1
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
## 2016-06-28 NOTE — ED Notes (Signed)
Patient states Sunday she was diaphoretic and started having mid sternal chest pain radiating into back. States pain has been off and on since Sunday.

## 2016-06-28 NOTE — ED Notes (Signed)
Saturday night out in mall got sweaty, went home.. Once at home got cold.. Sunday AM points to midsternal chest pain that has been intermitt since then .Marland Kitchen Today called heart MD and was directed to come to ER vs waiting.  Points to midsternal chest pain of 6-7 out of 10. Upper back pain of 8 of 10. Reports nausea since yesterday.

## 2016-06-28 NOTE — ED Notes (Signed)
Pt requested her iv be taken out and that she wanted to leave.  Dr Sabra Heck made aware of same.

## 2016-06-28 NOTE — ED Notes (Signed)
MD at bedside. 

## 2016-06-28 NOTE — Telephone Encounter (Signed)
She should indeed go to ED if symptoms are ongoing. Unfortunately, I have no earlier openings in clinic. We can check TSH and T4 to see if that is the reason for her symptoms.

## 2016-06-28 NOTE — ED Notes (Signed)
Lab at bedside to redraw troponin

## 2016-06-28 NOTE — Telephone Encounter (Signed)
Attempted to return call to patient - no DPR on file for husband VM box full  Called husband - patient is with him   Spoke with patient.  Saturday night - had been trying on clothes - broke out into a massive sweat - came home, was freezing (she is never cold) Sunday - chest sort of was hurting, burning, uncomfortable (sometimes more toward right side but on left side as well) These symptoms have been going off and on since Saturday She had back (in between shoulder blades, like the pain is going thru her chest to her back) & shoulder pain & arm pain She denies shortness of breath, palpitations, lightheadedness/dizziness Patient has stayed at home in her recliner since the weekend, as she has not felt really good   Patient took a couple bayer aspirin (chewed 3 tablets initially) and has taken 1 daily since - has taken her pain medication as well   Patient feels a little anxious/panicky - she has taken her xanax in the evening to help her symptoms  Patient states she has a "real bad thyroid" that causes some of her problems.   She states she does not want to go to EKG cause her EKGs are bad every time and they keep her  Her brother passed away @ 92 with an MI and her mom had an MI  Advised patient to go to ED for evaluation but that I would also send the message to Dr. Sallyanne Kuster for advice. She agreed with plan.

## 2016-06-28 NOTE — Telephone Encounter (Signed)
New Message  Pt c/o of Chest Pain: STAT if CP now or developed within 24 hours  1. Are you having CP right now? No   2. Are you experiencing any other symptoms (ex. SOB, nausea, vomiting, sweating)? Pt husband states Saturday sweating   3. How long have you been experiencing CP?  yes  4. Is your CP continuous or coming and going? Yes   5. Have you taken Nitroglycerin? No  ? Spoke with Pt Husband. He stated he wanted pt to be seen sooner that avail appt for provider and [facility]. Please call back to discuss

## 2016-06-28 NOTE — Discharge Instructions (Signed)

## 2016-06-28 NOTE — ED Notes (Signed)
Patient's husbands came to nursing station stating that his wife is very anxious and pulling her leads off and wanting to leave. Spoke with patient she agreed to stay a few more minutes to get troponin draw. MD and RN made aware.

## 2016-06-29 LAB — HGB A1C W/O EAG: HEMOGLOBIN A1C: 6.6 % — AB (ref 4.8–5.6)

## 2016-06-29 LAB — TSH: TSH: 14 u[IU]/mL — AB (ref 0.450–4.500)

## 2016-06-29 LAB — T4, FREE: FREE T4: 1.2 ng/dL (ref 0.82–1.77)

## 2016-06-29 NOTE — Telephone Encounter (Signed)
Patient went to ED 06/28/16

## 2016-07-05 ENCOUNTER — Ambulatory Visit (INDEPENDENT_AMBULATORY_CARE_PROVIDER_SITE_OTHER): Payer: Medicare Other | Admitting: "Endocrinology

## 2016-07-05 ENCOUNTER — Encounter: Payer: Self-pay | Admitting: "Endocrinology

## 2016-07-05 VITALS — BP 124/68 | HR 96 | Resp 18 | Ht 67.0 in | Wt 165.1 lb

## 2016-07-05 DIAGNOSIS — E038 Other specified hypothyroidism: Secondary | ICD-10-CM | POA: Diagnosis not present

## 2016-07-05 DIAGNOSIS — E119 Type 2 diabetes mellitus without complications: Secondary | ICD-10-CM | POA: Diagnosis not present

## 2016-07-05 DIAGNOSIS — E785 Hyperlipidemia, unspecified: Secondary | ICD-10-CM

## 2016-07-05 MED ORDER — LEVOTHYROXINE SODIUM 175 MCG PO TABS: 175.0000 ug | ORAL_TABLET | Freq: Every day | ORAL | 3 refills | Status: DC
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
## 2016-07-18 ENCOUNTER — Encounter: Payer: Self-pay | Admitting: Family Medicine

## 2016-07-18 ENCOUNTER — Ambulatory Visit (INDEPENDENT_AMBULATORY_CARE_PROVIDER_SITE_OTHER): Payer: Medicare Other | Admitting: Family Medicine

## 2016-07-18 VITALS — BP 122/82 | Ht 67.0 in | Wt 163.6 lb

## 2016-07-18 DIAGNOSIS — Z79891 Long term (current) use of opiate analgesic: Secondary | ICD-10-CM | POA: Diagnosis not present

## 2016-07-18 DIAGNOSIS — R748 Abnormal levels of other serum enzymes: Secondary | ICD-10-CM

## 2016-07-18 MED ORDER — OXYCODONE HCL 10 MG PO TABS
ORAL_TABLET | ORAL | 0 refills | Status: DC
Start: 2016-07-18 — End: 2016-11-08

## 2016-07-18 NOTE — Progress Notes (Signed)
   Subjective:    Patient ID: Melanie Shepard, female    DOB: 10-Sep-1966, 50 y.o.   MRN: [id number]  HPI This patient was seen today for chronic pain  The medication list was reviewed and updated.   -Compliance with medication: yes  - Number patient states they take daily: no more than 5 a day  -when was the last dose patient took? today  The patient was advised the importance of maintaining medication and not using illegal substances with these.  Refills needed: yes  The patient was educated that we can provide 3 monthly scripts for their medication, it is their responsibility to follow the instructions.  Side effects or complications from medications: none  Patient is aware that pain medications are meant to minimize the severity of the pain to allow their pain levels to improve to allow for better function. They are aware of that pain medications cannot totally remove their pain.  Due for UDT ( at least once per year) : today Patient is being followed by Dr. Dorris Fetch for thyroid as well as diabetes. She has some questions about this but she is going through diabetic education as well as exercise try to help this. She denies any other problems with this,  Long discussion held today regarding safety of neve medication with pain medication.  Patient was told there is a 40% increased risk of accidental overdose with combination to medications. Review of Systems She denies any chest tightness pressure pain shortness breath nausea vomiting diarrhea fever chills    Objective:   Physical Exam Lungs clear hearts regular pulse normal BP good extremities no edema significant subjective pain discomfort 25 minutes was spent with the patient. Greater than half the time was spent in discussion and answering questions and counseling regarding the issues that the patient came in for today.       Assessment & Plan:  Long discussion held with patient Patient feels that her pain is the most  important part to treat She would like to continue with pain medication and consider going to the oxycodone with Tylenol. She will check liver profile. One prescription given today. Future prescriptions may have Tylenol liver function looks good.  Patient agrees to start tapering. 0.5 mg Xanax one half tablet 3 times a day.  We will be 0.25 mg twice a day recheck the patient 3 months  After that we will be tapering off of Xanax

## 2016-07-21 ENCOUNTER — Encounter: Payer: Self-pay | Admitting: Nutrition

## 2016-07-21 ENCOUNTER — Encounter: Payer: Medicare Other | Attending: "Endocrinology | Admitting: Nutrition

## 2016-07-21 VITALS — Ht 66.5 in | Wt 166.0 lb

## 2016-07-21 DIAGNOSIS — IMO0002 Reserved for concepts with insufficient information to code with codable children: Secondary | ICD-10-CM

## 2016-07-21 DIAGNOSIS — E119 Type 2 diabetes mellitus without complications: Secondary | ICD-10-CM | POA: Insufficient documentation

## 2016-07-21 DIAGNOSIS — Z713 Dietary counseling and surveillance: Secondary | ICD-10-CM | POA: Insufficient documentation

## 2016-07-21 DIAGNOSIS — E118 Type 2 diabetes mellitus with unspecified complications: Secondary | ICD-10-CM

## 2016-07-21 DIAGNOSIS — E1165 Type 2 diabetes mellitus with hyperglycemia: Secondary | ICD-10-CM

## 2016-07-21 NOTE — Patient Instructions (Signed)
Goals 1. FOllow My Plate 2. Get up at 8 am to eat breakfast 3. 100% whole wheat bread 4. Increase fresh fruits and low carb vegetables. 5. Do not skip meals 6. Drink only water 7. Walk 30 minutes three days.

## 2016-07-25 LAB — TOXASSURE SELECT 13 (MW), URINE: PDF: 0

## 2016-07-28 ENCOUNTER — Other Ambulatory Visit: Payer: Self-pay

## 2016-08-04 NOTE — Progress Notes (Signed)
Diabetes Self-Management Education  Visit Type: First/Initial  Appt. Start Time: 1530 Appt. End Time: 1640  07/21/16 [name], identified by name and date of birth, is a 50 y.o. female with a diagnosis of Diabetes: Type 2.  Not on any  Medications for Dm yet. Hoping to control with weight loss and exercise and eating habits. ASSESSMENT  Height 5' 6.5" (1.689 m), weight 166 lb (75.3 kg). Body mass index is 26.39 kg/m.    Lab Results  Component Value Date   HGBA1C 6.6 (H) 06/28/2016     Individualized Plan for Diabetes Self-Management Training:   Learning Objective:  Patient will have a greater understanding of diabetes self-management. Patient education plan is to attend individual and/or group sessions per assessed needs and concerns.   Plan:   Patient Instructions  Goals 1. FOllow My Plate 2. Get up at 8 am to eat breakfast 3. 100% whole wheat bread 4. Increase fresh fruits and low carb vegetables. 5. Do not skip meals 6. Drink only water 7. Walk 30 minutes three days.   Expected Outcomes:  Demonstrated interest in learning. Expect positive outcomes  Education material provided: Living Well with Diabetes, Food label handouts, A1C conversion sheet, Meal plan card and My Plate  If problems or questions, patient to contact team via:  Phone and Email  Future DSME appointment: 4-6 wks

## 2016-08-07 ENCOUNTER — Other Ambulatory Visit: Payer: Self-pay

## 2016-08-07 DIAGNOSIS — R160 Hepatomegaly, not elsewhere classified: Secondary | ICD-10-CM

## 2016-08-09 ENCOUNTER — Telehealth: Payer: Self-pay | Admitting: Internal Medicine

## 2016-08-09 ENCOUNTER — Telehealth: Payer: Self-pay | Admitting: Family Medicine

## 2016-08-09 NOTE — Telephone Encounter (Signed)
Lab order has been mailed to the pt.  

## 2016-08-09 NOTE — Telephone Encounter (Signed)
October RECALL FOR LFT'S

## 2016-08-09 NOTE — Telephone Encounter (Signed)
The certified letter regarding patients' urine drug screen has been sent to [facility] for postage.

## 2016-08-15 ENCOUNTER — Telehealth: Payer: Self-pay | Admitting: Family Medicine

## 2016-08-15 NOTE — Telephone Encounter (Signed)
We have received certified mail receipt signed by Chaney Born on 08/11/16 regarding patients' medication.

## 2016-08-21 ENCOUNTER — Ambulatory Visit: Payer: Medicare Other | Admitting: Nutrition

## 2016-08-30 ENCOUNTER — Ambulatory Visit: Payer: Medicare Other | Admitting: Nutrition

## 2016-10-10 DIAGNOSIS — G8929 Other chronic pain: Secondary | ICD-10-CM | POA: Diagnosis not present

## 2016-10-10 DIAGNOSIS — G4700 Insomnia, unspecified: Secondary | ICD-10-CM | POA: Diagnosis not present

## 2016-10-10 DIAGNOSIS — M1990 Unspecified osteoarthritis, unspecified site: Secondary | ICD-10-CM | POA: Diagnosis not present

## 2016-10-10 DIAGNOSIS — E785 Hyperlipidemia, unspecified: Secondary | ICD-10-CM | POA: Diagnosis not present

## 2016-10-10 DIAGNOSIS — F329 Major depressive disorder, single episode, unspecified: Secondary | ICD-10-CM | POA: Diagnosis not present

## 2016-10-10 DIAGNOSIS — F419 Anxiety disorder, unspecified: Secondary | ICD-10-CM | POA: Diagnosis not present

## 2016-10-12 ENCOUNTER — Ambulatory Visit: Payer: Medicare Other | Admitting: "Endocrinology

## 2016-10-26 ENCOUNTER — Ambulatory Visit: Payer: Medicare Other | Admitting: "Endocrinology

## 2016-11-01 DIAGNOSIS — E119 Type 2 diabetes mellitus without complications: Secondary | ICD-10-CM | POA: Diagnosis not present

## 2016-11-01 DIAGNOSIS — E038 Other specified hypothyroidism: Secondary | ICD-10-CM | POA: Diagnosis not present

## 2016-11-01 DIAGNOSIS — E785 Hyperlipidemia, unspecified: Secondary | ICD-10-CM | POA: Diagnosis not present

## 2016-11-02 LAB — CMP14+EGFR
A/G RATIO: 1.7 (ref 1.2–2.2)
ALBUMIN: 5.3 g/dL (ref 3.5–5.5)
ALK PHOS: 161 IU/L — AB (ref 39–117)
ALT: 126 IU/L — ABNORMAL HIGH (ref 0–32)
AST: 70 IU/L — AB (ref 0–40)
BUN / CREAT RATIO: 5 — AB (ref 9–23)
BUN: 4 mg/dL — ABNORMAL LOW (ref 6–24)
Bilirubin Total: 0.6 mg/dL (ref 0.0–1.2)
CALCIUM: 10.7 mg/dL — AB (ref 8.7–10.2)
CO2: 27 mmol/L (ref 18–29)
Chloride: 99 mmol/L (ref 96–106)
Creatinine, Ser: 0.87 mg/dL (ref 0.57–1.00)
GFR calc Af Amer: 90 mL/min/{1.73_m2} (ref >59)
GFR, EST NON AFRICAN AMERICAN: 78 mL/min/{1.73_m2} (ref >59)
GLOBULIN, TOTAL: 3.1 g/dL (ref 1.5–4.5)
Glucose: 143 mg/dL — ABNORMAL HIGH (ref 65–99)
POTASSIUM: 4.2 mmol/L (ref 3.5–5.2)
SODIUM: 144 mmol/L (ref 134–144)
Total Protein: 8.4 g/dL (ref 6.0–8.5)

## 2016-11-02 LAB — HEMOGLOBIN A1C
ESTIMATED AVERAGE GLUCOSE: 163 mg/dL
Hgb A1c MFr Bld: 7.3 % — ABNORMAL HIGH (ref 4.8–5.6)

## 2016-11-02 LAB — LIPID PANEL
CHOL/HDL RATIO: 10.1 ratio — AB (ref 0.0–4.4)
Cholesterol, Total: 334 mg/dL — ABNORMAL HIGH (ref 100–199)
HDL: 33 mg/dL — ABNORMAL LOW (ref >39)
Triglycerides: 468 mg/dL — ABNORMAL HIGH (ref 0–149)

## 2016-11-02 LAB — T4, FREE: FREE T4: 2.17 ng/dL — AB (ref 0.82–1.77)

## 2016-11-02 LAB — TSH: TSH: 0.588 u[IU]/mL (ref 0.450–4.500)

## 2016-11-08 ENCOUNTER — Encounter: Payer: Self-pay | Admitting: "Endocrinology

## 2016-11-08 ENCOUNTER — Ambulatory Visit (INDEPENDENT_AMBULATORY_CARE_PROVIDER_SITE_OTHER): Payer: Medicare Other | Admitting: "Endocrinology

## 2016-11-08 VITALS — BP 113/79 | HR 94 | Ht 67.0 in | Wt 161.0 lb

## 2016-11-08 DIAGNOSIS — E063 Autoimmune thyroiditis: Secondary | ICD-10-CM

## 2016-11-08 DIAGNOSIS — E782 Mixed hyperlipidemia: Secondary | ICD-10-CM | POA: Diagnosis not present

## 2016-11-08 DIAGNOSIS — E119 Type 2 diabetes mellitus without complications: Secondary | ICD-10-CM

## 2016-11-08 DIAGNOSIS — E038 Other specified hypothyroidism: Secondary | ICD-10-CM

## 2016-11-08 MED ORDER — LEVOTHYROXINE SODIUM 150 MCG PO TABS: 150.0000 ug | ORAL_TABLET | Freq: Every day | ORAL | 6 refills | Status: DC

## 2016-11-08 MED ORDER — METFORMIN HCL 500 MG PO TABS: 500.0000 mg | ORAL_TABLET | Freq: Two times a day (BID) | ORAL | 6 refills | Status: DC

## 2016-11-08 NOTE — Patient Instructions (Signed)
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
## 2016-11-16 ENCOUNTER — Other Ambulatory Visit: Payer: Self-pay | Admitting: Gastroenterology

## 2017-01-11 DIAGNOSIS — E785 Hyperlipidemia, unspecified: Secondary | ICD-10-CM | POA: Diagnosis not present

## 2017-01-11 DIAGNOSIS — G894 Chronic pain syndrome: Secondary | ICD-10-CM | POA: Diagnosis not present

## 2017-01-11 DIAGNOSIS — D7589 Other specified diseases of blood and blood-forming organs: Secondary | ICD-10-CM | POA: Diagnosis not present

## 2017-01-11 DIAGNOSIS — R7989 Other specified abnormal findings of blood chemistry: Secondary | ICD-10-CM | POA: Diagnosis not present

## 2017-01-11 DIAGNOSIS — R7301 Impaired fasting glucose: Secondary | ICD-10-CM | POA: Diagnosis not present

## 2017-01-11 DIAGNOSIS — F411 Generalized anxiety disorder: Secondary | ICD-10-CM | POA: Diagnosis not present

## 2017-01-31 DIAGNOSIS — E038 Other specified hypothyroidism: Secondary | ICD-10-CM | POA: Diagnosis not present

## 2017-01-31 DIAGNOSIS — E119 Type 2 diabetes mellitus without complications: Secondary | ICD-10-CM | POA: Diagnosis not present

## 2017-01-31 DIAGNOSIS — E063 Autoimmune thyroiditis: Secondary | ICD-10-CM | POA: Diagnosis not present

## 2017-01-31 DIAGNOSIS — R7989 Other specified abnormal findings of blood chemistry: Secondary | ICD-10-CM | POA: Diagnosis not present

## 2017-02-01 DIAGNOSIS — K7689 Other specified diseases of liver: Secondary | ICD-10-CM | POA: Diagnosis not present

## 2017-02-01 DIAGNOSIS — D7589 Other specified diseases of blood and blood-forming organs: Secondary | ICD-10-CM | POA: Diagnosis not present

## 2017-02-01 LAB — TSH: TSH: 3.68 u[IU]/mL (ref 0.450–4.500)

## 2017-02-01 LAB — CMP14+EGFR
ALT: 54 IU/L — AB (ref 0–32)
AST: 34 IU/L (ref 0–40)
Albumin/Globulin Ratio: 2 (ref 1.2–2.2)
Albumin: 4.9 g/dL (ref 3.5–5.5)
Alkaline Phosphatase: 135 IU/L — ABNORMAL HIGH (ref 39–117)
BUN/Creatinine Ratio: 8 — ABNORMAL LOW (ref 9–23)
BUN: 7 mg/dL (ref 6–24)
Bilirubin Total: 0.4 mg/dL (ref 0.0–1.2)
CALCIUM: 10 mg/dL (ref 8.7–10.2)
CO2: 22 mmol/L (ref 18–29)
CREATININE: 0.84 mg/dL (ref 0.57–1.00)
Chloride: 95 mmol/L — ABNORMAL LOW (ref 96–106)
GFR calc Af Amer: 94 (ref >59)
GFR, EST NON AFRICAN AMERICAN: 81 (ref >59)
Globulin, Total: 2.5 (ref 1.5–4.5)
Glucose: 245 mg/dL — ABNORMAL HIGH (ref 65–99)
Potassium: 4.5 mmol/L (ref 3.5–5.2)
Sodium: 138 mmol/L (ref 134–144)
Total Protein: 7.4 g/dL (ref 6.0–8.5)

## 2017-02-01 LAB — HEMOGLOBIN A1C
Est. average glucose Bld gHb Est-mCnc: 160
Hgb A1c MFr Bld: 7.2 % — ABNORMAL HIGH (ref 4.8–5.6)

## 2017-02-01 LAB — T4, FREE: FREE T4: 1.69 ng/dL (ref 0.82–1.77)

## 2017-02-08 ENCOUNTER — Ambulatory Visit (INDEPENDENT_AMBULATORY_CARE_PROVIDER_SITE_OTHER): Payer: Medicare Other | Admitting: "Endocrinology

## 2017-02-08 ENCOUNTER — Encounter: Payer: Self-pay | Admitting: "Endocrinology

## 2017-02-08 VITALS — Ht 67.0 in | Wt 166.0 lb

## 2017-02-08 DIAGNOSIS — E119 Type 2 diabetes mellitus without complications: Secondary | ICD-10-CM | POA: Diagnosis not present

## 2017-02-08 DIAGNOSIS — E8881 Metabolic syndrome: Secondary | ICD-10-CM | POA: Diagnosis not present

## 2017-02-08 DIAGNOSIS — E063 Autoimmune thyroiditis: Secondary | ICD-10-CM | POA: Diagnosis not present

## 2017-02-08 DIAGNOSIS — F411 Generalized anxiety disorder: Secondary | ICD-10-CM | POA: Diagnosis not present

## 2017-02-08 DIAGNOSIS — E782 Mixed hyperlipidemia: Secondary | ICD-10-CM | POA: Diagnosis not present

## 2017-02-08 DIAGNOSIS — E038 Other specified hypothyroidism: Secondary | ICD-10-CM

## 2017-02-08 DIAGNOSIS — K7689 Other specified diseases of liver: Secondary | ICD-10-CM | POA: Diagnosis not present
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
## 2017-02-13 ENCOUNTER — Telehealth: Payer: Self-pay | Admitting: Internal Medicine

## 2017-02-13 NOTE — Telephone Encounter (Signed)
RECALL FOR ULTRASOUND WITH ELASTO AND LFT'S

## 2017-02-13 NOTE — Telephone Encounter (Signed)
Letter mailed for ultrasound. Printed phone note and placed in lab recall box for LFTs.

## 2017-02-21 ENCOUNTER — Other Ambulatory Visit: Payer: Self-pay

## 2017-02-21 DIAGNOSIS — K760 Fatty (change of) liver, not elsewhere classified: Secondary | ICD-10-CM

## 2017-03-01 ENCOUNTER — Ambulatory Visit (HOSPITAL_COMMUNITY): Admission: RE | Admit: 2017-03-01 | Payer: Medicare Other | Source: Ambulatory Visit

## 2017-03-05 ENCOUNTER — Other Ambulatory Visit: Payer: Self-pay

## 2017-03-05 ENCOUNTER — Other Ambulatory Visit: Payer: Self-pay | Admitting: Gastroenterology

## 2017-03-05 DIAGNOSIS — G8929 Other chronic pain: Secondary | ICD-10-CM | POA: Diagnosis not present

## 2017-03-05 DIAGNOSIS — Z79891 Long term (current) use of opiate analgesic: Secondary | ICD-10-CM | POA: Diagnosis not present

## 2017-03-05 DIAGNOSIS — G894 Chronic pain syndrome: Secondary | ICD-10-CM | POA: Diagnosis not present

## 2017-03-05 DIAGNOSIS — R160 Hepatomegaly, not elsewhere classified: Secondary | ICD-10-CM

## 2017-03-05 DIAGNOSIS — M542 Cervicalgia: Secondary | ICD-10-CM | POA: Diagnosis not present

## 2017-03-05 DIAGNOSIS — M47812 Spondylosis without myelopathy or radiculopathy, cervical region: Secondary | ICD-10-CM | POA: Diagnosis not present

## 2017-03-08 DIAGNOSIS — M542 Cervicalgia: Secondary | ICD-10-CM | POA: Diagnosis not present

## 2017-03-08 DIAGNOSIS — M5412 Radiculopathy, cervical region: Secondary | ICD-10-CM | POA: Diagnosis not present

## 2017-03-15 DIAGNOSIS — G894 Chronic pain syndrome: Secondary | ICD-10-CM | POA: Diagnosis not present

## 2017-03-15 DIAGNOSIS — G8929 Other chronic pain: Secondary | ICD-10-CM | POA: Diagnosis not present

## 2017-03-15 DIAGNOSIS — M47812 Spondylosis without myelopathy or radiculopathy, cervical region: Secondary | ICD-10-CM | POA: Diagnosis not present

## 2017-03-15 DIAGNOSIS — M542 Cervicalgia: Secondary | ICD-10-CM | POA: Diagnosis not present

## 2017-03-28 DIAGNOSIS — E119 Type 2 diabetes mellitus without complications: Secondary | ICD-10-CM | POA: Diagnosis not present

## 2017-04-09 DIAGNOSIS — R160 Hepatomegaly, not elsewhere classified: Secondary | ICD-10-CM | POA: Diagnosis not present

## 2017-04-10 LAB — HEPATIC FUNCTION PANEL
ALBUMIN: 4.7 g/dL (ref 3.5–5.5)
ALT: 33 IU/L — ABNORMAL HIGH (ref 0–32)
AST: 29 IU/L (ref 0–40)
Alkaline Phosphatase: 142 IU/L — ABNORMAL HIGH (ref 39–117)
BILIRUBIN TOTAL: 0.3 mg/dL (ref 0.0–1.2)
BILIRUBIN, DIRECT: 0.12 mg/dL (ref 0.00–0.40)
TOTAL PROTEIN: 7 g/dL (ref 6.0–8.5)

## 2017-04-16 NOTE — Progress Notes (Signed)
Alk Phos remains elevated. ALT very marginally elevated. I recommend office visit to discuss further serologies. Likely related to fatty liver.

## 2017-04-17 ENCOUNTER — Encounter: Payer: Self-pay | Admitting: Internal Medicine

## 2017-04-17 NOTE — Progress Notes (Signed)
APPOINTMENT MADE AND LETTER SENT °

## 2017-04-18 ENCOUNTER — Telehealth: Payer: Self-pay

## 2017-04-18 DIAGNOSIS — E119 Type 2 diabetes mellitus without complications: Secondary | ICD-10-CM | POA: Diagnosis not present

## 2017-04-18 DIAGNOSIS — G894 Chronic pain syndrome: Secondary | ICD-10-CM | POA: Diagnosis not present

## 2017-04-18 DIAGNOSIS — M542 Cervicalgia: Secondary | ICD-10-CM | POA: Diagnosis not present

## 2017-04-18 DIAGNOSIS — G8929 Other chronic pain: Secondary | ICD-10-CM | POA: Diagnosis not present

## 2017-04-18 NOTE — Telephone Encounter (Signed)
She will need to check ac and hs- 4 times a  Day and rtn with meter and logs.

## 2017-04-18 NOTE — Telephone Encounter (Signed)
Pt states that her BG has been high when she goes to her pain management physician. She states that she is supposed to have a "steroid injection" but they will not do it because her BG is high everytime they check it. Last time it was 294 and the time before that it was >400.    She is wanting to be seen. How many times a day would you like for her to test prior to an appt.

## 2017-04-19 ENCOUNTER — Other Ambulatory Visit: Payer: Self-pay | Admitting: "Endocrinology

## 2017-04-19 MED ORDER — BLOOD GLUCOSE MONITOR KIT: PACK | 0 refills | Status: DC

## 2017-04-19 NOTE — Telephone Encounter (Signed)
Pt notified. Glucometer Rx sent to pharmacy. She will bring meter/log to appt.

## 2017-04-30 ENCOUNTER — Encounter: Payer: Self-pay | Admitting: "Endocrinology

## 2017-04-30 ENCOUNTER — Ambulatory Visit (INDEPENDENT_AMBULATORY_CARE_PROVIDER_SITE_OTHER): Payer: Medicare Other | Admitting: "Endocrinology

## 2017-04-30 VITALS — BP 120/79 | HR 94 | Ht 67.0 in | Wt 165.0 lb

## 2017-04-30 DIAGNOSIS — E038 Other specified hypothyroidism: Secondary | ICD-10-CM | POA: Diagnosis not present

## 2017-04-30 DIAGNOSIS — E063 Autoimmune thyroiditis: Secondary | ICD-10-CM | POA: Diagnosis not present

## 2017-04-30 DIAGNOSIS — E782 Mixed hyperlipidemia: Secondary | ICD-10-CM

## 2017-04-30 DIAGNOSIS — E119 Type 2 diabetes mellitus without complications: Secondary | ICD-10-CM | POA: Diagnosis not present

## 2017-04-30 MED ORDER — INSULIN PEN NEEDLE 31G X 8 MM MISC: 1.0000 | 3 refills | Status: DC

## 2017-04-30 NOTE — Patient Instructions (Signed)
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
## 2017-05-14 ENCOUNTER — Encounter: Payer: Self-pay | Admitting: "Endocrinology

## 2017-05-14 ENCOUNTER — Ambulatory Visit (INDEPENDENT_AMBULATORY_CARE_PROVIDER_SITE_OTHER): Payer: Medicare Other | Admitting: "Endocrinology

## 2017-05-14 VITALS — BP 104/74 | HR 103 | Ht 67.0 in | Wt 161.0 lb

## 2017-05-14 DIAGNOSIS — E038 Other specified hypothyroidism: Secondary | ICD-10-CM | POA: Diagnosis not present

## 2017-05-14 DIAGNOSIS — E063 Autoimmune thyroiditis: Secondary | ICD-10-CM

## 2017-05-14 DIAGNOSIS — E782 Mixed hyperlipidemia: Secondary | ICD-10-CM | POA: Diagnosis not present

## 2017-05-14 DIAGNOSIS — E119 Type 2 diabetes mellitus without complications: Secondary | ICD-10-CM | POA: Diagnosis not present

## 2017-05-14 MED ORDER — INSULIN DEGLUDEC 200 UNIT/ML ~~LOC~~ SOPN: 26.0000 [IU] | PEN_INJECTOR | Freq: Every day | SUBCUTANEOUS | 2 refills | Status: DC

## 2017-05-14 NOTE — Patient Instructions (Signed)
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
## 2017-05-16 ENCOUNTER — Encounter: Payer: Self-pay | Admitting: Gastroenterology

## 2017-05-16 ENCOUNTER — Other Ambulatory Visit: Payer: Self-pay

## 2017-05-16 ENCOUNTER — Other Ambulatory Visit: Payer: Self-pay | Admitting: Gastroenterology

## 2017-05-16 ENCOUNTER — Ambulatory Visit (INDEPENDENT_AMBULATORY_CARE_PROVIDER_SITE_OTHER): Payer: Medicare Other | Admitting: Gastroenterology

## 2017-05-16 VITALS — BP 99/71 | HR 96 | Temp 97.7°F | Ht 67.0 in | Wt 161.6 lb

## 2017-05-16 DIAGNOSIS — R7989 Other specified abnormal findings of blood chemistry: Secondary | ICD-10-CM | POA: Diagnosis not present

## 2017-05-16 DIAGNOSIS — K219 Gastro-esophageal reflux disease without esophagitis: Secondary | ICD-10-CM | POA: Diagnosis not present

## 2017-05-16 DIAGNOSIS — K625 Hemorrhage of anus and rectum: Secondary | ICD-10-CM | POA: Diagnosis not present

## 2017-05-16 DIAGNOSIS — R945 Abnormal results of liver function studies: Secondary | ICD-10-CM

## 2017-05-16 DIAGNOSIS — Z800 Family history of malignant neoplasm of digestive organs: Secondary | ICD-10-CM

## 2017-05-16 MED ORDER — PROMETHAZINE HCL 25 MG PO TABS: 25.0000 mg | ORAL_TABLET | Freq: Four times a day (QID) | ORAL | 3 refills | Status: DC | PRN

## 2017-05-16 MED ORDER — PEG 3350-KCL-NA BICARB-NACL 420 G PO SOLR: 4000.0000 mL | ORAL | 0 refills | Status: DC

## 2017-05-16 NOTE — Patient Instructions (Signed)
Please complete the blood work.   We will get the ultrasound results from your doctor.   I have given you Dexilant samples to trial instead of Protonix. Take this once each morning in place of Protonix. Let me know how it does for you. I also refilled Phenergan for you.  We have scheduled you for a colonoscopy with Dr. Gala Romney in the near future.  I will see you in 3 months!
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
## 2017-05-17 ENCOUNTER — Telehealth: Payer: Self-pay

## 2017-05-17 LAB — ANA: ANA Titer 1: NEGATIVE

## 2017-05-17 LAB — HEPATITIS B SURFACE ANTIGEN: Hepatitis B Surface Ag: NEGATIVE

## 2017-05-17 LAB — ALPHA-1 ANTITRYPSIN PHENOTYPE: A-1 Antitrypsin: 132 mg/dL (ref 90–200)

## 2017-05-17 LAB — IGA: IgA/Immunoglobulin A, Serum: 98 mg/dL (ref 87–352)

## 2017-05-17 LAB — IRON AND TIBC
IRON SATURATION: 24 % (ref 15–55)
IRON: 69 ug/dL (ref 27–159)
TIBC: 285 ug/dL (ref 250–450)
UIBC: 216 ug/dL (ref 131–425)

## 2017-05-17 LAB — CERULOPLASMIN: CERULOPLASMIN: 33.5 mg/dL (ref 19.0–39.0)

## 2017-05-17 LAB — MITOCHONDRIAL ANTIBODIES: MITOCHONDRIAL AB: 5.9 U (ref 0.0–20.0)

## 2017-05-17 LAB — FERRITIN: Ferritin: 127 ng/mL (ref 15–150)

## 2017-05-17 LAB — HEPATITIS C ANTIBODY: Hep C Virus Ab: <0.1 s/co ratio (ref 0.0–0.9)

## 2017-05-17 LAB — TISSUE TRANSGLUTAMINASE, IGA

## 2017-05-17 LAB — ANTI-SMOOTH MUSCLE ANTIBODY, IGG: SMOOTH MUSCLE AB: 7 U (ref 0–19)

## 2017-05-17 NOTE — Telephone Encounter (Signed)
Received fax of [employer] and placed in box for Roseanne Kaufman, NP to review.

## 2017-05-18 ENCOUNTER — Encounter: Payer: Self-pay | Admitting: Gastroenterology

## 2017-05-18 NOTE — Assessment & Plan Note (Signed)
History of fatty liver, hepatomegaly. F2/F3 Metavir score. Mildly elevated alk phos. Will proceed with further serologies and obtain outside ultrasound. If she has any abnormalities on labs or inconclusive, would have a low threshold for liver biopsy to assess for NASH, advanced liver disease. Return in 3 months regardless.

## 2017-05-18 NOTE — Assessment & Plan Note (Signed)
51 year old female with several episodes of rectal bleeding likely benign anorectal source. Mother diagnosed with colon cancer in her early 42s. Patient with history of hyperplastic polyps and last colonoscopy in 2014, so she is on the 5-year-surveillance plan. Due to new onset rectal bleeding, proceed with colonoscopy now.  Proceed with TCS with Dr. Gala Romney in near future: the risks, benefits, and alternatives have been discussed with the patient in detail. The patient states understanding and desires to proceed. PROPOFOL due to polypharmacy

## 2017-05-18 NOTE — Assessment & Plan Note (Signed)
Chronic, with chronic nausea. Phenergan working best, so this is refilled. Stop Protonix and trial Dexilant samples. Patient to call with update regarding this.

## 2017-05-21 NOTE — Progress Notes (Signed)
CC'D TO PCP °

## 2017-05-21 NOTE — Telephone Encounter (Signed)
In system

## 2017-05-28 NOTE — Progress Notes (Signed)
Extensive serologies completed and all normal/negative (iron studies, alpha-1 antitrypsin, ANA, ASMA, AMA, ceruloplasmin, Hep B surface antigen, Hep C antibody, celiac serologies). US abdomen obtained from Feb 2018 outside facility that notes hepatomegaly with diffuse increased echogenicity with differentials to include hepatic steatosis vs hepatocellular disease. Patient had brought up the idea of liver biopsy at last appointment. Her last LFTs were not significantly elevated, but she does have hepatomegaly and thus far the evaluation is negative and likely due to fatty liver. Would not be unreasonable to consider an transjugular liver biopsy with hepatic vein wedge pressure measurements at some point in the future.

## 2017-06-11 NOTE — Patient Instructions (Signed)
Melanie Shepard  06/11/2017     @PREFPERIOPPHARMACY @   Your procedure is scheduled on  06/25/2017  Report to [facility] at  42  A.M.  Call this number if you have problems the morning of surgery:  [phone]   Remember:  Do not eat food or drink liquids after midnight.  Take these medicines the morning of surgery with A SIP OF WATER  Klonopin, levothyroxine, zofran or phenergan, protonix. Take 1/2 of your usual insulin dosage the night before your procedure. DO NOT take any medications for diabetes the morning of your procedure.   Do not wear jewelry, make-up or nail polish.  Do not wear lotions, powders, or perfumes, or deoderant.  Do not shave 48 hours prior to surgery.  Men may shave face and neck.  Do not bring valuables to the hospital.  [facility] is not responsible for any belongings or valuables.  Contacts, dentures or bridgework may not be worn into surgery.  Leave your suitcase in the car.  After surgery it may be brought to your room.  For patients admitted to the hospital, discharge time will be determined by your treatment team.  Patients discharged the day of surgery will not be allowed to drive home.   Name and phone number of your driver:   family Special instructions:  Follow thediet and prep instructions given to you by Dr Roseanne Kaufman office.  Please read over the following fact sheets that you were given. Anesthesia Post-op Instructions and Care and Recovery After Surgery       Colonoscopy, Adult A colonoscopy is an exam to look at the entire large intestine. During the exam, a lubricated, bendable tube is inserted into the anus and then passed into the rectum, colon, and other parts of the large intestine. A colonoscopy is often done as a part of normal colorectal screening or in response to certain symptoms, such as anemia, persistent diarrhea, abdominal pain, and blood in the stool. The exam can help screen for and diagnose medical problems,  including:  Tumors.  Polyps.  Inflammation.  Areas of bleeding.  Tell a health care provider about:  Any allergies you have.  All medicines you are taking, including vitamins, herbs, eye drops, creams, and over-the-counter medicines.  Any problems you or family members have had with anesthetic medicines.  Any blood disorders you have.  Any surgeries you have had.  Any medical conditions you have.  Any problems you have had passing stool. What are the risks? Generally, this is a safe procedure. However, problems may occur, including:  Bleeding.  A tear in the intestine.  A reaction to medicines given during the exam.  Infection (rare).  What happens before the procedure? Eating and drinking restrictions Follow instructions from your health care provider about eating and drinking, which may include:  A few days before the procedure - follow a low-fiber diet. Avoid nuts, seeds, dried fruit, raw fruits, and vegetables.  1-3 days before the procedure - follow a clear liquid diet. Drink only clear liquids, such as clear broth or bouillon, black coffee or tea, clear juice, clear soft drinks or sports drinks, gelatin dessert, and popsicles. Avoid any liquids that contain red or purple dye.  On the day of the procedure - do not eat or drink anything during the 2 hours before the procedure, or within the time period that your health care provider recommends.  Bowel prep If you were prescribed an  oral bowel prep to clean out your colon:  Take it as told by your health care provider. Starting the day before your procedure, you will need to drink a large amount of medicated liquid. The liquid will cause you to have multiple loose stools until your stool is almost clear or light green.  If your skin or anus gets irritated from diarrhea, you may use these to relieve the irritation: ? Medicated wipes, such as adult wet wipes with aloe and vitamin E. ? A skin soothing-product like  petroleum jelly.  If you vomit while drinking the bowel prep, take a break for up to 60 minutes and then begin the bowel prep again. If vomiting continues and you cannot take the bowel prep without vomiting, call your health care provider.  General instructions  Ask your health care provider about changing or stopping your regular medicines. This is especially important if you are taking diabetes medicines or blood thinners.  Plan to have someone take you home from the hospital or clinic. What happens during the procedure?  An IV tube may be inserted into one of your veins.  You will be given medicine to help you relax (sedative).  To reduce your risk of infection: ? Your health care team will wash or sanitize their hands. ? Your anal area will be washed with soap.  You will be asked to lie on your side with your knees bent.  Your health care provider will lubricate a long, thin, flexible tube. The tube will have a camera and a light on the end.  The tube will be inserted into your anus.  The tube will be gently eased through your rectum and colon.  Air will be delivered into your colon to keep it open. You may feel some pressure or cramping.  The camera will be used to take images during the procedure.  A small tissue sample may be removed from your body to be examined under a microscope (biopsy). If any potential problems are found, the tissue will be sent to a lab for testing.  If small polyps are found, your health care provider may remove them and have them checked for cancer cells.  The tube that was inserted into your anus will be slowly removed. The procedure may vary among health care providers and hospitals. What happens after the procedure?  Your blood pressure, heart rate, breathing rate, and blood oxygen level will be monitored until the medicines you were given have worn off.  Do not drive for 24 hours after the exam.  You may have a small amount of blood in  your stool.  You may pass gas and have mild abdominal cramping or bloating due to the air that was used to inflate your colon during the exam.  It is up to you to get the results of your procedure. Ask your health care provider, or the department performing the procedure, when your results will be ready. This information is not intended to replace advice given to you by your health care provider. Make sure you discuss any questions you have with your health care provider. Document Released: 11/24/2000 Document Revised: 09/27/2016 Document Reviewed: 02/08/2016 Elsevier Interactive Patient Education  2018 [employer].  Colonoscopy, Adult, Care After This sheet gives you information about how to care for yourself after your procedure. Your health care provider may also give you more specific instructions. If you have problems or questions, contact your health care provider. What can I expect after the procedure?  After the procedure, it is common to have:  A small amount of blood in your stool for 24 hours after the procedure.  Some gas.  Mild abdominal cramping or bloating.  Follow these instructions at home: General instructions   For the first 24 hours after the procedure: ? Do not drive or use machinery. ? Do not sign important documents. ? Do not drink alcohol. ? Do your regular daily activities at a slower pace than normal. ? Eat soft, easy-to-digest foods. ? Rest often.  Take over-the-counter or prescription medicines only as told by your health care provider.  It is up to you to get the results of your procedure. Ask your health care provider, or the department performing the procedure, when your results will be ready. Relieving cramping and bloating  Try walking around when you have cramps or feel bloated.  Apply heat to your abdomen as told by your health care provider. Use a heat source that your health care provider recommends, such as a moist heat pack or a heating  pad. ? Place a towel between your skin and the heat source. ? Leave the heat on for 20-30 minutes. ? Remove the heat if your skin turns bright red. This is especially important if you are unable to feel pain, heat, or cold. You may have a greater risk of getting burned. Eating and drinking  Drink enough fluid to keep your urine clear or pale yellow.  Resume your normal diet as instructed by your health care provider. Avoid heavy or fried foods that are hard to digest.  Avoid drinking alcohol for as long as instructed by your health care provider. Contact a health care provider if:  You have blood in your stool 2-3 days after the procedure. Get help right away if:  You have more than a small spotting of blood in your stool.  You pass large blood clots in your stool.  Your abdomen is swollen.  You have nausea or vomiting.  You have a fever.  You have increasing abdominal pain that is not relieved with medicine. This information is not intended to replace advice given to you by your health care provider. Make sure you discuss any questions you have with your health care provider. Document Released: 07/11/2004 Document Revised: 08/21/2016 Document Reviewed: 02/08/2016 Elsevier Interactive Patient Education  2018 [place] Anesthesia is a term that refers to techniques, procedures, and medicines that help a person stay safe and comfortable during a medical procedure. Monitored anesthesia care, or sedation, is one type of anesthesia. Your anesthesia specialist may recommend sedation if you will be having a procedure that does not require you to be unconscious, such as:  Cataract surgery.  A dental procedure.  A biopsy.  A colonoscopy.  During the procedure, you may receive a medicine to help you relax (sedative). There are three levels of sedation:  Mild sedation. At this level, you may feel awake and relaxed. You will be able to follow  directions.  Moderate sedation. At this level, you will be sleepy. You may not remember the procedure.  Deep sedation. At this level, you will be asleep. You will not remember the procedure.  The more medicine you are given, the deeper your level of sedation will be. Depending on how you respond to the procedure, the anesthesia specialist may change your level of sedation or the type of anesthesia to fit your needs. An anesthesia specialist will monitor you closely during the procedure. Let  your health care provider know about:  Any allergies you have.  All medicines you are taking, including vitamins, herbs, eye drops, creams, and over-the-counter medicines.  Any use of steroids (by mouth or as a cream).  Any problems you or family members have had with sedatives and anesthetic medicines.  Any blood disorders you have.  Any surgeries you have had.  Any medical conditions you have, such as sleep apnea.  Whether you are pregnant or may be pregnant.  Any use of cigarettes, alcohol, or street drugs. What are the risks? Generally, this is a safe procedure. However, problems may occur, including:  Getting too much medicine (oversedation).  Nausea.  Allergic reaction to medicines.  Trouble breathing. If this happens, a breathing tube may be used to help with breathing. It will be removed when you are awake and breathing on your own.  Heart trouble.  Lung trouble.  Before the procedure Staying hydrated Follow instructions from your health care provider about hydration, which may include:  Up to 2 hours before the procedure - you may continue to drink clear liquids, such as water, clear fruit juice, black coffee, and plain tea.  Eating and drinking restrictions Follow instructions from your health care provider about eating and drinking, which may include:  8 hours before the procedure - stop eating heavy meals or foods such as meat, fried foods, or fatty foods.  6 hours  before the procedure - stop eating light meals or foods, such as toast or cereal.  6 hours before the procedure - stop drinking milk or drinks that contain milk.  2 hours before the procedure - stop drinking clear liquids.  Medicines Ask your health care provider about:  Changing or stopping your regular medicines. This is especially important if you are taking diabetes medicines or blood thinners.  Taking medicines such as aspirin and ibuprofen. These medicines can thin your blood. Do not take these medicines before your procedure if your health care provider instructs you not to.  Tests and exams  You will have a physical exam.  You may have blood tests done to show: ? How well your kidneys and liver are working. ? How well your blood can clot.  General instructions  Plan to have someone take you home from the hospital or clinic.  If you will be going home right after the procedure, plan to have someone with you for 24 hours.  What happens during the procedure?  Your blood pressure, heart rate, breathing, level of pain and overall condition will be monitored.  An IV tube will be inserted into one of your veins.  Your anesthesia specialist will give you medicines as needed to keep you comfortable during the procedure. This may mean changing the level of sedation.  The procedure will be performed. After the procedure  Your blood pressure, heart rate, breathing rate, and blood oxygen level will be monitored until the medicines you were given have worn off.  Do not drive for 24 hours if you received a sedative.  You may: ? Feel sleepy, clumsy, or nauseous. ? Feel forgetful about what happened after the procedure. ? Have a sore throat if you had a breathing tube during the procedure. ? Vomit. This information is not intended to replace advice given to you by your health care provider. Make sure you discuss any questions you have with your health care provider. Document  Released: 08/23/2005 Document Revised: 05/05/2016 Document Reviewed: 03/19/2016 Elsevier Interactive Patient Education  [employer].  Monitored Anesthesia Care, Care After These instructions provide you with information about caring for yourself after your procedure. Your health care provider may also give you more specific instructions. Your treatment has been planned according to current medical practices, but problems sometimes occur. Call your health care provider if you have any problems or questions after your procedure. What can I expect after the procedure? After your procedure, it is common to:  Feel sleepy for several hours.  Feel clumsy and have poor balance for several hours.  Feel forgetful about what happened after the procedure.  Have poor judgment for several hours.  Feel nauseous or vomit.  Have a sore throat if you had a breathing tube during the procedure.  Follow these instructions at home: For at least 24 hours after the procedure:   Do not: ? Participate in activities in which you could fall or become injured. ? Drive. ? Use heavy machinery. ? Drink alcohol. ? Take sleeping pills or medicines that cause drowsiness. ? Make important decisions or sign legal documents. ? Take care of children on your own.  Rest. Eating and drinking  Follow the diet that is recommended by your health care provider.  If you vomit, drink water, juice, or soup when you can drink without vomiting.  Make sure you have little or no nausea before eating solid foods. General instructions  Have a responsible adult stay with you until you are awake and alert.  Take over-the-counter and prescription medicines only as told by your health care provider.  If you smoke, do not smoke without supervision.  Keep all follow-up visits as told by your health care provider. This is important. Contact a health care provider if:  You keep feeling nauseous or you keep  vomiting.  You feel light-headed.  You develop a rash.  You have a fever. Get help right away if:  You have trouble breathing. This information is not intended to replace advice given to you by your health care provider. Make sure you discuss any questions you have with your health care provider. Document Released: 03/19/2016 Document Revised: 07/19/2016 Document Reviewed: 03/19/2016 Elsevier Interactive Patient Education  [employer].

## 2017-06-20 ENCOUNTER — Encounter (HOSPITAL_COMMUNITY): Payer: Self-pay

## 2017-06-20 ENCOUNTER — Encounter (HOSPITAL_COMMUNITY)
Admission: RE | Admit: 2017-06-20 | Discharge: 2017-06-20 | Disposition: A | Discharge status: Home / Self Care | Payer: Medicare Other | Source: Ambulatory Visit | Attending: Internal Medicine | Admitting: Internal Medicine

## 2017-06-21 ENCOUNTER — Telehealth: Payer: Self-pay

## 2017-06-21 NOTE — Telephone Encounter (Signed)
Endo scheduler called office and said that pt was a no show for her pre-op appt yesterday. TCS is scheduled for 06/25/17. Someone at the hospital had tried to call her and left message. I tried to call pt, no answer, LMOVM and gave her phone number for Endo Scheduler to reschedule pre-op. Informed her that procedure can't be done 06/25/17 if she doesn't go to pre-op appt.

## 2017-06-22 NOTE — Telephone Encounter (Signed)
Endo scheduler called office. TCS for 06/25/17 has been canceled d/t pt didn't show up for pre-op appt and hasn't called to reschedule it. Tried to call pt to inform her that procedure has been canceled, no answer, LMOVM and informed her.  Routing to AB as FYI.

## 2017-06-23 DIAGNOSIS — Z800 Family history of malignant neoplasm of digestive organs: Secondary | ICD-10-CM | POA: Diagnosis not present

## 2017-06-23 DIAGNOSIS — E119 Type 2 diabetes mellitus without complications: Secondary | ICD-10-CM | POA: Diagnosis not present

## 2017-06-23 DIAGNOSIS — Z8249 Family history of ischemic heart disease and other diseases of the circulatory system: Secondary | ICD-10-CM | POA: Diagnosis not present

## 2017-06-23 DIAGNOSIS — I952 Hypotension due to drugs: Secondary | ICD-10-CM | POA: Diagnosis not present

## 2017-06-23 DIAGNOSIS — Z794 Long term (current) use of insulin: Secondary | ICD-10-CM | POA: Diagnosis not present

## 2017-06-23 DIAGNOSIS — R079 Chest pain, unspecified: Secondary | ICD-10-CM | POA: Diagnosis not present

## 2017-06-23 DIAGNOSIS — D72820 Lymphocytosis (symptomatic): Secondary | ICD-10-CM | POA: Diagnosis not present

## 2017-06-23 DIAGNOSIS — R200 Anesthesia of skin: Secondary | ICD-10-CM | POA: Diagnosis not present

## 2017-06-23 DIAGNOSIS — R9431 Abnormal electrocardiogram [ECG] [EKG]: Secondary | ICD-10-CM | POA: Diagnosis not present

## 2017-06-23 DIAGNOSIS — I248 Other forms of acute ischemic heart disease: Secondary | ICD-10-CM | POA: Diagnosis not present

## 2017-06-23 DIAGNOSIS — Z91041 Radiographic dye allergy status: Secondary | ICD-10-CM | POA: Diagnosis not present

## 2017-06-23 DIAGNOSIS — F17210 Nicotine dependence, cigarettes, uncomplicated: Secondary | ICD-10-CM | POA: Diagnosis not present

## 2017-06-23 DIAGNOSIS — R0789 Other chest pain: Secondary | ICD-10-CM | POA: Diagnosis not present

## 2017-06-23 DIAGNOSIS — Z833 Family history of diabetes mellitus: Secondary | ICD-10-CM | POA: Diagnosis not present

## 2017-06-23 DIAGNOSIS — E039 Hypothyroidism, unspecified: Secondary | ICD-10-CM | POA: Diagnosis not present

## 2017-06-23 DIAGNOSIS — Z87820 Personal history of traumatic brain injury: Secondary | ICD-10-CM | POA: Diagnosis not present

## 2017-06-23 DIAGNOSIS — F17200 Nicotine dependence, unspecified, uncomplicated: Secondary | ICD-10-CM | POA: Diagnosis not present

## 2017-06-25 ENCOUNTER — Ambulatory Visit (HOSPITAL_COMMUNITY): Admission: RE | Admit: 2017-06-25 | Payer: Medicare Other | Source: Ambulatory Visit | Admitting: Internal Medicine

## 2017-06-25 ENCOUNTER — Encounter (HOSPITAL_COMMUNITY): Admission: RE | Payer: Self-pay | Source: Ambulatory Visit

## 2017-06-25 SURGERY — COLONOSCOPY WITH PROPOFOL
Anesthesia: Monitor Anesthesia Care

## 2017-07-23 ENCOUNTER — Ambulatory Visit: Payer: Medicare Other | Admitting: "Endocrinology

## 2017-07-25 ENCOUNTER — Other Ambulatory Visit: Payer: Self-pay

## 2017-07-27 ENCOUNTER — Other Ambulatory Visit: Payer: Self-pay | Admitting: Gastroenterology

## 2017-07-27 MED ORDER — PANTOPRAZOLE SODIUM 40 MG PO TBEC: DELAYED_RELEASE_TABLET | ORAL | 5 refills | Status: DC

## 2017-08-09 DIAGNOSIS — R05 Cough: Secondary | ICD-10-CM | POA: Diagnosis not present

## 2017-08-09 DIAGNOSIS — F5104 Psychophysiologic insomnia: Secondary | ICD-10-CM | POA: Diagnosis not present

## 2017-08-09 DIAGNOSIS — F419 Anxiety disorder, unspecified: Secondary | ICD-10-CM | POA: Diagnosis not present

## 2017-08-14 ENCOUNTER — Ambulatory Visit: Payer: Medicare Other | Admitting: "Endocrinology

## 2017-08-16 ENCOUNTER — Ambulatory Visit: Payer: Medicare Other | Admitting: Gastroenterology

## 2017-08-16 ENCOUNTER — Encounter: Payer: Self-pay | Admitting: Gastroenterology

## 2017-08-16 ENCOUNTER — Telehealth: Payer: Self-pay | Admitting: Gastroenterology

## 2017-08-16 NOTE — Telephone Encounter (Signed)
PATIENT WAS A NO SHOW AND LETTER SENT  °

## 2017-08-21 DIAGNOSIS — E038 Other specified hypothyroidism: Secondary | ICD-10-CM | POA: Diagnosis not present

## 2017-08-21 DIAGNOSIS — E063 Autoimmune thyroiditis: Secondary | ICD-10-CM | POA: Diagnosis not present

## 2017-08-21 DIAGNOSIS — E119 Type 2 diabetes mellitus without complications: Secondary | ICD-10-CM | POA: Diagnosis not present

## 2017-08-22 LAB — RENAL FUNCTION PANEL
Albumin: 4.9 g/dL (ref 3.6–5.1)
BUN/Creatinine Ratio: 7 (calc) (ref 6–22)
BUN: 6 mg/dL — ABNORMAL LOW (ref 7–25)
CALCIUM: 10.4 mg/dL (ref 8.6–10.4)
CHLORIDE: 99 mmol/L (ref 98–110)
CO2: 25 mmol/L (ref 20–32)
CREATININE: 0.82 mg/dL (ref 0.50–1.05)
Glucose, Bld: 132 mg/dL (ref 65–139)
POTASSIUM: 4.2 mmol/L (ref 3.5–5.3)
Phosphorus: 4 mg/dL (ref 2.5–4.5)
Sodium: 138 mmol/L (ref 135–146)

## 2017-08-22 LAB — HEMOGLOBIN A1C
EAG (MMOL/L): 8.2 (calc)
Hgb A1c MFr Bld: 6.8 % of total Hgb — ABNORMAL HIGH (ref <5.7)
MEAN PLASMA GLUCOSE: 148 (calc)

## 2017-08-22 LAB — T4, FREE: Free T4: 1.1 ng/dL (ref 0.8–1.8)

## 2017-08-22 LAB — TSH: TSH: 33.29 mIU/L — ABNORMAL HIGH

## 2017-09-04 ENCOUNTER — Encounter: Payer: Self-pay | Admitting: "Endocrinology

## 2017-09-04 ENCOUNTER — Ambulatory Visit (INDEPENDENT_AMBULATORY_CARE_PROVIDER_SITE_OTHER): Payer: Medicare Other | Admitting: "Endocrinology

## 2017-09-04 VITALS — BP 110/72 | HR 101 | Ht 67.0 in | Wt 164.0 lb

## 2017-09-04 DIAGNOSIS — E063 Autoimmune thyroiditis: Secondary | ICD-10-CM | POA: Diagnosis not present

## 2017-09-04 DIAGNOSIS — E119 Type 2 diabetes mellitus without complications: Secondary | ICD-10-CM | POA: Diagnosis not present

## 2017-09-04 DIAGNOSIS — E782 Mixed hyperlipidemia: Secondary | ICD-10-CM

## 2017-09-04 DIAGNOSIS — E038 Other specified hypothyroidism: Secondary | ICD-10-CM | POA: Diagnosis not present
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
## 2017-10-17 ENCOUNTER — Other Ambulatory Visit: Payer: Self-pay | Admitting: "Endocrinology

## 2017-11-22 DIAGNOSIS — R7301 Impaired fasting glucose: Secondary | ICD-10-CM | POA: Diagnosis not present

## 2017-11-22 DIAGNOSIS — G4700 Insomnia, unspecified: Secondary | ICD-10-CM | POA: Diagnosis not present

## 2017-11-22 DIAGNOSIS — E063 Autoimmune thyroiditis: Secondary | ICD-10-CM | POA: Diagnosis not present

## 2017-11-22 DIAGNOSIS — F329 Major depressive disorder, single episode, unspecified: Secondary | ICD-10-CM | POA: Diagnosis not present

## 2017-11-22 DIAGNOSIS — E782 Mixed hyperlipidemia: Secondary | ICD-10-CM | POA: Diagnosis not present

## 2017-11-22 DIAGNOSIS — R7989 Other specified abnormal findings of blood chemistry: Secondary | ICD-10-CM | POA: Diagnosis not present

## 2017-11-22 DIAGNOSIS — Z1231 Encounter for screening mammogram for malignant neoplasm of breast: Secondary | ICD-10-CM | POA: Diagnosis not present

## 2017-11-22 DIAGNOSIS — F411 Generalized anxiety disorder: Secondary | ICD-10-CM | POA: Diagnosis not present

## 2017-11-24 DIAGNOSIS — Z9114 Patient's other noncompliance with medication regimen: Secondary | ICD-10-CM | POA: Diagnosis not present

## 2017-11-24 DIAGNOSIS — R7989 Other specified abnormal findings of blood chemistry: Secondary | ICD-10-CM | POA: Diagnosis not present

## 2017-11-24 DIAGNOSIS — E871 Hypo-osmolality and hyponatremia: Secondary | ICD-10-CM | POA: Diagnosis not present

## 2017-11-24 DIAGNOSIS — E1165 Type 2 diabetes mellitus with hyperglycemia: Secondary | ICD-10-CM | POA: Diagnosis not present

## 2017-11-24 DIAGNOSIS — I2109 ST elevation (STEMI) myocardial infarction involving other coronary artery of anterior wall: Secondary | ICD-10-CM | POA: Diagnosis not present

## 2017-11-24 DIAGNOSIS — I10 Essential (primary) hypertension: Secondary | ICD-10-CM | POA: Diagnosis not present

## 2017-11-24 DIAGNOSIS — R079 Chest pain, unspecified: Secondary | ICD-10-CM | POA: Diagnosis not present

## 2017-11-24 DIAGNOSIS — E119 Type 2 diabetes mellitus without complications: Secondary | ICD-10-CM | POA: Diagnosis not present

## 2017-11-24 DIAGNOSIS — Z5329 Procedure and treatment not carried out because of patient's decision for other reasons: Secondary | ICD-10-CM | POA: Diagnosis not present

## 2017-11-24 DIAGNOSIS — K219 Gastro-esophageal reflux disease without esophagitis: Secondary | ICD-10-CM | POA: Diagnosis not present

## 2017-11-24 DIAGNOSIS — E039 Hypothyroidism, unspecified: Secondary | ICD-10-CM | POA: Diagnosis not present

## 2017-11-24 DIAGNOSIS — T383X6A Underdosing of insulin and oral hypoglycemic [antidiabetic] drugs, initial encounter: Secondary | ICD-10-CM | POA: Diagnosis not present

## 2017-11-24 DIAGNOSIS — R6510 Systemic inflammatory response syndrome (SIRS) of non-infectious origin without acute organ dysfunction: Secondary | ICD-10-CM | POA: Diagnosis not present

## 2017-11-25 DIAGNOSIS — E871 Hypo-osmolality and hyponatremia: Secondary | ICD-10-CM | POA: Diagnosis not present

## 2017-11-25 DIAGNOSIS — R9431 Abnormal electrocardiogram [ECG] [EKG]: Secondary | ICD-10-CM | POA: Diagnosis not present

## 2017-11-25 DIAGNOSIS — R945 Abnormal results of liver function studies: Secondary | ICD-10-CM | POA: Diagnosis not present

## 2017-11-25 DIAGNOSIS — E119 Type 2 diabetes mellitus without complications: Secondary | ICD-10-CM | POA: Diagnosis not present

## 2017-12-06 ENCOUNTER — Other Ambulatory Visit: Payer: Self-pay | Admitting: "Endocrinology

## 2017-12-06 DIAGNOSIS — E119 Type 2 diabetes mellitus without complications: Secondary | ICD-10-CM | POA: Diagnosis not present

## 2017-12-06 DIAGNOSIS — E038 Other specified hypothyroidism: Secondary | ICD-10-CM | POA: Diagnosis not present

## 2017-12-07 LAB — COMPREHENSIVE METABOLIC PANEL
ALBUMIN: 5.1 g/dL (ref 3.5–5.5)
ALK PHOS: 130 IU/L — AB (ref 39–117)
ALT: 38 IU/L — ABNORMAL HIGH (ref 0–32)
AST: 35 IU/L (ref 0–40)
Albumin/Globulin Ratio: 1.9 (ref 1.2–2.2)
BUN / CREAT RATIO: 12 (ref 9–23)
BUN: 8 mg/dL (ref 6–24)
Bilirubin Total: 0.3 mg/dL (ref 0.0–1.2)
CO2: 24 mmol/L (ref 20–29)
CREATININE: 0.69 mg/dL (ref 0.57–1.00)
Calcium: 9.9 mg/dL (ref 8.7–10.2)
Chloride: 99 mmol/L (ref 96–106)
GFR calc non Af Amer: 101 mL/min/{1.73_m2} (ref >59)
GFR, EST AFRICAN AMERICAN: 117 mL/min/{1.73_m2} (ref >59)
Globulin, Total: 2.7 g/dL (ref 1.5–4.5)
Glucose: 182 mg/dL — ABNORMAL HIGH (ref 65–99)
Potassium: 3.9 mmol/L (ref 3.5–5.2)
Sodium: 139 mmol/L (ref 134–144)
TOTAL PROTEIN: 7.8 g/dL (ref 6.0–8.5)

## 2017-12-07 LAB — HGB A1C W/O EAG: HEMOGLOBIN A1C: 8.7 % — AB (ref 4.8–5.6)

## 2017-12-07 LAB — T4, FREE: Free T4: 1.73 ng/dL (ref 0.82–1.77)

## 2017-12-07 LAB — TSH: TSH: 2.19 u[IU]/mL (ref 0.450–4.500)

## 2017-12-07 LAB — SPECIMEN STATUS REPORT

## 2017-12-10 ENCOUNTER — Encounter: Payer: Self-pay | Admitting: "Endocrinology

## 2017-12-10 ENCOUNTER — Ambulatory Visit (INDEPENDENT_AMBULATORY_CARE_PROVIDER_SITE_OTHER): Payer: Medicare Other | Admitting: "Endocrinology

## 2017-12-10 VITALS — BP 104/69 | HR 98 | Ht 67.0 in | Wt 163.0 lb

## 2017-12-10 DIAGNOSIS — E038 Other specified hypothyroidism: Secondary | ICD-10-CM | POA: Diagnosis not present

## 2017-12-10 DIAGNOSIS — E063 Autoimmune thyroiditis: Secondary | ICD-10-CM

## 2017-12-10 DIAGNOSIS — E119 Type 2 diabetes mellitus without complications: Secondary | ICD-10-CM

## 2017-12-10 DIAGNOSIS — E782 Mixed hyperlipidemia: Secondary | ICD-10-CM | POA: Diagnosis not present

## 2017-12-10 MED ORDER — INSULIN DEGLUDEC 200 UNIT/ML ~~LOC~~ SOPN: 30.0000 [IU] | PEN_INJECTOR | Freq: Every day | SUBCUTANEOUS | 2 refills | Status: DC
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
## 2018-01-16 ENCOUNTER — Other Ambulatory Visit: Payer: Self-pay | Admitting: Internal Medicine

## 2018-01-16 DIAGNOSIS — R921 Mammographic calcification found on diagnostic imaging of breast: Secondary | ICD-10-CM

## 2018-01-22 ENCOUNTER — Ambulatory Visit
Admission: RE | Admit: 2018-01-22 | Discharge: 2018-01-22 | Disposition: A | Discharge status: Home / Self Care | Payer: Self-pay | Source: Ambulatory Visit | Attending: Internal Medicine | Admitting: Internal Medicine

## 2018-01-22 ENCOUNTER — Ambulatory Visit
Admission: RE | Admit: 2018-01-22 | Discharge: 2018-01-22 | Disposition: A | Discharge status: Home / Self Care | Payer: Medicare Other | Source: Ambulatory Visit | Attending: Internal Medicine | Admitting: Internal Medicine

## 2018-01-22 ENCOUNTER — Ambulatory Visit
Admission: RE | Admit: 2018-01-22 | Discharge: 2018-01-22 | Disposition: A | Discharge status: Home / Self Care | Payer: Medicare HMO | Source: Ambulatory Visit | Attending: Internal Medicine | Admitting: Internal Medicine

## 2018-01-22 DIAGNOSIS — R921 Mammographic calcification found on diagnostic imaging of breast: Secondary | ICD-10-CM

## 2018-02-02 ENCOUNTER — Other Ambulatory Visit: Payer: Self-pay | Admitting: "Endocrinology

## 2018-03-21 ENCOUNTER — Ambulatory Visit: Payer: Medicare HMO | Admitting: "Endocrinology

## 2018-03-21 ENCOUNTER — Encounter: Payer: Self-pay | Admitting: "Endocrinology

## 2018-07-08 ENCOUNTER — Other Ambulatory Visit: Payer: Self-pay | Admitting: "Endocrinology

## 2018-09-20 ENCOUNTER — Encounter: Payer: Self-pay | Admitting: Internal Medicine

## 2019-02-20 ENCOUNTER — Encounter: Payer: Self-pay | Admitting: Internal Medicine

## 2019-04-24 ENCOUNTER — Other Ambulatory Visit: Payer: Self-pay

## 2019-04-24 ENCOUNTER — Ambulatory Visit (INDEPENDENT_AMBULATORY_CARE_PROVIDER_SITE_OTHER): Payer: Medicare Other | Admitting: Gastroenterology

## 2019-04-24 ENCOUNTER — Encounter: Payer: Self-pay | Admitting: Gastroenterology

## 2019-04-24 DIAGNOSIS — R160 Hepatomegaly, not elsewhere classified: Secondary | ICD-10-CM

## 2019-04-24 DIAGNOSIS — R110 Nausea: Secondary | ICD-10-CM | POA: Diagnosis not present

## 2019-04-24 DIAGNOSIS — R945 Abnormal results of liver function studies: Secondary | ICD-10-CM

## 2019-04-24 DIAGNOSIS — Z800 Family history of malignant neoplasm of digestive organs: Secondary | ICD-10-CM | POA: Diagnosis not present

## 2019-04-24 DIAGNOSIS — R7989 Other specified abnormal findings of blood chemistry: Secondary | ICD-10-CM

## 2019-04-24 MED ORDER — PROMETHAZINE HCL 25 MG PO TABS: 25.0000 mg | ORAL_TABLET | Freq: Four times a day (QID) | ORAL | 0 refills | Status: DC | PRN

## 2019-04-24 NOTE — Patient Instructions (Addendum)
1. I will request copy of your recent admission and labs for further review. We will determine if any further work up is needed regarding your liver issues.  2. Colonoscopy as scheduled. See separate instructions.  3. Prescription for phenergan sent to your pharmacy to use sparingly for nausea.
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
## 2019-04-28 ENCOUNTER — Telehealth: Payer: Self-pay | Admitting: *Deleted

## 2019-04-28 NOTE — Telephone Encounter (Signed)
LMOVM to schedule TCS with propofol with RMR 

## 2019-04-29 NOTE — Telephone Encounter (Signed)
Letter mailed

## 2019-04-29 NOTE — Telephone Encounter (Signed)
LMOVM

## 2019-04-30 ENCOUNTER — Telehealth: Payer: Self-pay | Admitting: Gastroenterology

## 2019-04-30 DIAGNOSIS — D539 Nutritional anemia, unspecified: Secondary | ICD-10-CM

## 2019-04-30 NOTE — Telephone Encounter (Signed)
ED evaluation February 2020, presented with chest pain.  Seen at [facility] health in [place].  Labs showed white blood cell count 14,970, hemoglobin 12.4, MCV 121.4, platelets 221,000, BUN 8, creatinine 1.04, glucose 139, albumin 4.9, total bilirubin 0.6, AST 40, ALT 39, alkaline phosphatase 112.  Chest x-ray: Unremarkable.  CT chest/abdomen/pelvis without contrast: Fatty liver, hepatomegaly, mucosal edema of the right colon, hepatic flexure, transverse colon.  Remainder of colon normal.  No dilated loops of bowel.  No appendicitis.    Patient had additional labs with PCP 02/20/19 Ferritin 94.8, folate 5.7 normal, free T4 0.87 normal, iron 98, TIBC 305, iron saturation 32%, TSH 47.9  NEED TO REPEAT HER CBC AND B12 FOR MACROCYTIC ANEMIA. SCHEDULE TCS AS PLANNED OV IN 08/2019 FOR FATTY LIVER, HEPATOMEGALY

## 2019-04-30 NOTE — Telephone Encounter (Signed)
Lmom, waiting on a return call.  

## 2019-05-06 NOTE — Telephone Encounter (Signed)
Lmom, waiting on a return call.  

## 2019-05-07 ENCOUNTER — Other Ambulatory Visit: Payer: Self-pay

## 2019-05-07 DIAGNOSIS — D539 Nutritional anemia, unspecified: Secondary | ICD-10-CM

## 2019-05-07 NOTE — Telephone Encounter (Signed)
Please schedule ov 08/2019.

## 2019-05-07 NOTE — Telephone Encounter (Signed)
Lmom, mailing letter to pt.  

## 2019-05-08 ENCOUNTER — Encounter: Payer: Self-pay | Admitting: Internal Medicine

## 2019-05-08 NOTE — Telephone Encounter (Signed)
SCHEDULED AND LETTER SENT  °

## 2019-09-08 ENCOUNTER — Other Ambulatory Visit: Payer: Self-pay

## 2019-09-08 ENCOUNTER — Ambulatory Visit (INDEPENDENT_AMBULATORY_CARE_PROVIDER_SITE_OTHER): Payer: Medicare Other | Admitting: Gastroenterology

## 2019-09-08 ENCOUNTER — Encounter: Payer: Self-pay | Admitting: Gastroenterology

## 2019-09-08 VITALS — BP 99/68 | HR 85 | Temp 97.2°F | Ht 67.0 in | Wt 150.6 lb

## 2019-09-08 DIAGNOSIS — D649 Anemia, unspecified: Secondary | ICD-10-CM | POA: Insufficient documentation

## 2019-09-08 DIAGNOSIS — Z800 Family history of malignant neoplasm of digestive organs: Secondary | ICD-10-CM

## 2019-09-08 DIAGNOSIS — R110 Nausea: Secondary | ICD-10-CM

## 2019-09-08 DIAGNOSIS — R160 Hepatomegaly, not elsewhere classified: Secondary | ICD-10-CM

## 2019-09-08 DIAGNOSIS — K219 Gastro-esophageal reflux disease without esophagitis: Secondary | ICD-10-CM | POA: Diagnosis not present

## 2019-09-08 MED ORDER — PEG 3350-KCL-NA BICARB-NACL 420 G PO SOLR: 4000.0000 mL | ORAL | 0 refills | Status: DC

## 2019-09-08 MED ORDER — ONDANSETRON HCL 4 MG PO TABS: 4.0000 mg | ORAL_TABLET | Freq: Three times a day (TID) | ORAL | 0 refills | Status: DC | PRN
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
## 2019-09-08 NOTE — Patient Instructions (Signed)
1. Try to eat small snacks 5-6 times daily to maintain nutrition and help manage your nausea.  2. Eat bland, low fat foods. Avoid greasy foods.  3. Zofran for nausea up to three times daily as needed. 4. Colonoscopy as scheduled. See separate instructions.  5. I will review labs from your PCP and hospitalization. We will let you know if any additional labs are needed.

## 2019-10-14 ENCOUNTER — Encounter: Payer: Self-pay | Admitting: Internal Medicine

## 2019-10-14 ENCOUNTER — Telehealth: Payer: Self-pay | Admitting: Gastroenterology

## 2019-10-14 NOTE — Telephone Encounter (Signed)
lmom waiting on a return call.  

## 2019-10-14 NOTE — Telephone Encounter (Signed)
SCHEDULED AND LETTER SENT  °

## 2019-10-14 NOTE — Telephone Encounter (Signed)
Labs from August 2020: BUN 5, creatinine 0.81, albumin 3.9, total bilirubin 1, AST 23, ALT 37, alk phos 114, A1c 7.1, sed rate 68 hemoglobin 13.2, hematocrit 41.7, platelets 360,000, white blood cell count 12,270  Quality of report poor so not sent for scanning.  1# colonoscopy as planned.  2# make ov in six months for f/u hepatomegaly.

## 2019-10-15 NOTE — Telephone Encounter (Signed)
Pt was scheduled for ov and letter was mailed.

## 2019-10-23 ENCOUNTER — Telehealth: Payer: Self-pay | Admitting: Gastroenterology

## 2019-10-23 NOTE — Telephone Encounter (Signed)
Lmom, waiting on a return call.  

## 2019-10-23 NOTE — Telephone Encounter (Signed)
Please request patient to have CBC and B12 level drawn as previously requested. Was not done in 07/2019 by PCP.

## 2019-10-27 ENCOUNTER — Telehealth: Payer: Self-pay | Admitting: Internal Medicine

## 2019-10-27 NOTE — Telephone Encounter (Signed)
Spoke with pt. See other phone note.  

## 2019-10-27 NOTE — Telephone Encounter (Signed)
Pt's husband called saying that we have been calling a lot and wanted to know why we were calling. I told him because the nurse was trying to reach the patient and has also mailed a letter for her to contact the office. He said that she wanted him to call and find out what was going on. [phone]

## 2019-10-27 NOTE — Telephone Encounter (Signed)
Pt returned call and is going to have her labs done this Friday with her PCP. Pt is aware that a CBC, B12 needs to be drawn.

## 2019-11-04 NOTE — Patient Instructions (Signed)
Melanie Shepard  11/04/2019     @PREFPERIOPPHARMACY @   Your procedure is scheduled on  11/13/2019 .  Report to [facility] at  1145  A.M.  Call this number if you have problems the morning of surgery:  [phone]   Remember:  Follow the diet and prep instructions given to you by Dr Roseanne Kaufman office.                     Take these medicines the morning of surgery with A SIP OF WATER  Xanax(if needed), protonix, synthroid.    Do not wear jewelry, make-up or nail polish.  Do not wear lotions, powders, or perfumes. Please wear deodorant and brush your teeth.  Do not shave 48 hours prior to surgery.  Men may shave face and neck.  Do not bring valuables to the hospital.  [facility] is not responsible for any belongings or valuables.  Contacts, dentures or bridgework may not be worn into surgery.  Leave your suitcase in the car.  After surgery it may be brought to your room.  For patients admitted to the hospital, discharge time will be determined by your treatment team.  Patients discharged the day of surgery will not be allowed to drive home.   Name and phone number of your driver:   family Special instructions:  None  Please read over the following fact sheets that you were given. Anesthesia Post-op Instructions and Care and Recovery After Surgery       Colonoscopy, Adult, Care After This sheet gives you information about how to care for yourself after your procedure. Your health care provider may also give you more specific instructions. If you have problems or questions, contact your health care provider. What can I expect after the procedure? After the procedure, it is common to have:  A small amount of blood in your stool for 24 hours after the procedure.  Some gas.  Mild abdominal cramping or bloating. Follow these instructions at home: General instructions  For the first 24 hours after the procedure: ? Do not drive or use machinery. ? Do not sign  important documents. ? Do not drink alcohol. ? Do your regular daily activities at a slower pace than normal. ? Eat soft, easy-to-digest foods.  Take over-the-counter or prescription medicines only as told by your health care provider. Relieving cramping and bloating   Try walking around when you have cramps or feel bloated.  Apply heat to your abdomen as told by your health care provider. Use a heat source that your health care provider recommends, such as a moist heat pack or a heating pad. ? Place a towel between your skin and the heat source. ? Leave the heat on for 20-30 minutes. ? Remove the heat if your skin turns bright red. This is especially important if you are unable to feel pain, heat, or cold. You may have a greater risk of getting burned. Eating and drinking   Drink enough fluid to keep your urine pale yellow.  Resume your normal diet as instructed by your health care provider. Avoid heavy or fried foods that are hard to digest.  Avoid drinking alcohol for as long as instructed by your health care provider. Contact a health care provider if:  You have blood in your stool 2-3 days after the procedure. Get help right away if:  You have more than a small spotting of blood in your stool.  You pass  large blood clots in your stool.  Your abdomen is swollen.  You have nausea or vomiting.  You have a fever.  You have increasing abdominal pain that is not relieved with medicine. Summary  After the procedure, it is common to have a small amount of blood in your stool. You may also have mild abdominal cramping and bloating.  For the first 24 hours after the procedure, do not drive or use machinery, sign important documents, or drink alcohol.  Contact your health care provider if you have a lot of blood in your stool, nausea or vomiting, a fever, or increased abdominal pain. This information is not intended to replace advice given to you by your health care provider.  Make sure you discuss any questions you have with your health care provider. Document Released: 07/11/2004 Document Revised: 09/19/2017 Document Reviewed: 02/08/2016 Elsevier Patient Education  2020 [place] After These instructions provide you with information about caring for yourself after your procedure. Your health care provider may also give you more specific instructions. Your treatment has been planned according to current medical practices, but problems sometimes occur. Call your health care provider if you have any problems or questions after your procedure. What can I expect after the procedure? After your procedure, you may:  Feel sleepy for several hours.  Feel clumsy and have poor balance for several hours.  Feel forgetful about what happened after the procedure.  Have poor judgment for several hours.  Feel nauseous or vomit.  Have a sore throat if you had a breathing tube during the procedure. Follow these instructions at home: For at least 24 hours after the procedure:      Have a responsible adult stay with you. It is important to have someone help care for you until you are awake and alert.  Rest as needed.  Do not: ? Participate in activities in which you could fall or become injured. ? Drive. ? Use heavy machinery. ? Drink alcohol. ? Take sleeping pills or medicines that cause drowsiness. ? Make important decisions or sign legal documents. ? Take care of children on your own. Eating and drinking  Follow the diet that is recommended by your health care provider.  If you vomit, drink water, juice, or soup when you can drink without vomiting.  Make sure you have little or no nausea before eating solid foods. General instructions  Take over-the-counter and prescription medicines only as told by your health care provider.  If you have sleep apnea, surgery and certain medicines can increase your risk for breathing  problems. Follow instructions from your health care provider about wearing your sleep device: ? Anytime you are sleeping, including during daytime naps. ? While taking prescription pain medicines, sleeping medicines, or medicines that make you drowsy.  If you smoke, do not smoke without supervision.  Keep all follow-up visits as told by your health care provider. This is important. Contact a health care provider if:  You keep feeling nauseous or you keep vomiting.  You feel light-headed.  You develop a rash.  You have a fever. Get help right away if:  You have trouble breathing. Summary  For several hours after your procedure, you may feel sleepy and have poor judgment.  Have a responsible adult stay with you for at least 24 hours or until you are awake and alert. This information is not intended to replace advice given to you by your health care provider. Make sure you discuss any  questions you have with your health care provider. Document Released: 03/19/2016 Document Revised: 02/25/2018 Document Reviewed: 03/19/2016 Elsevier Patient Education  2020 [employer].

## 2019-11-11 ENCOUNTER — Other Ambulatory Visit (HOSPITAL_COMMUNITY)
Admission: RE | Admit: 2019-11-11 | Discharge: 2019-11-11 | Disposition: A | Discharge status: Home / Self Care | Payer: Medicare Other | Source: Ambulatory Visit | Attending: Internal Medicine | Admitting: Internal Medicine

## 2019-11-11 ENCOUNTER — Encounter (HOSPITAL_COMMUNITY)
Admission: RE | Admit: 2019-11-11 | Discharge: 2019-11-11 | Disposition: A | Discharge status: Home / Self Care | Payer: Medicare Other | Source: Ambulatory Visit | Attending: Internal Medicine | Admitting: Internal Medicine

## 2019-11-11 ENCOUNTER — Other Ambulatory Visit: Payer: Self-pay

## 2019-11-11 ENCOUNTER — Encounter (HOSPITAL_COMMUNITY): Payer: Self-pay

## 2019-11-11 DIAGNOSIS — D120 Benign neoplasm of cecum: Secondary | ICD-10-CM | POA: Insufficient documentation

## 2019-11-11 DIAGNOSIS — Z01812 Encounter for preprocedural laboratory examination: Secondary | ICD-10-CM | POA: Insufficient documentation

## 2019-11-11 DIAGNOSIS — Z800 Family history of malignant neoplasm of digestive organs: Secondary | ICD-10-CM | POA: Diagnosis not present

## 2019-11-11 DIAGNOSIS — D649 Anemia, unspecified: Secondary | ICD-10-CM | POA: Diagnosis not present

## 2019-11-11 LAB — CBC WITH DIFFERENTIAL/PLATELET
Abs Immature Granulocytes: 0.04 10*3/uL (ref 0.00–0.07)
Basophils Absolute: 0.1 10*3/uL (ref 0.0–0.1)
Basophils Relative: 1 %
Eosinophils Absolute: 0.4 10*3/uL (ref 0.0–0.5)
Eosinophils Relative: 4 %
HCT: 39 % (ref 36.0–46.0)
Hemoglobin: 12.6 g/dL (ref 12.0–15.0)
Immature Granulocytes: 0 %
Lymphocytes Relative: 39 %
Lymphs Abs: 4.2 10*3/uL — ABNORMAL HIGH (ref 0.7–4.0)
MCH: 34.1 pg — ABNORMAL HIGH (ref 26.0–34.0)
MCHC: 32.3 g/dL (ref 30.0–36.0)
MCV: 105.4 fL — ABNORMAL HIGH (ref 80.0–100.0)
Monocytes Absolute: 0.4 10*3/uL (ref 0.1–1.0)
Monocytes Relative: 4 %
Neutro Abs: 5.5 10*3/uL (ref 1.7–7.7)
Neutrophils Relative %: 52 %
Platelets: 267 10*3/uL (ref 150–400)
RBC: 3.7 MIL/uL — ABNORMAL LOW (ref 3.87–5.11)
RDW: 15.1 % (ref 11.5–15.5)
WBC: 10.6 10*3/uL — ABNORMAL HIGH (ref 4.0–10.5)
nRBC: 0 % (ref 0.0–0.2)

## 2019-11-11 LAB — BASIC METABOLIC PANEL
Anion gap: 10 (ref 5–15)
BUN: 6 mg/dL (ref 6–20)
CO2: 26 mmol/L (ref 22–32)
Calcium: 9.3 mg/dL (ref 8.9–10.3)
Chloride: 104 mmol/L (ref 98–111)
Creatinine, Ser: 0.75 mg/dL (ref 0.44–1.00)
GFR calc Af Amer: >60 mL/min (ref >60)
GFR calc non Af Amer: >60 mL/min (ref >60)
Glucose, Bld: 194 mg/dL — ABNORMAL HIGH (ref 70–99)
Potassium: 3.3 mmol/L — ABNORMAL LOW (ref 3.5–5.1)
Sodium: 140 mmol/L (ref 135–145)

## 2019-11-11 LAB — SARS CORONAVIRUS 2 (TAT 6-24 HRS): SARS Coronavirus 2: NEGATIVE

## 2019-11-13 ENCOUNTER — Other Ambulatory Visit: Payer: Self-pay

## 2019-11-13 ENCOUNTER — Ambulatory Visit (HOSPITAL_COMMUNITY)
Admission: RE | Admit: 2019-11-13 | Discharge: 2019-11-13 | Disposition: A | Discharge status: Home / Self Care | Payer: Medicare Other | Attending: Internal Medicine | Admitting: Internal Medicine

## 2019-11-13 ENCOUNTER — Telehealth: Payer: Self-pay

## 2019-11-13 ENCOUNTER — Encounter (HOSPITAL_COMMUNITY): Payer: Self-pay | Admitting: *Deleted

## 2019-11-13 ENCOUNTER — Ambulatory Visit (HOSPITAL_COMMUNITY): Payer: Medicare Other | Admitting: Anesthesiology

## 2019-11-13 ENCOUNTER — Encounter (HOSPITAL_COMMUNITY)
Admission: RE | Disposition: A | Discharge status: Home / Self Care | Payer: Self-pay | Source: Home / Self Care | Attending: Internal Medicine

## 2019-11-13 ENCOUNTER — Other Ambulatory Visit: Payer: Self-pay | Admitting: Gastroenterology

## 2019-11-13 DIAGNOSIS — F419 Anxiety disorder, unspecified: Secondary | ICD-10-CM | POA: Insufficient documentation

## 2019-11-13 DIAGNOSIS — Z79899 Other long term (current) drug therapy: Secondary | ICD-10-CM | POA: Insufficient documentation

## 2019-11-13 DIAGNOSIS — Z800 Family history of malignant neoplasm of digestive organs: Secondary | ICD-10-CM | POA: Diagnosis not present

## 2019-11-13 DIAGNOSIS — D649 Anemia, unspecified: Secondary | ICD-10-CM

## 2019-11-13 DIAGNOSIS — E063 Autoimmune thyroiditis: Secondary | ICD-10-CM | POA: Diagnosis not present

## 2019-11-13 DIAGNOSIS — K635 Polyp of colon: Secondary | ICD-10-CM

## 2019-11-13 DIAGNOSIS — J449 Chronic obstructive pulmonary disease, unspecified: Secondary | ICD-10-CM | POA: Diagnosis not present

## 2019-11-13 DIAGNOSIS — E119 Type 2 diabetes mellitus without complications: Secondary | ICD-10-CM | POA: Insufficient documentation

## 2019-11-13 DIAGNOSIS — E785 Hyperlipidemia, unspecified: Secondary | ICD-10-CM | POA: Insufficient documentation

## 2019-11-13 DIAGNOSIS — M1611 Unilateral primary osteoarthritis, right hip: Secondary | ICD-10-CM | POA: Diagnosis not present

## 2019-11-13 DIAGNOSIS — Z1211 Encounter for screening for malignant neoplasm of colon: Secondary | ICD-10-CM | POA: Diagnosis present

## 2019-11-13 DIAGNOSIS — Z79890 Hormone replacement therapy (postmenopausal): Secondary | ICD-10-CM | POA: Insufficient documentation

## 2019-11-13 DIAGNOSIS — F17210 Nicotine dependence, cigarettes, uncomplicated: Secondary | ICD-10-CM | POA: Insufficient documentation

## 2019-11-13 DIAGNOSIS — K219 Gastro-esophageal reflux disease without esophagitis: Secondary | ICD-10-CM | POA: Diagnosis not present

## 2019-11-13 HISTORY — PX: COLONOSCOPY WITH PROPOFOL: SHX5780

## 2019-11-13 HISTORY — PX: POLYPECTOMY: SHX5525

## 2019-11-13 SURGERY — COLONOSCOPY WITH PROPOFOL
Anesthesia: General

## 2019-11-13 MED ORDER — PROPOFOL 500 MG/50ML IV EMUL
INTRAVENOUS | Status: DC | PRN
  Administered 2019-11-13: 150 ug/kg/min via INTRAVENOUS

## 2019-11-13 MED ORDER — PROPOFOL 10 MG/ML IV BOLUS
INTRAVENOUS | Status: DC | PRN
  Administered 2019-11-13: 50 mg via INTRAVENOUS

## 2019-11-13 MED ORDER — CHLORHEXIDINE GLUCONATE CLOTH 2 % EX PADS: 6.0000 | MEDICATED_PAD | Freq: Once | CUTANEOUS | Status: DC

## 2019-11-13 MED ORDER — PROPOFOL 10 MG/ML IV BOLUS
INTRAVENOUS | Status: AC
  Filled 2019-11-13: qty 40

## 2019-11-13 MED ORDER — HYDROCODONE-ACETAMINOPHEN 7.5-325 MG PO TABS: 1.0000 | ORAL_TABLET | Freq: Once | ORAL | Status: DC | PRN

## 2019-11-13 MED ORDER — ONDANSETRON HCL 4 MG PO TABS: 4.0000 mg | ORAL_TABLET | Freq: Four times a day (QID) | ORAL | 0 refills | Status: DC | PRN

## 2019-11-13 MED ORDER — LACTATED RINGERS IV SOLN
INTRAVENOUS | Status: DC
  Administered 2019-11-13: 1000 mL via INTRAVENOUS

## 2019-11-13 MED ORDER — MIDAZOLAM HCL 2 MG/2ML IJ SOLN: 0.5000 mg | Freq: Once | INTRAMUSCULAR | Status: DC | PRN

## 2019-11-13 MED ORDER — HYDROMORPHONE HCL 1 MG/ML IJ SOLN: 0.2500 mg | INTRAMUSCULAR | Status: DC | PRN

## 2019-11-13 MED ORDER — PROMETHAZINE HCL 25 MG/ML IJ SOLN: 6.2500 mg | INTRAMUSCULAR | Status: DC | PRN

## 2019-11-13 NOTE — Telephone Encounter (Signed)
RMR aware that I have addressed. Melanie Shepard see result note. I have sent in RX to [facility]

## 2019-11-13 NOTE — Anesthesia Postprocedure Evaluation (Signed)
Anesthesia Post Note  Patient: Melanie Shepard  Procedure(s) Performed: COLONOSCOPY WITH PROPOFOL (N/A ) POLYPECTOMY  Patient location during evaluation: PACU Anesthesia Type: MAC Level of consciousness: awake, awake and alert and oriented Pain management: pain level controlled Vital Signs Assessment: post-procedure vital signs reviewed and stable Respiratory status: spontaneous breathing, respiratory function stable and nonlabored ventilation Cardiovascular status: stable Postop Assessment: no apparent nausea or vomiting Anesthetic complications: no     Last Vitals:  Vitals:   11/13/19 1153  BP: 104/76  Resp: 20  Temp: 37 C  SpO2: 94%    Last Pain:  Vitals:   11/13/19 1322  TempSrc:   PainSc: 4                  Sherrian Nunnelley

## 2019-11-13 NOTE — Telephone Encounter (Signed)
Pt's spouse called. Pt has made it home from her [facility] today and is very nauseated. They would like a nausea medication called into her pharmacy.

## 2019-11-13 NOTE — Transfer of Care (Signed)
Immediate Anesthesia Transfer of Care Note  Patient: Melanie Shepard  Procedure(s) Performed: COLONOSCOPY WITH PROPOFOL (N/A ) POLYPECTOMY  Patient Location: PACU  Anesthesia Type:MAC  Level of Consciousness: awake, alert , oriented and patient cooperative  Airway & Oxygen Therapy: Patient Spontanous Breathing and Patient connected to face mask oxygen  Post-op Assessment: Report given to RN and Post -op Vital signs reviewed and stable  Post vital signs: Reviewed and stable  Last Vitals:  Vitals Value Taken Time  BP    Temp    Pulse    Resp    SpO2      Last Pain:  Vitals:   11/13/19 1322  TempSrc:   PainSc: 4       Patients Stated Pain Goal: 8 (45/84/83 5075)  Complications: No apparent anesthesia complications
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
## 2019-11-13 NOTE — Telephone Encounter (Signed)
Already addressed

## 2019-11-13 NOTE — Addendum Note (Signed)
Addended by: Mahala Menghini on: 11/13/2019 04:47 PM   Modules accepted: Orders
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
## 2019-11-13 NOTE — Telephone Encounter (Signed)
Noted. pts spouse is aware.

## 2019-11-13 NOTE — Discharge Instructions (Signed)
Colonoscopy Discharge Instructions  Read the instructions outlined below and refer to this sheet in the next few weeks. These discharge instructions provide you with general information on caring for yourself after you leave the hospital. Your doctor may also give you specific instructions. While your treatment has been planned according to the most current medical practices available, unavoidable complications occasionally occur. If you have any problems or questions after discharge, call Dr. Gala Romney at [phone]. ACTIVITY  You may resume your regular activity, but move at a slower pace for the next 24 hours.   Take frequent rest periods for the next 24 hours.   Walking will help get rid of the air and reduce the bloated feeling in your belly (abdomen).   No driving for 24 hours (because of the medicine (anesthesia) used during the test).    Do not sign any important legal documents or operate any machinery for 24 hours (because of the anesthesia used during the test).  NUTRITION  Drink plenty of fluids.   You may resume your normal diet as instructed by your doctor.   Begin with a light meal and progress to your normal diet. Heavy or fried foods are harder to digest and may make you feel sick to your stomach (nauseated).   Avoid alcoholic beverages for 24 hours or as instructed.  MEDICATIONS  You may resume your normal medications unless your doctor tells you otherwise.  WHAT YOU CAN EXPECT TODAY  Some feelings of bloating in the abdomen.   Passage of more gas than usual.   Spotting of blood in your stool or on the toilet paper.  IF YOU HAD POLYPS REMOVED DURING THE COLONOSCOPY:  No aspirin products for 7 days or as instructed.   No alcohol for 7 days or as instructed.   Eat a soft diet for the next 24 hours.  FINDING OUT THE RESULTS OF YOUR TEST Not all test results are available during your visit. If your test results are not back during the visit, make an appointment  with your caregiver to find out the results. Do not assume everything is normal if you have not heard from your caregiver or the medical facility. It is important for you to follow up on all of your test results.  SEEK IMMEDIATE MEDICAL ATTENTION IF:  You have more than a spotting of blood in your stool.   Your belly is swollen (abdominal distention).   You are nauseated or vomiting.   You have a temperature over 101.   You have abdominal pain or discomfort that is severe or gets worse throughout the day.    Colon polyp information provided  Further recommendations to follow pending review of pathology report  At patient request, I called husband Eralyn Nagar [phone] and reviewed results.     Colon Polyps  Polyps are tissue growths inside the body. Polyps can grow in many places, including the large intestine (colon). A polyp may be a round bump or a mushroom-shaped growth. You could have one polyp or several. Most colon polyps are noncancerous (benign). However, some colon polyps can become cancerous over time. Finding and removing the polyps early can help prevent this. What are the causes? The exact cause of colon polyps is not known. What increases the risk? You are more likely to develop this condition if you:  Have a family history of colon cancer or colon polyps.  Are older than 6 or older than 45 if you are African American.  Have inflammatory  bowel disease, such as ulcerative colitis or Crohn's disease.  Have certain hereditary conditions, such as: ? Familial adenomatous polyposis. ? Lynch syndrome. ? Turcot syndrome. ? Peutz-Jeghers syndrome.  Are overweight.  Smoke cigarettes.  Do not get enough exercise.  Drink too much alcohol.  Eat a diet that is high in fat and red meat and low in fiber.  Had childhood cancer that was treated with abdominal radiation. What are the signs or symptoms? Most polyps do not cause symptoms. If you have symptoms,  they may include:  Blood coming from your rectum when having a bowel movement.  Blood in your stool. The stool may look dark red or black.  Abdominal pain.  A change in bowel habits, such as constipation or diarrhea. How is this diagnosed? This condition is diagnosed with a colonoscopy. This is a procedure in which a lighted, flexible scope is inserted into the anus and then passed into the colon to examine the area. Polyps are sometimes found when a colonoscopy is done as part of routine cancer screening tests. How is this treated? Treatment for this condition involves removing any polyps that are found. Most polyps can be removed during a colonoscopy. Those polyps will then be tested for cancer. Additional treatment may be needed depending on the results of testing. Follow these instructions at home: Lifestyle  Maintain a healthy weight, or lose weight if recommended by your health care provider.  Exercise every day or as told by your health care provider.  Do not use any products that contain nicotine or tobacco, such as cigarettes and e-cigarettes. If you need help quitting, ask your health care provider.  If you drink alcohol, limit how much you have: ? 0-1 drink a day for women. ? 0-2 drinks a day for men.  Be aware of how much alcohol is in your drink. In the [place] one drink equals one 12 oz bottle of beer (355 mL), one 5 oz glass of wine (148 mL), or one 1 oz shot of hard liquor (44 mL). Eating and drinking   Eat foods that are high in fiber, such as fruits, vegetables, and whole grains.  Eat foods that are high in calcium and vitamin D, such as milk, cheese, yogurt, eggs, liver, fish, and broccoli.  Limit foods that are high in fat, such as fried foods and desserts.  Limit the amount of red meat and processed meat you eat, such as hot dogs, sausage, bacon, and lunch meats. General instructions  Keep all follow-up visits as told by your health care provider. This is  important. ? This includes having regularly scheduled colonoscopies. ? Talk to your health care provider about when you need a colonoscopy. Contact a health care provider if:  You have new or worsening bleeding during a bowel movement.  You have new or increased blood in your stool.  You have a change in bowel habits.  You lose weight for no known reason. Summary  Polyps are tissue growths inside the body. Polyps can grow in many places, including the colon.  Most colon polyps are noncancerous (benign), but some can become cancerous over time.  This condition is diagnosed with a colonoscopy.  Treatment for this condition involves removing any polyps that are found. Most polyps can be removed during a colonoscopy. This information is not intended to replace advice given to you by your health care provider. Make sure you discuss any questions you have with your health care provider. Document Released: 08/23/2004 Document Revised:  03/14/2018 Document Reviewed: 03/14/2018 Elsevier Patient Education  [place] After These instructions provide you with information about caring for yourself after your procedure. Your health care provider may also give you more specific instructions. Your treatment has been planned according to current medical practices, but problems sometimes occur. Call your health care provider if you have any problems or questions after your procedure. What can I expect after the procedure? After your procedure, you may:  Feel sleepy for several hours.  Feel clumsy and have poor balance for several hours.  Feel forgetful about what happened after the procedure.  Have poor judgment for several hours.  Feel nauseous or vomit.  Have a sore throat if you had a breathing tube during the procedure. Follow these instructions at home: For at least 24 hours after the procedure:      Have a responsible adult stay with  you. It is important to have someone help care for you until you are awake and alert.  Rest as needed.  Do not: ? Participate in activities in which you could fall or become injured. ? Drive. ? Use heavy machinery. ? Drink alcohol. ? Take sleeping pills or medicines that cause drowsiness. ? Make important decisions or sign legal documents. ? Take care of children on your own. Eating and drinking  Follow the diet that is recommended by your health care provider.  If you vomit, drink water, juice, or soup when you can drink without vomiting.  Make sure you have little or no nausea before eating solid foods. General instructions  Take over-the-counter and prescription medicines only as told by your health care provider.  If you have sleep apnea, surgery and certain medicines can increase your risk for breathing problems. Follow instructions from your health care provider about wearing your sleep device: ? Anytime you are sleeping, including during daytime naps. ? While taking prescription pain medicines, sleeping medicines, or medicines that make you drowsy.  If you smoke, do not smoke without supervision.  Keep all follow-up visits as told by your health care provider. This is important. Contact a health care provider if:  You keep feeling nauseous or you keep vomiting.  You feel light-headed.  You develop a rash.  You have a fever. Get help right away if:  You have trouble breathing. Summary  For several hours after your procedure, you may feel sleepy and have poor judgment.  Have a responsible adult stay with you for at least 24 hours or until you are awake and alert. This information is not intended to replace advice given to you by your health care provider. Make sure you discuss any questions you have with your health care provider. Document Released: 03/19/2016 Document Revised: 02/25/2018 Document Reviewed: 03/19/2016 Elsevier Patient Education  2020 [employer].

## 2019-11-13 NOTE — Op Note (Signed)
[facility] Patient Name: Melanie Shepard Procedure Date: 11/13/2019 12:12 PM MRN: [id number] Date of Birth: 1966-07-15 Attending MD: Norvel Richards , MD CSN: [id number] Age: 53 Admit Type: Outpatient Procedure:                Colonoscopy Indications:              Screening for colorectal malignant neoplasm,                            Screening in patient at increased risk: Family                            history of 1st-degree relative with colorectal                            cancer before age 14 years Providers:                Norvel Richards, MD, Otis Peak B. Sharon Seller, RN,                            Aram Candela Referring MD:              Medicines:                Propofol per Anesthesia Complications:            No immediate complications. Estimated Blood Loss:     Estimated blood loss was minimal. Procedure:                Pre-Anesthesia Assessment:                           - Prior to the procedure, a History and Physical                            was performed, and patient medications and                            allergies were reviewed. The patient's tolerance of                            previous anesthesia was also reviewed. The risks                            and benefits of the procedure and the sedation                            options and risks were discussed with the patient.                            All questions were answered, and informed consent                            was obtained. Prior Anticoagulants: The patient has  taken no previous anticoagulant or antiplatelet                            agents. ASA Grade Assessment: II - A patient with                            mild systemic disease. After reviewing the risks                            and benefits, the patient was deemed in                            satisfactory condition to undergo the procedure.                           After obtaining informed  consent, the colonoscope                            was passed under direct vision. Throughout the                            procedure, the patient's blood pressure, pulse, and                            oxygen saturations were monitored continuously. The                            [id number]-[id number]) scope was introduced through                            the anus and advanced to the the cecum, identified                            by appendiceal orifice and ileocecal valve. The                            colonoscopy was performed without difficulty. The                            patient tolerated the procedure well. The quality                            of the bowel preparation was adequate. Scope In: 1:27:46 PM Scope Out: 1:46:06 PM Scope Withdrawal Time: 0 hours 12 minutes 37 seconds  Total Procedure Duration: 0 hours 18 minutes 20 seconds  Findings:      The perianal and digital rectal examinations were normal.      Four sessile polyps were found in the descending colon and cecum. The       polyps were 3 to 6 mm in size. These polyps were removed with a cold       snare. Resection and retrieval were complete. Estimated blood loss was       minimal.      The exam was otherwise without abnormality on direct and retroflexion  views. Impression:               - Four 3 to 6 mm polyps in the descending colon and                            in the cecum, removed with a cold snare. Resected                            and retrieved.                           - The examination was otherwise normal on direct                            and retroflexion views. Moderate Sedation:      Moderate (conscious) sedation was personally administered by an       anesthesia professional. The following parameters were monitored: oxygen       saturation, heart rate, blood pressure, respiratory rate, EKG, adequacy       of pulmonary ventilation, and response to care. Recommendation:           -  Discharge patient to home.                           - Advance diet as tolerated.                           - Continue present medications.                           - Repeat colonoscopy for surveillance based on                            pathology results.                           - Procedure Code(s):        --- Professional ---                           [phone], Colonoscopy, flexible; with removal of                            tumor(s), polyp(s), or other lesion(s) by snare                            technique Diagnosis Code(s):        --- Professional ---                           Z12.11, Encounter for screening for malignant                            neoplasm of colon                           Z80.0, Family history of malignant neoplasm of  digestive organs                           K63.5, Polyp of colon CPT copyright 2019 [employer]. All rights reserved. The codes documented in this report are preliminary and upon coder review may  be revised to meet current compliance requirements. Cristopher Estimable. Ollie Esty, MD Norvel Richards, MD 11/13/2019 1:59:38 PM This report has been signed electronically. Number of Addenda: 0

## 2019-11-14 LAB — SURGICAL PATHOLOGY

## 2019-11-16 ENCOUNTER — Encounter: Payer: Self-pay | Admitting: Internal Medicine

## 2019-11-17 ENCOUNTER — Encounter (HOSPITAL_COMMUNITY): Payer: Self-pay | Admitting: Internal Medicine

## 2020-03-15 ENCOUNTER — Other Ambulatory Visit: Payer: Self-pay | Admitting: Nurse Practitioner

## 2020-03-17 ENCOUNTER — Inpatient Hospital Stay (HOSPITAL_COMMUNITY)
Admission: EM | Admit: 2020-03-17 | Discharge: 2020-03-19 | DRG: 392 | Disposition: A | Discharge status: Home / Self Care | Payer: Medicare Other | Attending: Internal Medicine | Admitting: Internal Medicine

## 2020-03-17 ENCOUNTER — Emergency Department (HOSPITAL_COMMUNITY): Payer: Medicare Other

## 2020-03-17 ENCOUNTER — Encounter (HOSPITAL_COMMUNITY): Payer: Self-pay

## 2020-03-17 ENCOUNTER — Other Ambulatory Visit: Payer: Self-pay

## 2020-03-17 DIAGNOSIS — Z90710 Acquired absence of both cervix and uterus: Secondary | ICD-10-CM

## 2020-03-17 DIAGNOSIS — Z8249 Family history of ischemic heart disease and other diseases of the circulatory system: Secondary | ICD-10-CM

## 2020-03-17 DIAGNOSIS — Z8616 Personal history of COVID-19: Secondary | ICD-10-CM

## 2020-03-17 DIAGNOSIS — K219 Gastro-esophageal reflux disease without esophagitis: Secondary | ICD-10-CM | POA: Diagnosis present

## 2020-03-17 DIAGNOSIS — R1115 Cyclical vomiting syndrome unrelated to migraine: Secondary | ICD-10-CM | POA: Diagnosis present

## 2020-03-17 DIAGNOSIS — F419 Anxiety disorder, unspecified: Secondary | ICD-10-CM | POA: Diagnosis present

## 2020-03-17 DIAGNOSIS — Z20822 Contact with and (suspected) exposure to covid-19: Secondary | ICD-10-CM | POA: Diagnosis present

## 2020-03-17 DIAGNOSIS — Z86010 Personal history of colonic polyps: Secondary | ICD-10-CM

## 2020-03-17 DIAGNOSIS — R112 Nausea with vomiting, unspecified: Secondary | ICD-10-CM | POA: Diagnosis present

## 2020-03-17 DIAGNOSIS — R1013 Epigastric pain: Secondary | ICD-10-CM | POA: Diagnosis present

## 2020-03-17 DIAGNOSIS — E119 Type 2 diabetes mellitus without complications: Secondary | ICD-10-CM

## 2020-03-17 DIAGNOSIS — Z888 Allergy status to other drugs, medicaments and biological substances status: Secondary | ICD-10-CM

## 2020-03-17 DIAGNOSIS — F17210 Nicotine dependence, cigarettes, uncomplicated: Secondary | ICD-10-CM | POA: Diagnosis present

## 2020-03-17 DIAGNOSIS — F17200 Nicotine dependence, unspecified, uncomplicated: Secondary | ICD-10-CM | POA: Diagnosis present

## 2020-03-17 DIAGNOSIS — I2510 Atherosclerotic heart disease of native coronary artery without angina pectoris: Secondary | ICD-10-CM | POA: Diagnosis present

## 2020-03-17 DIAGNOSIS — I10 Essential (primary) hypertension: Secondary | ICD-10-CM | POA: Diagnosis present

## 2020-03-17 DIAGNOSIS — K529 Noninfective gastroenteritis and colitis, unspecified: Secondary | ICD-10-CM | POA: Diagnosis not present

## 2020-03-17 DIAGNOSIS — Z79890 Hormone replacement therapy (postmenopausal): Secondary | ICD-10-CM

## 2020-03-17 DIAGNOSIS — Z825 Family history of asthma and other chronic lower respiratory diseases: Secondary | ICD-10-CM

## 2020-03-17 DIAGNOSIS — Z79899 Other long term (current) drug therapy: Secondary | ICD-10-CM

## 2020-03-17 DIAGNOSIS — R933 Abnormal findings on diagnostic imaging of other parts of digestive tract: Secondary | ICD-10-CM

## 2020-03-17 DIAGNOSIS — E785 Hyperlipidemia, unspecified: Secondary | ICD-10-CM | POA: Diagnosis present

## 2020-03-17 DIAGNOSIS — Z800 Family history of malignant neoplasm of digestive organs: Secondary | ICD-10-CM

## 2020-03-17 DIAGNOSIS — Z9049 Acquired absence of other specified parts of digestive tract: Secondary | ICD-10-CM

## 2020-03-17 LAB — RAPID URINE DRUG SCREEN, HOSP PERFORMED
Amphetamines: NOT DETECTED
Barbiturates: NOT DETECTED
Benzodiazepines: POSITIVE — AB
Cocaine: NOT DETECTED
Opiates: POSITIVE — AB
Tetrahydrocannabinol: NOT DETECTED

## 2020-03-17 LAB — COMPREHENSIVE METABOLIC PANEL
ALT: 24 U/L (ref 0–44)
AST: 30 U/L (ref 15–41)
Albumin: 4.6 g/dL (ref 3.5–5.0)
Alkaline Phosphatase: 109 U/L (ref 38–126)
Anion gap: 12 (ref 5–15)
BUN: 7 mg/dL (ref 6–20)
CO2: 26 mmol/L (ref 22–32)
Calcium: 9.9 mg/dL (ref 8.9–10.3)
Chloride: 103 mmol/L (ref 98–111)
Creatinine, Ser: 0.73 mg/dL (ref 0.44–1.00)
GFR calc Af Amer: >60 mL/min (ref >60)
GFR calc non Af Amer: >60 mL/min (ref >60)
Glucose, Bld: 142 mg/dL — ABNORMAL HIGH (ref 70–99)
Potassium: 3.7 mmol/L (ref 3.5–5.1)
Sodium: 141 mmol/L (ref 135–145)
Total Bilirubin: 0.8 mg/dL (ref 0.3–1.2)
Total Protein: 7.9 g/dL (ref 6.5–8.1)

## 2020-03-17 LAB — CBC
HCT: 45.2 % (ref 36.0–46.0)
Hemoglobin: 14.6 g/dL (ref 12.0–15.0)
MCH: 32.2 pg (ref 26.0–34.0)
MCHC: 32.3 g/dL (ref 30.0–36.0)
MCV: 99.8 fL (ref 80.0–100.0)
Platelets: 307 10*3/uL (ref 150–400)
RBC: 4.53 MIL/uL (ref 3.87–5.11)
RDW: 14 % (ref 11.5–15.5)
WBC: 13.7 10*3/uL — ABNORMAL HIGH (ref 4.0–10.5)
nRBC: 0 % (ref 0.0–0.2)

## 2020-03-17 LAB — URINALYSIS, ROUTINE W REFLEX MICROSCOPIC
Bilirubin Urine: NEGATIVE
Glucose, UA: NEGATIVE mg/dL
Hgb urine dipstick: NEGATIVE
Ketones, ur: NEGATIVE mg/dL
Leukocytes,Ua: NEGATIVE
Nitrite: NEGATIVE
Protein, ur: 30 mg/dL — AB
Specific Gravity, Urine: 1.017 (ref 1.005–1.030)
pH: 7 (ref 5.0–8.0)

## 2020-03-17 LAB — LIPASE, BLOOD: Lipase: 23 U/L (ref 11–51)

## 2020-03-17 MED ORDER — LORAZEPAM 2 MG/ML IJ SOLN
1.0000 mg | Freq: Once | INTRAMUSCULAR | Status: AC
  Administered 2020-03-17: 17:00:00 1 mg via INTRAVENOUS
  Filled 2020-03-17: qty 1

## 2020-03-17 MED ORDER — MORPHINE SULFATE (PF) 4 MG/ML IV SOLN
4.0000 mg | Freq: Once | INTRAVENOUS | Status: AC
  Administered 2020-03-17: 23:00:00 4 mg via INTRAVENOUS
  Filled 2020-03-17: qty 1

## 2020-03-17 MED ORDER — METOCLOPRAMIDE HCL 5 MG/ML IJ SOLN
10.0000 mg | Freq: Once | INTRAMUSCULAR | Status: AC
  Administered 2020-03-17: 18:00:00 10 mg via INTRAVENOUS
  Filled 2020-03-17: qty 2

## 2020-03-17 MED ORDER — SODIUM CHLORIDE 0.9 % IV BOLUS
500.0000 mL | Freq: Once | INTRAVENOUS | Status: AC
  Administered 2020-03-17: 21:00:00 500 mL via INTRAVENOUS

## 2020-03-17 MED ORDER — FAMOTIDINE IN NACL 20-0.9 MG/50ML-% IV SOLN
20.0000 mg | Freq: Once | INTRAVENOUS | Status: AC
  Administered 2020-03-17: 16:00:00 20 mg via INTRAVENOUS
  Filled 2020-03-17: qty 50

## 2020-03-17 MED ORDER — PROMETHAZINE HCL 25 MG/ML IJ SOLN
12.5000 mg | Freq: Once | INTRAMUSCULAR | Status: AC
  Administered 2020-03-17: 21:00:00 12.5 mg via INTRAVENOUS
  Filled 2020-03-17: qty 1

## 2020-03-17 MED ORDER — SODIUM CHLORIDE 0.9 % IV BOLUS
1000.0000 mL | Freq: Once | INTRAVENOUS | Status: AC
  Administered 2020-03-17: 17:00:00 1000 mL via INTRAVENOUS

## 2020-03-17 MED ORDER — ONDANSETRON HCL 4 MG/2ML IJ SOLN
4.0000 mg | Freq: Once | INTRAMUSCULAR | Status: AC
  Administered 2020-03-17: 22:00:00 4 mg via INTRAVENOUS
  Filled 2020-03-17: qty 2

## 2020-03-17 MED ORDER — ONDANSETRON HCL 4 MG/2ML IJ SOLN
4.0000 mg | Freq: Once | INTRAMUSCULAR | Status: AC
  Administered 2020-03-17: 16:00:00 4 mg via INTRAVENOUS
  Filled 2020-03-17: qty 2

## 2020-03-17 MED ORDER — MORPHINE SULFATE (PF) 4 MG/ML IV SOLN
4.0000 mg | Freq: Once | INTRAVENOUS | Status: AC
  Administered 2020-03-17: 18:00:00 4 mg via INTRAVENOUS
  Filled 2020-03-17: qty 1

## 2020-03-17 MED ORDER — HYDROMORPHONE HCL 1 MG/ML IJ SOLN
0.5000 mg | Freq: Once | INTRAMUSCULAR | Status: AC
  Administered 2020-03-17: 16:00:00 0.5 mg via INTRAVENOUS
  Filled 2020-03-17: qty 1

## 2020-03-17 MED ORDER — SODIUM CHLORIDE 0.9 % IV BOLUS
1000.0000 mL | Freq: Once | INTRAVENOUS | Status: AC
  Administered 2020-03-17: 16:00:00 1000 mL via INTRAVENOUS

## 2020-03-17 NOTE — ED Notes (Signed)
Pt transported to CT Scan. Pt aware we still require a urine sample.
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
## 2020-03-17 NOTE — ED Triage Notes (Signed)
Pt to er, pt states that she had some nausea yesterday, states that she has been vomiting today, states that she called her pmd and was told to come to the er for possible dehydration.  Pt c/o headache and abd pain

## 2020-03-17 NOTE — ED Notes (Signed)
Pt now resting comfortably. Pt reports thinking she could be ready to go home.

## 2020-03-17 NOTE — ED Notes (Signed)
Pt was informed that we need a urine sample. 

## 2020-03-17 NOTE — ED Notes (Signed)
Pt offered ice chips and Sprite to drink. Pt reports feeling nauseated following sips of fluids with ice chips but has not had an episode of emesis.

## 2020-03-18 ENCOUNTER — Encounter (HOSPITAL_COMMUNITY): Payer: Self-pay | Admitting: Internal Medicine

## 2020-03-18 DIAGNOSIS — R933 Abnormal findings on diagnostic imaging of other parts of digestive tract: Secondary | ICD-10-CM | POA: Diagnosis not present

## 2020-03-18 DIAGNOSIS — Z86010 Personal history of colonic polyps: Secondary | ICD-10-CM | POA: Diagnosis not present

## 2020-03-18 DIAGNOSIS — R1013 Epigastric pain: Secondary | ICD-10-CM | POA: Diagnosis not present

## 2020-03-18 DIAGNOSIS — Z8249 Family history of ischemic heart disease and other diseases of the circulatory system: Secondary | ICD-10-CM | POA: Diagnosis not present

## 2020-03-18 DIAGNOSIS — R112 Nausea with vomiting, unspecified: Secondary | ICD-10-CM | POA: Diagnosis present

## 2020-03-18 DIAGNOSIS — Z79890 Hormone replacement therapy (postmenopausal): Secondary | ICD-10-CM | POA: Diagnosis not present

## 2020-03-18 DIAGNOSIS — Z888 Allergy status to other drugs, medicaments and biological substances status: Secondary | ICD-10-CM | POA: Diagnosis not present

## 2020-03-18 DIAGNOSIS — I10 Essential (primary) hypertension: Secondary | ICD-10-CM | POA: Diagnosis present

## 2020-03-18 DIAGNOSIS — F17210 Nicotine dependence, cigarettes, uncomplicated: Secondary | ICD-10-CM | POA: Diagnosis present

## 2020-03-18 DIAGNOSIS — K219 Gastro-esophageal reflux disease without esophagitis: Secondary | ICD-10-CM | POA: Diagnosis present

## 2020-03-18 DIAGNOSIS — Z90710 Acquired absence of both cervix and uterus: Secondary | ICD-10-CM | POA: Diagnosis not present

## 2020-03-18 DIAGNOSIS — F419 Anxiety disorder, unspecified: Secondary | ICD-10-CM | POA: Diagnosis present

## 2020-03-18 DIAGNOSIS — K529 Noninfective gastroenteritis and colitis, unspecified: Secondary | ICD-10-CM | POA: Diagnosis present

## 2020-03-18 DIAGNOSIS — Z9049 Acquired absence of other specified parts of digestive tract: Secondary | ICD-10-CM | POA: Diagnosis not present

## 2020-03-18 DIAGNOSIS — Z20822 Contact with and (suspected) exposure to covid-19: Secondary | ICD-10-CM | POA: Diagnosis present

## 2020-03-18 DIAGNOSIS — Z8616 Personal history of COVID-19: Secondary | ICD-10-CM | POA: Diagnosis not present

## 2020-03-18 DIAGNOSIS — I2510 Atherosclerotic heart disease of native coronary artery without angina pectoris: Secondary | ICD-10-CM | POA: Diagnosis present

## 2020-03-18 DIAGNOSIS — Z720 Tobacco use: Secondary | ICD-10-CM | POA: Insufficient documentation

## 2020-03-18 DIAGNOSIS — Z800 Family history of malignant neoplasm of digestive organs: Secondary | ICD-10-CM | POA: Diagnosis not present

## 2020-03-18 DIAGNOSIS — E785 Hyperlipidemia, unspecified: Secondary | ICD-10-CM | POA: Diagnosis present

## 2020-03-18 DIAGNOSIS — F17200 Nicotine dependence, unspecified, uncomplicated: Secondary | ICD-10-CM | POA: Insufficient documentation

## 2020-03-18 DIAGNOSIS — R1115 Cyclical vomiting syndrome unrelated to migraine: Secondary | ICD-10-CM | POA: Diagnosis present

## 2020-03-18 DIAGNOSIS — Z825 Family history of asthma and other chronic lower respiratory diseases: Secondary | ICD-10-CM | POA: Diagnosis not present

## 2020-03-18 DIAGNOSIS — Z79899 Other long term (current) drug therapy: Secondary | ICD-10-CM | POA: Diagnosis not present

## 2020-03-18 LAB — RETICULOCYTES
Immature Retic Fract: 10.1 % (ref 2.3–15.9)
RBC.: 3.6 MIL/uL — ABNORMAL LOW (ref 3.87–5.11)
Retic Count, Absolute: 44.6 10*3/uL (ref 19.0–186.0)
Retic Ct Pct: 1.2 % (ref 0.4–3.1)

## 2020-03-18 LAB — IRON AND TIBC
Iron: 121 ug/dL (ref 28–170)
Saturation Ratios: 44 % — ABNORMAL HIGH (ref 10.4–31.8)
TIBC: 277 ug/dL (ref 250–450)
UIBC: 156 ug/dL

## 2020-03-18 LAB — COMPREHENSIVE METABOLIC PANEL
ALT: 17 U/L (ref 0–44)
AST: 16 U/L (ref 15–41)
Albumin: 3.4 g/dL — ABNORMAL LOW (ref 3.5–5.0)
Alkaline Phosphatase: 82 U/L (ref 38–126)
Anion gap: 5 (ref 5–15)
BUN: 7 mg/dL (ref 6–20)
CO2: 27 mmol/L (ref 22–32)
Calcium: 8.8 mg/dL — ABNORMAL LOW (ref 8.9–10.3)
Chloride: 112 mmol/L — ABNORMAL HIGH (ref 98–111)
Creatinine, Ser: 0.61 mg/dL (ref 0.44–1.00)
GFR calc Af Amer: >60 mL/min (ref >60)
GFR calc non Af Amer: >60 mL/min (ref >60)
Glucose, Bld: 104 mg/dL — ABNORMAL HIGH (ref 70–99)
Potassium: 3.4 mmol/L — ABNORMAL LOW (ref 3.5–5.1)
Sodium: 144 mmol/L (ref 135–145)
Total Bilirubin: 0.7 mg/dL (ref 0.3–1.2)
Total Protein: 6 g/dL — ABNORMAL LOW (ref 6.5–8.1)

## 2020-03-18 LAB — TSH: TSH: 0.174 u[IU]/mL — ABNORMAL LOW (ref 0.350–4.500)

## 2020-03-18 LAB — GLUCOSE, CAPILLARY
Glucose-Capillary: 104 mg/dL — ABNORMAL HIGH (ref 70–99)
Glucose-Capillary: 88 mg/dL (ref 70–99)
Glucose-Capillary: 88 mg/dL (ref 70–99)
Glucose-Capillary: 84 mg/dL (ref 70–99)

## 2020-03-18 LAB — VITAMIN B12: Vitamin B-12: 244 pg/mL (ref 180–914)

## 2020-03-18 LAB — CBC
HCT: 35 % — ABNORMAL LOW (ref 36.0–46.0)
Hemoglobin: 11.1 g/dL — ABNORMAL LOW (ref 12.0–15.0)
MCH: 32.2 pg (ref 26.0–34.0)
MCHC: 31.7 g/dL (ref 30.0–36.0)
MCV: 101.4 fL — ABNORMAL HIGH (ref 80.0–100.0)
Platelets: 235 10*3/uL (ref 150–400)
RBC: 3.45 MIL/uL — ABNORMAL LOW (ref 3.87–5.11)
RDW: 14.1 % (ref 11.5–15.5)
WBC: 11.8 10*3/uL — ABNORMAL HIGH (ref 4.0–10.5)
nRBC: 0 % (ref 0.0–0.2)

## 2020-03-18 LAB — RESPIRATORY PANEL BY RT PCR (FLU A&B, COVID)
Influenza A by PCR: NEGATIVE
Influenza B by PCR: NEGATIVE
SARS Coronavirus 2 by RT PCR: NEGATIVE

## 2020-03-18 LAB — FERRITIN: Ferritin: 63 ng/mL (ref 11–307)

## 2020-03-18 LAB — HIV ANTIBODY (ROUTINE TESTING W REFLEX): HIV Screen 4th Generation wRfx: NONREACTIVE

## 2020-03-18 LAB — FOLATE: Folate: 7.5 ng/mL (ref >5.9)

## 2020-03-18 MED ORDER — LACTATED RINGERS IV SOLN: INTRAVENOUS | Status: DC

## 2020-03-18 MED ORDER — LEVOTHYROXINE SODIUM 75 MCG PO TABS
150.0000 ug | ORAL_TABLET | Freq: Every morning | ORAL | Status: DC
  Administered 2020-03-19: 150 ug via ORAL
  Filled 2020-03-18: qty 2

## 2020-03-18 MED ORDER — PROMETHAZINE HCL 25 MG/ML IJ SOLN
12.5000 mg | Freq: Four times a day (QID) | INTRAMUSCULAR | Status: DC | PRN
  Administered 2020-03-18: 02:00:00 12.5 mg via INTRAVENOUS
  Administered 2020-03-18: 22:00:00 25 mg via INTRAVENOUS
  Administered 2020-03-18: 09:00:00 12.5 mg via INTRAVENOUS
  Administered 2020-03-19: 11:00:00 25 mg via INTRAVENOUS
  Administered 2020-03-19: 12.5 mg via INTRAVENOUS
  Filled 2020-03-18 (×6): qty 1

## 2020-03-18 MED ORDER — ONDANSETRON HCL 4 MG/2ML IJ SOLN
4.0000 mg | Freq: Four times a day (QID) | INTRAMUSCULAR | Status: DC
  Administered 2020-03-18 – 2020-03-19 (×4): 4 mg via INTRAVENOUS
  Filled 2020-03-18 (×4): qty 2

## 2020-03-18 MED ORDER — POTASSIUM CHLORIDE CRYS ER 20 MEQ PO TBCR
40.0000 meq | EXTENDED_RELEASE_TABLET | Freq: Once | ORAL | Status: AC
  Administered 2020-03-18: 13:00:00 40 meq via ORAL
  Filled 2020-03-18: qty 2

## 2020-03-18 MED ORDER — LORAZEPAM 2 MG/ML IJ SOLN
0.5000 mg | Freq: Once | INTRAMUSCULAR | Status: AC
  Administered 2020-03-18: 0.5 mg via INTRAVENOUS
  Filled 2020-03-18: qty 1

## 2020-03-18 MED ORDER — MORPHINE SULFATE (PF) 2 MG/ML IV SOLN
2.0000 mg | INTRAVENOUS | Status: DC | PRN
  Administered 2020-03-18 – 2020-03-19 (×8): 2 mg via INTRAVENOUS
  Filled 2020-03-18 (×9): qty 1

## 2020-03-18 MED ORDER — PANTOPRAZOLE SODIUM 40 MG IV SOLR
40.0000 mg | Freq: Every day | INTRAVENOUS | Status: DC
  Administered 2020-03-18 – 2020-03-19 (×2): 40 mg via INTRAVENOUS
  Filled 2020-03-18 (×2): qty 40

## 2020-03-18 MED ORDER — INSULIN ASPART 100 UNIT/ML ~~LOC~~ SOLN: 0.0000 [IU] | Freq: Three times a day (TID) | SUBCUTANEOUS | Status: DC

## 2020-03-18 MED ORDER — PROMETHAZINE HCL 12.5 MG PO TABS: 12.5000 mg | ORAL_TABLET | Freq: Four times a day (QID) | ORAL | Status: DC | PRN

## 2020-03-18 MED ORDER — PANTOPRAZOLE SODIUM 40 MG IV SOLR
40.0000 mg | Freq: Once | INTRAVENOUS | Status: AC
  Administered 2020-03-18: 40 mg via INTRAVENOUS
  Filled 2020-03-18: qty 40

## 2020-03-18 MED ORDER — ENOXAPARIN SODIUM 40 MG/0.4ML ~~LOC~~ SOLN
40.0000 mg | SUBCUTANEOUS | Status: DC
  Administered 2020-03-18 – 2020-03-19 (×2): 40 mg via SUBCUTANEOUS
  Filled 2020-03-18 (×2): qty 0.4

## 2020-03-18 MED ORDER — INSULIN ASPART 100 UNIT/ML ~~LOC~~ SOLN: 0.0000 [IU] | Freq: Every day | SUBCUTANEOUS | Status: DC

## 2020-03-18 MED ORDER — PROMETHAZINE HCL 12.5 MG PO TABS
12.5000 mg | ORAL_TABLET | Freq: Four times a day (QID) | ORAL | Status: DC
  Administered 2020-03-18: 17:00:00 12.5 mg via ORAL
  Filled 2020-03-18 (×2): qty 1

## 2020-03-18 MED ORDER — SODIUM CHLORIDE 0.9 % IV SOLN: Freq: Once | INTRAVENOUS | Status: AC

## 2020-03-18 MED ORDER — ALPRAZOLAM 0.5 MG PO TABS
0.5000 mg | ORAL_TABLET | Freq: Two times a day (BID) | ORAL | Status: DC | PRN
  Administered 2020-03-18 – 2020-03-19 (×3): 0.5 mg via ORAL
  Filled 2020-03-18 (×3): qty 1
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
## 2020-03-18 NOTE — ED Notes (Signed)
Pt in bed with eyes closed at this time.

## 2020-03-18 NOTE — Progress Notes (Signed)
Per HPI: Melanie Shepard is a 54 y.o. female with medical history significant of hypertension, hyperlipidemia, diabetes mellitus, GERD and tobacco dependence presented to ED for evaluation of severe nausea and vomiting.  Patient states that she started having severe nausea and vomiting since this morning and she had vomited 4 multiple times and unable to keep anything down.  Vomiting is yellow in color with no blood or bile in it.  Patient is also complaining of severe epigastric pain with a burning sensation and the pain did not get worse or improved with anything.  Patient denies any recent changes in her medications or any diet changes.  Patient otherwise denies fever, chills, chest pain, shortness of breath, urinary symptoms and anxiety.  4/8: Patient has been admitted with intractable nausea and vomiting and has been started on IV Protonix as well as Phenergan as needed.  CT of the abdomen and pelvis without any acute abdominal pathology is noted.  She is noted to have a history of GERD and type 2 diabetes.  She is not currently on treatment for type 2 diabetes and states that she felt she only had steroid-induced hyperglycemia in the past.  Her hemoglobin A1c was noted to be 8.7% previously.  She may potentially have some element of gastroparesis or may require endoscopy for further evaluation and therefore, I have asked GI to evaluate this patient further due to ongoing symptoms.  Total care time: 25 minutes.
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
## 2020-03-18 NOTE — Progress Notes (Signed)
Patient requested some anxiety medication if possible, states takes xanax at home. Notified Dr. Manuella Ghazi of request.

## 2020-03-19 LAB — CBC
HCT: 33.7 % — ABNORMAL LOW (ref 36.0–46.0)
Hemoglobin: 10.9 g/dL — ABNORMAL LOW (ref 12.0–15.0)
MCH: 32.3 pg (ref 26.0–34.0)
MCHC: 32.3 g/dL (ref 30.0–36.0)
MCV: 100 fL (ref 80.0–100.0)
Platelets: 195 10*3/uL (ref 150–400)
RBC: 3.37 MIL/uL — ABNORMAL LOW (ref 3.87–5.11)
RDW: 13.7 % (ref 11.5–15.5)
WBC: 8.3 10*3/uL (ref 4.0–10.5)
nRBC: 0 % (ref 0.0–0.2)

## 2020-03-19 LAB — COMPREHENSIVE METABOLIC PANEL
ALT: 19 U/L (ref 0–44)
AST: 29 U/L (ref 15–41)
Albumin: 3.5 g/dL (ref 3.5–5.0)
Alkaline Phosphatase: 76 U/L (ref 38–126)
Anion gap: 9 (ref 5–15)
BUN: 8 mg/dL (ref 6–20)
CO2: 26 mmol/L (ref 22–32)
Calcium: 9.1 mg/dL (ref 8.9–10.3)
Chloride: 107 mmol/L (ref 98–111)
Creatinine, Ser: 0.66 mg/dL (ref 0.44–1.00)
GFR calc Af Amer: >60 mL/min (ref >60)
GFR calc non Af Amer: >60 mL/min (ref >60)
Glucose, Bld: 86 mg/dL (ref 70–99)
Potassium: 3.5 mmol/L (ref 3.5–5.1)
Sodium: 142 mmol/L (ref 135–145)
Total Bilirubin: 1 mg/dL (ref 0.3–1.2)
Total Protein: 6 g/dL — ABNORMAL LOW (ref 6.5–8.1)

## 2020-03-19 LAB — HEMOGLOBIN A1C
Hgb A1c MFr Bld: 6 % — ABNORMAL HIGH (ref 4.8–5.6)
Mean Plasma Glucose: 126 mg/dL

## 2020-03-19 LAB — GLUCOSE, CAPILLARY: Glucose-Capillary: 83 mg/dL (ref 70–99)

## 2020-03-19 LAB — MAGNESIUM: Magnesium: 1.8 mg/dL (ref 1.7–2.4)

## 2020-03-19 MED ORDER — MELATONIN 3 MG PO TABS: 3.0000 mg | ORAL_TABLET | Freq: Every day | ORAL | Status: DC

## 2020-03-19 NOTE — Discharge Summary (Signed)
Physician Discharge Summary  Timberly Azzarello [id number] DOB: 1966/07/18 DOA: 03/17/2020  PCP: Moshe Cipro, MD  Admit date: 03/17/2020  Discharge date: 03/19/2020  Admitted From:Home  Disposition:  Home  Recommendations for Outpatient Follow-up:  1. Follow up with PCP in 1-2 weeks 2. Follow-up with GI Dr. Gala Romney in the next 2-4 weeks as needed for any persistent symptoms 3. Continue on Zofran as needed for nausea and vomiting  Home Health: None  Equipment/Devices: None  Discharge Condition: Stable  CODE STATUS: Full  Diet recommendation: Heart Healthy  Brief/Interim Summary: Per HPI: Melanie Walkeris a 54 y.o.femalewith medical history significant ofhypertension, hyperlipidemia, diabetes mellitus, GERD and tobacco dependence presented to ED for evaluation of severe nausea and vomiting. Patient states that she started having severe nausea and vomiting since this morning and she had vomited 4 multiple times and unable to keep anything down. Vomiting is yellow in color with no blood or bile in it. Patient is also complaining of severe epigastric pain with a burning sensation and the pain did not get worse or improved with anything. Patient denies any recent changes in her medications or any diet changes. Patient otherwise denies fever, chills, chest pain, shortness of breath, urinary symptoms and anxiety.  4/8: Patient has been admitted with intractable nausea and vomiting and has been started on IV Protonix as well as Phenergan as needed.  CT of the abdomen and pelvis without any acute abdominal pathology is noted.  She is noted to have a history of GERD and type 2 diabetes.  She is not currently on treatment for type 2 diabetes and states that she felt she only had steroid-induced hyperglycemia in the past.  Her hemoglobin A1c was noted to be 8.7% previously.  She may potentially have some element of gastroparesis or may require endoscopy for further evaluation and therefore, I have  asked GI to evaluate this patient further due to ongoing symptoms.  4/9: Patient is very eager to go home and is asking to go home now especially after tolerating liquid diet.  She has no further abdominal pain, nausea or vomiting noted.  GI service was contemplating potential EGD, but patient would not like any further inpatient management or procedures at this time and would like to go home as she is tolerating her diet.  I have encouraged her to continue her Zofran as needed for any further nausea or vomiting and follow-up with GI as needed.  No other acute events noted throughout the course of this hospitalization.  Discharge Diagnoses:  Principal Problem:   Intractable nausea and vomiting Active Problems:   Current smoker   Abdominal pain, epigastric   DM type 2 (diabetes mellitus, type 2) (HCC)   Abnormal CT scan, colon  Principal discharge diagnosis: Nausea and vomiting-resolved likely secondary to cyclic vomiting syndrome versus gastroenteritis.  Discharge Instructions  Discharge Instructions    Diet - low sodium heart healthy   Complete by: As directed    Increase activity slowly   Complete by: As directed      Allergies as of 03/19/2020      Reactions   Cymbalta [duloxetine Hcl] Other (See Comments)   Makes my head do crazy things   Ivp Dye [iodinated Diagnostic Agents] Swelling   Facial swelling, pt tried premedication previously still had a reaction.    Trazodone And Nefazodone    Gives nightmares   Adhesive [tape] Swelling, Rash   Tolerates paper tape      Medication List    TAKE these  medications   acetaminophen 500 MG tablet Commonly known as: TYLENOL Take 1,000-1,500 mg by mouth every 8 (eight) hours as needed for moderate pain or headache.   ALPRAZolam 0.5 MG tablet Commonly known as: XANAX Take 0.5 mg by mouth in the morning and at bedtime.   calcium carbonate 500 MG chewable tablet Commonly known as: TUMS - dosed in mg elemental calcium Chew 2 tablets  by mouth daily as needed for indigestion or heartburn.   MELATONIN GUMMIES PO Take 3-4 capsules by mouth at bedtime.   ondansetron 4 MG tablet Commonly known as: ZOFRAN Take 1 tablet (4 mg total) by mouth every 6 (six) hours as needed for nausea or vomiting.   pantoprazole 40 MG tablet Commonly known as: PROTONIX TAKE 1 TABLET(40 MG) BY MOUTH TWICE DAILY BEFORE A MEAL What changed:   how much to take  how to take this  when to take this  additional instructions   Synthroid 150 MCG tablet Generic drug: levothyroxine Take 150 mcg by mouth every morning.   Vitamin D (Ergocalciferol) 1.25 MG (50000 UNIT) Caps capsule Commonly known as: DRISDOL Take 50,000 Units by mouth 2 (two) times a week.      Follow-up Information    Moshe Cipro, MD Follow up in 1 week(s).   Specialty: Internal Medicine Contact information: [place] [address] [phone]        Daneil Dolin, MD Follow up in 2 week(s).   Specialty: Gastroenterology Contact information: [place] [address] [phone]          Allergies  Allergen Reactions  . Cymbalta [Duloxetine Hcl] Other (See Comments)    Makes my head do crazy things  . Ivp Dye [Iodinated Diagnostic Agents] Swelling    Facial swelling, pt tried premedication previously still had a reaction.   . Trazodone And Nefazodone     Gives nightmares  . Adhesive [Tape] Swelling and Rash    Tolerates paper tape    Consultations:  GI   Procedures/Studies: CT Abdomen Pelvis Wo Contrast  Result Date: 03/17/2020 CLINICAL DATA:  Nausea, vomiting EXAM: CT ABDOMEN AND PELVIS WITHOUT CONTRAST TECHNIQUE: Multidetector CT imaging of the abdomen and pelvis was performed following the standard protocol without IV contrast. COMPARISON:  01/12/2016 FINDINGS: Lower chest: Lung bases are clear. No effusions. Heart is normal size. Hepatobiliary: No focal liver abnormality is seen. Status post  cholecystectomy. No biliary dilatation. Pancreas: No focal abnormality or ductal dilatation. Spleen: No focal abnormality.  Normal size. Adrenals/Urinary Tract: No adrenal abnormality. No focal renal abnormality. No stones or hydronephrosis. Urinary bladder is unremarkable. Stomach/Bowel: Low-density throughout the colonic wall involving the cecum, ascending colon and proximal transverse colon suggesting old inflammatory bowel disease. No acute inflammatory process. No bowel obstruction. Vascular/Lymphatic: Aortic atherosclerosis. No enlarged abdominal or pelvic lymph nodes. Reproductive: Prior hysterectomy.  No adnexal masses. Other: No free fluid or free air. Musculoskeletal: No acute bony abnormality. IMPRESSION: Prior cholecystectomy and hysterectomy. No evidence of bowel obstruction or acute inflammatory process in the abdomen or pelvis. Possible old inflammatory changes in the right colon with fatty proliferation within the wall. Aortic atherosclerosis. No acute findings. Electronically Signed   By: Rolm Baptise M.D.   On: 03/17/2020 18:30   Discharge Exam: Vitals:   03/19/20 0521 03/19/20 0814  BP: 110/62   Pulse: (!) 56   Resp: 16   Temp: 97.9 F (36.6 C)   SpO2: 97% 96%   Vitals:   03/18/20 2105  03/19/20 0208 03/19/20 0521 03/19/20 0814  BP: 127/71 (!) 104/59 110/62   Pulse: (!) 56 (!) 53 (!) 56   Resp: 18 18 16    Temp: 98.2 F (36.8 C) 98.2 F (36.8 C) 97.9 F (36.6 C)   TempSrc:      SpO2: 95% 98% 97% 96%  Weight:      Height:        General: Pt is alert, awake, not in acute distress Cardiovascular: RRR, S1/S2 +, no rubs, no gallops Respiratory: CTA bilaterally, no wheezing, no rhonchi Abdominal: Soft, NT, ND, bowel sounds + Extremities: no edema, no cyanosis    The results of significant diagnostics from this hospitalization (including imaging, microbiology, ancillary and laboratory) are listed below for reference.     Microbiology: Recent Results (from the past 240  hour(s))  Respiratory Panel by RT PCR (Flu A&B, Covid) -     Status: None   Collection Time: 03/17/20 11:11 PM  Result Value Ref Range Status   SARS Coronavirus 2 by RT PCR NEGATIVE NEGATIVE Final    Comment: (NOTE) SARS-CoV-2 target nucleic acids are NOT DETECTED. The SARS-CoV-2 RNA is generally detectable in upper respiratoy specimens during the acute phase of infection. The lowest concentration of SARS-CoV-2 viral copies this assay can detect is 131 copies/mL. A negative result does not preclude SARS-Cov-2 infection and should not be used as the sole basis for treatment or other patient management decisions. A negative result may occur with  improper specimen collection/handling, submission of specimen other than nasopharyngeal swab, presence of viral mutation(s) within the areas targeted by this assay, and inadequate number of viral copies (<131 copies/mL). A negative result must be combined with clinical observations, patient history, and epidemiological information. The expected result is Negative. Fact Sheet for Patients:  PinkCheek.be Fact Sheet for Healthcare Providers:  GravelBags.it This test is not yet ap proved or cleared by the [place] FDA and  has been authorized for detection and/or diagnosis of SARS-CoV-2 by FDA under an Emergency Use Authorization (EUA). This EUA will remain  in effect (meaning this test can be used) for the duration of the COVID-19 declaration under Section 564(b)(1) of the Act, 21 U.S.C. section 360bbb-3(b)(1), unless the authorization is terminated or revoked sooner.    Influenza A by PCR NEGATIVE NEGATIVE Final   Influenza B by PCR NEGATIVE NEGATIVE Final    Comment: (NOTE) The Xpert Xpress SARS-CoV-2/FLU/RSV assay is intended as an aid in  the diagnosis of influenza from Nasopharyngeal swab specimens and  should not be used as a sole basis for treatment. Nasal washings and   aspirates are unacceptable for Xpert Xpress SARS-CoV-2/FLU/RSV  testing. Fact Sheet for Patients: PinkCheek.be Fact Sheet for Healthcare Providers: GravelBags.it This test is not yet approved or cleared by the [place] FDA and  has been authorized for detection and/or diagnosis of SARS-CoV-2 by  FDA under an Emergency Use Authorization (EUA). This EUA will remain  in effect (meaning this test can be used) for the duration of the  Covid-19 declaration under Section 564(b)(1) of the Act, 21  U.S.C. section 360bbb-3(b)(1), unless the authorization is  terminated or revoked. Performed at [facility], [address] [place], [place] [address]      Labs: BNP (last 3 results) No results for input(s): BNP in the last 8760 hours. Basic Metabolic Panel: Recent Labs  Lab 03/17/20 1610 03/18/20 0422 03/19/20 0612  NA 141 144 142  K 3.7 3.4* 3.5  CL 103 112* 107  CO2 26 27 26   GLUCOSE 142* 104* 86  BUN 7 7 8   CREATININE 0.73 0.61 0.66  CALCIUM 9.9 8.8* 9.1  MG  --   --  1.8   Liver Function Tests: Recent Labs  Lab 03/17/20 1610 03/18/20 0422 03/19/20 0612  AST 30 16 29   ALT 24 17 19   ALKPHOS 109 82 76  BILITOT 0.8 0.7 1.0  PROT 7.9 6.0* 6.0*  ALBUMIN 4.6 3.4* 3.5   Recent Labs  Lab 03/17/20 1610  LIPASE 23   No results for input(s): AMMONIA in the last 168 hours. CBC: Recent Labs  Lab 03/17/20 1610 03/18/20 0422 03/19/20 0612  WBC 13.7* 11.8* 8.3  HGB 14.6 11.1* 10.9*  HCT 45.2 35.0* 33.7*  MCV 99.8 101.4* 100.0  PLT 307 235 195   Cardiac Enzymes: No results for input(s): CKTOTAL, CKMB, CKMBINDEX, TROPONINI in the last 168 hours. BNP: Invalid input(s): POCBNP CBG: Recent Labs  Lab 03/18/20 0823 03/18/20 1202 03/18/20 1718 03/18/20 2103 03/19/20 0824  GLUCAP 104* 88 88 84 83   D-Dimer No results for input(s): DDIMER in the last 72 hours. Hgb A1c Recent Labs    03/18/20 0422   HGBA1C 6.0*   Lipid Profile No results for input(s): CHOL, HDL, LDLCALC, TRIG, CHOLHDL, LDLDIRECT in the last 72 hours. Thyroid function studies Recent Labs    03/18/20 0752  TSH 0.174*   Anemia work up Recent Labs    03/18/20 0752  VITAMINB12 244  FOLATE 7.5  FERRITIN 63  TIBC 277  IRON 121  RETICCTPCT 1.2   Urinalysis    Component Value Date/Time   COLORURINE YELLOW 03/17/2020 1933   APPEARANCEUR CLEAR 03/17/2020 1933   LABSPEC 1.017 03/17/2020 1933   PHURINE 7.0 03/17/2020 1933   GLUCOSEU NEGATIVE 03/17/2020 1933   HGBUR NEGATIVE 03/17/2020 1933   BILIRUBINUR NEGATIVE 03/17/2020 1933   BILIRUBINUR positive 09/09/2013 1513   KETONESUR NEGATIVE 03/17/2020 1933   PROTEINUR 30 (A) 03/17/2020 1933   UROBILINOGEN 0.2 09/11/2013 1946   NITRITE NEGATIVE 03/17/2020 1933   LEUKOCYTESUR NEGATIVE 03/17/2020 1933   Sepsis Labs Invalid input(s): PROCALCITONIN,  WBC,  LACTICIDVEN Microbiology Recent Results (from the past 240 hour(s))  Respiratory Panel by RT PCR (Flu A&B, Covid) -     Status: None   Collection Time: 03/17/20 11:11 PM  Result Value Ref Range Status   SARS Coronavirus 2 by RT PCR NEGATIVE NEGATIVE Final    Comment: (NOTE) SARS-CoV-2 target nucleic acids are NOT DETECTED. The SARS-CoV-2 RNA is generally detectable in upper respiratoy specimens during the acute phase of infection. The lowest concentration of SARS-CoV-2 viral copies this assay can detect is 131 copies/mL. A negative result does not preclude SARS-Cov-2 infection and should not be used as the sole basis for treatment or other patient management decisions. A negative result may occur with  improper specimen collection/handling, submission of specimen other than nasopharyngeal swab, presence of viral mutation(s) within the areas targeted by this assay, and inadequate number of viral copies (<131 copies/mL). A negative result must be combined with clinical observations, patient history, and  epidemiological information. The expected result is Negative. Fact Sheet for Patients:  PinkCheek.be Fact Sheet for Healthcare Providers:  GravelBags.it This test is not yet ap proved or cleared by the [place] FDA and  has been authorized for detection and/or diagnosis of SARS-CoV-2 by FDA under an Emergency Use Authorization (EUA). This EUA will remain  in effect (meaning this test can be used) for the duration of  the COVID-19 declaration under Section 564(b)(1) of the Act, 21 U.S.C. section 360bbb-3(b)(1), unless the authorization is terminated or revoked sooner.    Influenza A by PCR NEGATIVE NEGATIVE Final   Influenza B by PCR NEGATIVE NEGATIVE Final    Comment: (NOTE) The Xpert Xpress SARS-CoV-2/FLU/RSV assay is intended as an aid in  the diagnosis of influenza from Nasopharyngeal swab specimens and  should not be used as a sole basis for treatment. Nasal washings and  aspirates are unacceptable for Xpert Xpress SARS-CoV-2/FLU/RSV  testing. Fact Sheet for Patients: PinkCheek.be Fact Sheet for Healthcare Providers: GravelBags.it This test is not yet approved or cleared by the [place] FDA and  has been authorized for detection and/or diagnosis of SARS-CoV-2 by  FDA under an Emergency Use Authorization (EUA). This EUA will remain  in effect (meaning this test can be used) for the duration of the  Covid-19 declaration under Section 564(b)(1) of the Act, 21  U.S.C. section 360bbb-3(b)(1), unless the authorization is  terminated or revoked. Performed at [facility], [address], [place], [place] [address]      Time coordinating discharge: 35 minutes  SIGNED:   Rodena Goldmann, DO [facility] 03/19/2020, 12:58 PM  If 7PM-7AM, please contact night-coverage [email or web address]

## 2020-03-19 NOTE — Discharge Planning (Signed)
IV removed.  RN assessment and VS revealed stability for DC to home with husband.  Discharge papers given, explained and educated.  Informed of suggested FU appts.  No scripts needed at this time.  Once ready, will be wheeled to front and family transporting home via car.

## 2020-03-19 NOTE — Care Management Important Message (Signed)
Important Message  Patient Details  Name: Melanie Shepard MRN: [id number] Date of Birth: 08/26/1966   Medicare Important Message Given:  Yes     Tommy Medal 03/19/2020, 2:43 PM

## 2020-03-19 NOTE — Progress Notes (Signed)
Subjective:  Patient no longer vomiting. States she has been nauseated for years. That part is back to her baseline. Able to keep sips down. Wants to go home as soon as possible. Asks about going home today. States she has epigastric "soreness".   Objective: Vital signs in last 24 hours: Temp:  [97.9 F (36.6 C)-98.4 F (36.9 C)] 97.9 F (36.6 C) (04/09 0521) Pulse Rate:  [53-65] 56 (04/09 0521) Resp:  [14-20] 16 (04/09 0521) BP: (104-127)/(59-71) 110/62 (04/09 0521) SpO2:  [95 %-98 %] 96 % (04/09 0814) Last BM Date: 03/17/20 General:   Alert,  Well-developed, well-nourished, pleasant and cooperative in NAD Head:  Normocephalic and atraumatic. Eyes:  Sclera clear, no icterus.  Abdomen:  Soft, mild epig tenderness and nondistended.   Extremities:  Without clubbing, deformity or edema. Neurologic:  Alert and  oriented x4;  grossly normal neurologically. Psych:  Alert and cooperative. Normal mood and affect.  Intake/Output from previous day: 04/08 0701 - 04/09 0700 In: 1102.3 [I.V.:1102.3] Out: -  Intake/Output this shift: No intake/output data recorded.  Lab Results: CBC Recent Labs    03/17/20 1610 03/18/20 0422 03/19/20 0612  WBC 13.7* 11.8* 8.3  HGB 14.6 11.1* 10.9*  HCT 45.2 35.0* 33.7*  MCV 99.8 101.4* 100.0  PLT 307 235 195   BMET Recent Labs    03/17/20 1610 03/18/20 0422 03/19/20 0612  NA 141 144 142  K 3.7 3.4* 3.5  CL 103 112* 107  CO2 26 27 26   GLUCOSE 142* 104* 86  BUN 7 7 8   CREATININE 0.73 0.61 0.66  CALCIUM 9.9 8.8* 9.1   LFTs Recent Labs    03/17/20 1610 03/18/20 0422 03/19/20 0612  BILITOT 0.8 0.7 1.0  ALKPHOS 109 82 76  AST 30 16 29   ALT 24 17 19   PROT 7.9 6.0* 6.0*  ALBUMIN 4.6 3.4* 3.5   Recent Labs    03/17/20 1610  LIPASE 23   PT/INR No results for input(s): LABPROT, INR in the last 72 hours.    Imaging Studies: CT Abdomen Pelvis Wo Contrast  Result Date: 03/17/2020 CLINICAL DATA:  Nausea, vomiting EXAM: CT ABDOMEN  AND PELVIS WITHOUT CONTRAST TECHNIQUE: Multidetector CT imaging of the abdomen and pelvis was performed following the standard protocol without IV contrast. COMPARISON:  01/12/2016 FINDINGS: Lower chest: Lung bases are clear. No effusions. Heart is normal size. Hepatobiliary: No focal liver abnormality is seen. Status post cholecystectomy. No biliary dilatation. Pancreas: No focal abnormality or ductal dilatation. Spleen: No focal abnormality.  Normal size. Adrenals/Urinary Tract: No adrenal abnormality. No focal renal abnormality. No stones or hydronephrosis. Urinary bladder is unremarkable. Stomach/Bowel: Low-density throughout the colonic wall involving the cecum, ascending colon and proximal transverse colon suggesting old inflammatory bowel disease. No acute inflammatory process. No bowel obstruction. Vascular/Lymphatic: Aortic atherosclerosis. No enlarged abdominal or pelvic lymph nodes. Reproductive: Prior hysterectomy.  No adnexal masses. Other: No free fluid or free air. Musculoskeletal: No acute bony abnormality. IMPRESSION: Prior cholecystectomy and hysterectomy. No evidence of bowel obstruction or acute inflammatory process in the abdomen or pelvis. Possible old inflammatory changes in the right colon with fatty proliferation within the wall. Aortic atherosclerosis. No acute findings. Electronically Signed   By: Rolm Baptise M.D.   On: 03/17/2020 18:30  [2 weeks]   Assessment: Pleasant 54 year old female presenting with acute onset upper abdominal pain associated with vomiting, single episode of diarrhea. Overall has been doing fairly well prior to acute onset of symptoms yesterday.  Generally her appetite has  not been normal since having Covid December 2020.    Epigastric pain, N/V:   CT as outlined with no acute inflammatory changes, possibly some old inflammatory changes in the right colon which were also seen on CT in 2014 likely unrelated to her acute illness. Colonoscopy 4 months ago  reassuring. She had similar episode in early 2020, hospitalized in [place] and treated with antibiotics for "colitis". CT at that time without contrast also showed "mucosal edema of the right colon, hepatic flexure, and transverse colon". Suspect current symptoms due to acute gastroenteritis. Doubt chronic colon findings on CT with recent normal colon on colonoscopy the source of her symptoms. Doubt IBD. Previous work-up for gastroparesis couple years back was unremarkable.  Current A1C slightly elevated at 6.0.  Feels some better today. Desires trying clear liquids.   Plan: 1. Clear liquid diet. If unable to tolerate orally, she may require EGD while inpatient.   [name]. Bernarda Caffey [facility] [phone] 4/9/202110:34 AM     LOS: 1 day

## 2020-04-12 ENCOUNTER — Other Ambulatory Visit: Payer: Self-pay

## 2020-04-12 ENCOUNTER — Telehealth: Payer: Self-pay | Admitting: Internal Medicine

## 2020-04-12 ENCOUNTER — Encounter: Payer: Self-pay | Admitting: Gastroenterology

## 2020-04-12 ENCOUNTER — Ambulatory Visit: Payer: Medicare Other | Admitting: Gastroenterology

## 2020-04-12 VITALS — BP 94/65 | HR 119 | Temp 97.5°F | Ht 67.0 in | Wt 143.2 lb

## 2020-04-12 DIAGNOSIS — R933 Abnormal findings on diagnostic imaging of other parts of digestive tract: Secondary | ICD-10-CM | POA: Diagnosis not present

## 2020-04-12 DIAGNOSIS — R634 Abnormal weight loss: Secondary | ICD-10-CM

## 2020-04-12 DIAGNOSIS — R112 Nausea with vomiting, unspecified: Secondary | ICD-10-CM | POA: Diagnosis not present

## 2020-04-12 DIAGNOSIS — K219 Gastro-esophageal reflux disease without esophagitis: Secondary | ICD-10-CM | POA: Diagnosis not present

## 2020-04-12 MED ORDER — PROMETHAZINE HCL 25 MG PO TABS: ORAL_TABLET | ORAL | 0 refills | Status: DC

## 2020-04-12 NOTE — Telephone Encounter (Signed)
Noted  

## 2020-04-12 NOTE — Assessment & Plan Note (Signed)
Improved on pantoprazole 40 mg twice daily but continues to have some breakthrough symptoms.  Continues to have chronic daily nausea, Zofran does not seem to help at times.  Phenergan previously worked better.  Plan for EGD in the near future as outlined.

## 2020-04-12 NOTE — Patient Instructions (Signed)
1. Upper endoscopy as scheduled. See separate instructions.  2. Please go for labs. You have to fast (nothing to eat or drink after midnight) and then have labs drawn between 7am and 9am. 3. RX for phenergan sent to pharmacy. You only if nausea not controlled with zofran. Can cause sedation.

## 2020-04-12 NOTE — Assessment & Plan Note (Signed)
Patient has days where she has no bowel movements, some days regular and other days multiple loose stools.  Colonoscopy unremarkable in December 2020.  Noncontrast CT during recent admission suggesting small inflammatory changes in the right colon with fatty proliferation within the wall, she also had similar findings on 2014 CT scan of the right colon.  Doubt we are dealing with IBD given normal colonoscopy in the past 6 months.  We will continue to monitor.

## 2020-04-12 NOTE — Telephone Encounter (Signed)
Patient had [facility] healthkeepers. I told patient we did not take her insurance and she INSISTED she called them and they cover.  Said if they don't she will pay bill/
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
## 2020-04-12 NOTE — Assessment & Plan Note (Addendum)
Patient has several year history of baseline daily nausea.  The symptoms have been worse especially since the summer 2020 when she had Covid.  Taste has not fully returned.  Things do not taste is good to her as they had in the past.  Her appetite is poor.  Complains of early satiety.  Weight fluctuates, baseline has been in the 160s over a year ago, in December she was 151, today she is 143.  Previous gastric emptying study several years ago was normal.  Last A1c was 6.  Last EGD in 2016.  At this time we need to repeat her EGD to further evaluate refractory GERD, persistent nausea, early satiety.  Plan for deep sedation.  I have discussed the risks, alternatives, benefits with regards to but not limited to the risk of reaction to medication, bleeding, infection, perforation and the patient is agreeable to proceed. Written consent to be obtained.  She will continue pantoprazole 40 mg twice daily before meals for now.  Continue Zofran every 4-6 hours as needed, may use Phenergan 12.5 to 25 mg every 6 hours if Zofran not effective.  Warned of sedation effects.  Prescription sent to pharmacy.  We will update labs make sure no signs of anemia.  Suspect hemoglobin at time of discharge was down due to hemodilution.  Also check fasting a.m. cortisol level.  Encouraged patient to follow through with MRI brain as scheduled.

## 2020-04-13 ENCOUNTER — Telehealth: Payer: Self-pay | Admitting: *Deleted

## 2020-04-13 NOTE — Telephone Encounter (Signed)
Needs to be scheduled for EGD with propofol with RMR.  LMOVM for pt

## 2020-04-14 NOTE — Telephone Encounter (Signed)
LMOVM for pt. Letter mailed to call °

## 2020-04-19 ENCOUNTER — Encounter (HOSPITAL_COMMUNITY): Payer: Self-pay | Admitting: *Deleted

## 2020-04-19 ENCOUNTER — Other Ambulatory Visit: Payer: Self-pay

## 2020-04-19 ENCOUNTER — Emergency Department (HOSPITAL_COMMUNITY)
Admission: EM | Admit: 2020-04-19 | Discharge: 2020-04-19 | Disposition: A | Discharge status: Home / Self Care | Payer: Medicare Other | Source: Home / Self Care | Attending: Emergency Medicine | Admitting: Emergency Medicine

## 2020-04-19 DIAGNOSIS — F17210 Nicotine dependence, cigarettes, uncomplicated: Secondary | ICD-10-CM | POA: Insufficient documentation

## 2020-04-19 DIAGNOSIS — K3184 Gastroparesis: Secondary | ICD-10-CM | POA: Diagnosis not present

## 2020-04-19 DIAGNOSIS — R112 Nausea with vomiting, unspecified: Secondary | ICD-10-CM

## 2020-04-19 DIAGNOSIS — I2510 Atherosclerotic heart disease of native coronary artery without angina pectoris: Secondary | ICD-10-CM | POA: Insufficient documentation

## 2020-04-19 DIAGNOSIS — E119 Type 2 diabetes mellitus without complications: Secondary | ICD-10-CM | POA: Insufficient documentation

## 2020-04-19 DIAGNOSIS — R1010 Upper abdominal pain, unspecified: Secondary | ICD-10-CM | POA: Insufficient documentation

## 2020-04-19 DIAGNOSIS — Z20822 Contact with and (suspected) exposure to covid-19: Secondary | ICD-10-CM | POA: Insufficient documentation

## 2020-04-19 DIAGNOSIS — Z79899 Other long term (current) drug therapy: Secondary | ICD-10-CM | POA: Insufficient documentation

## 2020-04-19 DIAGNOSIS — J449 Chronic obstructive pulmonary disease, unspecified: Secondary | ICD-10-CM | POA: Insufficient documentation

## 2020-04-19 DIAGNOSIS — E039 Hypothyroidism, unspecified: Secondary | ICD-10-CM | POA: Insufficient documentation

## 2020-04-19 LAB — CBC WITH DIFFERENTIAL/PLATELET
Abs Immature Granulocytes: 0.06 10*3/uL (ref 0.00–0.07)
Basophils Absolute: 0.1 10*3/uL (ref 0.0–0.1)
Basophils Relative: 1 %
Eosinophils Absolute: 0.2 10*3/uL (ref 0.0–0.5)
Eosinophils Relative: 2 %
HCT: 44.2 % (ref 36.0–46.0)
Hemoglobin: 14.6 g/dL (ref 12.0–15.0)
Immature Granulocytes: 1 %
Lymphocytes Relative: 24 %
Lymphs Abs: 3.1 10*3/uL (ref 0.7–4.0)
MCH: 32.6 pg (ref 26.0–34.0)
MCHC: 33 g/dL (ref 30.0–36.0)
MCV: 98.7 fL (ref 80.0–100.0)
Monocytes Absolute: 0.4 10*3/uL (ref 0.1–1.0)
Monocytes Relative: 3 %
Neutro Abs: 8.9 10*3/uL — ABNORMAL HIGH (ref 1.7–7.7)
Neutrophils Relative %: 69 %
Platelets: 370 10*3/uL (ref 150–400)
RBC: 4.48 MIL/uL (ref 3.87–5.11)
RDW: 13.4 % (ref 11.5–15.5)
WBC: 12.7 10*3/uL — ABNORMAL HIGH (ref 4.0–10.5)
nRBC: 0 % (ref 0.0–0.2)

## 2020-04-19 LAB — COMPREHENSIVE METABOLIC PANEL
ALT: 23 U/L (ref 0–44)
AST: 31 U/L (ref 15–41)
Albumin: 4.6 g/dL (ref 3.5–5.0)
Alkaline Phosphatase: 119 U/L (ref 38–126)
Anion gap: 15 (ref 5–15)
BUN: 7 mg/dL (ref 6–20)
CO2: 25 mmol/L (ref 22–32)
Calcium: 9.8 mg/dL (ref 8.9–10.3)
Chloride: 99 mmol/L (ref 98–111)
Creatinine, Ser: 0.59 mg/dL (ref 0.44–1.00)
GFR calc Af Amer: >60 mL/min (ref >60)
GFR calc non Af Amer: >60 mL/min (ref >60)
Glucose, Bld: 147 mg/dL — ABNORMAL HIGH (ref 70–99)
Potassium: 3.4 mmol/L — ABNORMAL LOW (ref 3.5–5.1)
Sodium: 139 mmol/L (ref 135–145)
Total Bilirubin: 0.9 mg/dL (ref 0.3–1.2)
Total Protein: 8.4 g/dL — ABNORMAL HIGH (ref 6.5–8.1)

## 2020-04-19 LAB — LIPASE, BLOOD: Lipase: 24 U/L (ref 11–51)

## 2020-04-19 LAB — SARS CORONAVIRUS 2 BY RT PCR (HOSPITAL ORDER, PERFORMED IN ~~LOC~~ HOSPITAL LAB): SARS Coronavirus 2: NEGATIVE

## 2020-04-19 MED ORDER — PANTOPRAZOLE SODIUM 40 MG IV SOLR
40.0000 mg | Freq: Once | INTRAVENOUS | Status: AC
  Administered 2020-04-19: 40 mg via INTRAVENOUS
  Filled 2020-04-19: qty 40

## 2020-04-19 MED ORDER — DROPERIDOL 2.5 MG/ML IJ SOLN
1.2500 mg | Freq: Once | INTRAMUSCULAR | Status: AC
  Administered 2020-04-19: 1.25 mg via INTRAVENOUS
  Filled 2020-04-19: qty 2

## 2020-04-19 MED ORDER — SODIUM CHLORIDE 0.9 % IV BOLUS
1000.0000 mL | Freq: Once | INTRAVENOUS | Status: AC
  Administered 2020-04-19: 18:00:00 1000 mL via INTRAVENOUS

## 2020-04-19 MED ORDER — FAMOTIDINE IN NACL 20-0.9 MG/50ML-% IV SOLN
20.0000 mg | Freq: Once | INTRAVENOUS | Status: AC
  Administered 2020-04-19: 20 mg via INTRAVENOUS
  Filled 2020-04-19: qty 50

## 2020-04-19 MED ORDER — HYDROMORPHONE HCL 1 MG/ML IJ SOLN
0.5000 mg | Freq: Once | INTRAMUSCULAR | Status: AC
  Administered 2020-04-19: 0.5 mg via INTRAVENOUS
  Filled 2020-04-19: qty 1

## 2020-04-19 MED ORDER — PROMETHAZINE HCL 25 MG PO TABS: 12.5000 mg | ORAL_TABLET | Freq: Four times a day (QID) | ORAL | 0 refills | Status: DC | PRN

## 2020-04-19 MED ORDER — PROMETHAZINE HCL 25 MG/ML IJ SOLN
25.0000 mg | Freq: Once | INTRAMUSCULAR | Status: AC
  Administered 2020-04-19: 25 mg via INTRAVENOUS
  Filled 2020-04-19: qty 1

## 2020-04-19 MED ORDER — ONDANSETRON 4 MG PO TBDP
4.0000 mg | ORAL_TABLET | Freq: Once | ORAL | Status: AC | PRN
  Administered 2020-04-19: 4 mg via ORAL
  Filled 2020-04-19: qty 1
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
## 2020-04-19 NOTE — ED Triage Notes (Signed)
Headache with vomiting  

## 2020-04-19 NOTE — Discharge Instructions (Addendum)
You are seen in the emergency department for headache nausea and vomiting.  Your symptoms improved with some fluid and pain medicine and nausea medicine.  You were offered admission but you felt you would do better going home.  Please contact your primary care and GI doctor for close follow-up.  Return to the emergency department if any worsening or concerning symptoms

## 2020-04-20 ENCOUNTER — Emergency Department (HOSPITAL_COMMUNITY): Payer: Medicare Other

## 2020-04-20 ENCOUNTER — Other Ambulatory Visit: Payer: Self-pay

## 2020-04-20 ENCOUNTER — Observation Stay (HOSPITAL_COMMUNITY): Payer: Medicare Other | Admitting: Anesthesiology

## 2020-04-20 ENCOUNTER — Encounter (HOSPITAL_COMMUNITY)
Admission: EM | Discharge status: Against Medical Advice | Payer: Self-pay | Source: Home / Self Care | Attending: Internal Medicine

## 2020-04-20 ENCOUNTER — Inpatient Hospital Stay (HOSPITAL_COMMUNITY)
Admission: EM | Admit: 2020-04-20 | Discharge: 2020-04-22 | DRG: 392 | Discharge status: Against Medical Advice | Payer: Medicare Other | Attending: Internal Medicine | Admitting: Internal Medicine

## 2020-04-20 ENCOUNTER — Encounter (HOSPITAL_COMMUNITY): Payer: Self-pay | Admitting: Emergency Medicine

## 2020-04-20 DIAGNOSIS — Z79899 Other long term (current) drug therapy: Secondary | ICD-10-CM

## 2020-04-20 DIAGNOSIS — Z8249 Family history of ischemic heart disease and other diseases of the circulatory system: Secondary | ICD-10-CM

## 2020-04-20 DIAGNOSIS — R112 Nausea with vomiting, unspecified: Secondary | ICD-10-CM

## 2020-04-20 DIAGNOSIS — Z9049 Acquired absence of other specified parts of digestive tract: Secondary | ICD-10-CM

## 2020-04-20 DIAGNOSIS — K3189 Other diseases of stomach and duodenum: Secondary | ICD-10-CM

## 2020-04-20 DIAGNOSIS — Z8616 Personal history of COVID-19: Secondary | ICD-10-CM

## 2020-04-20 DIAGNOSIS — R1110 Vomiting, unspecified: Secondary | ICD-10-CM | POA: Diagnosis present

## 2020-04-20 DIAGNOSIS — R1013 Epigastric pain: Secondary | ICD-10-CM | POA: Diagnosis not present

## 2020-04-20 DIAGNOSIS — Z91041 Radiographic dye allergy status: Secondary | ICD-10-CM

## 2020-04-20 DIAGNOSIS — K3184 Gastroparesis: Principal | ICD-10-CM | POA: Diagnosis present

## 2020-04-20 DIAGNOSIS — Z90710 Acquired absence of both cervix and uterus: Secondary | ICD-10-CM

## 2020-04-20 DIAGNOSIS — E782 Mixed hyperlipidemia: Secondary | ICD-10-CM | POA: Diagnosis present

## 2020-04-20 DIAGNOSIS — Z9851 Tubal ligation status: Secondary | ICD-10-CM

## 2020-04-20 DIAGNOSIS — F17200 Nicotine dependence, unspecified, uncomplicated: Secondary | ICD-10-CM | POA: Diagnosis present

## 2020-04-20 DIAGNOSIS — Z720 Tobacco use: Secondary | ICD-10-CM | POA: Diagnosis present

## 2020-04-20 DIAGNOSIS — F17210 Nicotine dependence, cigarettes, uncomplicated: Secondary | ICD-10-CM | POA: Diagnosis present

## 2020-04-20 DIAGNOSIS — Z91048 Other nonmedicinal substance allergy status: Secondary | ICD-10-CM

## 2020-04-20 DIAGNOSIS — F411 Generalized anxiety disorder: Secondary | ICD-10-CM | POA: Diagnosis present

## 2020-04-20 DIAGNOSIS — Z5329 Procedure and treatment not carried out because of patient's decision for other reasons: Secondary | ICD-10-CM | POA: Diagnosis not present

## 2020-04-20 DIAGNOSIS — R7303 Prediabetes: Secondary | ICD-10-CM | POA: Diagnosis present

## 2020-04-20 DIAGNOSIS — I2510 Atherosclerotic heart disease of native coronary artery without angina pectoris: Secondary | ICD-10-CM | POA: Diagnosis present

## 2020-04-20 DIAGNOSIS — Z79890 Hormone replacement therapy (postmenopausal): Secondary | ICD-10-CM

## 2020-04-20 DIAGNOSIS — Z86010 Personal history of colonic polyps: Secondary | ICD-10-CM

## 2020-04-20 DIAGNOSIS — E063 Autoimmune thyroiditis: Secondary | ICD-10-CM | POA: Diagnosis present

## 2020-04-20 DIAGNOSIS — Z800 Family history of malignant neoplasm of digestive organs: Secondary | ICD-10-CM

## 2020-04-20 DIAGNOSIS — K219 Gastro-esophageal reflux disease without esophagitis: Secondary | ICD-10-CM | POA: Diagnosis present

## 2020-04-20 DIAGNOSIS — Z888 Allergy status to other drugs, medicaments and biological substances status: Secondary | ICD-10-CM

## 2020-04-20 HISTORY — PX: ESOPHAGOGASTRODUODENOSCOPY (EGD) WITH PROPOFOL: SHX5813

## 2020-04-20 LAB — COMPREHENSIVE METABOLIC PANEL
ALT: 20 U/L (ref 0–44)
AST: 23 U/L (ref 15–41)
Albumin: 4.3 g/dL (ref 3.5–5.0)
Alkaline Phosphatase: 108 U/L (ref 38–126)
Anion gap: 12 (ref 5–15)
BUN: 9 mg/dL (ref 6–20)
CO2: 26 mmol/L (ref 22–32)
Calcium: 9.2 mg/dL (ref 8.9–10.3)
Chloride: 104 mmol/L (ref 98–111)
Creatinine, Ser: 0.63 mg/dL (ref 0.44–1.00)
GFR calc Af Amer: >60 mL/min (ref >60)
GFR calc non Af Amer: >60 mL/min (ref >60)
Glucose, Bld: 141 mg/dL — ABNORMAL HIGH (ref 70–99)
Potassium: 3.6 mmol/L (ref 3.5–5.1)
Sodium: 142 mmol/L (ref 135–145)
Total Bilirubin: 1 mg/dL (ref 0.3–1.2)
Total Protein: 8 g/dL (ref 6.5–8.1)

## 2020-04-20 LAB — GLUCOSE, CAPILLARY
Glucose-Capillary: 103 mg/dL — ABNORMAL HIGH (ref 70–99)
Glucose-Capillary: 83 mg/dL (ref 70–99)
Glucose-Capillary: 94 mg/dL (ref 70–99)

## 2020-04-20 LAB — CBC WITH DIFFERENTIAL/PLATELET
Abs Immature Granulocytes: 0.05 10*3/uL (ref 0.00–0.07)
Basophils Absolute: 0.1 10*3/uL (ref 0.0–0.1)
Basophils Relative: 1 %
Eosinophils Absolute: 0.1 10*3/uL (ref 0.0–0.5)
Eosinophils Relative: 1 %
HCT: 42.9 % (ref 36.0–46.0)
Hemoglobin: 13.7 g/dL (ref 12.0–15.0)
Immature Granulocytes: 0 %
Lymphocytes Relative: 23 %
Lymphs Abs: 3 10*3/uL (ref 0.7–4.0)
MCH: 32.3 pg (ref 26.0–34.0)
MCHC: 31.9 g/dL (ref 30.0–36.0)
MCV: 101.2 fL — ABNORMAL HIGH (ref 80.0–100.0)
Monocytes Absolute: 0.4 10*3/uL (ref 0.1–1.0)
Monocytes Relative: 3 %
Neutro Abs: 9.2 10*3/uL — ABNORMAL HIGH (ref 1.7–7.7)
Neutrophils Relative %: 72 %
Platelets: 305 10*3/uL (ref 150–400)
RBC: 4.24 MIL/uL (ref 3.87–5.11)
RDW: 13.6 % (ref 11.5–15.5)
WBC: 12.9 10*3/uL — ABNORMAL HIGH (ref 4.0–10.5)
nRBC: 0 % (ref 0.0–0.2)

## 2020-04-20 LAB — TSH: TSH: 0.143 u[IU]/mL — ABNORMAL LOW (ref 0.350–4.500)

## 2020-04-20 LAB — HEMOGLOBIN A1C
Hgb A1c MFr Bld: 5.8 % — ABNORMAL HIGH (ref 4.8–5.6)
Mean Plasma Glucose: 119.76 mg/dL

## 2020-04-20 LAB — CORTISOL-AM, BLOOD: Cortisol - AM: 17.4 ug/dL (ref 6.7–22.6)

## 2020-04-20 LAB — LIPASE, BLOOD: Lipase: 24 U/L (ref 11–51)

## 2020-04-20 SURGERY — ESOPHAGOGASTRODUODENOSCOPY (EGD) WITH PROPOFOL
Anesthesia: General

## 2020-04-20 MED ORDER — INSULIN ASPART 100 UNIT/ML ~~LOC~~ SOLN: 0.0000 [IU] | Freq: Three times a day (TID) | SUBCUTANEOUS | Status: DC

## 2020-04-20 MED ORDER — MIDAZOLAM HCL 2 MG/2ML IJ SOLN
INTRAMUSCULAR | Status: AC
  Filled 2020-04-20: qty 2

## 2020-04-20 MED ORDER — PROPOFOL 10 MG/ML IV BOLUS
INTRAVENOUS | Status: AC
  Filled 2020-04-20: qty 20

## 2020-04-20 MED ORDER — ACETAMINOPHEN 650 MG RE SUPP: 650.0000 mg | Freq: Four times a day (QID) | RECTAL | Status: DC | PRN

## 2020-04-20 MED ORDER — LACTATED RINGERS IV SOLN: Freq: Once | INTRAVENOUS | Status: DC

## 2020-04-20 MED ORDER — ENOXAPARIN SODIUM 40 MG/0.4ML ~~LOC~~ SOLN
40.0000 mg | SUBCUTANEOUS | Status: DC
  Administered 2020-04-21: 40 mg via SUBCUTANEOUS
  Filled 2020-04-20: qty 0.4

## 2020-04-20 MED ORDER — SODIUM CHLORIDE 0.9 % IV BOLUS
1000.0000 mL | Freq: Once | INTRAVENOUS | Status: AC
  Administered 2020-04-20: 1000 mL via INTRAVENOUS

## 2020-04-20 MED ORDER — PANTOPRAZOLE SODIUM 40 MG IV SOLR
40.0000 mg | Freq: Two times a day (BID) | INTRAVENOUS | Status: DC
  Administered 2020-04-20 – 2020-04-21 (×3): 40 mg via INTRAVENOUS
  Filled 2020-04-20 (×3): qty 40

## 2020-04-20 MED ORDER — PROMETHAZINE HCL 25 MG/ML IJ SOLN
12.5000 mg | Freq: Four times a day (QID) | INTRAMUSCULAR | Status: DC | PRN
  Administered 2020-04-20 – 2020-04-21 (×3): 12.5 mg via INTRAVENOUS
  Filled 2020-04-20 (×4): qty 1

## 2020-04-20 MED ORDER — MIDAZOLAM HCL 2 MG/2ML IJ SOLN
INTRAMUSCULAR | Status: DC | PRN
  Administered 2020-04-20: 1 mg via INTRAVENOUS

## 2020-04-20 MED ORDER — ENOXAPARIN SODIUM 40 MG/0.4ML ~~LOC~~ SOLN
40.0000 mg | SUBCUTANEOUS | Status: DC
  Filled 2020-04-20: qty 0.4

## 2020-04-20 MED ORDER — ONDANSETRON HCL 4 MG/2ML IJ SOLN
4.0000 mg | Freq: Four times a day (QID) | INTRAMUSCULAR | Status: DC | PRN
  Administered 2020-04-20 – 2020-04-21 (×2): 4 mg via INTRAVENOUS
  Filled 2020-04-20 (×2): qty 2

## 2020-04-20 MED ORDER — GLYCOPYRROLATE PF 0.2 MG/ML IJ SOSY
PREFILLED_SYRINGE | INTRAMUSCULAR | Status: AC
  Filled 2020-04-20: qty 3

## 2020-04-20 MED ORDER — LIDOCAINE 2% (20 MG/ML) 5 ML SYRINGE
INTRAMUSCULAR | Status: AC
  Filled 2020-04-20: qty 10

## 2020-04-20 MED ORDER — PROPOFOL 10 MG/ML IV BOLUS
INTRAVENOUS | Status: AC
  Filled 2020-04-20: qty 40

## 2020-04-20 MED ORDER — PANTOPRAZOLE SODIUM 40 MG IV SOLR
40.0000 mg | Freq: Once | INTRAVENOUS | Status: AC
  Administered 2020-04-20: 40 mg via INTRAVENOUS
  Filled 2020-04-20: qty 40

## 2020-04-20 MED ORDER — NICOTINE 21 MG/24HR TD PT24
21.0000 mg | MEDICATED_PATCH | Freq: Every day | TRANSDERMAL | Status: DC
  Administered 2020-04-22: 21 mg via TRANSDERMAL
  Filled 2020-04-20 (×2): qty 1

## 2020-04-20 MED ORDER — POTASSIUM CHLORIDE IN NACL 20-0.9 MEQ/L-% IV SOLN: INTRAVENOUS | Status: DC

## 2020-04-20 MED ORDER — METOCLOPRAMIDE HCL 5 MG/ML IJ SOLN
5.0000 mg | Freq: Once | INTRAMUSCULAR | Status: AC
  Administered 2020-04-20: 5 mg via INTRAVENOUS
  Filled 2020-04-20: qty 2

## 2020-04-20 MED ORDER — PROPOFOL 10 MG/ML IV BOLUS
INTRAVENOUS | Status: DC | PRN
  Administered 2020-04-20: 40 mg via INTRAVENOUS
  Administered 2020-04-20 (×4): 20 mg via INTRAVENOUS

## 2020-04-20 MED ORDER — ONDANSETRON HCL 4 MG PO TABS
4.0000 mg | ORAL_TABLET | Freq: Four times a day (QID) | ORAL | Status: DC | PRN
  Administered 2020-04-22: 4 mg via ORAL
  Filled 2020-04-20: qty 1

## 2020-04-20 MED ORDER — LEVOTHYROXINE SODIUM 75 MCG PO TABS
150.0000 ug | ORAL_TABLET | Freq: Every morning | ORAL | Status: DC
  Filled 2020-04-20: qty 2

## 2020-04-20 MED ORDER — BOOST / RESOURCE BREEZE PO LIQD CUSTOM
1.0000 | Freq: Three times a day (TID) | ORAL | Status: DC
  Administered 2020-04-20 – 2020-04-21 (×3): 1 via ORAL

## 2020-04-20 MED ORDER — LIDOCAINE VISCOUS HCL 2 % MT SOLN
15.0000 mL | Freq: Once | OROMUCOSAL | Status: AC
  Administered 2020-04-20: 5 mL via OROMUCOSAL
  Filled 2020-04-20: qty 15

## 2020-04-20 MED ORDER — ACETAMINOPHEN 325 MG PO TABS
650.0000 mg | ORAL_TABLET | Freq: Four times a day (QID) | ORAL | Status: DC | PRN
  Administered 2020-04-22: 650 mg via ORAL
  Filled 2020-04-20: qty 2

## 2020-04-20 MED ORDER — LIDOCAINE HCL (CARDIAC) PF 100 MG/5ML IV SOSY
PREFILLED_SYRINGE | INTRAVENOUS | Status: DC | PRN
  Administered 2020-04-20: 50 mg via INTRAVENOUS

## 2020-04-20 MED ORDER — HALOPERIDOL LACTATE 5 MG/ML IJ SOLN
2.0000 mg | Freq: Once | INTRAMUSCULAR | Status: AC
  Administered 2020-04-20: 2 mg via INTRAVENOUS
  Filled 2020-04-20: qty 1

## 2020-04-20 MED ORDER — SODIUM CHLORIDE 0.9 % IV SOLN: INTRAVENOUS | Status: DC

## 2020-04-20 MED ORDER — ONDANSETRON HCL 4 MG/2ML IJ SOLN
4.0000 mg | Freq: Four times a day (QID) | INTRAMUSCULAR | Status: DC
  Administered 2020-04-20 – 2020-04-22 (×6): 4 mg via INTRAVENOUS
  Filled 2020-04-20 (×8): qty 2

## 2020-04-20 MED ORDER — MORPHINE SULFATE (PF) 4 MG/ML IV SOLN
4.0000 mg | Freq: Once | INTRAVENOUS | Status: AC
  Administered 2020-04-20: 4 mg via INTRAVENOUS
  Filled 2020-04-20: qty 1

## 2020-04-20 MED ORDER — PROPOFOL 500 MG/50ML IV EMUL
INTRAVENOUS | Status: DC | PRN
  Administered 2020-04-20: 150 ug/kg/min via INTRAVENOUS

## 2020-04-20 MED ORDER — ALPRAZOLAM 0.5 MG PO TABS
0.5000 mg | ORAL_TABLET | Freq: Two times a day (BID) | ORAL | Status: DC | PRN
  Administered 2020-04-20 – 2020-04-22 (×5): 0.5 mg via ORAL
  Filled 2020-04-20 (×5): qty 1

## 2020-04-20 NOTE — Op Note (Signed)
[facility] Patient Name: Melanie Shepard Procedure Date: 04/20/2020 1:26 PM MRN: [id number] Date of Birth: 05-16-1966 Attending MD: Hildred Laser , MD CSN: [id number] Age: 54 Admit Type: Inpatient Procedure:                Upper GI endoscopy Indications:              Epigastric abdominal pain, Nausea with vomiting Providers:                Hildred Laser, MD, Janeece Riggers, RN, Nelma Rothman,                            Technician Referring MD:             Orson Eva, DO Medicines:                Propofol per Anesthesia Complications:            No immediate complications. Estimated Blood Loss:     Estimated blood loss was minimal. Procedure:                Pre-Anesthesia Assessment:                           - Prior to the procedure, a History and Physical                            was performed, and patient medications and                            allergies were reviewed. The patient's tolerance of                            previous anesthesia was also reviewed. The risks                            and benefits of the procedure and the sedation                            options and risks were discussed with the patient.                            All questions were answered, and informed consent                            was obtained. Prior Anticoagulants: The patient has                            taken no previous anticoagulant or antiplatelet                            agents. ASA Grade Assessment: III - A patient with                            severe systemic disease. After reviewing the risks  and benefits, the patient was deemed in                            satisfactory condition to undergo the procedure.                           After obtaining informed consent, the endoscope was                            passed under direct vision. Throughout the                            procedure, the patient's blood pressure, pulse, and        oxygen saturations were monitored continuously. The                            [id number]) was introduced through the                            mouth, and advanced to the second part of duodenum.                            The upper GI endoscopy was accomplished without                            difficulty. The patient tolerated the procedure                            well. Scope In: 1:34:29 PM Scope Out: 1:43:42 PM Total Procedure Duration: 0 hours 9 minutes 13 seconds  Findings:      The hypopharynx was normal.      The examined esophagus was normal.      The Z-line was regular and was found 40 cm from the incisors.      Patchy mildly erythematous mucosa without bleeding was found in the       gastric antrum. Biopsies were taken with a cold forceps for histology.       The pathology specimen was placed into Bottle Number 2.      The exam of the stomach was otherwise normal.      The duodenal bulb and second portion of the duodenum were normal.       Biopsies for histology were taken with a cold forceps for evaluation of       celiac disease. The pathology specimen was placed into Bottle Number 1. Impression:               - Normal hypopharynx.                           - Normal esophagus.                           - Z-line regular, 40 cm from the incisors.                           - Erythematous mucosa in the antrum. Biopsied.                           -  Normal duodenal bulb and second portion of the                            duodenum. Biopsied. Moderate Sedation:      Per Anesthesia Care Recommendation:           - Patient has a contact number available for                            emergencies. The signs and symptoms of potential                            delayed complications were discussed with the                            patient. Return to normal activities tomorrow.                            Written discharge instructions were provided to the                             patient.                           - Clear liquid diet today.                           - Continue present medications.                           - Resume Lovenox (enoxaparin) at prior dose                            tomorrow.                           - Await pathology results. Procedure Code(s):        --- Professional ---                           [phone], Esophagogastroduodenoscopy, flexible,                            transoral; with biopsy, single or multiple Diagnosis Code(s):        --- Professional ---                           K31.89, Other diseases of stomach and duodenum                           R10.13, Epigastric pain                           R11.2, Nausea with vomiting, unspecified CPT copyright 2019 [employer]. All rights reserved. The codes documented in this report are preliminary and upon coder review may  be revised to meet current compliance requirements. Hildred Laser, MD Hildred Laser, MD 04/20/2020 4:11:08 PM This  report has been signed electronically. Number of Addenda: 0
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
## 2020-04-20 NOTE — Transfer of Care (Signed)
Immediate Anesthesia Transfer of Care Note  Patient: Melanie Shepard  Procedure(s) Performed: ESOPHAGOGASTRODUODENOSCOPY (EGD) WITH PROPOFOL (N/A )  Patient Location: PACU  Anesthesia Type:MAC and General  Level of Consciousness: awake, alert  and patient cooperative  Airway & Oxygen Therapy: Patient Spontanous Breathing  Post-op Assessment: Report given to RN and Post -op Vital signs reviewed and stable  Post vital signs: Reviewed and stable  Last Vitals:  Vitals Value Taken Time  BP    Temp    Pulse 75 04/20/20 1351  Resp 18 04/20/20 1351  SpO2 99 % 04/20/20 1351  Vitals shown include unvalidated device data.  Last Pain:  Vitals:   04/20/20 1314  TempSrc: Oral  PainSc: 0-No pain      Patients Stated Pain Goal: 5 (57/32/25 6720)  Complications: No apparent anesthesia complications

## 2020-04-20 NOTE — Progress Notes (Signed)
Brief EGD note.  Normal hypopharynx. Normal mucosa of the esophagus and GE junction located at 40 cm from the incisors. Patchy erythema to antral mucosa otherwise normal examined the stomach.  Antral biopsies taken. Normal bulbar and post bulbar mucosa.  Random biopsies taken from post bulbar mucosa.
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
## 2020-04-20 NOTE — ED Notes (Signed)
Pt states the meds did not help, edp notified.

## 2020-04-20 NOTE — Anesthesia Postprocedure Evaluation (Signed)
Anesthesia Post Note  Patient: Melanie Shepard  Procedure(s) Performed: ESOPHAGOGASTRODUODENOSCOPY (EGD) WITH PROPOFOL (N/A )  Patient location during evaluation: PACU Anesthesia Type: General and MAC Level of consciousness: awake Pain management: pain level controlled Vital Signs Assessment: post-procedure vital signs reviewed and stable Respiratory status: spontaneous breathing Cardiovascular status: stable Postop Assessment: no apparent nausea or vomiting Anesthetic complications: no     Last Vitals:  Vitals:   04/20/20 1314 04/20/20 1350  BP: 112/61 101/68  Pulse: 62 78  Resp: 20 18  Temp:  36.7 C  SpO2:  99%    Last Pain:  Vitals:   04/20/20 1350  TempSrc:   PainSc: 0-No pain                 Everette Rank

## 2020-04-20 NOTE — ED Triage Notes (Signed)
Pt c/o emesis and headache, she was seen yesterday for the same.

## 2020-04-20 NOTE — ED Notes (Signed)
Pt states nothing has helped her nausea or pain, edp notified.
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
## 2020-04-20 NOTE — ED Notes (Signed)
Given water and lemon lime shasta.  tolerating well.

## 2020-04-20 NOTE — Care Management Obs Status (Signed)
[facility] NOTIFICATION   Patient Details  Name: Mayuri Nuzum MRN: [id number] Date of Birth: Jan 13, 1966   Medicare Observation Status Notification Given:  Yes    Sherie Don, LCSW 04/20/2020, 4:46 PM
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
## 2020-04-21 ENCOUNTER — Other Ambulatory Visit: Payer: Self-pay

## 2020-04-21 DIAGNOSIS — E7439 Other disorders of intestinal carbohydrate absorption: Secondary | ICD-10-CM | POA: Diagnosis not present

## 2020-04-21 DIAGNOSIS — F17210 Nicotine dependence, cigarettes, uncomplicated: Secondary | ICD-10-CM | POA: Diagnosis present

## 2020-04-21 DIAGNOSIS — Z9851 Tubal ligation status: Secondary | ICD-10-CM | POA: Diagnosis not present

## 2020-04-21 DIAGNOSIS — Z888 Allergy status to other drugs, medicaments and biological substances status: Secondary | ICD-10-CM | POA: Diagnosis not present

## 2020-04-21 DIAGNOSIS — F411 Generalized anxiety disorder: Secondary | ICD-10-CM | POA: Diagnosis present

## 2020-04-21 DIAGNOSIS — Z8249 Family history of ischemic heart disease and other diseases of the circulatory system: Secondary | ICD-10-CM | POA: Diagnosis not present

## 2020-04-21 DIAGNOSIS — Z8616 Personal history of COVID-19: Secondary | ICD-10-CM | POA: Diagnosis not present

## 2020-04-21 DIAGNOSIS — K3184 Gastroparesis: Secondary | ICD-10-CM | POA: Diagnosis present

## 2020-04-21 DIAGNOSIS — Z79899 Other long term (current) drug therapy: Secondary | ICD-10-CM | POA: Diagnosis not present

## 2020-04-21 DIAGNOSIS — K219 Gastro-esophageal reflux disease without esophagitis: Secondary | ICD-10-CM | POA: Diagnosis present

## 2020-04-21 DIAGNOSIS — Z5329 Procedure and treatment not carried out because of patient's decision for other reasons: Secondary | ICD-10-CM | POA: Diagnosis not present

## 2020-04-21 DIAGNOSIS — F17200 Nicotine dependence, unspecified, uncomplicated: Secondary | ICD-10-CM | POA: Diagnosis not present

## 2020-04-21 DIAGNOSIS — Z86010 Personal history of colonic polyps: Secondary | ICD-10-CM | POA: Diagnosis not present

## 2020-04-21 DIAGNOSIS — R112 Nausea with vomiting, unspecified: Secondary | ICD-10-CM | POA: Diagnosis present

## 2020-04-21 DIAGNOSIS — E782 Mixed hyperlipidemia: Secondary | ICD-10-CM | POA: Diagnosis present

## 2020-04-21 DIAGNOSIS — E063 Autoimmune thyroiditis: Secondary | ICD-10-CM | POA: Diagnosis present

## 2020-04-21 DIAGNOSIS — Z91041 Radiographic dye allergy status: Secondary | ICD-10-CM | POA: Diagnosis not present

## 2020-04-21 DIAGNOSIS — Z90710 Acquired absence of both cervix and uterus: Secondary | ICD-10-CM | POA: Diagnosis not present

## 2020-04-21 DIAGNOSIS — Z79890 Hormone replacement therapy (postmenopausal): Secondary | ICD-10-CM | POA: Diagnosis not present

## 2020-04-21 DIAGNOSIS — I2510 Atherosclerotic heart disease of native coronary artery without angina pectoris: Secondary | ICD-10-CM | POA: Diagnosis present

## 2020-04-21 DIAGNOSIS — Z9049 Acquired absence of other specified parts of digestive tract: Secondary | ICD-10-CM | POA: Diagnosis not present

## 2020-04-21 DIAGNOSIS — Z800 Family history of malignant neoplasm of digestive organs: Secondary | ICD-10-CM | POA: Diagnosis not present

## 2020-04-21 DIAGNOSIS — R7303 Prediabetes: Secondary | ICD-10-CM | POA: Diagnosis present

## 2020-04-21 DIAGNOSIS — Z91048 Other nonmedicinal substance allergy status: Secondary | ICD-10-CM | POA: Diagnosis not present

## 2020-04-21 LAB — CBC
HCT: 41.3 % (ref 36.0–46.0)
Hemoglobin: 13.3 g/dL (ref 12.0–15.0)
MCH: 32.7 pg (ref 26.0–34.0)
MCHC: 32.2 g/dL (ref 30.0–36.0)
MCV: 101.5 fL — ABNORMAL HIGH (ref 80.0–100.0)
Platelets: 273 10*3/uL (ref 150–400)
RBC: 4.07 MIL/uL (ref 3.87–5.11)
RDW: 13.2 % (ref 11.5–15.5)
WBC: 10.6 10*3/uL — ABNORMAL HIGH (ref 4.0–10.5)
nRBC: 0 % (ref 0.0–0.2)

## 2020-04-21 LAB — RAPID URINE DRUG SCREEN, HOSP PERFORMED
Amphetamines: NOT DETECTED
Barbiturates: NOT DETECTED
Benzodiazepines: POSITIVE — AB
Cocaine: NOT DETECTED
Opiates: NOT DETECTED
Tetrahydrocannabinol: NOT DETECTED

## 2020-04-21 LAB — SURGICAL PATHOLOGY

## 2020-04-21 LAB — BASIC METABOLIC PANEL
Anion gap: 12 (ref 5–15)
BUN: 9 mg/dL (ref 6–20)
CO2: 21 mmol/L — ABNORMAL LOW (ref 22–32)
Calcium: 9 mg/dL (ref 8.9–10.3)
Chloride: 104 mmol/L (ref 98–111)
Creatinine, Ser: 0.64 mg/dL (ref 0.44–1.00)
GFR calc Af Amer: >60 mL/min (ref >60)
GFR calc non Af Amer: >60 mL/min (ref >60)
Glucose, Bld: 108 mg/dL — ABNORMAL HIGH (ref 70–99)
Potassium: 3.6 mmol/L (ref 3.5–5.1)
Sodium: 137 mmol/L (ref 135–145)

## 2020-04-21 LAB — GLUCOSE, CAPILLARY
Glucose-Capillary: 107 mg/dL — ABNORMAL HIGH (ref 70–99)
Glucose-Capillary: 96 mg/dL (ref 70–99)
Glucose-Capillary: 112 mg/dL — ABNORMAL HIGH (ref 70–99)
Glucose-Capillary: 87 mg/dL (ref 70–99)

## 2020-04-21 LAB — URINALYSIS, COMPLETE (UACMP) WITH MICROSCOPIC
Bilirubin Urine: NEGATIVE
Glucose, UA: NEGATIVE mg/dL
Hgb urine dipstick: NEGATIVE
Ketones, ur: NEGATIVE mg/dL
Leukocytes,Ua: NEGATIVE
Nitrite: NEGATIVE
Protein, ur: NEGATIVE mg/dL
Specific Gravity, Urine: 1.004 — ABNORMAL LOW (ref 1.005–1.030)
pH: 7 (ref 5.0–8.0)

## 2020-04-21 MED ORDER — LEVOTHYROXINE SODIUM 100 MCG/5ML IV SOLN
62.5000 ug | Freq: Every day | INTRAVENOUS | Status: DC
  Administered 2020-04-21: 62.5 ug via INTRAVENOUS
  Filled 2020-04-21: qty 5

## 2020-04-21 MED ORDER — DIPHENHYDRAMINE HCL 50 MG/ML IJ SOLN
12.5000 mg | Freq: Once | INTRAMUSCULAR | Status: AC
  Administered 2020-04-21: 18:00:00 12.5 mg via INTRAVENOUS
  Filled 2020-04-21: qty 1

## 2020-04-21 MED ORDER — SODIUM CHLORIDE 0.9 % IV BOLUS
500.0000 mL | Freq: Once | INTRAVENOUS | Status: AC
  Administered 2020-04-21: 500 mL via INTRAVENOUS

## 2020-04-21 MED ORDER — PROCHLORPERAZINE EDISYLATE 10 MG/2ML IJ SOLN
5.0000 mg | Freq: Once | INTRAMUSCULAR | Status: AC
  Administered 2020-04-21: 11:00:00 5 mg via INTRAVENOUS
  Filled 2020-04-21: qty 2

## 2020-04-21 MED ORDER — METOCLOPRAMIDE HCL 5 MG/ML IJ SOLN
5.0000 mg | Freq: Three times a day (TID) | INTRAMUSCULAR | Status: DC
  Administered 2020-04-21 (×3): 5 mg via INTRAVENOUS
  Filled 2020-04-21 (×3): qty 2

## 2020-04-21 MED ORDER — POTASSIUM CHLORIDE 2 MEQ/ML IV SOLN
INTRAVENOUS | Status: DC
  Filled 2020-04-21 (×6): qty 1000

## 2020-04-21 MED ORDER — DIPHENHYDRAMINE HCL 50 MG/ML IJ SOLN
25.0000 mg | Freq: Once | INTRAMUSCULAR | Status: AC
  Administered 2020-04-21: 25 mg via INTRAVENOUS
  Filled 2020-04-21: qty 1

## 2020-04-21 MED ORDER — SODIUM CHLORIDE 0.9 % IV BOLUS
1000.0000 mL | Freq: Once | INTRAVENOUS | Status: AC
  Administered 2020-04-21: 1000 mL via INTRAVENOUS

## 2020-04-21 MED ORDER — PROCHLORPERAZINE EDISYLATE 10 MG/2ML IJ SOLN
5.0000 mg | Freq: Once | INTRAMUSCULAR | Status: AC
  Administered 2020-04-21: 5 mg via INTRAVENOUS
  Filled 2020-04-21: qty 2

## 2020-04-21 NOTE — Progress Notes (Signed)
Subjective: She is very frustrated with her stay.  States she has been unable to get any pain medications to help with her headache.  Feels her headache is contributing to a lot of her ongoing symptoms and thinks if this was under control, she would feel better.  Headache is 8/10 in severity.  Not sleeping well due to headache, nausea, abdominal discomfort.  Vomited twice in the last 24 hours. Heaving a lot. Nausea comes and goes. Epigastric pain is about the same as yesterday about 7/10. Had jello and a little tea and sprite last night.  Felt she did okay with that.  Couple of sips of water or shasta today.  Did not eat breakfast due to headache and nausea.  Asked if she has tried Tylenol for her headache.  States she cannot swallow this due to her nausea.  No BM today. Minimal flatus. GERD well controlled. Difficult to get patient to answer specific questions as she continues to talk about her headache and the need for pain medication.  Discussed GES but she would not be able to tolerate it at this point. Discussed trying short course of Reglan today and stopping phenergan for now to empirically treat for gastroparesis. Discussed possible side effects of Reglan. She was agreeable to Reglan trial.   Objective: Vital signs in last 24 hours: Temp:  [98.1 F (36.7 C)-99.6 F (37.6 C)] 99.6 F (37.6 C) (05/12 0014) Pulse Rate:  [58-78] 66 (05/12 0014) Resp:  [18-20] 20 (05/12 0014) BP: (101-161)/(59-89) 135/65 (05/12 0014) SpO2:  [94 %-99 %] 95 % (05/12 0014) Weight:  [63.5 kg] 63.5 kg (05/11 1314) Last BM Date: 04/18/20 General:   Alert and oriented, no acute distress Head:  Normocephalic and atraumatic. Abdomen:  Bowel sounds hypoactive, soft, mild generalized tenderness. No rebound or guarding. No masses appreciated   Extremities:  Without edema. Neurologic:  Alert and  oriented x4 Psych:  Alert and cooperative. Frustrated.   Intake/Output from previous day: 05/11 0701 - 05/12  0700 In: 157.4 [P.O.:120; I.V.:37.4] Out: -  Intake/Output this shift: No intake/output data recorded.  Lab Results: Recent Labs    04/19/20 1754 04/20/20 0456 04/21/20 0502  WBC 12.7* 12.9* 10.6*  HGB 14.6 13.7 13.3  HCT 44.2 42.9 41.3  PLT 370 305 273   BMET Recent Labs    04/19/20 1754 04/20/20 0456 04/21/20 0502  NA 139 142 137  K 3.4* 3.6 3.6  CL 99 104 104  CO2 25 26 21*  GLUCOSE 147* 141* 108*  BUN 7 9 9   CREATININE 0.59 0.63 0.64  CALCIUM 9.8 9.2 9.0   LFT Recent Labs    04/19/20 1754 04/20/20 0456  PROT 8.4* 8.0  ALBUMIN 4.6 4.3  AST 31 23  ALT 23 20  ALKPHOS 119 108  BILITOT 0.9 1.0    Studies/Results: DG Abdomen Acute W/Chest  Result Date: 04/20/2020 CLINICAL DATA:  Vomiting EXAM: DG ABDOMEN ACUTE W/ 1V CHEST COMPARISON:  Abdominal CT 03/17/2020 FINDINGS: Normal heart size and mediastinal contours. No acute infiltrate or edema. No effusion or pneumothorax. No acute osseous findings. The bowel gas pattern is nonobstructive. No abnormal stool retention. No concerning mass effect or gas collection. Cholecystectomy. Degenerative spurring at the right hip IMPRESSION: Normal bowel gas pattern. No evidence of acute cardiopulmonary disease. Electronically Signed   By: Monte Fantasia M.D.   On: 04/20/2020 04:56    Assessment: 54 y.o.year old femalewith medical history ofdiabetes mellitus type 2(diet-controlled), Hashimoto's thyroiditis resulting hypothyroidism, hyperlipidemia,  tobacco abuse, anxiety, and nausea with intermittent vomiting who presented with intractable nausea and vomiting that began 04/19/2020.  Associated epigastric pain that started after vomiting. Recent admission from 03/17/2020 to 03/19/2020 with similar symptoms. No acute findings on CT A/P w/o contrast in [place]. DG abdomen acute this admission within normal limits. BMP, LFTs, and lipase were unremarkable.  WBC 12.9 hemoglobin 13.7. TSH low 0.143. A1C 5.8.   Epigastric Pain/N/V: Several  year history of baseline nausea with intermittent vomiting.  Symptoms have been worse since the summer 2020 when she had Covid.  Associated early satiety with poor appetite.  Now with 2 hospitalizations for intractable nausea and vomiting.  GERD symptoms well controlled on Protonix 40 mg twice daily. Has been using zofran and phenergan at home. GES in 2016 normal. EGD 04/20/20 with patchy mildly erythematous mucosa without bleeding in the gastric antrum s/p biopsied, otherwise normal exam.  Biopsies were taken from duodenum to evaluate for celiac disease.  Her diet was advanced to clears after EGD. Nausea persists, vomiting x 2 in the last 24 hours but frequent heaving. Also with bad headache that she feels is contributing.  Abdominal exam with very mild generalized tenderness to palpation and somewhat hypoactive bowel sounds.   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 Ideally would pursue GES; however, due to ongoing nausea, she will not tolerate GES at this point, Headache is likely contributing. Suspect prior vomiting and heaving are contributing to her mild generalized TTP. Will plan for short course of empiric Reglan with meals and at bedtime today and reassess tomorrow. Reached out to Dr. Carles Collet for pain medications for headache to be ordered.   Plan: Continue clear liquid diet as tolerated for now Trial Reglan 5 mg with meals and at bedtime for 4 doses.  Continue Zofran scheduled Stop Phenergan for now (due to starting Reglan) Continue IV PPI BID as she is not tolerating p.o. intake very well.  Continue supportive measures Reassess tomorrow   LOS: 0 days    04/21/2020, 9:16 AM   Aliene Altes, PA-C [facility]

## 2020-04-21 NOTE — Progress Notes (Signed)
PROGRESS NOTE  Melanie Shepard [id number] DOB: [birth year] DOA: 04/20/2020 PCP: Moshe Cipro, MD  Brief History:   54 y.o. female with medical history of diabetes mellitus type 2, Hashimoto's thyroiditis resulting hypothyroidism, hyperlipidemia, tobacco abuse, anxiety presenting with recurrent nausea and vomiting that began in the morning of 04/19/2020.  The patient visited emergency department on 04/19/2020.  She was treated symptomatically and improved.  She was discharged home.  After discharge home, the patient began having vomiting once again, so she returned to the emergency department for further evaluation.  Patient denies any fever, chills, chest pain, shortness breath, headaches, dysuria, hematuria.  She complains of periumbilical and epigastric abdominal pain.  She continues to smoke 1 pack/day but denies any illicit drugs or marijuana.  She denies any sick contacts, travels, eating unusual or undercooked foods.  She lives with her spouse who is doing well without any nausea or vomiting.  Notably, the patient was diabetic with hemoglobin A1c up to 8.7 approximately 3 years prior to this admission.  She was on [place] 30 units daily at that time.  Since then, the patient has lost weight and has been able to discontinue her insulin.  She has not taken medicine for 2 years.  The patient had a recent admission to the hospital from 03/17/2020 to 03/19/2020 with similar symptoms.  EGD was offered at that time, but the patient wanted to go home.  She has not yet followed up to set up for EGD with [facility]. In the emergency department, the patient was afebrile hemodynamically stable with oxygen saturation 98% on room air.  BMP, LFTs, and lipase were unremarkable.  Lipase was 24.  WBC 12.9 hemoglobin 13.7 platelet platelets 305,000.  Acute abdominal series was negative.  Because of continued vomiting, the patient was admitted for further evaluation.  Assessment/Plan:  Intractable nausea and  vomiting -Suspect underlying gastroparesis in the setting of prior diabetes mellitus, now with impaired glucose tolerance -continue IV fluids -Around-the-clock Zofran -appreciate GI-->EGD -5/11 EGD--patchy erythema in gastric antrum -continue PPI -TSH--0.143 -A.m. cortisol--17.4 -UA and urine culture -Urine drug screen -still vomiting, intolerant to po intake  Tobacco abuse -Tobacco cessation discussed -NicoDerm patch  Impaired glucose tolerance -Patient was previously diabetic, now off of insulin x2 years -Previously on Tresiba 30 units daily and Metformin -03/18/2020 hemoglobin A1c 6.0 -04/20/20 A1C--5.8 -d/c ISS  Hashimoto's thyroiditis -TSH--0.143 -Continue levothyroxine--switch to IV until able to tolerate po  Anxiety -Continued home dose alprazolam -PMP Aware queried--pt receives monthly alprazolam 0.5 mg bid, #60       Disposition Plan: Patient From: Home D/C Place: Home - 2-3  Days Barriers: Not Clinically Stable--Not tolerating po intake; still vomiting  Family Communication:   No Family at bedside  Consultants:  GI  Code Status:  FULL  DVT Prophylaxis: [place] Lovenox   Procedures: As Listed in Progress Note Above  Antibiotics: None       Subjective: Pt vomited 2X last night.  Denies f/c, sob, dysuria, hematuria, diarrhea.  Complains of frontal headache-no focal deficits or visual disturbance  Objective: Vitals:   04/20/20 1600 04/20/20 1929 04/20/20 2044 04/21/20 0014  BP: 121/65  (!) 161/89 135/65  Pulse: (!) 59  64 66  Resp: 20  20 20   Temp: 98.2 F (36.8 C)  99.2 F (37.3 C) 99.6 F (37.6 C)  TempSrc: Oral  Oral Oral  SpO2: 94% 97% 98% 95%  Weight:      Height:  Intake/Output Summary (Last 24 hours) at 04/21/2020 1043 Last data filed at 04/20/2020 1641 Gross per 24 hour  Intake 157.43 ml  Output --  Net 157.43 ml   Weight change: 0 kg Exam:   General:  Pt is alert, follows commands appropriately, not in acute  distress  HEENT: No icterus, No thrush, No neck mass, [place]/AT  Cardiovascular: RRR, S1/S2, no rubs, no gallops  Respiratory: diminished BS, bibasilar rales. No weheeze  Abdomen: Soft/+BS, non tender, non distended, no guarding  Extremities: No edema, No lymphangitis, No petechiae, No rashes, no synovitis   Data Reviewed: I have personally reviewed following labs and imaging studies Basic Metabolic Panel: Recent Labs  Lab 04/19/20 1754 04/20/20 0456 04/21/20 0502  NA 139 142 137  K 3.4* 3.6 3.6  CL 99 104 104  CO2 25 26 21*  GLUCOSE 147* 141* 108*  BUN 7 9 9   CREATININE 0.59 0.63 0.64  CALCIUM 9.8 9.2 9.0   Liver Function Tests: Recent Labs  Lab 04/19/20 1754 04/20/20 0456  AST 31 23  ALT 23 20  ALKPHOS 119 108  BILITOT 0.9 1.0  PROT 8.4* 8.0  ALBUMIN 4.6 4.3   Recent Labs  Lab 04/19/20 1754 04/20/20 0456  LIPASE 24 24   No results for input(s): AMMONIA in the last 168 hours. Coagulation Profile: No results for input(s): INR, PROTIME in the last 168 hours. CBC: Recent Labs  Lab 04/19/20 1754 04/20/20 0456 04/21/20 0502  WBC 12.7* 12.9* 10.6*  NEUTROABS 8.9* 9.2*  --   HGB 14.6 13.7 13.3  HCT 44.2 42.9 41.3  MCV 98.7 101.2* 101.5*  PLT 370 305 273   Cardiac Enzymes: No results for input(s): CKTOTAL, CKMB, CKMBINDEX, TROPONINI in the last 168 hours. BNP: Invalid input(s): POCBNP CBG: Recent Labs  Lab 04/20/20 1200 04/20/20 1632 04/20/20 2021 04/21/20 0743  GLUCAP 103* 83 94 107*   HbA1C: Recent Labs    04/20/20 1206  HGBA1C 5.8*   Urine analysis:    Component Value Date/Time   COLORURINE YELLOW 03/17/2020 1933   APPEARANCEUR CLEAR 03/17/2020 1933   LABSPEC 1.017 03/17/2020 1933   PHURINE 7.0 03/17/2020 1933   GLUCOSEU NEGATIVE 03/17/2020 1933   HGBUR NEGATIVE 03/17/2020 1933   BILIRUBINUR NEGATIVE 03/17/2020 1933   BILIRUBINUR positive 09/09/2013 1513   [place] 03/17/2020 1933   PROTEINUR 30 (A) 03/17/2020 1933    UROBILINOGEN 0.2 09/11/2013 1946   NITRITE NEGATIVE 03/17/2020 1933   LEUKOCYTESUR NEGATIVE 03/17/2020 1933   Sepsis Labs: @LABRCNTIP (procalcitonin:4,lacticidven:4) ) Recent Results (from the past 240 hour(s))  SARS Coronavirus 2 by RT PCR (hospital order, performed in [facility] hospital lab) Nasopharyngeal Nasopharyngeal Swab     Status: None   Collection Time: 04/19/20 10:04 PM   Specimen: Nasopharyngeal Swab  Result Value Ref Range Status   SARS Coronavirus 2 NEGATIVE NEGATIVE Final    Comment: (NOTE) SARS-CoV-2 target nucleic acids are NOT DETECTED. The SARS-CoV-2 RNA is generally detectable in upper and lower respiratory specimens during the acute phase of infection. The lowest concentration of SARS-CoV-2 viral copies this assay can detect is 250 copies / mL. A negative result does not preclude SARS-CoV-2 infection and should not be used as the sole basis for treatment or other patient management decisions.  A negative result may occur with improper specimen collection / handling, submission of specimen other than nasopharyngeal swab, presence of viral mutation(s) within the areas targeted by this assay, and inadequate number of viral copies (<250 copies / mL). A  negative result must be combined with clinical observations, patient history, and epidemiological information. Fact Sheet for Patients:   StrictlyIdeas.no Fact Sheet for Healthcare Providers: BankingDealers.co.za This test is not yet approved or cleared  by the [place] FDA and has been authorized for detection and/or diagnosis of SARS-CoV-2 by FDA under an Emergency Use Authorization (EUA).  This EUA will remain in effect (meaning this test can be used) for the duration of the COVID-19 declaration under Section 564(b)(1) of the Act, 21 U.S.C. section 360bbb-3(b)(1), unless the authorization is terminated or revoked sooner. Performed at [facility], [address] [place], [place] [address]      Scheduled Meds: . diphenhydrAMINE  25 mg Intravenous Once  . enoxaparin (LOVENOX) injection  40 mg Subcutaneous Q24H  . feeding supplement  1 Container Oral TID BM  . insulin aspart  0-9 Units Subcutaneous TID WC  . levothyroxine  150 mcg Oral q morning - 10a  . nicotine  21 mg Transdermal Daily  . ondansetron (ZOFRAN) IV  4 mg Intravenous Q6H  . pantoprazole (PROTONIX) IV  40 mg Intravenous Q12H  . prochlorperazine  5 mg Intravenous Once   Continuous Infusions: . lactated ringers with kcl    . sodium chloride      Procedures/Studies: DG Abdomen Acute W/Chest  Result Date: 04/20/2020 CLINICAL DATA:  Vomiting EXAM: DG ABDOMEN ACUTE W/ 1V CHEST COMPARISON:  Abdominal CT 03/17/2020 FINDINGS: Normal heart size and mediastinal contours. No acute infiltrate or edema. No effusion or pneumothorax. No acute osseous findings. The bowel gas pattern is nonobstructive. No abnormal stool retention. No concerning mass effect or gas collection. Cholecystectomy. Degenerative spurring at the right hip IMPRESSION: Normal bowel gas pattern. No evidence of acute cardiopulmonary disease. Electronically Signed   By: Monte Fantasia M.D.   On: 04/20/2020 04:56    Orson Eva, DO  [facility]  If 7PM-7AM, please contact night-coverage [email or web address] Password [facility] 04/21/2020, 10:43 AM   LOS: 0 days

## 2020-04-22 DIAGNOSIS — R112 Nausea with vomiting, unspecified: Secondary | ICD-10-CM

## 2020-04-22 DIAGNOSIS — E7439 Other disorders of intestinal carbohydrate absorption: Secondary | ICD-10-CM

## 2020-04-22 DIAGNOSIS — F17200 Nicotine dependence, unspecified, uncomplicated: Secondary | ICD-10-CM

## 2020-04-22 LAB — CBC
HCT: 42.5 % (ref 36.0–46.0)
Hemoglobin: 13.5 g/dL (ref 12.0–15.0)
MCH: 31.5 pg (ref 26.0–34.0)
MCHC: 31.8 g/dL (ref 30.0–36.0)
MCV: 99.3 fL (ref 80.0–100.0)
Platelets: 243 10*3/uL (ref 150–400)
RBC: 4.28 MIL/uL (ref 3.87–5.11)
RDW: 12.9 % (ref 11.5–15.5)
WBC: 8.6 10*3/uL (ref 4.0–10.5)
nRBC: 0 % (ref 0.0–0.2)

## 2020-04-22 LAB — GLUCOSE, CAPILLARY: Glucose-Capillary: 92 mg/dL (ref 70–99)

## 2020-04-22 LAB — URINE CULTURE: Culture: <10000 — AB

## 2020-04-22 LAB — BASIC METABOLIC PANEL
Anion gap: 10 (ref 5–15)
BUN: 7 mg/dL (ref 6–20)
CO2: 23 mmol/L (ref 22–32)
Calcium: 9.1 mg/dL (ref 8.9–10.3)
Chloride: 108 mmol/L (ref 98–111)
Creatinine, Ser: 0.72 mg/dL (ref 0.44–1.00)
GFR calc Af Amer: >60 mL/min (ref >60)
GFR calc non Af Amer: >60 mL/min (ref >60)
Glucose, Bld: 105 mg/dL — ABNORMAL HIGH (ref 70–99)
Potassium: 3.3 mmol/L — ABNORMAL LOW (ref 3.5–5.1)
Sodium: 141 mmol/L (ref 135–145)

## 2020-04-22 LAB — MAGNESIUM: Magnesium: 1.9 mg/dL (ref 1.7–2.4)

## 2020-04-22 MED ORDER — LEVOTHYROXINE SODIUM 25 MCG PO TABS
125.0000 ug | ORAL_TABLET | Freq: Every day | ORAL | Status: DC
  Administered 2020-04-22: 125 ug via ORAL
  Filled 2020-04-22: qty 1

## 2020-04-22 MED ORDER — PANTOPRAZOLE SODIUM 40 MG PO TBEC
40.0000 mg | DELAYED_RELEASE_TABLET | Freq: Two times a day (BID) | ORAL | Status: DC
  Administered 2020-04-22: 40 mg via ORAL
  Filled 2020-04-22: qty 1

## 2020-04-22 MED ORDER — METOCLOPRAMIDE HCL 10 MG PO TABS
5.0000 mg | ORAL_TABLET | Freq: Three times a day (TID) | ORAL | Status: DC
  Administered 2020-04-22: 5 mg via ORAL
  Filled 2020-04-22: qty 1

## 2020-04-22 NOTE — Progress Notes (Signed)
0945: pt c/o headache, agreeable to take oral Tylenol for pain rated 10/10 with her regular am meds. Pt then began to heave and vomited clear liquid into the floor. Pt given trash can and continued to heave and vomit, but no pills or pill fragments noted in vomitus. MD Dyann Kief notified of pt's c/o and current vomiting. Made pt aware that she has zofran ordered for n/v, but because she has refused to allow [place] to restart her IV, her only option at this time is oral. Pt agreeable to oral zofran, asked to wait about 10 - 15 minutes, "To let my stomach settle." Agreeable.  1003: In to room and administered oral Zofran for nausea. Pt states, 'I'm gonna leave this place today. Yall nurses are fine but these doctor's ain't doin' nothin' for me. I think I'm gonna go to another hospital or something." Advised pt that I have both messaged and personally spoken with MD Dyann Kief this am regarding her c/o pain, her n/v and her desire to be discharged. She stated understanding.  1030: Received call from secretary that pt's husband came to desk and wished to speak to me. I finished with the patient I was currently administering medications to and went to pt's room (330). Pt, nor her husband, were noted to be in room. Spoke with Network engineer, she states that she saw pt and her husband walk past nurse's desk and towards elevator at approximately 1027. It appears pt has left AMA. MD [facility] notified.

## 2020-04-23 NOTE — Discharge Summary (Signed)
Physician Discharge Summary  Melanie Shepard BY:[id number] DOB: [birth year] DOA: 04/20/2020  PCP: Moshe Cipro, MD  Admit date: 04/20/2020 Discharge date: 04/23/2020  Time spent: 25 minutes  Recommendations for Outpatient Follow-up:  Unable to provide any recommendations of patient left AGAINST MEDICAL ADVICE.  Discharge Diagnoses:  Active Problems:   Intractable nausea and vomiting   Smoking   Intractable vomiting   Discharge Condition: Improved.  Patient left AGAINST MEDICAL ADVICE.  CODE STATUS: Full code.  Filed Weights   04/20/20 0324 04/20/20 1314  Weight: 63.5 kg 63.5 kg    History of present illness:  As per H&P written by Dr. Shanon Brow Shepard on 04/20/2020 54 y.o. female with medical history of diabetes mellitus type 2, Hashimoto's thyroiditis resulting hypothyroidism, hyperlipidemia, tobacco abuse, anxiety presenting with recurrent nausea and vomiting that began in the morning of 04/19/2020.  The patient visited emergency department on 04/19/2020.  She was treated symptomatically and improved.  She was discharged home.  After discharge home, the patient began having vomiting once again, so she returned to the emergency department for further evaluation.  Patient denies any fever, chills, chest pain, shortness breath, headaches, dysuria, hematuria.  She complains of periumbilical and epigastric abdominal pain.  She continues to smoke 1 pack/day but denies any illicit drugs or marijuana.  She denies any sick contacts, travels, eating unusual or undercooked foods.  She lives with her spouse who is doing well without any nausea or vomiting.  Notably, the patient was diabetic with hemoglobin A1c up to 8.7 approximately 3 years prior to this admission.  She was on [place] 30 units daily at that time.  Since then, the patient has lost weight and has been able to discontinue her insulin.  She has not taken medicine for 2 years.  The patient had a recent admission to the hospital from 03/17/2020 to  03/19/2020 with similar symptoms.  EGD was offered at that time, but the patient wanted to go home.  She has not yet followed up to set up for EGD with [facility]. In the emergency department, the patient was afebrile hemodynamically stable with oxygen saturation 98% on room air.  BMP, LFTs, and lipase were unremarkable.  Lipase was 24.  WBC 12.9 hemoglobin 13.7 platelet platelets 305,000.  Acute abdominal series was negative.  Because of continued vomiting, the patient was admitted for further evaluation.  Hospital Course:  Intractable nausea and vomiting -Suspect underlying gastroparesis in the setting of prior diabetes mellitus, now withimpaired glucose tolerance -Patient condition improve after IV fluids and antiemetics -She felt so much better and left AGAINST MEDICAL ADVICE before further having her diet advanced or having any further medications adjustment. -appreciate GI assistance and recommendation-->EGD -5/11 EGD--patchy erythema in gastric antrum -Planning to continue PPI -TSH--0.143 -A.m. cortisol--17.4  Tobacco abuse -Tobacco cessation discussed -NicoDerm patch use while inpatient.  Impaired glucose tolerance -Patient was previously diabetic, now off of insulin x2 years -Previously on Tresiba 30 units daily and Metformin -03/18/2020 hemoglobin A1c 6.0 -04/20/20 A1C--5.8 -Continue modified carbohydrate diet.  Hashimoto's thyroiditis -TSH--0.143 -Planning to continue levothyroxine  Anxiety -Continued home dose alprazolam -PMP Aware queried--pt receives monthly alprazolam 0.5 mg bid, #60  Procedures: See below for x-ray reports EGD  Consultations:  Gastroenterology service  Discharge Exam: Vitals:   04/21/20 2059 04/22/20 0542  BP: (!) 119/58 130/63  Pulse: 64 62  Resp: 16 19  Temp: 98.9 F (37.2 C) (!) 97.3 F (36.3 C)  SpO2: 95% 99%    General: Patient apparently with  significant improvement after IV fluids and antiemetics.  Decided to leave  [place] before being seen by myself.  She even left the floor without communicating with any nursing staff member.  Discharge Instructions    Allergies as of 04/22/2020      Reactions   Cymbalta [duloxetine Hcl] Other (See Comments)   Makes my head do crazy things   Ivp Dye [iodinated Diagnostic Agents] Swelling   Facial swelling, pt tried premedication previously still had a reaction.    Trazodone And Nefazodone    Gives nightmares   Adhesive [tape] Swelling, Rash   Tolerates paper tape      Medication List    ASK your doctor about these medications   ALPRAZolam 0.5 MG tablet Commonly known as: XANAX Take 0.5 mg by mouth in the morning and at bedtime.   calcium carbonate 500 MG chewable tablet Commonly known as: TUMS - dosed in mg elemental calcium Chew 2 tablets by mouth daily as needed for indigestion or heartburn.   MELATONIN GUMMIES PO Take 3-4 capsules by mouth at bedtime.   ondansetron 4 MG tablet Commonly known as: ZOFRAN Take 1 tablet (4 mg total) by mouth every 6 (six) hours as needed for nausea or vomiting.   pantoprazole 40 MG tablet Commonly known as: PROTONIX TAKE 1 TABLET(40 MG) BY MOUTH TWICE DAILY BEFORE A MEAL   promethazine 25 MG tablet Commonly known as: PHENERGAN Take 0.5-1 tablets (12.5-25 mg total) by mouth every 6 (six) hours as needed for nausea or vomiting.   Synthroid 150 MCG tablet Generic drug: levothyroxine Take 150 mcg by mouth every morning.   Vitamin D (Ergocalciferol) 1.25 MG (50000 UNIT) Caps capsule Commonly known as: DRISDOL Take 50,000 Units by mouth 2 (two) times a week.      Allergies  Allergen Reactions  . Cymbalta [Duloxetine Hcl] Other (See Comments)    Makes my head do crazy things  . Ivp Dye [Iodinated Diagnostic Agents] Swelling    Facial swelling, pt tried premedication previously still had a reaction.   . Trazodone And Nefazodone     Gives nightmares  . Adhesive [Tape] Swelling and Rash     Tolerates paper tape    The results of significant diagnostics from this hospitalization (including imaging, microbiology, ancillary and laboratory) are listed below for reference.    Significant Diagnostic Studies: DG Abdomen Acute W/Chest  Result Date: 04/20/2020 CLINICAL DATA:  Vomiting EXAM: DG ABDOMEN ACUTE W/ 1V CHEST COMPARISON:  Abdominal CT 03/17/2020 FINDINGS: Normal heart size and mediastinal contours. No acute infiltrate or edema. No effusion or pneumothorax. No acute osseous findings. The bowel gas pattern is nonobstructive. No abnormal stool retention. No concerning mass effect or gas collection. Cholecystectomy. Degenerative spurring at the right hip IMPRESSION: Normal bowel gas pattern. No evidence of acute cardiopulmonary disease. Electronically Signed   By: Monte Fantasia M.D.   On: 04/20/2020 04:56    Microbiology: Recent Results (from the past 240 hour(s))  SARS Coronavirus 2 by RT PCR (hospital order, performed in [facility] hospital lab) Nasopharyngeal Nasopharyngeal Swab     Status: None   Collection Time: 04/19/20 10:04 PM   Specimen: Nasopharyngeal Swab  Result Value Ref Range Status   SARS Coronavirus 2 NEGATIVE NEGATIVE Final    Comment: (NOTE) SARS-CoV-2 target nucleic acids are NOT DETECTED. The SARS-CoV-2 RNA is generally detectable in upper and lower respiratory specimens during the acute phase of infection. The lowest concentration of SARS-CoV-2 viral copies this assay can detect  is 250 copies / mL. A negative result does not preclude SARS-CoV-2 infection and should not be used as the sole basis for treatment or other patient management decisions.  A negative result may occur with improper specimen collection / handling, submission of specimen other than nasopharyngeal swab, presence of viral mutation(s) within the areas targeted by this assay, and inadequate number of viral copies (<250 copies / mL). A negative result must be combined with  clinical observations, patient history, and epidemiological information. Fact Sheet for Patients:   StrictlyIdeas.no Fact Sheet for Healthcare Providers: BankingDealers.co.za This test is not yet approved or cleared  by the [place] FDA and has been authorized for detection and/or diagnosis of SARS-CoV-2 by FDA under an Emergency Use Authorization (EUA).  This EUA will remain in effect (meaning this test can be used) for the duration of the COVID-19 declaration under Section 564(b)(1) of the Act, 21 U.S.C. section 360bbb-3(b)(1), unless the authorization is terminated or revoked sooner. Performed at [facility], [address] [place], [place] [address]   Culture, Urine     Status: Abnormal   Collection Time: 04/20/20  2:10 PM   Specimen: Urine, Clean Catch  Result Value Ref Range Status   Specimen Description   Final    URINE, CLEAN CATCH Performed at [facility], [address] [place], [place] [address]    Special Requests   Final    NONE Performed at [facility], [address] [place], [place] [address]    Culture (A)  Final    <10,000 COLONIES/mL INSIGNIFICANT GROWTH Performed at [facility] [address], [place], [place] [address]    Report Status 04/22/2020 FINAL  Final     Labs: Basic Metabolic Panel: Recent Labs  Lab 04/19/20 1754 04/20/20 0456 04/21/20 0502 04/22/20 0538  NA 139 142 137 141  K 3.4* 3.6 3.6 3.3*  CL 99 104 104 108  CO2 25 26 21* 23  GLUCOSE 147* 141* 108* 105*  BUN 7 9 9 7   CREATININE 0.59 0.63 0.64 0.72  CALCIUM 9.8 9.2 9.0 9.1  MG  --   --   --  1.9   Liver Function Tests: Recent Labs  Lab 04/19/20 1754 04/20/20 0456  AST 31 23  ALT 23 20  ALKPHOS 119 108  BILITOT 0.9 1.0  PROT 8.4* 8.0  ALBUMIN 4.6 4.3   Recent Labs  Lab 04/19/20 1754 04/20/20 0456  LIPASE 24 24   CBC: Recent Labs  Lab 04/19/20 1754 04/20/20 0456 04/21/20 0502 04/22/20 0538  WBC  12.7* 12.9* 10.6* 8.6  NEUTROABS 8.9* 9.2*  --   --   HGB 14.6 13.7 13.3 13.5  HCT 44.2 42.9 41.3 42.5  MCV 98.7 101.2* 101.5* 99.3  PLT 370 305 273 243   CBG: Recent Labs  Lab 04/21/20 0743 04/21/20 1135 04/21/20 1625 04/21/20 2056 04/22/20 0836  GLUCAP 107* 96 112* 87 92   Signed:  Barton Dubois MD.  [facility] 04/23/2020, 5:43 PM

## 2020-08-11 ENCOUNTER — Other Ambulatory Visit: Payer: Self-pay | Admitting: Gastroenterology

## 2022-03-08 DIAGNOSIS — F331 Major depressive disorder, recurrent, moderate: Secondary | ICD-10-CM | POA: Insufficient documentation

## 2022-03-08 DIAGNOSIS — G4700 Insomnia, unspecified: Secondary | ICD-10-CM | POA: Insufficient documentation

## 2022-03-08 DIAGNOSIS — Z0000 Encounter for general adult medical examination without abnormal findings: Secondary | ICD-10-CM | POA: Insufficient documentation

## 2022-03-08 DIAGNOSIS — Z6821 Body mass index (BMI) 21.0-21.9, adult: Secondary | ICD-10-CM | POA: Insufficient documentation

## 2022-04-12 DIAGNOSIS — F17200 Nicotine dependence, unspecified, uncomplicated: Secondary | ICD-10-CM | POA: Insufficient documentation

## 2022-04-12 DIAGNOSIS — J479 Bronchiectasis, uncomplicated: Secondary | ICD-10-CM | POA: Insufficient documentation

## 2022-05-10 DIAGNOSIS — J4540 Moderate persistent asthma, uncomplicated: Secondary | ICD-10-CM | POA: Insufficient documentation

## 2022-08-16 ENCOUNTER — Encounter (HOSPITAL_COMMUNITY): Payer: Self-pay

## 2022-08-16 ENCOUNTER — Inpatient Hospital Stay (HOSPITAL_COMMUNITY)
Admission: EM | Admit: 2022-08-16 | Discharge: 2022-08-18 | DRG: 690 | Disposition: A | Discharge status: Home / Self Care | Payer: Medicare (Managed Care) | Attending: Family Medicine | Admitting: Family Medicine

## 2022-08-16 ENCOUNTER — Emergency Department (HOSPITAL_COMMUNITY): Payer: Medicare (Managed Care)

## 2022-08-16 ENCOUNTER — Other Ambulatory Visit: Payer: Self-pay

## 2022-08-16 DIAGNOSIS — E876 Hypokalemia: Secondary | ICD-10-CM

## 2022-08-16 DIAGNOSIS — G40909 Epilepsy, unspecified, not intractable, without status epilepticus: Secondary | ICD-10-CM | POA: Diagnosis present

## 2022-08-16 DIAGNOSIS — E119 Type 2 diabetes mellitus without complications: Secondary | ICD-10-CM | POA: Diagnosis not present

## 2022-08-16 DIAGNOSIS — T3695XA Adverse effect of unspecified systemic antibiotic, initial encounter: Secondary | ICD-10-CM | POA: Diagnosis present

## 2022-08-16 DIAGNOSIS — E860 Dehydration: Secondary | ICD-10-CM | POA: Diagnosis not present

## 2022-08-16 DIAGNOSIS — Z79890 Hormone replacement therapy (postmenopausal): Secondary | ICD-10-CM

## 2022-08-16 DIAGNOSIS — F419 Anxiety disorder, unspecified: Secondary | ICD-10-CM | POA: Diagnosis present

## 2022-08-16 DIAGNOSIS — Z888 Allergy status to other drugs, medicaments and biological substances status: Secondary | ICD-10-CM

## 2022-08-16 DIAGNOSIS — Z79899 Other long term (current) drug therapy: Secondary | ICD-10-CM

## 2022-08-16 DIAGNOSIS — R1115 Cyclical vomiting syndrome unrelated to migraine: Secondary | ICD-10-CM | POA: Diagnosis present

## 2022-08-16 DIAGNOSIS — R1110 Vomiting, unspecified: Secondary | ICD-10-CM | POA: Diagnosis not present

## 2022-08-16 DIAGNOSIS — I2510 Atherosclerotic heart disease of native coronary artery without angina pectoris: Secondary | ICD-10-CM | POA: Diagnosis present

## 2022-08-16 DIAGNOSIS — Z91048 Other nonmedicinal substance allergy status: Secondary | ICD-10-CM

## 2022-08-16 DIAGNOSIS — Z8249 Family history of ischemic heart disease and other diseases of the circulatory system: Secondary | ICD-10-CM

## 2022-08-16 DIAGNOSIS — N12 Tubulo-interstitial nephritis, not specified as acute or chronic: Secondary | ICD-10-CM

## 2022-08-16 DIAGNOSIS — N3000 Acute cystitis without hematuria: Secondary | ICD-10-CM

## 2022-08-16 DIAGNOSIS — Z91041 Radiographic dye allergy status: Secondary | ICD-10-CM

## 2022-08-16 DIAGNOSIS — K760 Fatty (change of) liver, not elsewhere classified: Secondary | ICD-10-CM | POA: Insufficient documentation

## 2022-08-16 DIAGNOSIS — E785 Hyperlipidemia, unspecified: Secondary | ICD-10-CM | POA: Diagnosis present

## 2022-08-16 DIAGNOSIS — E1143 Type 2 diabetes mellitus with diabetic autonomic (poly)neuropathy: Secondary | ICD-10-CM | POA: Diagnosis present

## 2022-08-16 DIAGNOSIS — R160 Hepatomegaly, not elsewhere classified: Secondary | ICD-10-CM | POA: Diagnosis present

## 2022-08-16 DIAGNOSIS — J449 Chronic obstructive pulmonary disease, unspecified: Secondary | ICD-10-CM | POA: Diagnosis present

## 2022-08-16 DIAGNOSIS — K219 Gastro-esophageal reflux disease without esophagitis: Secondary | ICD-10-CM | POA: Diagnosis present

## 2022-08-16 DIAGNOSIS — R112 Nausea with vomiting, unspecified: Secondary | ICD-10-CM | POA: Diagnosis present

## 2022-08-16 DIAGNOSIS — N390 Urinary tract infection, site not specified: Secondary | ICD-10-CM | POA: Diagnosis not present

## 2022-08-16 DIAGNOSIS — F17210 Nicotine dependence, cigarettes, uncomplicated: Secondary | ICD-10-CM | POA: Diagnosis present

## 2022-08-16 DIAGNOSIS — R162 Hepatomegaly with splenomegaly, not elsewhere classified: Secondary | ICD-10-CM | POA: Diagnosis present

## 2022-08-16 DIAGNOSIS — Z7951 Long term (current) use of inhaled steroids: Secondary | ICD-10-CM

## 2022-08-16 LAB — CBC WITH DIFFERENTIAL/PLATELET
Abs Immature Granulocytes: 0.05 10*3/uL (ref 0.00–0.07)
Basophils Absolute: 0.1 10*3/uL (ref 0.0–0.1)
Basophils Relative: 1 %
Eosinophils Absolute: 0.2 10*3/uL (ref 0.0–0.5)
Eosinophils Relative: 1 %
HCT: 48.5 % — ABNORMAL HIGH (ref 36.0–46.0)
Hemoglobin: 16.6 g/dL — ABNORMAL HIGH (ref 12.0–15.0)
Immature Granulocytes: 0 %
Lymphocytes Relative: 28 %
Lymphs Abs: 3.7 10*3/uL (ref 0.7–4.0)
MCH: 35.2 pg — ABNORMAL HIGH (ref 26.0–34.0)
MCHC: 34.2 g/dL (ref 30.0–36.0)
MCV: 102.8 fL — ABNORMAL HIGH (ref 80.0–100.0)
Monocytes Absolute: 0.4 10*3/uL (ref 0.1–1.0)
Monocytes Relative: 3 %
Neutro Abs: 8.6 10*3/uL — ABNORMAL HIGH (ref 1.7–7.7)
Neutrophils Relative %: 67 %
Platelets: 160 10*3/uL (ref 150–400)
RBC: 4.72 MIL/uL (ref 3.87–5.11)
RDW: 13.8 % (ref 11.5–15.5)
WBC: 13 10*3/uL — ABNORMAL HIGH (ref 4.0–10.5)
nRBC: 0 % (ref 0.0–0.2)

## 2022-08-16 LAB — COMPREHENSIVE METABOLIC PANEL
ALT: 31 U/L (ref 0–44)
AST: 48 U/L — ABNORMAL HIGH (ref 15–41)
Albumin: 5.4 g/dL — ABNORMAL HIGH (ref 3.5–5.0)
Alkaline Phosphatase: 144 U/L — ABNORMAL HIGH (ref 38–126)
Anion gap: 15 (ref 5–15)
BUN: 13 mg/dL (ref 6–20)
CO2: 25 mmol/L (ref 22–32)
Calcium: 10.2 mg/dL (ref 8.9–10.3)
Chloride: 95 mmol/L — ABNORMAL LOW (ref 98–111)
Creatinine, Ser: 0.86 mg/dL (ref 0.44–1.00)
GFR, Estimated: >60 mL/min (ref >60)
Glucose, Bld: 166 mg/dL — ABNORMAL HIGH (ref 70–99)
Potassium: 3.4 mmol/L — ABNORMAL LOW (ref 3.5–5.1)
Sodium: 135 mmol/L (ref 135–145)
Total Bilirubin: 1.6 mg/dL — ABNORMAL HIGH (ref 0.3–1.2)
Total Protein: 9.5 g/dL — ABNORMAL HIGH (ref 6.5–8.1)

## 2022-08-16 LAB — TROPONIN I (HIGH SENSITIVITY)
Troponin I (High Sensitivity): 3 ng/L (ref <18)
Troponin I (High Sensitivity): 3 ng/L (ref <18)

## 2022-08-16 LAB — URINALYSIS, ROUTINE W REFLEX MICROSCOPIC
Bilirubin Urine: NEGATIVE
Glucose, UA: NEGATIVE mg/dL
Hgb urine dipstick: NEGATIVE
Ketones, ur: 5 mg/dL — AB
Nitrite: NEGATIVE
Protein, ur: 30 mg/dL — AB
Specific Gravity, Urine: 1.019 (ref 1.005–1.030)
pH: 7 (ref 5.0–8.0)

## 2022-08-16 LAB — POC URINE PREG, ED: Preg Test, Ur: NEGATIVE

## 2022-08-16 LAB — LIPASE, BLOOD: Lipase: 35 U/L (ref 11–51)

## 2022-08-16 LAB — MAGNESIUM: Magnesium: 2.3 mg/dL (ref 1.7–2.4)

## 2022-08-16 MED ORDER — SODIUM CHLORIDE 0.9 % IV SOLN
1.0000 g | INTRAVENOUS | Status: DC
  Administered 2022-08-17: 1 g via INTRAVENOUS
  Filled 2022-08-16: qty 10

## 2022-08-16 MED ORDER — METOCLOPRAMIDE HCL 5 MG/ML IJ SOLN
10.0000 mg | Freq: Once | INTRAMUSCULAR | Status: AC
  Administered 2022-08-16: 10 mg via INTRAVENOUS
  Filled 2022-08-16: qty 2

## 2022-08-16 MED ORDER — ACETAMINOPHEN 650 MG RE SUPP: 650.0000 mg | Freq: Four times a day (QID) | RECTAL | Status: DC | PRN

## 2022-08-16 MED ORDER — ENOXAPARIN SODIUM 40 MG/0.4ML IJ SOSY
40.0000 mg | PREFILLED_SYRINGE | INTRAMUSCULAR | Status: DC
  Administered 2022-08-16 – 2022-08-17 (×2): 40 mg via SUBCUTANEOUS
  Filled 2022-08-16 (×2): qty 0.4

## 2022-08-16 MED ORDER — LORAZEPAM 0.5 MG PO TABS
0.5000 mg | ORAL_TABLET | Freq: Once | ORAL | Status: AC
Start: 2022-08-16 — End: 2022-08-16
  Administered 2022-08-16: 0.5 mg via ORAL
  Filled 2022-08-16: qty 1

## 2022-08-16 MED ORDER — SODIUM CHLORIDE 0.9 % IV SOLN
12.5000 mg | INTRAVENOUS | Status: DC | PRN
  Administered 2022-08-16 – 2022-08-18 (×9): 12.5 mg via INTRAVENOUS
  Filled 2022-08-16 (×5): qty 0.5

## 2022-08-16 MED ORDER — SODIUM CHLORIDE 0.9 % IV SOLN
1.0000 g | Freq: Once | INTRAVENOUS | Status: AC
  Administered 2022-08-16: 1 g via INTRAVENOUS

## 2022-08-16 MED ORDER — MORPHINE SULFATE (PF) 2 MG/ML IV SOLN
2.0000 mg | INTRAVENOUS | Status: DC | PRN
  Administered 2022-08-16 – 2022-08-18 (×9): 2 mg via INTRAVENOUS
  Filled 2022-08-16 (×9): qty 1

## 2022-08-16 MED ORDER — LIDOCAINE VISCOUS HCL 2 % MT SOLN
15.0000 mL | Freq: Once | OROMUCOSAL | Status: AC
  Administered 2022-08-16: 15 mL via ORAL
  Filled 2022-08-16: qty 15

## 2022-08-16 MED ORDER — POTASSIUM CHLORIDE IN NACL 20-0.9 MEQ/L-% IV SOLN: INTRAVENOUS | Status: AC

## 2022-08-16 MED ORDER — PROMETHAZINE HCL 25 MG/ML IJ SOLN
INTRAMUSCULAR | Status: AC
  Filled 2022-08-16: qty 1

## 2022-08-16 MED ORDER — ONDANSETRON HCL 4 MG/2ML IJ SOLN
4.0000 mg | Freq: Once | INTRAMUSCULAR | Status: AC
  Administered 2022-08-16: 4 mg via INTRAVENOUS
  Filled 2022-08-16: qty 2

## 2022-08-16 MED ORDER — MOMETASONE FURO-FORMOTEROL FUM 200-5 MCG/ACT IN AERO
2.0000 | INHALATION_SPRAY | Freq: Two times a day (BID) | RESPIRATORY_TRACT | Status: DC
  Administered 2022-08-17 – 2022-08-18 (×2): 2 via RESPIRATORY_TRACT
  Filled 2022-08-16: qty 8.8

## 2022-08-16 MED ORDER — ALUM & MAG HYDROXIDE-SIMETH 200-200-20 MG/5ML PO SUSP
30.0000 mL | Freq: Once | ORAL | Status: AC
  Administered 2022-08-16: 30 mL via ORAL
  Filled 2022-08-16: qty 30

## 2022-08-16 MED ORDER — LACTATED RINGERS IV BOLUS
2000.0000 mL | Freq: Once | INTRAVENOUS | Status: AC
  Administered 2022-08-16: 2000 mL via INTRAVENOUS

## 2022-08-16 MED ORDER — LEVOTHYROXINE SODIUM 137 MCG PO TABS
137.0000 ug | ORAL_TABLET | Freq: Every day | ORAL | Status: DC
  Administered 2022-08-17 – 2022-08-18 (×2): 137 ug via ORAL
  Filled 2022-08-16 (×2): qty 1

## 2022-08-16 MED ORDER — FENTANYL CITRATE PF 50 MCG/ML IJ SOSY
50.0000 ug | PREFILLED_SYRINGE | Freq: Once | INTRAMUSCULAR | Status: AC
  Administered 2022-08-16: 50 ug via INTRAVENOUS
  Filled 2022-08-16: qty 1

## 2022-08-16 MED ORDER — PANTOPRAZOLE SODIUM 40 MG PO TBEC
40.0000 mg | DELAYED_RELEASE_TABLET | Freq: Two times a day (BID) | ORAL | Status: DC
  Administered 2022-08-16 – 2022-08-18 (×4): 40 mg via ORAL
  Filled 2022-08-16 (×4): qty 1

## 2022-08-16 MED ORDER — POLYETHYLENE GLYCOL 3350 17 G PO PACK
17.0000 g | PACK | Freq: Every day | ORAL | Status: DC | PRN
  Filled 2022-08-16: qty 1

## 2022-08-16 MED ORDER — SODIUM CHLORIDE 0.9 % IV SOLN: INTRAVENOUS | Status: DC

## 2022-08-16 MED ORDER — FAMOTIDINE IN NACL 20-0.9 MG/50ML-% IV SOLN
20.0000 mg | Freq: Once | INTRAVENOUS | Status: AC
  Administered 2022-08-16: 20 mg via INTRAVENOUS
  Filled 2022-08-16: qty 50

## 2022-08-16 MED ORDER — ACETAMINOPHEN 325 MG PO TABS: 650.0000 mg | ORAL_TABLET | Freq: Four times a day (QID) | ORAL | Status: DC | PRN

## 2022-08-16 NOTE — Assessment & Plan Note (Signed)
Slight liver enzymes elevation, AST 48, ALP 144.   - CT abdomen and pelvis - hepatomegaly with severe hepatic steatosis and mild splenomegaly. Correlate with any signs of hepatic dysfunction. - lipid panel in the morning -Would likely benefit from statins.

## 2022-08-16 NOTE — Assessment & Plan Note (Signed)
Symptoms of dysuria.  Rules out for sepsis.  WBC 13.  Afebrile.  UA with moderate leukocytes.  CT abdomen negative for pyelonephritis. -IV ceftriaxone 1 g daily -Follow-up urine cultures

## 2022-08-16 NOTE — Assessment & Plan Note (Addendum)
Intractable vomiting in the setting of UTI.  Also diabetic.  CT -without acute abnormality, shows severe hepatic steatosis and mild splenomegaly.  Status postcholecystectomy. -As needed Phenergan -2 L bolus given, continue N/s + 20 kcl 100cc/hr x 15hrs -Bowel rest with clear liquid diet.

## 2022-08-16 NOTE — ED Notes (Signed)
Patient transported to CT 

## 2022-08-16 NOTE — ED Provider Notes (Signed)
Patient continues to have nausea and vomiting, thankfully tachycardia has resolved.  Discussed with hospitalist who has been kind enough to admit   Melanie Chapel, MD 08/16/22 1747

## 2022-08-16 NOTE — ED Notes (Signed)
AC called at this time for phenergan medication. AC states she would get the medicine to this nurse shortly.

## 2022-08-16 NOTE — ED Triage Notes (Signed)
Pt to er with husband, husband states that pt would like him to talk for her. States that she is here for vomiting. States that they went to the er in [place] and was dx with a uti and given an abx and some nausea medication. States that since then she has been vomiting and can't keep anything down so she hasn't taken her medications.

## 2022-08-16 NOTE — Assessment & Plan Note (Signed)
Potassium 3.4. -Replete 

## 2022-08-16 NOTE — ED Notes (Signed)
No vomiting since PO challenge, however patient states she feels nauseous.

## 2022-08-16 NOTE — Assessment & Plan Note (Addendum)
Not on medication. -CBG every 12 hourly - HgbA1c

## 2022-08-16 NOTE — ED Provider Notes (Signed)
[facility] EMERGENCY DEPARTMENT Provider Note   CSN: [id number] Arrival date & time: 08/16/22  1230     History  Chief Complaint  Patient presents with   Emesis    Melanie Shepard is a 56 y.o. female.   Emesis Associated symptoms: abdominal pain   Patient presents for nausea, vomiting, and p.o. intolerance.  Medical history includes anxiety, COPD, hypothyroidism, seizure, GERD, dysphagia, CAD, DM.  On Sunday, 3 days ago, she developed symptoms of nausea and vomiting.  She was seen in the [facility] emergency department.  Work-up there was notable for UTI.  She was prescribed antibiotics and antiemetics.  After her ED visit, she had recurrence of nausea and vomiting and this has been persistent since that time.  She has not been able to take her medications.  She has had intolerance of any food or drink.  Currently, patient endorses chest pain, shortness of breath, epigastric pain, and continued nausea.  She describes the emesis as green or black in color at times.  Her last bowel movement was yesterday.  She was not aware of the color.     Home Medications Prior to Admission medications   Medication Sig Start Date End Date Taking? Authorizing Provider  ALPRAZolam Duanne Moron) 0.5 MG tablet Take 0.5 mg by mouth in the morning and at bedtime.  11/03/19   [provider]  calcium carbonate (TUMS - DOSED IN MG ELEMENTAL CALCIUM) 500 MG chewable tablet Chew 2 tablets by mouth daily as needed for indigestion or heartburn.    [provider]  MELATONIN GUMMIES PO Take 3-4 capsules by mouth at bedtime.    [provider]  ondansetron (ZOFRAN) 4 MG tablet TAKE 1 TABLET(4 MG) BY MOUTH EVERY 8 HOURS AS NEEDED FOR NAUSEA OR VOMITING 08/12/20   Mahala Menghini, PA-C  pantoprazole (PROTONIX) 40 MG tablet TAKE 1 TABLET(40 MG) BY MOUTH TWICE DAILY BEFORE A MEAL Patient taking differently: Take 40 mg by mouth 2 (two) times daily.  07/27/17   Mahala Menghini, PA-C  promethazine  (PHENERGAN) 25 MG tablet Take 0.5-1 tablets (12.5-25 mg total) by mouth every 6 (six) hours as needed for nausea or vomiting. 04/19/20   Hayden Rasmussen, MD  SYNTHROID 150 MCG tablet Take 150 mcg by mouth every morning. 01/16/20   [provider]  Vitamin D, Ergocalciferol, (DRISDOL) 50000 units CAPS capsule Take 50,000 Units by mouth 2 (two) times a week.  11/29/17   [provider]      Allergies    Cymbalta [duloxetine hcl], Ivp dye [iodinated contrast media], Trazodone and nefazodone, and Adhesive [tape]    Review of Systems   Review of Systems  Constitutional:  Positive for fatigue.  Respiratory:  Positive for shortness of breath.   Cardiovascular:  Positive for chest pain.  Gastrointestinal:  Positive for abdominal pain, nausea and vomiting.  Neurological:  Positive for weakness (Generalized).    Physical Exam Updated Vital Signs BP (!) 144/85   Pulse 72   Temp 98 F (36.7 C) (Oral)   Resp 18   Ht 5' 7"  (1.702 m)   Wt 61.2 kg   SpO2 95%   BMI 21.14 kg/m  Physical Exam Vitals and nursing note reviewed.  Constitutional:      General: She is not in acute distress.    Appearance: Normal appearance. She is well-developed. She is ill-appearing. She is not toxic-appearing or diaphoretic.  HENT:     Head: Normocephalic and atraumatic.     Right  Ear: External ear normal.     Left Ear: External ear normal.     Nose: Nose normal.     Mouth/Throat:     Mouth: Mucous membranes are moist.  Eyes:     Extraocular Movements: Extraocular movements intact.     Conjunctiva/sclera: Conjunctivae normal.  Cardiovascular:     Rate and Rhythm: Normal rate and regular rhythm.     Heart sounds: No murmur heard. Pulmonary:     Effort: Pulmonary effort is normal. No respiratory distress.     Breath sounds: Normal breath sounds. No wheezing or rales.  Chest:     Chest wall: No tenderness.  Abdominal:     Palpations: Abdomen is soft.     Tenderness: There is abdominal  tenderness. There is no guarding or rebound.  Musculoskeletal:        General: No swelling. Normal range of motion.     Cervical back: Normal range of motion and neck supple.     Right lower leg: No edema.     Left lower leg: No edema.  Skin:    General: Skin is warm and dry.     Coloration: Skin is not jaundiced or pale.  Neurological:     General: No focal deficit present.     Mental Status: She is alert and oriented to person, place, and time.     Cranial Nerves: No cranial nerve deficit.     Sensory: No sensory deficit.     Motor: No weakness.     Coordination: Coordination normal.  Psychiatric:        Mood and Affect: Mood is anxious. Affect is tearful.        Speech: Speech normal.        Behavior: Behavior normal. Behavior is cooperative.     ED Results / Procedures / Treatments   Labs (all labs ordered are listed, but only abnormal results are displayed) Labs Reviewed  COMPREHENSIVE METABOLIC PANEL - Abnormal; Notable for the following components:      Result Value   Potassium 3.4 (*)    Chloride 95 (*)    Glucose, Bld 166 (*)    Total Protein 9.5 (*)    Albumin 5.4 (*)    AST 48 (*)    Alkaline Phosphatase 144 (*)    Total Bilirubin 1.6 (*)    All other components within normal limits  CBC WITH DIFFERENTIAL/PLATELET - Abnormal; Notable for the following components:   WBC 13.0 (*)    Hemoglobin 16.6 (*)    HCT 48.5 (*)    MCV 102.8 (*)    MCH 35.2 (*)    Neutro Abs 8.6 (*)    All other components within normal limits  URINALYSIS, ROUTINE W REFLEX MICROSCOPIC - Abnormal; Notable for the following components:   APPearance HAZY (*)    Ketones, ur 5 (*)    Protein, ur 30 (*)    Leukocytes,Ua MODERATE (*)    Bacteria, UA RARE (*)    Non Squamous Epithelial 0-5 (*)    All other components within normal limits  URINE CULTURE  LIPASE, BLOOD  MAGNESIUM  POC URINE PREG, ED  TROPONIN I (HIGH SENSITIVITY)  TROPONIN I (HIGH SENSITIVITY)    EKG EKG  Interpretation  Date/Time:  Wednesday August 16 2022 13:27:38 EDT Ventricular Rate:  79 PR Interval:  167 QRS Duration: 95 QT Interval:  424 QTC Calculation: 487 R Axis:   78 Text Interpretation: Sinus rhythm Consider RVH or posterior infarct Borderline  T abnormalities, anterior leads Borderline prolonged QT interval Confirmed by Godfrey Pick [phone]) on 08/16/2022 2:04:47 PM  Radiology CT ABDOMEN PELVIS WO CONTRAST  Result Date: 08/16/2022 CLINICAL DATA:  56 year old female presents for evaluation of nausea vomiting and abdominal pain with recent diagnosis of UTI treated with antibiotics. EXAM: CT ABDOMEN AND PELVIS WITHOUT CONTRAST TECHNIQUE: Multidetector CT imaging of the abdomen and pelvis was performed following the standard protocol without IV contrast. RADIATION DOSE REDUCTION: This exam was performed according to the departmental dose-optimization program which includes automated exposure control, adjustment of the mA and/or kV according to patient size and/or use of iterative reconstruction technique. COMPARISON:  [facility] 7, 2021 FINDINGS: Lower chest: Basilar atelectasis.  No effusion.  No consolidation. Hepatobiliary: Severe hepatic steatosis which has developed since previous imaging. Vascular structures in the liver are more dense than liver parenchyma. Mild fissural widening and lobular hepatic contours. Post cholecystectomy. No gross biliary duct distension. 19 cm greatest craniocaudal hepatic span. Pancreas: Normal pancreatic contour without signs of adjacent stranding. Spleen: Mild splenomegaly. Adrenals/Urinary Tract: Adrenal glands are normal. Smooth renal contours. No overt perinephric stranding. No signs of hydronephrosis. No nephrolithiasis. No renal or ureteral calculi. Urinary bladder is collapsed without adjacent stranding. Stomach/Bowel: Appendix not visualized, no secondary signs to suggest acute appendiceal process or acute process adjacent to the cecum. Submucosal fat deposition  in the area of the cecum. No signs of pericolonic stranding or overt colonic thickening, under distension does limit assessment. Stomach under distended.  No sign of small bowel obstruction. Vascular/Lymphatic: Calcified atheromatous plaque of the abdominal aorta extending into iliac vessels. No adenopathy in the abdomen. No adenopathy in the pelvis. Reproductive: Post hysterectomy, unremarkable by CT. Other: No ascites.  No pneumoperitoneum. Musculoskeletal: No acute bone finding. No destructive bone process. Spinal degenerative changes. IMPRESSION: 1. No acute findings in the abdomen or pelvis. 2. Hepatomegaly with severe hepatic steatosis and mild splenomegaly. Correlate with any signs of hepatic dysfunction. 3. Post cholecystectomy and hysterectomy. 4. Aortic atherosclerosis. Aortic Atherosclerosis (ICD10-I70.0). Electronically Signed   By: Zetta Bills M.D.   On: 08/16/2022 14:50   DG Chest Portable 1 View  Result Date: 08/16/2022 CLINICAL DATA:  Emesis cough EXAM: PORTABLE CHEST 1 VIEW COMPARISON:  04/20/2020 FINDINGS: The heart size and mediastinal contours are within normal limits. Both lungs are clear. The visualized skeletal structures are unremarkable. IMPRESSION: No acute abnormality of the lungs in AP portable projection. Electronically Signed   By: Delanna Ahmadi M.D.   On: 08/16/2022 14:19    Procedures Procedures    Medications Ordered in ED Medications  cefTRIAXone (ROCEPHIN) 1 g in sodium chloride 0.9 % 100 mL IVPB (has no administration in time range)  metoCLOPramide (REGLAN) injection 10 mg (10 mg Intravenous Given 08/16/22 1357)  fentaNYL (SUBLIMAZE) injection 50 mcg (50 mcg Intravenous Given 08/16/22 1358)  lactated ringers bolus 2,000 mL (2,000 mLs Intravenous New Bag/Given 08/16/22 1356)  alum & mag hydroxide-simeth (MAALOX/MYLANTA) 200-200-20 MG/5ML suspension 30 mL (30 mLs Oral Given 08/16/22 1359)    And  lidocaine (XYLOCAINE) 2 % viscous mouth solution 15 mL (15 mLs Oral Given  08/16/22 1359)  famotidine (PEPCID) IVPB 20 mg premix (0 mg Intravenous Stopped 08/16/22 1438)  fentaNYL (SUBLIMAZE) injection 50 mcg (50 mcg Intravenous Given 08/16/22 1559)  ondansetron (ZOFRAN) injection 4 mg (4 mg Intravenous Given 08/16/22 1559)    ED Course/ Medical Decision Making/ A&P  Medical Decision Making Amount and/or Complexity of Data Reviewed Labs: ordered. Radiology: ordered.  Risk OTC drugs. Prescription drug management.   This patient presents to the ED for concern of nausea, vomiting, and p.o. intolerance, this involves an extensive number of treatment options, and is a complaint that carries with it a high risk of complications and morbidity.  The differential diagnosis includes pyelonephritis, dehydration, SBO, enteritis, gastritis, GERD, ACS   Co morbidities that complicate the patient evaluation  anxiety, COPD, hypothyroidism, seizure, GERD, dysphagia, CAD, DM   Additional history obtained:  Additional history obtained from patient's husband External records from outside source obtained and reviewed including EMR   Lab Tests:  I Ordered, and personally interpreted labs.  The pertinent results include: Mild hypokalemia with otherwise normal electrolytes, mild elevation in alk phos and bilirubin, normal transaminases.  Elevation in WBC and hemoglobin consistent with hemoconcentration.  Macrocytosis is baseline.  Troponins are normal.  Urinalysis is equivocal.   Imaging Studies ordered:  I ordered imaging studies including chest x-ray, CT of abdomen and pelvis I independently visualized and interpreted imaging which showed hepatomegaly, hepatic steatosis, and splenomegaly.  No acute findings. I agree with the radiologist interpretation   Cardiac Monitoring: / EKG:  The patient was maintained on a cardiac monitor.  I personally viewed and interpreted the cardiac monitored which showed an underlying rhythm of: Sinus rhythm  Problem  List / ED Course / Critical interventions / Medication management  Patient is a 56 year old female presenting for nausea, vomiting, and p.o. intolerance for the past 3 days.  She was diagnosed with UTI 3 days ago and has had p.o. intolerance since that time.  She has not been able to take her antibiotics that were prescribed.  Antiemetics at home have not been tolerated.  On arrival in the ED, patient has normal vital signs.  She is endorsing epigastric pain, chest pain, shortness of breath, and continued nausea.  Given her p.o. intolerance, IV fluids were ordered.  Patient was ordered Reglan for symptomatic relief of nausea.  Fentanyl was given for analgesia.  Broad diagnostic work-up was given due to wide differential.  Patient was given GI cocktail for empiric treatment of GERD/gastritis component to her symptoms.  CBC shows elevated WBC and hemoglobin consistent with hemoconcentration.  Additional liter of IV fluids was ordered.  Patient was also found to have mild elevation in alkaline phosphatase and bilirubin.  She is status postcholecystectomy.  On CT imaging, she did have normal caliber of biliary ducts.  She was found to have hepatomegaly and hepatic steatosis.  Patient denies alcohol use.  Urinalysis is equivocal.  Patient was given empiric treatment for UTI with ceftriaxone.  On reassessment, she did endorse some improved symptoms, however, she had mild continued pain and nausea.  Zofran and an additional dose of fentanyl were ordered.  Patient to undergo p.o. challenge.  Care of patient was signed out to oncoming ED provider. I ordered medication including IV fluids for hydration; fentanyl for analgesia; Reglan and Zofran for nausea; GI cocktail for epigastric pain; ceftriaxone for empiric treatment of UTI Reevaluation of the patient after these medicines showed that the patient improved I have reviewed the patients home medicines and have made adjustments as needed   Social Determinants of  Health:  Lives at home with family         Final Clinical Impression(s) / ED Diagnoses Final diagnoses:  Dehydration  Nausea and vomiting, unspecified vomiting type    Rx / DC Orders ED  Discharge Orders     None         Godfrey Pick, MD 08/16/22 1626

## 2022-08-16 NOTE — ED Notes (Signed)
2 IV attempts by this nurse without success. Wille Glaser, RN to try with [place].
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
## 2022-08-17 DIAGNOSIS — Z91048 Other nonmedicinal substance allergy status: Secondary | ICD-10-CM | POA: Diagnosis not present

## 2022-08-17 DIAGNOSIS — Z79899 Other long term (current) drug therapy: Secondary | ICD-10-CM | POA: Diagnosis not present

## 2022-08-17 DIAGNOSIS — J449 Chronic obstructive pulmonary disease, unspecified: Secondary | ICD-10-CM | POA: Diagnosis present

## 2022-08-17 DIAGNOSIS — Z888 Allergy status to other drugs, medicaments and biological substances status: Secondary | ICD-10-CM | POA: Diagnosis not present

## 2022-08-17 DIAGNOSIS — R1115 Cyclical vomiting syndrome unrelated to migraine: Secondary | ICD-10-CM | POA: Diagnosis present

## 2022-08-17 DIAGNOSIS — K219 Gastro-esophageal reflux disease without esophagitis: Secondary | ICD-10-CM | POA: Diagnosis present

## 2022-08-17 DIAGNOSIS — Z91041 Radiographic dye allergy status: Secondary | ICD-10-CM | POA: Diagnosis not present

## 2022-08-17 DIAGNOSIS — E785 Hyperlipidemia, unspecified: Secondary | ICD-10-CM | POA: Diagnosis present

## 2022-08-17 DIAGNOSIS — E876 Hypokalemia: Secondary | ICD-10-CM | POA: Diagnosis present

## 2022-08-17 DIAGNOSIS — R112 Nausea with vomiting, unspecified: Secondary | ICD-10-CM | POA: Diagnosis present

## 2022-08-17 DIAGNOSIS — R162 Hepatomegaly with splenomegaly, not elsewhere classified: Secondary | ICD-10-CM | POA: Diagnosis present

## 2022-08-17 DIAGNOSIS — K760 Fatty (change of) liver, not elsewhere classified: Secondary | ICD-10-CM | POA: Diagnosis present

## 2022-08-17 DIAGNOSIS — Z7951 Long term (current) use of inhaled steroids: Secondary | ICD-10-CM | POA: Diagnosis not present

## 2022-08-17 DIAGNOSIS — T3695XA Adverse effect of unspecified systemic antibiotic, initial encounter: Secondary | ICD-10-CM | POA: Diagnosis present

## 2022-08-17 DIAGNOSIS — Z8249 Family history of ischemic heart disease and other diseases of the circulatory system: Secondary | ICD-10-CM | POA: Diagnosis not present

## 2022-08-17 DIAGNOSIS — G40909 Epilepsy, unspecified, not intractable, without status epilepticus: Secondary | ICD-10-CM | POA: Diagnosis present

## 2022-08-17 DIAGNOSIS — Z79890 Hormone replacement therapy (postmenopausal): Secondary | ICD-10-CM | POA: Diagnosis not present

## 2022-08-17 DIAGNOSIS — I2510 Atherosclerotic heart disease of native coronary artery without angina pectoris: Secondary | ICD-10-CM | POA: Diagnosis present

## 2022-08-17 DIAGNOSIS — F419 Anxiety disorder, unspecified: Secondary | ICD-10-CM | POA: Diagnosis present

## 2022-08-17 DIAGNOSIS — N390 Urinary tract infection, site not specified: Secondary | ICD-10-CM | POA: Diagnosis present

## 2022-08-17 DIAGNOSIS — R1110 Vomiting, unspecified: Secondary | ICD-10-CM | POA: Diagnosis not present

## 2022-08-17 DIAGNOSIS — E860 Dehydration: Secondary | ICD-10-CM | POA: Diagnosis present

## 2022-08-17 DIAGNOSIS — E1143 Type 2 diabetes mellitus with diabetic autonomic (poly)neuropathy: Secondary | ICD-10-CM | POA: Diagnosis present

## 2022-08-17 DIAGNOSIS — F17210 Nicotine dependence, cigarettes, uncomplicated: Secondary | ICD-10-CM | POA: Diagnosis present

## 2022-08-17 LAB — BASIC METABOLIC PANEL
Anion gap: 7 (ref 5–15)
BUN: 8 mg/dL (ref 6–20)
CO2: 25 mmol/L (ref 22–32)
Calcium: 8.5 mg/dL — ABNORMAL LOW (ref 8.9–10.3)
Chloride: 106 mmol/L (ref 98–111)
Creatinine, Ser: 0.7 mg/dL (ref 0.44–1.00)
GFR, Estimated: >60 mL/min (ref >60)
Glucose, Bld: 108 mg/dL — ABNORMAL HIGH (ref 70–99)
Potassium: 3 mmol/L — ABNORMAL LOW (ref 3.5–5.1)
Sodium: 138 mmol/L (ref 135–145)

## 2022-08-17 LAB — CBC
HCT: 36.4 % (ref 36.0–46.0)
Hemoglobin: 12.5 g/dL (ref 12.0–15.0)
MCH: 35.7 pg — ABNORMAL HIGH (ref 26.0–34.0)
MCHC: 34.3 g/dL (ref 30.0–36.0)
MCV: 104 fL — ABNORMAL HIGH (ref 80.0–100.0)
Platelets: 222 10*3/uL (ref 150–400)
RBC: 3.5 MIL/uL — ABNORMAL LOW (ref 3.87–5.11)
RDW: 13.5 % (ref 11.5–15.5)
WBC: 9.5 10*3/uL (ref 4.0–10.5)
nRBC: 0 % (ref 0.0–0.2)

## 2022-08-17 LAB — LIPID PANEL
Cholesterol: 183 mg/dL (ref 0–200)
HDL: 18 mg/dL — ABNORMAL LOW (ref >40)
LDL Cholesterol: 110 mg/dL — ABNORMAL HIGH (ref 0–99)
Total CHOL/HDL Ratio: 10.2 RATIO
Triglycerides: 274 mg/dL — ABNORMAL HIGH (ref <150)
VLDL: 55 mg/dL — ABNORMAL HIGH (ref 0–40)

## 2022-08-17 LAB — GLUCOSE, CAPILLARY
Glucose-Capillary: 136 mg/dL — ABNORMAL HIGH (ref 70–99)
Glucose-Capillary: 113 mg/dL — ABNORMAL HIGH (ref 70–99)

## 2022-08-17 LAB — HIV ANTIBODY (ROUTINE TESTING W REFLEX): HIV Screen 4th Generation wRfx: NONREACTIVE

## 2022-08-17 LAB — URINE CULTURE: Culture: <10000 — AB

## 2022-08-17 LAB — RAPID URINE DRUG SCREEN, HOSP PERFORMED
Amphetamines: NOT DETECTED
Barbiturates: NOT DETECTED
Benzodiazepines: POSITIVE — AB
Cocaine: NOT DETECTED
Opiates: POSITIVE — AB
Tetrahydrocannabinol: NOT DETECTED

## 2022-08-17 LAB — HEMOGLOBIN A1C
Hgb A1c MFr Bld: 7 % — ABNORMAL HIGH (ref 4.8–5.6)
Mean Plasma Glucose: 154.2 mg/dL

## 2022-08-17 MED ORDER — PROMETHAZINE HCL 25 MG/ML IJ SOLN
INTRAMUSCULAR | Status: AC
  Filled 2022-08-17: qty 1

## 2022-08-17 MED ORDER — LORAZEPAM 0.5 MG PO TABS
0.5000 mg | ORAL_TABLET | Freq: Once | ORAL | Status: AC
  Administered 2022-08-17: 0.5 mg via ORAL
  Filled 2022-08-17: qty 1

## 2022-08-17 MED ORDER — PROCHLORPERAZINE 25 MG RE SUPP
25.0000 mg | Freq: Once | RECTAL | Status: DC
  Filled 2022-08-17: qty 1

## 2022-08-17 MED ORDER — SODIUM CHLORIDE 0.9 % IV SOLN: INTRAVENOUS | Status: DC

## 2022-08-17 MED ORDER — POTASSIUM CHLORIDE CRYS ER 20 MEQ PO TBCR
40.0000 meq | EXTENDED_RELEASE_TABLET | ORAL | Status: AC
  Administered 2022-08-17 (×2): 40 meq via ORAL
  Filled 2022-08-17 (×2): qty 2

## 2022-08-17 NOTE — Progress Notes (Signed)
PROGRESS NOTE     Melanie Shepard, is a 56 y.o. female, DOB - [birth year], INO:[id number]  Admit date - 08/16/2022   Admitting Physician Iris Hairston Denton Brick, MD  Outpatient Primary MD for the patient is Moshe Cipro, MD  LOS - 0  Chief Complaint  Patient presents with   Emesis         -Assessment and Plan: * Intractable vomiting -She is status postcholecystectomy. -CT abdomen and pelvis without acute findings to explain her persistent vomiting -Lipase is not elevated ??  UTI -Patient reports history of cyclical vomiting even without UTI in the past ??  Gastroparesis -UDS without illicit drugs specifically no THC -Emesis has improved but some nausea persist limiting oral intake -Continue IV fluids and antiemetics as needed  Possible UTI (urinary tract infection) -Leukocytosis has resolved with hydration -Continue IV Rocephin pending culture data  Hypokalemia -Placed on recheck  DM type 2 (diabetes mellitus, type 2) (HCC)  -A1c 7.0 reflecting fair diabetic control PTA Use Novolog/Humalog Sliding scale insulin with Accu-Cheks/Fingersticks as ordered   Hepatomegaly/hepatic steatosis - CT abdomen and pelvis - hepatomegaly with severe hepatic steatosis and mild splenomegaly. -Lipid profile noted -Elevated LFTs noted -Patient is status post prior cholecystectomy -Outpatient follow-up with GI advised - Disposition/Need for in-Hospital Stay- patient unable to be discharged at this time due to --possible UTI requiring IV antibiotics, intractable emesis requiring IV fluids and antiemetics pending better tolerance of oral intake*  Status is: Inpatient   Disposition: The patient is from: Home              Anticipated d/c is to: Home              Anticipated d/c date is: 1 day              Patient currently is not medically stable to d/c. Barriers: Not Clinically Stable-   Code Status :  -  Code Status: Full Code   Family Communication:    NA (patient is alert, awake and  coherent)   DVT Prophylaxis  :   - SCDs   enoxaparin (LOVENOX) injection 40 mg Start: 08/16/22 2230   Lab Results  Component Value Date   PLT 222 08/17/2022    Inpatient Medications  Scheduled Meds:  enoxaparin (LOVENOX) injection  40 mg Subcutaneous Q24H   levothyroxine  137 mcg Oral Q0600   mometasone-formoterol  2 puff Inhalation BID   pantoprazole  40 mg Oral BID   prochlorperazine  25 mg Rectal Once   Continuous Infusions:  sodium chloride     cefTRIAXone (ROCEPHIN)  IV 1 g (08/17/22 1507)   promethazine (PHENERGAN) injection (IM or IVPB) 12.5 mg (08/17/22 1653)   PRN Meds:.acetaminophen **OR** acetaminophen, morphine injection, polyethylene glycol, promethazine (PHENERGAN) injection (IM or IVPB)   Anti-infectives (From admission, onward)    Start     Dose/Rate Route Frequency Ordered Stop   08/17/22 1600  cefTRIAXone (ROCEPHIN) 1 g in sodium chloride 0.9 % 100 mL IVPB        1 g 200 mL/hr over 30 Minutes Intravenous Every 24 hours 08/16/22 2152     08/16/22 1630  cefTRIAXone (ROCEPHIN) 1 g in sodium chloride 0.9 % 100 mL IVPB        1 g 200 mL/hr over 30 Minutes Intravenous  Once 08/16/22 1618 08/16/22 1739         Subjective: Harshita Montesdeoca today has no fevers, no further emesis,  No chest pain,   - Nausea persist limiting  the oral intake Denies dysuria  Objective: Vitals:   08/16/22 2030 08/16/22 2139 08/17/22 0212 08/17/22 1457  BP: (!) 144/88 (!) 149/62 129/72 100/60  Pulse: 65 69 69 68  Resp:    18  Temp:  99.2 F (37.3 C) 98.3 F (36.8 C) 98.3 F (36.8 C)  TempSrc:  Oral Oral   SpO2: 96% 94% 95% 95%  Weight:      Height:        Intake/Output Summary (Last 24 hours) at 08/17/2022 1846 Last data filed at 08/17/2022 1507 Gross per 24 hour  Intake 1875.7 ml  Output --  Net 1875.7 ml   Filed Weights   08/16/22 1256  Weight: 61.2 kg    Physical Exam  Gen:- Awake Alert, in no acute distress HEENT:- [place].AT, No sclera icterus Neck-Supple  Neck,No JVD,.  Lungs-  CTAB , fair symmetrical air movement CV- S1, S2 normal, regular  Abd-  +ve B.Sounds, Abd Soft, general abdominal discomfort on palpation without rebound or guarding , no CVA area tenderness extremity/Skin:- No  edema, pedal pulses present  Psych-affect is appropriate, oriented x3 Neuro-no new focal deficits, no tremors  Data Reviewed: I have personally reviewed following labs and imaging studies  CBC: Recent Labs  Lab 08/16/22 1342 08/17/22 0757  WBC 13.0* 9.5  NEUTROABS 8.6*  --   HGB 16.6* 12.5  HCT 48.5* 36.4  MCV 102.8* 104.0*  PLT 160 400   Basic Metabolic Panel: Recent Labs  Lab 08/16/22 1342 08/17/22 0437  NA 135 138  K 3.4* 3.0*  CL 95* 106  CO2 25 25  GLUCOSE 166* 108*  BUN 13 8  CREATININE 0.86 0.70  CALCIUM 10.2 8.5*  MG 2.3  --    GFR: Estimated Creatinine Clearance: 75.9 mL/min (by C-G formula based on SCr of 0.7 mg/dL). Liver Function Tests: Recent Labs  Lab 08/16/22 1342  AST 48*  ALT 31  ALKPHOS 144*  BILITOT 1.6*  PROT 9.5*  ALBUMIN 5.4*   Cardiac Enzymes: No results for input(s): "CKTOTAL", "CKMB", "CKMBINDEX", "TROPONINI" in the last 168 hours. BNP (last 3 results) No results for input(s): "PROBNP" in the last 8760 hours. HbA1C: Recent Labs    08/17/22 0437  HGBA1C 7.0*   Sepsis Labs: '@LABRCNTIP'$ (procalcitonin:4,lacticidven:4) ) Recent Results (from the past 240 hour(s))  Urine Culture     Status: Abnormal   Collection Time: 08/16/22  3:26 PM   Specimen: Urine, Clean Catch  Result Value Ref Range Status   Specimen Description   Final    URINE, CLEAN CATCH Performed at [facility], [address], [place], [place] [address]    Special Requests   Final    NONE Performed at [facility], [address], [place], [place] [address]    Culture (A)  Final    <10,000 COLONIES/mL INSIGNIFICANT GROWTH Performed at [facility], [place] [address], [place], [place] [address]    Report Status 08/17/2022  FINAL  Final      Radiology Studies: CT ABDOMEN PELVIS WO CONTRAST  Result Date: 08/16/2022 CLINICAL DATA:  56 year old female presents for evaluation of nausea vomiting and abdominal pain with recent diagnosis of UTI treated with antibiotics. EXAM: CT ABDOMEN AND PELVIS WITHOUT CONTRAST TECHNIQUE: Multidetector CT imaging of the abdomen and pelvis was performed following the standard protocol without IV contrast. RADIATION DOSE REDUCTION: This exam was performed according to the departmental dose-optimization program which includes automated exposure control, adjustment of the mA and/or kV according to patient size and/or  use of iterative reconstruction technique. COMPARISON:  [place] 7, 2021 FINDINGS: Lower chest: Basilar atelectasis.  No effusion.  No consolidation. Hepatobiliary: Severe hepatic steatosis which has developed since previous imaging. Vascular structures in the liver are more dense than liver parenchyma. Mild fissural widening and lobular hepatic contours. Post cholecystectomy. No gross biliary duct distension. 19 cm greatest craniocaudal hepatic span. Pancreas: Normal pancreatic contour without signs of adjacent stranding. Spleen: Mild splenomegaly. Adrenals/Urinary Tract: Adrenal glands are normal. Smooth renal contours. No overt perinephric stranding. No signs of hydronephrosis. No nephrolithiasis. No renal or ureteral calculi. Urinary bladder is collapsed without adjacent stranding. Stomach/Bowel: Appendix not visualized, no secondary signs to suggest acute appendiceal process or acute process adjacent to the cecum. Submucosal fat deposition in the area of the cecum. No signs of pericolonic stranding or overt colonic thickening, under distension does limit assessment. Stomach under distended.  No sign of small bowel obstruction. Vascular/Lymphatic: Calcified atheromatous plaque of the abdominal aorta extending into iliac vessels. No adenopathy in the abdomen. No adenopathy in the pelvis.  Reproductive: Post hysterectomy, unremarkable by CT. Other: No ascites.  No pneumoperitoneum. Musculoskeletal: No acute bone finding. No destructive bone process. Spinal degenerative changes. IMPRESSION: 1. No acute findings in the abdomen or pelvis. 2. Hepatomegaly with severe hepatic steatosis and mild splenomegaly. Correlate with any signs of hepatic dysfunction. 3. Post cholecystectomy and hysterectomy. 4. Aortic atherosclerosis. Aortic Atherosclerosis (ICD10-I70.0). Electronically Signed   By: Zetta Bills M.D.   On: 08/16/2022 14:50   DG Chest Portable 1 View  Result Date: 08/16/2022 CLINICAL DATA:  Emesis cough EXAM: PORTABLE CHEST 1 VIEW COMPARISON:  04/20/2020 FINDINGS: The heart size and mediastinal contours are within normal limits. Both lungs are clear. The visualized skeletal structures are unremarkable. IMPRESSION: No acute abnormality of the lungs in [facility] portable projection. Electronically Signed   By: Delanna Ahmadi M.D.   On: 08/16/2022 14:19     Scheduled Meds:  enoxaparin (LOVENOX) injection  40 mg Subcutaneous Q24H   levothyroxine  137 mcg Oral Q0600   mometasone-formoterol  2 puff Inhalation BID   pantoprazole  40 mg Oral BID   prochlorperazine  25 mg Rectal Once   Continuous Infusions:  sodium chloride     cefTRIAXone (ROCEPHIN)  IV 1 g (08/17/22 1507)   promethazine (PHENERGAN) injection (IM or IVPB) 12.5 mg (08/17/22 1653)     LOS: 0 days    Roxan Hockey M.D on 08/17/2022 at 6:46 PM  Go to [email or web address] - for contact info  [facility] - Office  [phone]  If 7PM-7AM, please contact night-coverage [email or web address] 08/17/2022, 6:46 PM

## 2022-08-17 NOTE — TOC Progression Note (Signed)
  Transition of Care [facility]) Screening Note   Patient Details  Name: Melisia Horst Date of Birth: 26-Dec-1965   Transition of Care [facility]) CM/SW Contact:    Boneta Lucks, RN Phone Number: 08/17/2022, 10:46 AM    Transition of Care Department [facility]) has reviewed patient and no TOC needs have been identified at this time. We will continue to monitor patient advancement through interdisciplinary progression rounds. If new patient transition needs arise, please place a TOC consult.     Barriers to Discharge: Continued Medical Work up  Expected Discharge Plan and Services        Living arrangements for the past 2 months: [place]

## 2022-08-18 DIAGNOSIS — R1110 Vomiting, unspecified: Secondary | ICD-10-CM | POA: Diagnosis not present

## 2022-08-18 LAB — GLUCOSE, CAPILLARY
Glucose-Capillary: 110 mg/dL — ABNORMAL HIGH (ref 70–99)
Glucose-Capillary: 114 mg/dL — ABNORMAL HIGH (ref 70–99)

## 2022-08-18 MED ORDER — PROMETHAZINE HCL 25 MG/ML IJ SOLN
INTRAMUSCULAR | Status: AC
  Filled 2022-08-18: qty 1
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
## 2022-08-18 NOTE — Discharge Instructions (Signed)
1)Avoid ibuprofen/Advil/Aleve/Motrin/Goody Powders/Naproxen/BC powders/Meloxicam/Diclofenac/Indomethacin and other Nonsteroidal anti-inflammatory medications as these will make you more likely to bleed and can cause stomach ulcers, can also cause Kidney problems.   2) follow-up with primary care physician within a week for repeat CBC and BMP blood tests  3) please continue to drink lots of fluids and avoid dehydration

## 2022-10-12 ENCOUNTER — Encounter (HOSPITAL_COMMUNITY): Payer: Self-pay | Admitting: Emergency Medicine

## 2022-10-12 ENCOUNTER — Inpatient Hospital Stay (HOSPITAL_COMMUNITY)
Admission: EM | Admit: 2022-10-12 | Discharge: 2022-10-14 | DRG: 392 | Disposition: A | Discharge status: Home / Self Care | Payer: Medicare (Managed Care) | Attending: Internal Medicine | Admitting: Internal Medicine

## 2022-10-12 ENCOUNTER — Other Ambulatory Visit: Payer: Self-pay

## 2022-10-12 ENCOUNTER — Emergency Department (HOSPITAL_COMMUNITY): Payer: Medicare (Managed Care)

## 2022-10-12 DIAGNOSIS — E7800 Pure hypercholesterolemia, unspecified: Secondary | ICD-10-CM | POA: Diagnosis present

## 2022-10-12 DIAGNOSIS — Z7951 Long term (current) use of inhaled steroids: Secondary | ICD-10-CM

## 2022-10-12 DIAGNOSIS — K760 Fatty (change of) liver, not elsewhere classified: Secondary | ICD-10-CM | POA: Diagnosis present

## 2022-10-12 DIAGNOSIS — Z716 Tobacco abuse counseling: Secondary | ICD-10-CM

## 2022-10-12 DIAGNOSIS — J449 Chronic obstructive pulmonary disease, unspecified: Secondary | ICD-10-CM | POA: Diagnosis not present

## 2022-10-12 DIAGNOSIS — K2950 Unspecified chronic gastritis without bleeding: Secondary | ICD-10-CM | POA: Diagnosis present

## 2022-10-12 DIAGNOSIS — E119 Type 2 diabetes mellitus without complications: Secondary | ICD-10-CM

## 2022-10-12 DIAGNOSIS — Z79899 Other long term (current) drug therapy: Secondary | ICD-10-CM

## 2022-10-12 DIAGNOSIS — R7989 Other specified abnormal findings of blood chemistry: Secondary | ICD-10-CM | POA: Diagnosis not present

## 2022-10-12 DIAGNOSIS — Z9049 Acquired absence of other specified parts of digestive tract: Secondary | ICD-10-CM

## 2022-10-12 DIAGNOSIS — F419 Anxiety disorder, unspecified: Secondary | ICD-10-CM | POA: Diagnosis present

## 2022-10-12 DIAGNOSIS — E538 Deficiency of other specified B group vitamins: Secondary | ICD-10-CM | POA: Diagnosis present

## 2022-10-12 DIAGNOSIS — D72829 Elevated white blood cell count, unspecified: Secondary | ICD-10-CM

## 2022-10-12 DIAGNOSIS — Z8249 Family history of ischemic heart disease and other diseases of the circulatory system: Secondary | ICD-10-CM

## 2022-10-12 DIAGNOSIS — Z91048 Other nonmedicinal substance allergy status: Secondary | ICD-10-CM | POA: Diagnosis not present

## 2022-10-12 DIAGNOSIS — Z888 Allergy status to other drugs, medicaments and biological substances status: Secondary | ICD-10-CM

## 2022-10-12 DIAGNOSIS — E1165 Type 2 diabetes mellitus with hyperglycemia: Secondary | ICD-10-CM | POA: Diagnosis present

## 2022-10-12 DIAGNOSIS — K219 Gastro-esophageal reflux disease without esophagitis: Secondary | ICD-10-CM | POA: Diagnosis present

## 2022-10-12 DIAGNOSIS — F17210 Nicotine dependence, cigarettes, uncomplicated: Secondary | ICD-10-CM | POA: Diagnosis present

## 2022-10-12 DIAGNOSIS — K2970 Gastritis, unspecified, without bleeding: Secondary | ICD-10-CM | POA: Diagnosis not present

## 2022-10-12 DIAGNOSIS — Z79890 Hormone replacement therapy (postmenopausal): Secondary | ICD-10-CM | POA: Diagnosis not present

## 2022-10-12 DIAGNOSIS — Z91041 Radiographic dye allergy status: Secondary | ICD-10-CM

## 2022-10-12 DIAGNOSIS — R112 Nausea with vomiting, unspecified: Principal | ICD-10-CM

## 2022-10-12 DIAGNOSIS — R718 Other abnormality of red blood cells: Secondary | ICD-10-CM | POA: Diagnosis not present

## 2022-10-12 DIAGNOSIS — R1084 Generalized abdominal pain: Secondary | ICD-10-CM | POA: Diagnosis not present

## 2022-10-12 DIAGNOSIS — K224 Dyskinesia of esophagus: Secondary | ICD-10-CM | POA: Diagnosis not present

## 2022-10-12 DIAGNOSIS — Z90710 Acquired absence of both cervix and uterus: Secondary | ICD-10-CM

## 2022-10-12 DIAGNOSIS — Z87820 Personal history of traumatic brain injury: Secondary | ICD-10-CM

## 2022-10-12 DIAGNOSIS — M542 Cervicalgia: Secondary | ICD-10-CM | POA: Diagnosis present

## 2022-10-12 DIAGNOSIS — K2980 Duodenitis without bleeding: Secondary | ICD-10-CM | POA: Diagnosis present

## 2022-10-12 DIAGNOSIS — R1319 Other dysphagia: Secondary | ICD-10-CM

## 2022-10-12 DIAGNOSIS — Z720 Tobacco use: Secondary | ICD-10-CM

## 2022-10-12 DIAGNOSIS — R1314 Dysphagia, pharyngoesophageal phase: Secondary | ICD-10-CM | POA: Diagnosis present

## 2022-10-12 DIAGNOSIS — Z885 Allergy status to narcotic agent status: Secondary | ICD-10-CM

## 2022-10-12 DIAGNOSIS — E063 Autoimmune thyroiditis: Secondary | ICD-10-CM | POA: Diagnosis present

## 2022-10-12 DIAGNOSIS — R1013 Epigastric pain: Secondary | ICD-10-CM | POA: Diagnosis present

## 2022-10-12 DIAGNOSIS — K449 Diaphragmatic hernia without obstruction or gangrene: Secondary | ICD-10-CM | POA: Diagnosis not present

## 2022-10-12 DIAGNOSIS — K2990 Gastroduodenitis, unspecified, without bleeding: Secondary | ICD-10-CM | POA: Diagnosis not present

## 2022-10-12 LAB — CBC WITH DIFFERENTIAL/PLATELET
Abs Immature Granulocytes: 0.07 10*3/uL (ref 0.00–0.07)
Basophils Absolute: 0.1 10*3/uL (ref 0.0–0.1)
Basophils Relative: 1 %
Eosinophils Absolute: 0.4 10*3/uL (ref 0.0–0.5)
Eosinophils Relative: 3 %
HCT: 40.9 % (ref 36.0–46.0)
Hemoglobin: 14 g/dL (ref 12.0–15.0)
Immature Granulocytes: 1 %
Lymphocytes Relative: 27 %
Lymphs Abs: 3.4 10*3/uL (ref 0.7–4.0)
MCH: 35.2 pg — ABNORMAL HIGH (ref 26.0–34.0)
MCHC: 34.2 g/dL (ref 30.0–36.0)
MCV: 102.8 fL — ABNORMAL HIGH (ref 80.0–100.0)
Monocytes Absolute: 0.5 10*3/uL (ref 0.1–1.0)
Monocytes Relative: 4 %
Neutro Abs: 8.1 10*3/uL — ABNORMAL HIGH (ref 1.7–7.7)
Neutrophils Relative %: 64 %
Platelets: 256 10*3/uL (ref 150–400)
RBC: 3.98 MIL/uL (ref 3.87–5.11)
RDW: 13.3 % (ref 11.5–15.5)
WBC: 12.6 10*3/uL — ABNORMAL HIGH (ref 4.0–10.5)
nRBC: 0 % (ref 0.0–0.2)

## 2022-10-12 LAB — COMPREHENSIVE METABOLIC PANEL
ALT: 39 U/L (ref 0–44)
AST: 30 U/L (ref 15–41)
Albumin: 4.3 g/dL (ref 3.5–5.0)
Alkaline Phosphatase: 137 U/L — ABNORMAL HIGH (ref 38–126)
Anion gap: 11 (ref 5–15)
BUN: 8 mg/dL (ref 6–20)
CO2: 23 mmol/L (ref 22–32)
Calcium: 9.2 mg/dL (ref 8.9–10.3)
Chloride: 100 mmol/L (ref 98–111)
Creatinine, Ser: 0.77 mg/dL (ref 0.44–1.00)
GFR, Estimated: >60 mL/min (ref >60)
Glucose, Bld: 296 mg/dL — ABNORMAL HIGH (ref 70–99)
Potassium: 3.8 mmol/L (ref 3.5–5.1)
Sodium: 134 mmol/L — ABNORMAL LOW (ref 135–145)
Total Bilirubin: 0.9 mg/dL (ref 0.3–1.2)
Total Protein: 8 g/dL (ref 6.5–8.1)

## 2022-10-12 LAB — RAPID URINE DRUG SCREEN, HOSP PERFORMED
Amphetamines: NOT DETECTED
Barbiturates: NOT DETECTED
Benzodiazepines: POSITIVE — AB
Cocaine: NOT DETECTED
Opiates: POSITIVE — AB
Tetrahydrocannabinol: NOT DETECTED

## 2022-10-12 LAB — GLUCOSE, CAPILLARY
Glucose-Capillary: 150 mg/dL — ABNORMAL HIGH (ref 70–99)
Glucose-Capillary: 139 mg/dL — ABNORMAL HIGH (ref 70–99)

## 2022-10-12 LAB — IRON AND TIBC
Iron: 126 ug/dL (ref 28–170)
Saturation Ratios: 50 % — ABNORMAL HIGH (ref 10.4–31.8)
TIBC: 251 ug/dL (ref 250–450)
UIBC: 125 ug/dL

## 2022-10-12 LAB — VITAMIN B12: Vitamin B-12: 313 pg/mL (ref 180–914)

## 2022-10-12 LAB — FERRITIN: Ferritin: 73 ng/mL (ref 11–307)

## 2022-10-12 LAB — LIPASE, BLOOD: Lipase: 33 U/L (ref 11–51)

## 2022-10-12 LAB — HEPATITIS B SURFACE ANTIGEN: Hepatitis B Surface Ag: NONREACTIVE

## 2022-10-12 LAB — HEPATITIS C ANTIBODY: HCV Ab: NONREACTIVE

## 2022-10-12 LAB — FOLATE: Folate: 3.9 ng/mL — ABNORMAL LOW (ref >5.9)

## 2022-10-12 MED ORDER — MORPHINE SULFATE (PF) 4 MG/ML IV SOLN
4.0000 mg | Freq: Once | INTRAVENOUS | Status: AC
  Administered 2022-10-12: 4 mg via INTRAVENOUS
  Filled 2022-10-12: qty 1

## 2022-10-12 MED ORDER — ACETAMINOPHEN 650 MG RE SUPP: 650.0000 mg | Freq: Four times a day (QID) | RECTAL | Status: DC | PRN

## 2022-10-12 MED ORDER — PANTOPRAZOLE SODIUM 40 MG IV SOLR
40.0000 mg | INTRAVENOUS | Status: DC
Start: 2022-10-12 — End: 2022-10-12

## 2022-10-12 MED ORDER — SODIUM CHLORIDE 0.9 % IV SOLN
12.5000 mg | Freq: Four times a day (QID) | INTRAVENOUS | Status: DC | PRN
  Administered 2022-10-12 – 2022-10-13 (×2): 12.5 mg via INTRAVENOUS
  Filled 2022-10-12 (×2): qty 0.5

## 2022-10-12 MED ORDER — ENOXAPARIN SODIUM 40 MG/0.4ML IJ SOSY
40.0000 mg | PREFILLED_SYRINGE | INTRAMUSCULAR | Status: DC
  Administered 2022-10-12: 40 mg via SUBCUTANEOUS
  Filled 2022-10-12: qty 0.4

## 2022-10-12 MED ORDER — ONDANSETRON HCL 4 MG/2ML IJ SOLN
4.0000 mg | Freq: Once | INTRAMUSCULAR | Status: AC
  Administered 2022-10-12: 4 mg via INTRAVENOUS
  Filled 2022-10-12: qty 2

## 2022-10-12 MED ORDER — PROMETHAZINE HCL 25 MG/ML IJ SOLN
INTRAMUSCULAR | Status: AC
  Filled 2022-10-12: qty 1

## 2022-10-12 MED ORDER — ALPRAZOLAM 0.5 MG PO TABS
0.5000 mg | ORAL_TABLET | Freq: Two times a day (BID) | ORAL | Status: DC | PRN
Start: 2022-10-12 — End: 2022-10-14
  Administered 2022-10-12 – 2022-10-14 (×4): 0.5 mg via ORAL
  Filled 2022-10-12 (×4): qty 1

## 2022-10-12 MED ORDER — MOMETASONE FURO-FORMOTEROL FUM 200-5 MCG/ACT IN AERO
2.0000 | INHALATION_SPRAY | Freq: Two times a day (BID) | RESPIRATORY_TRACT | Status: DC
  Administered 2022-10-12 – 2022-10-14 (×5): 2 via RESPIRATORY_TRACT
  Filled 2022-10-12: qty 8.8

## 2022-10-12 MED ORDER — SODIUM CHLORIDE 0.9 % IV BOLUS
1000.0000 mL | Freq: Once | INTRAVENOUS | Status: AC
  Administered 2022-10-12: 1000 mL via INTRAVENOUS

## 2022-10-12 MED ORDER — ACETAMINOPHEN 325 MG PO TABS: 650.0000 mg | ORAL_TABLET | Freq: Four times a day (QID) | ORAL | Status: DC | PRN

## 2022-10-12 MED ORDER — PANTOPRAZOLE SODIUM 40 MG IV SOLR
40.0000 mg | Freq: Two times a day (BID) | INTRAVENOUS | Status: DC
  Administered 2022-10-12 – 2022-10-14 (×5): 40 mg via INTRAVENOUS
  Filled 2022-10-12 (×5): qty 10

## 2022-10-12 MED ORDER — INSULIN ASPART 100 UNIT/ML IJ SOLN: 0.0000 [IU] | Freq: Every day | INTRAMUSCULAR | Status: DC

## 2022-10-12 MED ORDER — INSULIN ASPART 100 UNIT/ML IJ SOLN
0.0000 [IU] | Freq: Three times a day (TID) | INTRAMUSCULAR | Status: DC
  Administered 2022-10-12: 2 [IU] via SUBCUTANEOUS
  Administered 2022-10-12 – 2022-10-13 (×3): 1 [IU] via SUBCUTANEOUS
  Administered 2022-10-13: 2 [IU] via SUBCUTANEOUS
  Administered 2022-10-13 – 2022-10-14 (×2): 1 [IU] via SUBCUTANEOUS

## 2022-10-12 MED ORDER — SODIUM CHLORIDE 0.9 % IV SOLN: INTRAVENOUS | Status: AC

## 2022-10-12 MED ORDER — HYDROMORPHONE HCL 1 MG/ML IJ SOLN
0.5000 mg | INTRAMUSCULAR | Status: DC | PRN
  Administered 2022-10-12 – 2022-10-13 (×7): 0.5 mg via INTRAVENOUS
  Filled 2022-10-12 (×7): qty 0.5

## 2022-10-12 MED ORDER — KETOROLAC TROMETHAMINE 30 MG/ML IJ SOLN
30.0000 mg | Freq: Once | INTRAMUSCULAR | Status: AC
  Administered 2022-10-12: 30 mg via INTRAVENOUS
  Filled 2022-10-12: qty 1

## 2022-10-12 MED ORDER — MELATONIN 3 MG PO TABS
6.0000 mg | ORAL_TABLET | Freq: Once | ORAL | Status: AC
  Administered 2022-10-12: 6 mg via ORAL
  Filled 2022-10-12: qty 2

## 2022-10-12 MED ORDER — LEVOTHYROXINE SODIUM 137 MCG PO TABS
137.0000 ug | ORAL_TABLET | Freq: Every day | ORAL | Status: DC
  Administered 2022-10-13 – 2022-10-14 (×2): 137 ug via ORAL
  Filled 2022-10-12 (×3): qty 1

## 2022-10-12 MED ORDER — INSULIN GLARGINE-YFGN 100 UNIT/ML ~~LOC~~ SOLN
10.0000 [IU] | Freq: Every day | SUBCUTANEOUS | Status: DC
  Administered 2022-10-13: 10 [IU] via SUBCUTANEOUS
  Filled 2022-10-12 (×3): qty 0.1

## 2022-10-12 NOTE — ED Provider Notes (Signed)
[facility] EMERGENCY DEPARTMENT Provider Note   CSN: [id number] Arrival date & time: 10/12/22  0136     History  Chief Complaint  Patient presents with   Emesis    Melanie Shepard is a 56 y.o. female.  Patient is a 56 year old female with past medical history of COPD, anxiety, chronic episodic abdominal pain, hyperlipidemia.  Patient presenting today with complaints of nausea and vomiting.  This started approximately 6 PM.  She reports vomiting every 15 minutes or so since.  She has been unable to keep any fluids down.  She denies to me she is having any diarrhea.  She denies generalized abdominal cramping.  Patient tells me she has a history of similar episodes in the past and was hospitalized for 3 days 2 months ago.  She has been having episodes like this for many years.  She denies having consumed any suspicious foods and denies any ill contacts with similar symptoms.  The history is provided by the patient.  Emesis Severity:  Moderate Duration:  5 hours Timing:  Constant Quality:  Stomach contents      Home Medications Prior to Admission medications   Medication Sig Start Date End Date Taking? Authorizing Provider  ALPRAZolam Duanne Moron) 0.5 MG tablet Take 0.5 mg by mouth in the morning and at bedtime. 11/03/19   [provider]  budesonide-formoterol (SYMBICORT) 160-4.5 MCG/ACT inhaler Inhale 2 puffs into the lungs 2 (two) times daily. 05/10/22   [provider]  calcium carbonate (TUMS - DOSED IN MG ELEMENTAL CALCIUM) 500 MG chewable tablet Chew 2 tablets by mouth daily as needed for indigestion or heartburn.    [provider]  dicyclomine (BENTYL) 20 MG tablet Take 20 mg by mouth every 6 (six) hours as needed. 08/15/22   [provider]  MELATONIN GUMMIES PO Take 3-4 capsules by mouth at bedtime.    [provider]  ondansetron (ZOFRAN) 4 MG tablet TAKE 1 TABLET(4 MG) BY MOUTH EVERY 8 HOURS AS NEEDED FOR NAUSEA OR VOMITING Patient  taking differently: Take 8 mg by mouth every 8 (eight) hours as needed for nausea or vomiting. 08/12/20   Mahala Menghini, PA-C  pantoprazole (PROTONIX) 40 MG tablet TAKE 1 TABLET(40 MG) BY MOUTH TWICE DAILY BEFORE A MEAL Patient taking differently: Take 40 mg by mouth 2 (two) times daily. 07/27/17   Mahala Menghini, PA-C  PRALUENT 75 MG/ML SOAJ Inject 1 mL into the skin every 14 (fourteen) days. 07/31/22   [provider]  promethazine (PHENERGAN) 25 MG tablet Take 0.5-1 tablets (12.5-25 mg total) by mouth every 6 (six) hours as needed for nausea or vomiting. 04/19/20   Hayden Rasmussen, MD  SYNTHROID 137 MCG tablet Take 137 mcg by mouth daily. 08/15/22   [provider]  Vitamin D, Ergocalciferol, (DRISDOL) 50000 units CAPS capsule Take 50,000 Units by mouth 2 (two) times a week.  11/29/17   [provider]      Allergies    Cymbalta [duloxetine hcl], Ivp dye [iodinated contrast media], Reglan [metoclopramide], Trazodone and nefazodone, and Adhesive [tape]    Review of Systems   Review of Systems  Gastrointestinal:  Positive for vomiting.  All other systems reviewed and are negative.   Physical Exam Updated Vital Signs BP (!) 151/94   Pulse 86   Temp 99 F (37.2 C) (Oral)   Resp 18   Ht '5\' 7"'$  (1.702 m)   Wt 61.2 kg   SpO2 96%   BMI 21.13 kg/m  Physical Exam Vitals and nursing note reviewed.  Constitutional:      General: She is not in acute distress.    Appearance: She is well-developed. She is not diaphoretic.  HENT:     Head: Normocephalic and atraumatic.  Cardiovascular:     Rate and Rhythm: Normal rate and regular rhythm.     Heart sounds: No murmur heard.    No friction rub. No gallop.  Pulmonary:     Effort: Pulmonary effort is normal. No respiratory distress.     Breath sounds: Normal breath sounds. No wheezing.  Abdominal:     General: Bowel sounds are normal. There is no distension.     Palpations: Abdomen is soft.     Tenderness: There  is abdominal tenderness. There is no guarding or rebound.     Comments: There is mild generalized abdominal tenderness.  Musculoskeletal:        General: Normal range of motion.     Cervical back: Normal range of motion and neck supple.  Skin:    General: Skin is warm and dry.  Neurological:     General: No focal deficit present.     Mental Status: She is alert and oriented to person, place, and time.     ED Results / Procedures / Treatments   Labs (all labs ordered are listed, but only abnormal results are displayed) Labs Reviewed  CBC WITH DIFFERENTIAL/PLATELET  COMPREHENSIVE METABOLIC PANEL  LIPASE, BLOOD    EKG None  Radiology No results found.  Procedures Procedures    Medications Ordered in ED Medications  sodium chloride 0.9 % bolus 1,000 mL (has no administration in time range)  ondansetron (ZOFRAN) injection 4 mg (has no administration in time range)  ketorolac (TORADOL) 30 MG/ML injection 30 mg (has no administration in time range)  morphine (PF) 4 MG/ML injection 4 mg (has no administration in time range)    ED Course/ Medical Decision Making/ A&P  Patient with past medical history as per HPI presenting with complaints of abdominal pain, nausea, and vomiting.  She has had episodes of this in the past requiring admission.  The episodes have been unexplained.  She arrives here with stable vital signs and is afebrile.  She appears somewhat pale and abdomen is tender throughout.  She is actively retching upon my evaluation.  Remainder of the physical examination otherwise unremarkable.  Work-up initiated including CBC, CMP, lipase.  All of these are unremarkable.  She was given IV fluids along with repeat doses of morphine along with Zofran and Phenergan, however continues to not feel well.  As she was not improving with the above interventions, a CT scan of the abdomen and pelvis was obtained showing no acute intra-abdominal process and no explanation for the  patient's symptoms.  At this point, I feel as though she will require admission for intractable abdominal pain and vomiting.  I have spoken with Dr. Josephine Cables who agrees to admit.  Final Clinical Impression(s) / ED Diagnoses Final diagnoses:  None    Rx / DC Orders ED Discharge Orders     None         Veryl Speak, MD 10/12/22 [phone]

## 2022-10-12 NOTE — Progress Notes (Signed)
Melanie Shepard is a 56 y.o. female with medical history significant of type 2 diabetes mellitus, hypothyroidism, hyperlipidemia, anxiety and tobacco use who presents to the emergency department due to nausea and vomiting which started yesterday around 6 PM.  She is noted to have intractable nausea, vomiting, and abdominal pain and this appears to be a longstanding problem with multiple prior procedures for investigation.  She has been seen by gastroenterology with plans for EGD in a.m.  Patient seen and evaluated at bedside and has been admitted after midnight.  A.m. labs ordered.  Total care time: 30 minutes.

## 2022-10-12 NOTE — TOC Progression Note (Signed)
  Transition of Care [facility]) Screening Note   Patient Details  Name: Melanie Shepard Date of Birth: 1966-08-13   Transition of Care [facility]) CM/SW Contact:    Boneta Lucks, RN Phone Number: 10/12/2022, 11:43 AM    Transition of Care Department [facility]) has reviewed patient and no TOC needs have been identified at this time. We will continue to monitor patient advancement through interdisciplinary progression rounds. If new patient transition needs arise, please place a TOC consult.     Expected Discharge Plan: Home/Self Care Barriers to Discharge: Continued Medical Work up  Expected Discharge Plan and Services Expected Discharge Plan: Home/Self Care       Living arrangements for the past 2 months: [name]

## 2022-10-12 NOTE — ED Triage Notes (Signed)
Pt arrives POV c/o emesis since about 6pm last night and abd pain. Pt also c/o headache.
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
## 2022-10-13 ENCOUNTER — Encounter (HOSPITAL_COMMUNITY): Payer: Self-pay | Admitting: Internal Medicine

## 2022-10-13 ENCOUNTER — Inpatient Hospital Stay (HOSPITAL_COMMUNITY): Payer: Medicare (Managed Care) | Admitting: Anesthesiology

## 2022-10-13 ENCOUNTER — Encounter (HOSPITAL_COMMUNITY)
Admission: EM | Disposition: A | Discharge status: Home / Self Care | Payer: Self-pay | Source: Home / Self Care | Attending: Internal Medicine

## 2022-10-13 DIAGNOSIS — K2990 Gastroduodenitis, unspecified, without bleeding: Secondary | ICD-10-CM

## 2022-10-13 DIAGNOSIS — R112 Nausea with vomiting, unspecified: Secondary | ICD-10-CM | POA: Diagnosis not present

## 2022-10-13 DIAGNOSIS — K224 Dyskinesia of esophagus: Principal | ICD-10-CM

## 2022-10-13 DIAGNOSIS — K2980 Duodenitis without bleeding: Secondary | ICD-10-CM

## 2022-10-13 DIAGNOSIS — R1319 Other dysphagia: Secondary | ICD-10-CM

## 2022-10-13 DIAGNOSIS — J449 Chronic obstructive pulmonary disease, unspecified: Secondary | ICD-10-CM

## 2022-10-13 DIAGNOSIS — K449 Diaphragmatic hernia without obstruction or gangrene: Secondary | ICD-10-CM

## 2022-10-13 DIAGNOSIS — K2970 Gastritis, unspecified, without bleeding: Secondary | ICD-10-CM

## 2022-10-13 DIAGNOSIS — R1013 Epigastric pain: Secondary | ICD-10-CM

## 2022-10-13 HISTORY — PX: BIOPSY: SHX5522

## 2022-10-13 HISTORY — PX: ESOPHAGEAL DILATION: SHX303

## 2022-10-13 HISTORY — PX: ESOPHAGOGASTRODUODENOSCOPY (EGD) WITH PROPOFOL: SHX5813

## 2022-10-13 LAB — COMPREHENSIVE METABOLIC PANEL
ALT: 27 U/L (ref 0–44)
AST: 20 U/L (ref 15–41)
Albumin: 3.6 g/dL (ref 3.5–5.0)
Alkaline Phosphatase: 107 U/L (ref 38–126)
Anion gap: 9 (ref 5–15)
BUN: 7 mg/dL (ref 6–20)
CO2: 25 mmol/L (ref 22–32)
Calcium: 9 mg/dL (ref 8.9–10.3)
Chloride: 107 mmol/L (ref 98–111)
Creatinine, Ser: 0.65 mg/dL (ref 0.44–1.00)
GFR, Estimated: >60 mL/min (ref >60)
Glucose, Bld: 136 mg/dL — ABNORMAL HIGH (ref 70–99)
Potassium: 3.7 mmol/L (ref 3.5–5.1)
Sodium: 141 mmol/L (ref 135–145)
Total Bilirubin: 1.1 mg/dL (ref 0.3–1.2)
Total Protein: 6.4 g/dL — ABNORMAL LOW (ref 6.5–8.1)

## 2022-10-13 LAB — MAGNESIUM: Magnesium: 1.9 mg/dL (ref 1.7–2.4)

## 2022-10-13 LAB — CBC
HCT: 37.4 % (ref 36.0–46.0)
Hemoglobin: 12.6 g/dL (ref 12.0–15.0)
MCH: 35.2 pg — ABNORMAL HIGH (ref 26.0–34.0)
MCHC: 33.7 g/dL (ref 30.0–36.0)
MCV: 104.5 fL — ABNORMAL HIGH (ref 80.0–100.0)
Platelets: 209 10*3/uL (ref 150–400)
RBC: 3.58 MIL/uL — ABNORMAL LOW (ref 3.87–5.11)
RDW: 13.5 % (ref 11.5–15.5)
WBC: 9.5 10*3/uL (ref 4.0–10.5)
nRBC: 0 % (ref 0.0–0.2)

## 2022-10-13 LAB — GLUCOSE, CAPILLARY
Glucose-Capillary: 168 mg/dL — ABNORMAL HIGH (ref 70–99)
Glucose-Capillary: 135 mg/dL — ABNORMAL HIGH (ref 70–99)
Glucose-Capillary: 128 mg/dL — ABNORMAL HIGH (ref 70–99)
Glucose-Capillary: 182 mg/dL — ABNORMAL HIGH (ref 70–99)
Glucose-Capillary: 143 mg/dL — ABNORMAL HIGH (ref 70–99)

## 2022-10-13 LAB — MITOCHONDRIAL ANTIBODIES: Mitochondrial M2 Ab, IgG: <20 Units (ref 0.0–20.0)

## 2022-10-13 LAB — APTT: aPTT: 27 seconds (ref 24–36)

## 2022-10-13 LAB — ANA: Anti Nuclear Antibody (ANA): NEGATIVE

## 2022-10-13 LAB — PHOSPHORUS: Phosphorus: 4.4 mg/dL (ref 2.5–4.6)

## 2022-10-13 SURGERY — ESOPHAGOGASTRODUODENOSCOPY (EGD) WITH PROPOFOL
Anesthesia: General

## 2022-10-13 MED ORDER — LIDOCAINE HCL 1 % IJ SOLN
INTRAMUSCULAR | Status: DC | PRN
  Administered 2022-10-13: 100 mg via INTRADERMAL

## 2022-10-13 MED ORDER — PROPOFOL 10 MG/ML IV BOLUS
INTRAVENOUS | Status: DC | PRN
  Administered 2022-10-13 (×2): 40 mg via INTRAVENOUS
  Administered 2022-10-13: 80 mg via INTRAVENOUS
  Administered 2022-10-13 (×5): 20 mg via INTRAVENOUS

## 2022-10-13 MED ORDER — LACTATED RINGERS IV SOLN: INTRAVENOUS | Status: DC

## 2022-10-13 MED ORDER — SODIUM CHLORIDE 0.9 % IV SOLN: INTRAVENOUS | Status: DC

## 2022-10-13 MED ORDER — HYDROMORPHONE HCL 1 MG/ML IJ SOLN
1.0000 mg | INTRAMUSCULAR | Status: DC | PRN
  Administered 2022-10-13 – 2022-10-14 (×7): 1 mg via INTRAVENOUS
  Filled 2022-10-13 (×7): qty 1

## 2022-10-13 MED ORDER — MELATONIN 3 MG PO TABS
6.0000 mg | ORAL_TABLET | Freq: Every evening | ORAL | Status: DC | PRN
  Administered 2022-10-13: 6 mg via ORAL
  Filled 2022-10-13: qty 2

## 2022-10-13 NOTE — Anesthesia Preprocedure Evaluation (Addendum)
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   Pulmonary exam normal breath sounds clear to auscultation       Cardiovascular Exercise Tolerance: Good + CAD  Normal cardiovascular exam Rhythm:Regular Rate:Normal     Neuro/Psych Seizures -, Well Controlled,  PSYCHIATRIC DISORDERS Anxiety     CVA (traumatic brain injury)    GI/Hepatic Neg liver ROS,GERD (epigastric pain, coffee ground emisis, dysphagia)  Medicated,,  Endo/Other  diabetes, Well Controlled, Type 2, Oral Hypoglycemic AgentsHypothyroidism    Renal/GU negative Renal ROS  negative genitourinary   Musculoskeletal  (+) Arthritis , Osteoarthritis,    Abdominal   Peds negative pediatric ROS (+)  Hematology  (+) Blood dyscrasia, anemia   Anesthesia Other Findings   Reproductive/Obstetrics negative OB ROS                             Anesthesia Physical Anesthesia Plan  ASA: 3  Anesthesia Plan: General   Post-op Pain Management: Minimal or no pain anticipated   Induction: Intravenous  PONV Risk Score and Plan: Propofol infusion  Airway Management Planned: Nasal Cannula and Natural Airway  Additional Equipment:   Intra-op Plan:   Post-operative Plan:   Informed Consent: I have reviewed the patients History and Physical, chart, labs and discussed the procedure including the risks, benefits and alternatives  for the proposed anesthesia with the patient or authorized representative who has indicated his/her understanding and acceptance.     Dental advisory given  Plan Discussed with: CRNA and Surgeon  Anesthesia Plan Comments:         Anesthesia Quick Evaluation

## 2022-10-13 NOTE — Transfer of Care (Signed)
Immediate Anesthesia Transfer of Care Note  Patient: Melanie Shepard  Procedure(s) Performed: ESOPHAGOGASTRODUODENOSCOPY (EGD) WITH PROPOFOL ESOPHAGEAL DILATION BIOPSY  Patient Location: Short Stay  Anesthesia Type:General  Level of Consciousness: awake  Airway & Oxygen Therapy: Patient Spontanous Breathing  Post-op Assessment: Report given to RN  Post vital signs: Reviewed  Last Vitals:  Vitals Value Taken Time  BP    Temp    Pulse 79 10/13/22 1415  Resp 18 10/13/22 1415  SpO2 96 % 10/13/22 1415  Vitals shown include unvalidated device data.  Last Pain:  Vitals:   10/13/22 1351  TempSrc:   PainSc: 5       Patients Stated Pain Goal: 0 (70/01/74 [id number])  Complications: No notable events documented.

## 2022-10-13 NOTE — Care Management Important Message (Signed)
Important Message  Patient Details  Name: Melanie Shepard MRN: [id number] Date of Birth: 1966-11-29   Medicare Important Message Given:  Yes     Tommy Medal 10/13/2022, 11:38 AM

## 2022-10-13 NOTE — Brief Op Note (Signed)
10/12/2022 - 10/13/2022  2:11 PM  PATIENT:  Melanie Shepard  56 y.o. female  PRE-OPERATIVE DIAGNOSIS:  N/V, epigastric pain, coffee ground emesis, dysphagia  POST-OPERATIVE DIAGNOSIS:  gastritis, duodenitis, hiatal hernia  PROCEDURE:  Procedure(s): ESOPHAGOGASTRODUODENOSCOPY (EGD) WITH PROPOFOL (N/A) ESOPHAGEAL DILATION (N/A) BIOPSY  SURGEON:  Surgeon(s) and Role:    * Harvel Quale, MD - Primary  Patient underwent EGD under propofol sedation.  Tolerated the procedure adequately.  Abnormal motility was noted in the esophagus.  There is primary peristalsis of the esophageal body.  The distal esophagus/lower esophageal sphincter is open.  Biopsies were obtained from the proximal and distal esophagus with cold forceps for histology of eosinophilic esophagitis.  A guidewire was placed and the scope was withdrawn.  Dilation was performed with a Savary dilator with no resistance at 18 mm.  No mucosal disruption was seen upon reinspection.Localized mild inflammation characterized by erythema was found in the gastric body and in the gastric antrum.  Biopsies were taken with a cold forceps for histology. Patchy mild inflammation characterized by congestion (edema) was found in the first portion of the duodenum.   RECOMMENDATIONS - Return patient to hospital ward for ongoing care.  - Advance diet as tolerated.  - Continue combination of phenergan and Zofran for nausea control. - Await pathology results.   Maylon Peppers, MD Gastroenterology and Hepatology [facility]

## 2022-10-13 NOTE — Progress Notes (Signed)
We will proceed with EGD as scheduled.  I thoroughly discussed with the patient his procedure, including the risks involved. Patient understands what the procedure involves including the benefits and any risks. Patient understands alternatives to the proposed procedure. Risks including (but not limited to) bleeding, tearing of the lining (perforation), rupture of adjacent organs, problems with heart and lung function, infection, and medication reactions. A small percentage of complications may require surgery, hospitalization, repeat endoscopic procedure, and/or transfusion.  Patient understood and agreed.  Aarohi Redditt Castaneda, MD Gastroenterology and Hepatology [facility]  

## 2022-10-13 NOTE — Progress Notes (Signed)
PROGRESS NOTE    Melanie Shepard  ZTI:[id number] DOB: 08-Jun-1966 DOA: 10/12/2022 PCP: Moshe Cipro, MD   Brief Narrative:     Melanie Shepard is a 56 y.o. female with medical history significant of type 2 diabetes mellitus, hypothyroidism, hyperlipidemia, anxiety and tobacco use who presents to the emergency department due to nausea and vomiting which started yesterday around 6 PM.  She is noted to have intractable nausea, vomiting, and abdominal pain and this appears to be a longstanding problem with multiple prior procedures for investigation.  She has been seen by gastroenterology with plans for EGD today.  Assessment & Plan:   Principal Problem:   Intractable nausea and vomiting Active Problems:   Anxiety disorder   Abdominal pain, epigastric   DM type 2 (diabetes mellitus, type 2) (HCC)   Tobacco abuse   Elevated MCV   Pseudohyponatremia   Hashimoto's thyroiditis   Hepatic steatosis   Esophageal dysphagia  Assessment and Plan:   Intractable nausea, vomiting and abdominal pain Patient may have an underlying gastroparesis considering history of T2DM Continue Phenergan as needed Continue Protonix GI planning for EGD today   Elevated MCV Folate levels noted to be low, can plan to supplement on discharge   Type 2 diabetes mellitus with improved blood glucose control Hemoglobin A1c on 08/17/2022 was 7.0 Continue Semglee 10 units nightly and adjust dose accordingly Continue ISS and hypoglycemia protocol   Hashimoto's thyroiditis Continue Synthroid   Anxiety Continuing Xanax   Tobacco abuse Patient was counseled on tobacco abuse cessation Continue nicotine patch      DVT prophylaxis: SCDs Code Status: Full Family Communication: None at bedside Disposition Plan:  Status is: Inpatient Remains inpatient appropriate because: Need for IV medications.   Consultants:  GI  Procedures:  None  Antimicrobials:  None   Subjective: Patient seen and evaluated today  with ongoing abdominal pain and some nausea.  Awaiting EGD today.  Objective: Vitals:   10/12/22 2040 10/13/22 0538 10/13/22 0805 10/13/22 1055  BP: 101/60 116/69  113/82  Pulse: 85 73  77  Resp: '18 18  18  '$ Temp: 98.3 F (36.8 C) 98.2 F (36.8 C)  97.7 F (36.5 C)  TempSrc: Oral Oral  Oral  SpO2: 96% 93% 93% 94%  Weight:      Height:        Intake/Output Summary (Last 24 hours) at 10/13/2022 1101 Last data filed at 10/12/2022 1500 Gross per 24 hour  Intake 841.76 ml  Output --  Net 841.76 ml   Filed Weights   10/12/22 0145 10/12/22 0540  Weight: 61.2 kg 66.1 kg    Examination:  General exam: Appears calm and comfortable  Respiratory system: Clear to auscultation. Respiratory effort normal. Cardiovascular system: S1 & S2 heard, RRR.  Gastrointestinal system: Abdomen is soft Central nervous system: Alert and awake Extremities: No edema Skin: No significant lesions noted Psychiatry: Flat affect.    Data Reviewed: I have personally reviewed following labs and imaging studies  CBC: Recent Labs  Lab 10/12/22 0238 10/13/22 0447  WBC 12.6* 9.5  NEUTROABS 8.1*  --   HGB 14.0 12.6  HCT 40.9 37.4  MCV 102.8* 104.5*  PLT 256 825   Basic Metabolic Panel: Recent Labs  Lab 10/12/22 0238 10/13/22 0447  NA 134* 141  K 3.8 3.7  CL 100 107  CO2 23 25  GLUCOSE 296* 136*  BUN 8 7  CREATININE 0.77 0.65  CALCIUM 9.2 9.0  MG  --  1.9  PHOS  --  4.4   GFR: Estimated Creatinine Clearance: 76.4 mL/min (by C-G formula based on SCr of 0.65 mg/dL). Liver Function Tests: Recent Labs  Lab 10/12/22 0238 10/13/22 0447  AST 30 20  ALT 39 27  ALKPHOS 137* 107  BILITOT 0.9 1.1  PROT 8.0 6.4*  ALBUMIN 4.3 3.6   Recent Labs  Lab 10/12/22 0238  LIPASE 33   No results for input(s): "AMMONIA" in the last 168 hours. Coagulation Profile: No results for input(s): "INR", "PROTIME" in the last 168 hours. Cardiac Enzymes: No results for input(s): "CKTOTAL", "CKMB",  "CKMBINDEX", "TROPONINI" in the last 168 hours. BNP (last 3 results) No results for input(s): "PROBNP" in the last 8760 hours. HbA1C: No results for input(s): "HGBA1C" in the last 72 hours. CBG: Recent Labs  Lab 10/12/22 0854 10/12/22 1307 10/12/22 2101  GLUCAP 168* 150* 139*   Lipid Profile: No results for input(s): "CHOL", "HDL", "LDLCALC", "TRIG", "CHOLHDL", "LDLDIRECT" in the last 72 hours. Thyroid Function Tests: No results for input(s): "TSH", "T4TOTAL", "FREET4", "T3FREE", "THYROIDAB" in the last 72 hours. Anemia Panel: Recent Labs    10/12/22 0238 10/12/22 1101  VITAMINB12 313  --   FOLATE 3.9*  --   FERRITIN  --  73  TIBC  --  251  IRON  --  126   Sepsis Labs: No results for input(s): "PROCALCITON", "LATICACIDVEN" in the last 168 hours.  No results found for this or any previous visit (from the past 240 hour(s)).       Radiology Studies: CT ABDOMEN PELVIS WO CONTRAST  Result Date: 10/12/2022 CLINICAL DATA:  Abdominal pain, acute, nonlocalized EXAM: CT ABDOMEN AND PELVIS WITHOUT CONTRAST TECHNIQUE: Multidetector CT imaging of the abdomen and pelvis was performed following the standard protocol without IV contrast. RADIATION DOSE REDUCTION: This exam was performed according to the departmental dose-optimization program which includes automated exposure control, adjustment of the mA and/or kV according to patient size and/or use of iterative reconstruction technique. COMPARISON:  None Available. FINDINGS: Lower chest: No acute abnormality. Hepatobiliary: Severe hepatic steatosis. No enhancing intrahepatic mass. No intra or extrahepatic biliary ductal dilation. Status post cholecystectomy. Pancreas: Unremarkable Spleen: Unremarkable Adrenals/Urinary Tract: Adrenal glands are unremarkable. Kidneys are normal, without renal calculi, focal lesion, or hydronephrosis. Bladder is unremarkable. Stomach/Bowel: Moderate colonic stool burden. Stomach, small bowel, and large bowel  are otherwise unremarkable. No evidence of obstruction or focal inflammation. No free intraperitoneal gas or fluid. Appendix absent. Vascular/Lymphatic: Aortic atherosclerosis. No enlarged abdominal or pelvic lymph nodes. Reproductive: Status post hysterectomy. No adnexal masses. Other: No abdominal wall hernia or abnormality. No abdominopelvic ascites. Musculoskeletal: No acute or significant osseous findings. IMPRESSION: 1. No acute intra-abdominal pathology identified. No definite radiographic explanation for the patient's reported symptoms. 2. Severe hepatic steatosis. 3. Moderate colonic stool burden. 4. Aortic atherosclerosis. Aortic Atherosclerosis (ICD10-I70.0). Electronically Signed   By: Fidela Salisbury M.D.   On: 10/12/2022 04:09        Scheduled Meds:  insulin aspart  0-5 Units Subcutaneous QHS   insulin aspart  0-9 Units Subcutaneous TID WC   insulin glargine-yfgn  10 Units Subcutaneous QHS   levothyroxine  137 mcg Oral Q0600   mometasone-formoterol  2 puff Inhalation BID   pantoprazole (PROTONIX) IV  40 mg Intravenous Q12H   Continuous Infusions:  promethazine (PHENERGAN) injection (IM or IVPB) Stopped (10/12/22 0455)     LOS: 1 day    Time spent: 35 minutes    Kassity Woodson Darleen Crocker, DO [facility]  If 7PM-7AM, please contact  night-coverage [email or web address] 10/13/2022, 11:01 AM

## 2022-10-13 NOTE — Anesthesia Postprocedure Evaluation (Signed)
Anesthesia Post Note  Patient: Melanie Shepard  Procedure(s) Performed: ESOPHAGOGASTRODUODENOSCOPY (EGD) WITH PROPOFOL ESOPHAGEAL DILATION BIOPSY  Patient location during evaluation: Short Stay Anesthesia Type: General Level of consciousness: awake and alert Pain management: pain level controlled Vital Signs Assessment: post-procedure vital signs reviewed and stable Respiratory status: spontaneous breathing Cardiovascular status: blood pressure returned to baseline and stable Postop Assessment: no apparent nausea or vomiting Anesthetic complications: no   No notable events documented.   Last Vitals:  Vitals:   10/13/22 1302 10/13/22 1315  BP: 96/66 108/70  Pulse: 74 77  Resp: (!) 23 (!) 21  Temp: 36.9 C   SpO2: 94% 93%    Last Pain:  Vitals:   10/13/22 1351  TempSrc:   PainSc: 5                  Ronit Cranfield

## 2022-10-13 NOTE — Op Note (Signed)
[facility] Patient Name: Melanie Shepard Procedure Date: 10/13/2022 1:45 PM MRN: [id number] Date of Birth: [birth year] Attending MD: Maylon Peppers , , [id number] CSN: [id number] Age: 56 Admit Type: Inpatient Procedure:                Upper GI endoscopy Indications:              Abdominal pain, Dysphagia, Nausea with vomiting Providers:                Maylon Peppers, Caprice Kluver, Raphael Gibney,                            Technician Referring MD:              Medicines:                Monitored Anesthesia Care Complications:            No immediate complications. Estimated Blood Loss:     Estimated blood loss: none. Procedure:                Pre-Anesthesia Assessment:                           - Prior to the procedure, a History and Physical                            was performed, and patient medications, allergies                            and sensitivities were reviewed. The patient's                            tolerance of previous anesthesia was reviewed.                           - The risks and benefits of the procedure and the                            sedation options and risks were discussed with the                            patient. All questions were answered and informed                            consent was obtained.                           - ASA Grade Assessment: II - A patient with mild                            systemic disease.                           After obtaining informed consent, the endoscope was                            passed under direct vision. Throughout the  procedure, the patient's blood pressure, pulse, and                            oxygen saturations were monitored continuously. The                            [id number]-H190 ([id number]) scope was introduced through the                            mouth, and advanced to the second part of duodenum.                            The upper GI endoscopy was accomplished  without                            difficulty. The patient tolerated the procedure                            well. Scope In: 1:56:18 PM Scope Out: 2:10:15 PM Total Procedure Duration: 0 hours 13 minutes 57 seconds  Findings:      Abnormal motility was noted in the esophagus. There is primary       peristalsis of the esophageal body. The distal esophagus/lower       esophageal sphincter is open. Biopsies were obtained from the proximal       and distal esophagus with cold forceps for histology of eosinophilic       esophagitis. A guidewire was placed and the scope was withdrawn.       Dilation was performed with a Savary dilator with no resistance at 18       mm. No mucosal disruption was seen upon reinspection.      Localized mild inflammation characterized by erythema was found in the       gastric body and in the gastric antrum. Biopsies were taken with a cold       forceps for histology.      Patchy mild inflammation characterized by congestion (edema) was found       in the first portion of the duodenum. Impression:               - Abnormal esophageal motility. Dilated.                           - Gastritis. Biopsied.                           - Duodenitis.                           - Biopsies were taken with a cold forceps for                            evaluation of eosinophilic esophagitis. Moderate Sedation:      Per Anesthesia Care Recommendation:           - Return patient to hospital ward for ongoing care.                           -  Advance diet as tolerated.                           - Continue combination of phenergan and Zofran for                            nausea control.                           - Await pathology results. Procedure Code(s):        --- Professional ---                           [phone], Esophagogastroduodenoscopy, flexible,                            transoral; with insertion of guide wire followed by                            passage of dilator(s)  through esophagus over guide                            wire                           43239, 59, Esophagogastroduodenoscopy, flexible,                            transoral; with biopsy, single or multiple Diagnosis Code(s):        --- Professional ---                           K22.4, Dyskinesia of esophagus                           K29.70, Gastritis, unspecified, without bleeding                           K29.80, Duodenitis without bleeding                           R10.9, Unspecified abdominal pain                           R13.10, Dysphagia, unspecified                           R11.2, Nausea with vomiting, unspecified CPT copyright 2022 American Medical Association. All rights reserved. The codes documented in this report are preliminary and upon coder review may  be revised to meet current compliance requirements. Maylon Peppers, MD Maylon Peppers,  10/13/2022 2:21:02 PM This report has been signed electronically. Number of Addenda: 0

## 2022-10-14 DIAGNOSIS — R112 Nausea with vomiting, unspecified: Secondary | ICD-10-CM | POA: Diagnosis not present

## 2022-10-14 LAB — CBC
HCT: 36.7 % (ref 36.0–46.0)
Hemoglobin: 12.2 g/dL (ref 12.0–15.0)
MCH: 35.2 pg — ABNORMAL HIGH (ref 26.0–34.0)
MCHC: 33.2 g/dL (ref 30.0–36.0)
MCV: 105.8 fL — ABNORMAL HIGH (ref 80.0–100.0)
Platelets: 152 10*3/uL (ref 150–400)
RBC: 3.47 MIL/uL — ABNORMAL LOW (ref 3.87–5.11)
RDW: 13.6 % (ref 11.5–15.5)
WBC: 8.1 10*3/uL (ref 4.0–10.5)
nRBC: 0 % (ref 0.0–0.2)

## 2022-10-14 LAB — COMPREHENSIVE METABOLIC PANEL
ALT: 28 U/L (ref 0–44)
AST: 30 U/L (ref 15–41)
Albumin: 3.6 g/dL (ref 3.5–5.0)
Alkaline Phosphatase: 105 U/L (ref 38–126)
Anion gap: 9 (ref 5–15)
BUN: 8 mg/dL (ref 6–20)
CO2: 25 mmol/L (ref 22–32)
Calcium: 8.8 mg/dL — ABNORMAL LOW (ref 8.9–10.3)
Chloride: 103 mmol/L (ref 98–111)
Creatinine, Ser: 0.65 mg/dL (ref 0.44–1.00)
GFR, Estimated: >60 mL/min (ref >60)
Glucose, Bld: 147 mg/dL — ABNORMAL HIGH (ref 70–99)
Potassium: 3.5 mmol/L (ref 3.5–5.1)
Sodium: 137 mmol/L (ref 135–145)
Total Bilirubin: 0.8 mg/dL (ref 0.3–1.2)
Total Protein: 6.5 g/dL (ref 6.5–8.1)

## 2022-10-14 LAB — IGG, IGA, IGM
IgA: 156 mg/dL (ref 87–352)
IgG (Immunoglobin G), Serum: 863 mg/dL (ref 586–1602)
IgM (Immunoglobulin M), Srm: 159 mg/dL (ref 26–217)

## 2022-10-14 LAB — MAGNESIUM: Magnesium: 1.9 mg/dL (ref 1.7–2.4)

## 2022-10-14 LAB — GLUCOSE, CAPILLARY: Glucose-Capillary: 142 mg/dL — ABNORMAL HIGH (ref 70–99)

## 2022-10-14 MED ORDER — IBUPROFEN 400 MG PO TABS
400.0000 mg | ORAL_TABLET | Freq: Once | ORAL | Status: AC
  Administered 2022-10-14: 400 mg via ORAL
  Filled 2022-10-14: qty 1

## 2022-10-14 NOTE — Discharge Summary (Signed)
Physician Discharge Summary  Melanie Shepard:[id number] DOB: 23-Nov-1966 DOA: 10/12/2022  PCP: Moshe Cipro, MD  Admit date: 10/12/2022  Discharge date: 10/14/2022  Admitted From:Home  Disposition:  Home  Recommendations for Outpatient Follow-up:  Follow up with PCP in 1-2 weeks Follow-up with gastroenterology which will be scheduled in 3-4 weeks with Dr. Gala Romney and may require esophageal manometry at some point Continues Zofran and Phenergan as needed for nausea and continue PPI twice daily as previously ordered Continue other home medications as prior  Home Health: None  Equipment/Devices: None  Discharge Condition:Stable  CODE STATUS: Full  Diet recommendation: Heart Healthy/carb modified  Brief/Interim Summary:  Melanie Shepard is a 56 y.o. female with medical history significant of type 2 diabetes mellitus, hypothyroidism, hyperlipidemia, anxiety and tobacco use who presents to the emergency department due to nausea and vomiting which started yesterday around 6 PM.  She is noted to have intractable nausea, vomiting, and abdominal pain and this appears to be a longstanding problem with multiple prior procedures for investigation.  She has been seen by gastroenterology and underwent EGD with biopsy 11/3.  There was some concern for possible esophageal dysmotility changes and an empiric esophageal dilation was performed.  There was also some noted mild gastritis and duodenitis.  After the procedure, she was tolerating diet well and had minimal abdominal pain or nausea or vomiting.  She is now in stable condition for discharge and will follow-up with GI as noted above.  Discharge Diagnoses:  Principal Problem:   Intractable nausea and vomiting Active Problems:   Anxiety disorder   Abdominal pain, epigastric   DM type 2 (diabetes mellitus, type 2) (HCC)   Tobacco abuse   Elevated MCV   Pseudohyponatremia   Hashimoto's thyroiditis   Hepatic steatosis   Esophageal  dysphagia  Principal discharge diagnosis: Intractable nausea and vomiting with dysphagia status post empiric dilation with EGD 11/3.  Discharge Instructions  Discharge Instructions     Diet - low sodium heart healthy   Complete by: As directed    Increase activity slowly   Complete by: As directed       Allergies as of 10/14/2022       Reactions   Cymbalta [duloxetine Hcl] Other (See Comments)   Makes my head do crazy things   Ivp Dye [iodinated Contrast Media] Swelling   Facial swelling, pt tried premedication previously still had a reaction.    Reglan [metoclopramide]    Trazodone And Nefazodone    Gives nightmares   Adhesive [tape] Swelling, Rash   Tolerates paper tape        Medication List     TAKE these medications    ALPRAZolam 0.5 MG tablet Commonly known as: XANAX Take 0.5 mg by mouth in the morning and at bedtime.   budesonide-formoterol 160-4.5 MCG/ACT inhaler Commonly known as: SYMBICORT Inhale 2 puffs into the lungs 2 (two) times daily.   dicyclomine 20 MG tablet Commonly known as: BENTYL Take 20 mg by mouth every 6 (six) hours as needed.   MELATONIN GUMMIES PO Take 3-4 capsules by mouth at bedtime.   ondansetron 4 MG tablet Commonly known as: ZOFRAN TAKE 1 TABLET(4 MG) BY MOUTH EVERY 8 HOURS AS NEEDED FOR NAUSEA OR VOMITING What changed: See the new instructions.   pantoprazole 40 MG tablet Commonly known as: PROTONIX TAKE 1 TABLET(40 MG) BY MOUTH TWICE DAILY BEFORE A MEAL What changed:  how much to take how to take this when to take this additional instructions  Praluent 75 MG/ML Soaj Generic drug: Alirocumab Inject 1 mL into the skin every 14 (fourteen) days.   promethazine 25 MG tablet Commonly known as: PHENERGAN Take 0.5-1 tablets (12.5-25 mg total) by mouth every 6 (six) hours as needed for nausea or vomiting.   Synthroid 137 MCG tablet Generic drug: levothyroxine Take 137 mcg by mouth daily.   Vitamin D (Ergocalciferol)  1.25 MG (50000 UNIT) Caps capsule Commonly known as: DRISDOL Take 50,000 Units by mouth 2 (two) times a week.        Follow-up Information     Moshe Cipro, MD. Schedule an appointment as soon as possible for a visit in 1 week(s).   Specialty: Internal Medicine Contact information: [place] [address] [phone]         Harvel Quale, MD. Go to.   Specialty: Gastroenterology Contact information: [address] [place] [address] [phone]                Allergies  Allergen Reactions   Cymbalta [Duloxetine Hcl] Other (See Comments)    Makes my head do crazy things   Ivp Dye [Iodinated Contrast Media] Swelling    Facial swelling, pt tried premedication previously still had a reaction.    Reglan [Metoclopramide]    Trazodone And Nefazodone     Gives nightmares   Adhesive [Tape] Swelling and Rash    Tolerates paper tape    Consultations: GI   Procedures/Studies: CT ABDOMEN PELVIS WO CONTRAST  Result Date: 10/12/2022 CLINICAL DATA:  Abdominal pain, acute, nonlocalized EXAM: CT ABDOMEN AND PELVIS WITHOUT CONTRAST TECHNIQUE: Multidetector CT imaging of the abdomen and pelvis was performed following the standard protocol without IV contrast. RADIATION DOSE REDUCTION: This exam was performed according to the departmental dose-optimization program which includes automated exposure control, adjustment of the mA and/or kV according to patient size and/or use of iterative reconstruction technique. COMPARISON:  None Available. FINDINGS: Lower chest: No acute abnormality. Hepatobiliary: Severe hepatic steatosis. No enhancing intrahepatic mass. No intra or extrahepatic biliary ductal dilation. Status post cholecystectomy. Pancreas: Unremarkable Spleen: Unremarkable Adrenals/Urinary Tract: Adrenal glands are unremarkable. Kidneys are normal, without renal calculi, focal lesion, or hydronephrosis. Bladder is unremarkable.  Stomach/Bowel: Moderate colonic stool burden. Stomach, small bowel, and large bowel are otherwise unremarkable. No evidence of obstruction or focal inflammation. No free intraperitoneal gas or fluid. Appendix absent. Vascular/Lymphatic: Aortic atherosclerosis. No enlarged abdominal or pelvic lymph nodes. Reproductive: Status post hysterectomy. No adnexal masses. Other: No abdominal wall hernia or abnormality. No abdominopelvic ascites. Musculoskeletal: No acute or significant osseous findings. IMPRESSION: 1. No acute intra-abdominal pathology identified. No definite radiographic explanation for the patient's reported symptoms. 2. Severe hepatic steatosis. 3. Moderate colonic stool burden. 4. Aortic atherosclerosis. Aortic Atherosclerosis (ICD10-I70.0). Electronically Signed   By: Fidela Salisbury M.D.   On: 10/12/2022 04:09     Discharge Exam: Vitals:   10/14/22 0555 10/14/22 0802  BP: 100/68   Pulse:    Resp:    Temp:    SpO2:  92%   Vitals:   10/13/22 2100 10/14/22 0550 10/14/22 0555 10/14/22 0802  BP: 114/71 (!) 81/46 100/68   Pulse: 86 68    Resp: 18 18    Temp: 98.2 F (36.8 C) 98 F (36.7 C)    TempSrc: Oral Oral    SpO2: 96% 92%  92%  Weight:      Height:        General: Pt is alert, awake,  not in acute distress Cardiovascular: RRR, S1/S2 +, no rubs, no gallops Respiratory: CTA bilaterally, no wheezing, no rhonchi Abdominal: Soft, NT, ND, bowel sounds + Extremities: no edema, no cyanosis    The results of significant diagnostics from this hospitalization (including imaging, microbiology, ancillary and laboratory) are listed below for reference.     Microbiology: No results found for this or any previous visit (from the past 240 hour(s)).   Labs: BNP (last 3 results) No results for input(s): "BNP" in the last 8760 hours. Basic Metabolic Panel: Recent Labs  Lab 10/12/22 0238 10/13/22 0447 10/14/22 0359  NA 134* 141 137  K 3.8 3.7 3.5  CL 100 107 103  CO2 '23 25  25  '$ GLUCOSE 296* 136* 147*  BUN '8 7 8  '$ CREATININE 0.77 0.65 0.65  CALCIUM 9.2 9.0 8.8*  MG  --  1.9 1.9  PHOS  --  4.4  --    Liver Function Tests: Recent Labs  Lab 10/12/22 0238 10/13/22 0447 10/14/22 0359  AST '30 20 30  '$ ALT 39 27 28  ALKPHOS 137* 107 105  BILITOT 0.9 1.1 0.8  PROT 8.0 6.4* 6.5  ALBUMIN 4.3 3.6 3.6   Recent Labs  Lab 10/12/22 0238  LIPASE 33   No results for input(s): "AMMONIA" in the last 168 hours. CBC: Recent Labs  Lab 10/12/22 0238 10/13/22 0447 10/14/22 0359  WBC 12.6* 9.5 8.1  NEUTROABS 8.1*  --   --   HGB 14.0 12.6 12.2  HCT 40.9 37.4 36.7  MCV 102.8* 104.5* 105.8*  PLT 256 209 152   Cardiac Enzymes: No results for input(s): "CKTOTAL", "CKMB", "CKMBINDEX", "TROPONINI" in the last 168 hours. BNP: Invalid input(s): "POCBNP" CBG: Recent Labs  Lab 10/13/22 0756 10/13/22 1154 10/13/22 1644 10/13/22 2152 10/14/22 0737  GLUCAP 135* 128* 182* 143* 142*   D-Dimer No results for input(s): "DDIMER" in the last 72 hours. Hgb A1c No results for input(s): "HGBA1C" in the last 72 hours. Lipid Profile No results for input(s): "CHOL", "HDL", "LDLCALC", "TRIG", "CHOLHDL", "LDLDIRECT" in the last 72 hours. Thyroid function studies No results for input(s): "TSH", "T4TOTAL", "T3FREE", "THYROIDAB" in the last 72 hours.  Invalid input(s): "FREET3" Anemia work up Recent Labs    10/12/22 0238 10/12/22 1101  VITAMINB12 313  --   FOLATE 3.9*  --   FERRITIN  --  73  TIBC  --  251  IRON  --  126   Urinalysis    Component Value Date/Time   COLORURINE YELLOW 08/16/2022 1526   APPEARANCEUR HAZY (A) 08/16/2022 1526   LABSPEC 1.019 08/16/2022 1526   PHURINE 7.0 08/16/2022 1526   GLUCOSEU NEGATIVE 08/16/2022 1526   HGBUR NEGATIVE 08/16/2022 [place] 08/16/2022 1526   BILIRUBINUR positive 09/09/2013 1513   KETONESUR 5 (A) 08/16/2022 1526   PROTEINUR 30 (A) 08/16/2022 1526   UROBILINOGEN 0.2 09/11/2013 1946   NITRITE  NEGATIVE 08/16/2022 1526   LEUKOCYTESUR MODERATE (A) 08/16/2022 1526   Sepsis Labs Recent Labs  Lab 10/12/22 0238 10/13/22 0447 10/14/22 0359  WBC 12.6* 9.5 8.1   Microbiology No results found for this or any previous visit (from the past 240 hour(s)).   Time coordinating discharge: 35 minutes  SIGNED:   Rodena Goldmann, DO [facility] 10/14/2022, 9:43 AM  If 7PM-7AM, please contact night-coverage [email or web address]
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
## 2022-10-15 LAB — ANTI-SMOOTH MUSCLE ANTIBODY, IGG: F-Actin IgG: 4 Units (ref 0–19)

## 2022-10-16 NOTE — Progress Notes (Signed)
AC Tanya informed CSW that pts insurance was on the phone requesting the hospital NPI in order to process pts paperwork for hospital admission. CSW provided NPI to insurance.

## 2022-10-17 LAB — SURGICAL PATHOLOGY

## 2022-10-18 ENCOUNTER — Other Ambulatory Visit (INDEPENDENT_AMBULATORY_CARE_PROVIDER_SITE_OTHER): Payer: Self-pay | Admitting: Gastroenterology

## 2022-10-18 DIAGNOSIS — B3781 Candidal esophagitis: Secondary | ICD-10-CM

## 2022-10-18 MED ORDER — FLUCONAZOLE 150 MG PO TABS: 150.0000 mg | ORAL_TABLET | Freq: Every day | ORAL | 0 refills | Status: AC

## 2022-10-19 ENCOUNTER — Encounter (INDEPENDENT_AMBULATORY_CARE_PROVIDER_SITE_OTHER): Payer: Self-pay | Admitting: *Deleted

## 2022-10-19 NOTE — Progress Notes (Signed)
Result letter mailed to patient

## 2022-10-20 ENCOUNTER — Encounter (HOSPITAL_COMMUNITY): Payer: Self-pay | Admitting: Gastroenterology

## 2022-10-23 DIAGNOSIS — M62830 Muscle spasm of back: Secondary | ICD-10-CM | POA: Insufficient documentation

## 2022-10-23 DIAGNOSIS — K922 Gastrointestinal hemorrhage, unspecified: Secondary | ICD-10-CM | POA: Insufficient documentation

## 2022-11-12 NOTE — Patient Instructions (Incomplete)
You may continue pantoprazole 40 mg twice daily, if you would like to wean the medication you may decrease to once daily.  Instructions for fatty liver: Recommend 1-2# weight loss per week until ideal body weight through exercise & diet. Low fat/cholesterol diet.   Avoid sweets, sodas, fruit juices, sweetened beverages like tea, etc. Gradually increase exercise from 15 min daily up to 1 hr per day 5 days/week. Limit alcohol use.  Avoid all NSAIDs (Aleve, ibuprofen, Advil), meloxicam, Celebrex, BC or Goody powders.  Continue Zofran and Phenergan as needed for nausea.  Your most recent lab work was stable including her hemoglobin.  I would recommend a visit with neurology as previously recommended by Dr. Levon Hedger for evaluation of her nausea given your history of brain injury given extensive workup for nausea has been unremarkable.  It was a pleasure to see you today. I want to create trusting relationships with patients. If you receive a survey regarding your visit,  I greatly appreciate you taking time to fill this out on paper or through your MyChart. I value your feedback.  Brooke Bonito, MSN, FNP-BC, AGACNP-BC [facility]
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
## 2022-11-13 ENCOUNTER — Ambulatory Visit (INDEPENDENT_AMBULATORY_CARE_PROVIDER_SITE_OTHER): Payer: Medicare (Managed Care) | Admitting: Gastroenterology

## 2022-11-13 ENCOUNTER — Encounter: Payer: Self-pay | Admitting: Gastroenterology

## 2022-11-13 VITALS — BP 84/61 | HR 85 | Temp 97.7°F | Ht 67.5 in | Wt 149.4 lb

## 2022-11-13 DIAGNOSIS — B3781 Candidal esophagitis: Secondary | ICD-10-CM

## 2022-11-13 DIAGNOSIS — K760 Fatty (change of) liver, not elsewhere classified: Secondary | ICD-10-CM

## 2022-11-13 DIAGNOSIS — R112 Nausea with vomiting, unspecified: Secondary | ICD-10-CM

## 2022-11-13 MED ORDER — PROMETHAZINE HCL 25 MG PO TABS: 12.5000 mg | ORAL_TABLET | Freq: Four times a day (QID) | ORAL | 0 refills | Status: DC | PRN

## 2022-11-30 DIAGNOSIS — Z1211 Encounter for screening for malignant neoplasm of colon: Secondary | ICD-10-CM | POA: Insufficient documentation

## 2022-12-12 DIAGNOSIS — M778 Other enthesopathies, not elsewhere classified: Secondary | ICD-10-CM | POA: Insufficient documentation

## 2022-12-12 DIAGNOSIS — D7589 Other specified diseases of blood and blood-forming organs: Secondary | ICD-10-CM | POA: Insufficient documentation

## 2022-12-12 DIAGNOSIS — M7580 Other shoulder lesions, unspecified shoulder: Secondary | ICD-10-CM | POA: Insufficient documentation

## 2023-02-08 DIAGNOSIS — M7501 Adhesive capsulitis of right shoulder: Secondary | ICD-10-CM | POA: Insufficient documentation

## 2023-04-03 DIAGNOSIS — K029 Dental caries, unspecified: Secondary | ICD-10-CM | POA: Insufficient documentation

## 2023-04-03 DIAGNOSIS — K047 Periapical abscess without sinus: Secondary | ICD-10-CM | POA: Insufficient documentation

## 2023-05-01 DIAGNOSIS — D72829 Elevated white blood cell count, unspecified: Secondary | ICD-10-CM | POA: Diagnosis present

## 2023-05-10 ENCOUNTER — Encounter: Payer: Self-pay | Admitting: Gastroenterology

## 2023-05-17 ENCOUNTER — Encounter (HOSPITAL_COMMUNITY): Payer: Self-pay

## 2023-05-17 ENCOUNTER — Inpatient Hospital Stay (HOSPITAL_COMMUNITY)
Admission: EM | Admit: 2023-05-17 | Discharge: 2023-05-23 | DRG: 102 | Disposition: A | Discharge status: Home / Self Care | Payer: Medicare Other | Attending: Internal Medicine | Admitting: Internal Medicine

## 2023-05-17 ENCOUNTER — Other Ambulatory Visit: Payer: Self-pay

## 2023-05-17 DIAGNOSIS — F411 Generalized anxiety disorder: Secondary | ICD-10-CM | POA: Diagnosis present

## 2023-05-17 DIAGNOSIS — G929 Unspecified toxic encephalopathy: Secondary | ICD-10-CM

## 2023-05-17 DIAGNOSIS — M542 Cervicalgia: Secondary | ICD-10-CM | POA: Diagnosis present

## 2023-05-17 DIAGNOSIS — Z90710 Acquired absence of both cervix and uterus: Secondary | ICD-10-CM

## 2023-05-17 DIAGNOSIS — F17200 Nicotine dependence, unspecified, uncomplicated: Secondary | ICD-10-CM

## 2023-05-17 DIAGNOSIS — K760 Fatty (change of) liver, not elsewhere classified: Secondary | ICD-10-CM | POA: Diagnosis present

## 2023-05-17 DIAGNOSIS — Z91048 Other nonmedicinal substance allergy status: Secondary | ICD-10-CM

## 2023-05-17 DIAGNOSIS — E876 Hypokalemia: Secondary | ICD-10-CM | POA: Diagnosis present

## 2023-05-17 DIAGNOSIS — R112 Nausea with vomiting, unspecified: Secondary | ICD-10-CM | POA: Diagnosis not present

## 2023-05-17 DIAGNOSIS — Z881 Allergy status to other antibiotic agents status: Secondary | ICD-10-CM

## 2023-05-17 DIAGNOSIS — G3184 Mild cognitive impairment, so stated: Secondary | ICD-10-CM | POA: Diagnosis present

## 2023-05-17 DIAGNOSIS — K769 Liver disease, unspecified: Secondary | ICD-10-CM | POA: Insufficient documentation

## 2023-05-17 DIAGNOSIS — Z87820 Personal history of traumatic brain injury: Secondary | ICD-10-CM

## 2023-05-17 DIAGNOSIS — Z720 Tobacco use: Secondary | ICD-10-CM | POA: Diagnosis present

## 2023-05-17 DIAGNOSIS — E785 Hyperlipidemia, unspecified: Secondary | ICD-10-CM | POA: Diagnosis present

## 2023-05-17 DIAGNOSIS — G43119 Migraine with aura, intractable, without status migrainosus: Principal | ICD-10-CM | POA: Diagnosis present

## 2023-05-17 DIAGNOSIS — G43109 Migraine with aura, not intractable, without status migrainosus: Secondary | ICD-10-CM

## 2023-05-17 DIAGNOSIS — R519 Headache, unspecified: Secondary | ICD-10-CM | POA: Insufficient documentation

## 2023-05-17 DIAGNOSIS — K219 Gastro-esophageal reflux disease without esophagitis: Secondary | ICD-10-CM | POA: Diagnosis present

## 2023-05-17 DIAGNOSIS — G928 Other toxic encephalopathy: Secondary | ICD-10-CM | POA: Diagnosis not present

## 2023-05-17 DIAGNOSIS — I2510 Atherosclerotic heart disease of native coronary artery without angina pectoris: Secondary | ICD-10-CM | POA: Diagnosis present

## 2023-05-17 DIAGNOSIS — Z79890 Hormone replacement therapy (postmenopausal): Secondary | ICD-10-CM

## 2023-05-17 DIAGNOSIS — Z9049 Acquired absence of other specified parts of digestive tract: Secondary | ICD-10-CM

## 2023-05-17 DIAGNOSIS — F4320 Adjustment disorder, unspecified: Secondary | ICD-10-CM | POA: Insufficient documentation

## 2023-05-17 DIAGNOSIS — Z8249 Family history of ischemic heart disease and other diseases of the circulatory system: Secondary | ICD-10-CM

## 2023-05-17 DIAGNOSIS — Z91041 Radiographic dye allergy status: Secondary | ICD-10-CM

## 2023-05-17 DIAGNOSIS — M1611 Unilateral primary osteoarthritis, right hip: Secondary | ICD-10-CM | POA: Diagnosis present

## 2023-05-17 DIAGNOSIS — F5105 Insomnia due to other mental disorder: Secondary | ICD-10-CM | POA: Insufficient documentation

## 2023-05-17 DIAGNOSIS — E538 Deficiency of other specified B group vitamins: Secondary | ICD-10-CM

## 2023-05-17 DIAGNOSIS — F17210 Nicotine dependence, cigarettes, uncomplicated: Secondary | ICD-10-CM | POA: Diagnosis present

## 2023-05-17 DIAGNOSIS — F419 Anxiety disorder, unspecified: Secondary | ICD-10-CM | POA: Diagnosis present

## 2023-05-17 DIAGNOSIS — I10 Essential (primary) hypertension: Secondary | ICD-10-CM | POA: Diagnosis present

## 2023-05-17 DIAGNOSIS — E119 Type 2 diabetes mellitus without complications: Secondary | ICD-10-CM

## 2023-05-17 DIAGNOSIS — R799 Abnormal finding of blood chemistry, unspecified: Secondary | ICD-10-CM | POA: Insufficient documentation

## 2023-05-17 DIAGNOSIS — G43A1 Cyclical vomiting, intractable: Secondary | ICD-10-CM | POA: Diagnosis present

## 2023-05-17 DIAGNOSIS — K759 Inflammatory liver disease, unspecified: Secondary | ICD-10-CM | POA: Insufficient documentation

## 2023-05-17 DIAGNOSIS — D72829 Elevated white blood cell count, unspecified: Secondary | ICD-10-CM | POA: Diagnosis present

## 2023-05-17 DIAGNOSIS — Z7951 Long term (current) use of inhaled steroids: Secondary | ICD-10-CM

## 2023-05-17 DIAGNOSIS — Z79899 Other long term (current) drug therapy: Secondary | ICD-10-CM

## 2023-05-17 DIAGNOSIS — R451 Restlessness and agitation: Secondary | ICD-10-CM | POA: Diagnosis not present

## 2023-05-17 DIAGNOSIS — E063 Autoimmune thyroiditis: Secondary | ICD-10-CM | POA: Diagnosis present

## 2023-05-17 DIAGNOSIS — Z716 Tobacco abuse counseling: Secondary | ICD-10-CM

## 2023-05-17 DIAGNOSIS — Z888 Allergy status to other drugs, medicaments and biological substances status: Secondary | ICD-10-CM

## 2023-05-17 DIAGNOSIS — R410 Disorientation, unspecified: Secondary | ICD-10-CM

## 2023-05-17 DIAGNOSIS — G4700 Insomnia, unspecified: Secondary | ICD-10-CM | POA: Diagnosis present

## 2023-05-17 HISTORY — DX: Mild cognitive impairment of uncertain or unknown etiology: G31.84

## 2023-05-17 LAB — COMPREHENSIVE METABOLIC PANEL
ALT: 17 U/L (ref 0–44)
AST: 28 U/L (ref 15–41)
Albumin: 4.3 g/dL (ref 3.5–5.0)
Alkaline Phosphatase: 127 U/L — ABNORMAL HIGH (ref 38–126)
Anion gap: 12 (ref 5–15)
BUN: 10 mg/dL (ref 6–20)
CO2: 27 mmol/L (ref 22–32)
Calcium: 9.6 mg/dL (ref 8.9–10.3)
Chloride: 101 mmol/L (ref 98–111)
Creatinine, Ser: 0.82 mg/dL (ref 0.44–1.00)
GFR, Estimated: >60 mL/min (ref >60)
Glucose, Bld: 149 mg/dL — ABNORMAL HIGH (ref 70–99)
Potassium: 3.5 mmol/L (ref 3.5–5.1)
Sodium: 140 mmol/L (ref 135–145)
Total Bilirubin: 0.6 mg/dL (ref 0.3–1.2)
Total Protein: 8.3 g/dL — ABNORMAL HIGH (ref 6.5–8.1)

## 2023-05-17 LAB — CBC WITH DIFFERENTIAL/PLATELET
Abs Immature Granulocytes: 0.07 10*3/uL (ref 0.00–0.07)
Basophils Absolute: 0.1 10*3/uL (ref 0.0–0.1)
Basophils Relative: 1 %
Eosinophils Absolute: 0.2 10*3/uL (ref 0.0–0.5)
Eosinophils Relative: 2 %
HCT: 43.4 % (ref 36.0–46.0)
Hemoglobin: 14.6 g/dL (ref 12.0–15.0)
Immature Granulocytes: 1 %
Lymphocytes Relative: 27 %
Lymphs Abs: 3.2 10*3/uL (ref 0.7–4.0)
MCH: 33.6 pg (ref 26.0–34.0)
MCHC: 33.6 g/dL (ref 30.0–36.0)
MCV: 99.8 fL (ref 80.0–100.0)
Monocytes Absolute: 0.4 10*3/uL (ref 0.1–1.0)
Monocytes Relative: 3 %
Neutro Abs: 8.1 10*3/uL — ABNORMAL HIGH (ref 1.7–7.7)
Neutrophils Relative %: 66 %
Platelets: 264 10*3/uL (ref 150–400)
RBC: 4.35 MIL/uL (ref 3.87–5.11)
RDW: 13.9 % (ref 11.5–15.5)
WBC: 12 10*3/uL — ABNORMAL HIGH (ref 4.0–10.5)
nRBC: 0 % (ref 0.0–0.2)

## 2023-05-17 LAB — LIPASE, BLOOD: Lipase: 36 U/L (ref 11–51)

## 2023-05-17 MED ORDER — MORPHINE SULFATE (PF) 4 MG/ML IV SOLN
4.0000 mg | Freq: Once | INTRAVENOUS | Status: AC
  Administered 2023-05-17: 4 mg via INTRAVENOUS
  Filled 2023-05-17: qty 1

## 2023-05-17 MED ORDER — SODIUM CHLORIDE 0.9 % IV SOLN
25.0000 mg | Freq: Once | INTRAVENOUS | Status: AC
  Administered 2023-05-18: 25 mg via INTRAVENOUS
  Filled 2023-05-17: qty 1

## 2023-05-17 MED ORDER — ONDANSETRON HCL 4 MG/2ML IJ SOLN
4.0000 mg | Freq: Once | INTRAMUSCULAR | Status: AC
  Administered 2023-05-17: 4 mg via INTRAVENOUS
  Filled 2023-05-17: qty 2

## 2023-05-17 MED ORDER — SODIUM CHLORIDE 0.9 % IV BOLUS
1000.0000 mL | Freq: Once | INTRAVENOUS | Status: AC
  Administered 2023-05-17: 1000 mL via INTRAVENOUS

## 2023-05-17 MED ORDER — DIPHENHYDRAMINE HCL 50 MG/ML IJ SOLN
25.0000 mg | Freq: Once | INTRAMUSCULAR | Status: AC
  Administered 2023-05-18: 25 mg via INTRAVENOUS
  Filled 2023-05-17: qty 1

## 2023-05-17 MED ORDER — DROPERIDOL 2.5 MG/ML IJ SOLN
2.5000 mg | Freq: Once | INTRAMUSCULAR | Status: AC
  Administered 2023-05-17: 2.5 mg via INTRAVENOUS
  Filled 2023-05-17: qty 2

## 2023-05-17 NOTE — ED Triage Notes (Signed)
Pt from home with spouse with c/o emesis, headache and chills that started 4 hours ago  Pt actively vomiting bile in triage.

## 2023-05-17 NOTE — ED Provider Notes (Signed)
  Provider Note MRN:  [id number]  Arrival date & time: 05/18/23    ED Course and Medical Decision Making  Assumed care from PA Crowell at shift change.  Persistent nausea and vomiting, history of same without obvious pathology, attempting symptomatic control will consider CT imaging given no imaging since Nov 2023  1:45 AM update: Patient having persistent symptoms despite multiple efforts to control.  CT imaging is without acute process.  Will request hospitalist admission for intractable nausea vomiting. Ultrasound ED Peripheral IV (Provider)  Date/Time: 05/18/2023 1:58 AM  Performed by: Sabas Sous, MD Authorized by: Sabas Sous, MD   Procedure details:    Indications: poor IV access     Skin Prep: chlorhexidine gluconate     Location: Right upper arm.   Angiocath:  20 G   Bedside Ultrasound Guided: Yes     Patient tolerated procedure without complications: Yes     Dressing applied: Yes     Final Clinical Impressions(s) / ED Diagnoses     ICD-10-CM   1. Nausea and vomiting, unspecified vomiting type  R11.2       ED Discharge Orders     None       Discharge Instructions   None     Elmer Sow. Pilar Plate, MD [facility] [email or web address]    Sabas Sous, MD 05/18/23 [phone]

## 2023-05-17 NOTE — ED Provider Notes (Signed)
[place] EMERGENCY DEPARTMENT AT [facility] Provider Note   CSN: [id number] Arrival date & time: 05/17/23  1909     History Chief Complaint  Patient presents with   Emesis    Chills and headache    Melanie Shepard is a 57 y.o. female with history of anxiety, diabetes, GERD, hyperlipidemia who presents to the emergency department today for further evaluation of intractable nausea vomiting that started today.  Patient states she suffers from these episodes from time to time.  She denies any alcohol or drug use.  She states that last time she was here she had to be admitted for intractable nausea and vomiting.  Chart review revealed that this was true and happened back in November.  She endorses associated diffuse abdominal pain but denies fevers or chills.  She also endorses headache.   Emesis      Home Medications Prior to Admission medications   Medication Sig Start Date End Date Taking? Authorizing Provider  tiZANidine (ZANAFLEX) 4 MG tablet TAKE ONE TABLET BY MOUTH EVERY 6 TO 8 HOURS 05/17/23  Yes [provider]  ALPRAZolam (XANAX) 0.5 MG tablet Take 0.5 mg by mouth in the morning and at bedtime. 11/03/19   [provider]  amoxicillin (AMOXIL) 500 MG capsule     [provider]  budesonide-formoterol (SYMBICORT) 160-4.5 MCG/ACT inhaler Inhale 2 puffs into the lungs 2 (two) times daily. 05/10/22   [provider]  busPIRone (BUSPAR) 15 MG tablet Take 0.5 tablets by mouth 2 (two) times daily.    [provider]  Evolocumab (REPATHA SURECLICK) 140 MG/ML SOAJ INJECT 140mg  SUBCUTANEOUSLY EVERY TWO WEEKS AS DIRECTED    [provider]  ibuprofen (IBU) 800 MG tablet TAKE ONE TABLET BY MOUTH EVERY 4 TO 6 HOURS AS NEEDED FOR DISCOMFORT    [provider]  MELATONIN GUMMIES PO Take 3-4 capsules by mouth at bedtime.    [provider]  ondansetron (ZOFRAN) 4 MG tablet TAKE 1 TABLET(4 MG) BY MOUTH EVERY 8 HOURS AS  NEEDED FOR NAUSEA OR VOMITING Patient taking differently: Take 8 mg by mouth every 8 (eight) hours as needed for nausea or vomiting. 08/12/20   Tiffany Kocher, PA-C  ondansetron (ZOFRAN-ODT) 4 MG disintegrating tablet DISSOLVE 2 TABLETS UNDER THE TONGUE TWICE DAILY    [provider]  oxyCODONE (OXY IR/ROXICODONE) 5 MG immediate release tablet TAKE ONE TABLET BY MOUTH EVERY 4 TO 6 HOURS FOR SEVERE pain    [provider]  pantoprazole (PROTONIX) 40 MG tablet TAKE 1 TABLET(40 MG) BY MOUTH TWICE DAILY BEFORE A MEAL Patient taking differently: Take 40 mg by mouth 2 (two) times daily. 07/27/17   Tiffany Kocher, PA-C  pantoprazole (PROTONIX) 40 MG tablet Take 1 tablet by mouth 2 (two) times daily.    [provider]  PRALUENT 75 MG/ML SOAJ Inject 1 mL into the skin every 14 (fourteen) days. 07/31/22   [provider]  promethazine (PHENERGAN) 25 MG tablet Take 0.5-1 tablets (12.5-25 mg total) by mouth every 6 (six) hours as needed for nausea or vomiting. 11/13/22   Aida Raider, NP  SYNTHROID 137 MCG tablet Take 137 mcg by mouth daily. 08/15/22   [provider]  Vitamin D, Ergocalciferol, (DRISDOL) 50000 units CAPS capsule Take 50,000 Units by mouth 2 (two) times a week.  11/29/17   [provider]      Allergies    Atorvastatin, Cymbalta [duloxetine hcl], Iodinated contrast media, Metoclopramide, Rosuvastatin,  Trazodone and nefazodone, and Tape    Review of Systems   Review of Systems  Gastrointestinal:  Positive for vomiting.  All other systems reviewed and are negative.   Physical Exam Updated Vital Signs BP (!) 155/82 (BP Location: Right Arm)   Pulse 68   Temp 98.7 F (37.1 C) (Tympanic)   Resp 20   Ht 5\' 7"  (1.702 m)   Wt 67.8 kg   SpO2 96%   BMI 23.41 kg/m  Physical Exam Vitals and nursing note reviewed.  Constitutional:      General: She is not in acute distress.    Appearance: Normal appearance.  HENT:     Head:  Normocephalic and atraumatic.  Eyes:     General:        Right eye: No discharge.        Left eye: No discharge.  Cardiovascular:     Comments: Regular rate and rhythm.  S1/S2 are distinct without any evidence of murmur, rubs, or gallops.  Radial pulses are 2+ bilaterally.  Dorsalis pedis pulses are 2+ bilaterally.  No evidence of pedal edema. Pulmonary:     Comments: Clear to auscultation bilaterally.  Normal effort.  No respiratory distress.  No evidence of wheezes, rales, or rhonchi heard throughout. Abdominal:     General: Abdomen is flat. Bowel sounds are normal. There is no distension.     Tenderness: There is no abdominal tenderness. There is no guarding or rebound.  Musculoskeletal:        General: Normal range of motion.     Cervical back: Neck supple.  Skin:    General: Skin is warm and dry.     Findings: No rash.  Neurological:     General: No focal deficit present.     Mental Status: She is alert.  Psychiatric:        Mood and Affect: Mood normal.        Behavior: Behavior normal.     ED Results / Procedures / Treatments   Labs (all labs ordered are listed, but only abnormal results are displayed) Labs Reviewed  COMPREHENSIVE METABOLIC PANEL - Abnormal; Notable for the following components:      Result Value   Glucose, Bld 149 (*)    Total Protein 8.3 (*)    Alkaline Phosphatase 127 (*)    All other components within normal limits  CBC WITH DIFFERENTIAL/PLATELET - Abnormal; Notable for the following components:   WBC 12.0 (*)    Neutro Abs 8.1 (*)    All other components within normal limits  LIPASE, BLOOD  URINALYSIS, ROUTINE W REFLEX MICROSCOPIC    EKG None  Radiology No results found.  Procedures Procedures    Medications Ordered in ED Medications  ondansetron [facility]) injection 4 mg (4 mg Intravenous Given 05/17/23 2041)  morphine (PF) 4 MG/ML injection 4 mg (4 mg Intravenous Given 05/17/23 2041)    ED Course/ Medical Decision Making/ A&P   {    Click here for ABCD2, HEART and other calculators  Medical Decision Making [name] is a 57 y.o. female patient who presents to the emergency department today for further evaluation of abdominal pain and intractable nausea and vomiting.  I will plan to give the patient some pain medication, antiemetics, fluids and get some basic labs. Vital signs are normal at this time although patient does appear uncomfortable.  Patient is still in quite a bit of pain. Due to shift change the rest of her care will be  transferred to oncoming provider. If patient's symptoms can be managed she can likely be discharged. Final disposition pending.   Amount and/or Complexity of Data Reviewed Labs: ordered.  Risk Prescription drug management.    Final Clinical Impression(s) / ED Diagnoses Final diagnoses:  None    Rx / DC Orders ED Discharge Orders     None         Teressa Lower, [place] 05/17/23 2319    Loetta Rough, MD 05/19/23 [phone]

## 2023-05-17 NOTE — ED Notes (Signed)
Pt was able to urinate, but the amount was not enough for UA according to lab. Pt said she will try again later.

## 2023-05-17 NOTE — ED Notes (Signed)
Pt unable to give ua at this time, tried using bedpan, pt states she is too weak to get out of bed.

## 2023-05-18 ENCOUNTER — Emergency Department (HOSPITAL_COMMUNITY): Payer: Medicare Other

## 2023-05-18 ENCOUNTER — Encounter (HOSPITAL_COMMUNITY): Payer: Self-pay | Admitting: Internal Medicine

## 2023-05-18 DIAGNOSIS — E538 Deficiency of other specified B group vitamins: Secondary | ICD-10-CM | POA: Diagnosis present

## 2023-05-18 DIAGNOSIS — R112 Nausea with vomiting, unspecified: Secondary | ICD-10-CM

## 2023-05-18 DIAGNOSIS — R451 Restlessness and agitation: Secondary | ICD-10-CM | POA: Diagnosis not present

## 2023-05-18 DIAGNOSIS — F17210 Nicotine dependence, cigarettes, uncomplicated: Secondary | ICD-10-CM | POA: Diagnosis present

## 2023-05-18 DIAGNOSIS — Z720 Tobacco use: Secondary | ICD-10-CM | POA: Diagnosis not present

## 2023-05-18 DIAGNOSIS — E118 Type 2 diabetes mellitus with unspecified complications: Secondary | ICD-10-CM

## 2023-05-18 DIAGNOSIS — G43A1 Cyclical vomiting, intractable: Secondary | ICD-10-CM | POA: Diagnosis present

## 2023-05-18 DIAGNOSIS — R519 Headache, unspecified: Secondary | ICD-10-CM | POA: Diagnosis not present

## 2023-05-18 DIAGNOSIS — K219 Gastro-esophageal reflux disease without esophagitis: Secondary | ICD-10-CM | POA: Diagnosis present

## 2023-05-18 DIAGNOSIS — D72829 Elevated white blood cell count, unspecified: Secondary | ICD-10-CM

## 2023-05-18 DIAGNOSIS — R410 Disorientation, unspecified: Secondary | ICD-10-CM | POA: Diagnosis not present

## 2023-05-18 DIAGNOSIS — E785 Hyperlipidemia, unspecified: Secondary | ICD-10-CM | POA: Diagnosis present

## 2023-05-18 DIAGNOSIS — Z8249 Family history of ischemic heart disease and other diseases of the circulatory system: Secondary | ICD-10-CM | POA: Diagnosis not present

## 2023-05-18 DIAGNOSIS — E063 Autoimmune thyroiditis: Secondary | ICD-10-CM

## 2023-05-18 DIAGNOSIS — Z79890 Hormone replacement therapy (postmenopausal): Secondary | ICD-10-CM | POA: Diagnosis not present

## 2023-05-18 DIAGNOSIS — Z7951 Long term (current) use of inhaled steroids: Secondary | ICD-10-CM | POA: Diagnosis not present

## 2023-05-18 DIAGNOSIS — E119 Type 2 diabetes mellitus without complications: Secondary | ICD-10-CM | POA: Diagnosis present

## 2023-05-18 DIAGNOSIS — K760 Fatty (change of) liver, not elsewhere classified: Secondary | ICD-10-CM | POA: Diagnosis present

## 2023-05-18 DIAGNOSIS — G9340 Encephalopathy, unspecified: Secondary | ICD-10-CM | POA: Diagnosis not present

## 2023-05-18 DIAGNOSIS — M1611 Unilateral primary osteoarthritis, right hip: Secondary | ICD-10-CM | POA: Diagnosis present

## 2023-05-18 DIAGNOSIS — R569 Unspecified convulsions: Secondary | ICD-10-CM | POA: Diagnosis not present

## 2023-05-18 DIAGNOSIS — G4700 Insomnia, unspecified: Secondary | ICD-10-CM | POA: Diagnosis present

## 2023-05-18 DIAGNOSIS — G43809 Other migraine, not intractable, without status migrainosus: Secondary | ICD-10-CM | POA: Diagnosis not present

## 2023-05-18 DIAGNOSIS — G928 Other toxic encephalopathy: Secondary | ICD-10-CM | POA: Diagnosis not present

## 2023-05-18 DIAGNOSIS — G44209 Tension-type headache, unspecified, not intractable: Secondary | ICD-10-CM | POA: Diagnosis not present

## 2023-05-18 DIAGNOSIS — F411 Generalized anxiety disorder: Secondary | ICD-10-CM | POA: Diagnosis present

## 2023-05-18 DIAGNOSIS — F419 Anxiety disorder, unspecified: Secondary | ICD-10-CM

## 2023-05-18 DIAGNOSIS — I10 Essential (primary) hypertension: Secondary | ICD-10-CM | POA: Diagnosis present

## 2023-05-18 DIAGNOSIS — M542 Cervicalgia: Secondary | ICD-10-CM | POA: Diagnosis present

## 2023-05-18 DIAGNOSIS — I2510 Atherosclerotic heart disease of native coronary artery without angina pectoris: Secondary | ICD-10-CM | POA: Diagnosis present

## 2023-05-18 DIAGNOSIS — G43119 Migraine with aura, intractable, without status migrainosus: Secondary | ICD-10-CM | POA: Diagnosis present

## 2023-05-18 DIAGNOSIS — G3184 Mild cognitive impairment, so stated: Secondary | ICD-10-CM | POA: Diagnosis present

## 2023-05-18 DIAGNOSIS — E876 Hypokalemia: Secondary | ICD-10-CM | POA: Diagnosis present

## 2023-05-18 DIAGNOSIS — G929 Unspecified toxic encephalopathy: Secondary | ICD-10-CM | POA: Diagnosis not present

## 2023-05-18 LAB — COMPREHENSIVE METABOLIC PANEL
ALT: 17 U/L (ref 0–44)
AST: 29 U/L (ref 15–41)
Albumin: 3.8 g/dL (ref 3.5–5.0)
Alkaline Phosphatase: 106 U/L (ref 38–126)
Anion gap: 11 (ref 5–15)
BUN: 10 mg/dL (ref 6–20)
CO2: 23 mmol/L (ref 22–32)
Calcium: 8.3 mg/dL — ABNORMAL LOW (ref 8.9–10.3)
Chloride: 105 mmol/L (ref 98–111)
Creatinine, Ser: 0.8 mg/dL (ref 0.44–1.00)
GFR, Estimated: >60 mL/min (ref >60)
Glucose, Bld: 136 mg/dL — ABNORMAL HIGH (ref 70–99)
Potassium: 3.9 mmol/L (ref 3.5–5.1)
Sodium: 139 mmol/L (ref 135–145)
Total Bilirubin: 0.6 mg/dL (ref 0.3–1.2)
Total Protein: 7.1 g/dL (ref 6.5–8.1)

## 2023-05-18 LAB — RAPID URINE DRUG SCREEN, HOSP PERFORMED
Amphetamines: NOT DETECTED
Barbiturates: NOT DETECTED
Benzodiazepines: POSITIVE — AB
Cocaine: NOT DETECTED
Opiates: POSITIVE — AB
Tetrahydrocannabinol: NOT DETECTED

## 2023-05-18 LAB — URINALYSIS, ROUTINE W REFLEX MICROSCOPIC
Bilirubin Urine: NEGATIVE
Glucose, UA: NEGATIVE mg/dL
Hgb urine dipstick: NEGATIVE
Ketones, ur: NEGATIVE mg/dL
Nitrite: NEGATIVE
Protein, ur: NEGATIVE mg/dL
Specific Gravity, Urine: 1.013 (ref 1.005–1.030)
pH: 6 (ref 5.0–8.0)

## 2023-05-18 LAB — HEMOGLOBIN A1C
Hgb A1c MFr Bld: 6.6 % — ABNORMAL HIGH (ref 4.8–5.6)
Mean Plasma Glucose: 142.72 mg/dL

## 2023-05-18 LAB — MAGNESIUM: Magnesium: 1.8 mg/dL (ref 1.7–2.4)

## 2023-05-18 LAB — CBC
HCT: 42.1 % (ref 36.0–46.0)
Hemoglobin: 13.7 g/dL (ref 12.0–15.0)
MCH: 33.7 pg (ref 26.0–34.0)
MCHC: 32.5 g/dL (ref 30.0–36.0)
MCV: 103.7 fL — ABNORMAL HIGH (ref 80.0–100.0)
Platelets: 211 10*3/uL (ref 150–400)
RBC: 4.06 MIL/uL (ref 3.87–5.11)
RDW: 14.2 % (ref 11.5–15.5)
WBC: 10.3 10*3/uL (ref 4.0–10.5)
nRBC: 0 % (ref 0.0–0.2)

## 2023-05-18 LAB — PHOSPHORUS: Phosphorus: 3.8 mg/dL (ref 2.5–4.6)

## 2023-05-18 MED ORDER — NICOTINE 21 MG/24HR TD PT24
21.0000 mg | MEDICATED_PATCH | Freq: Every day | TRANSDERMAL | Status: DC
  Administered 2023-05-18 – 2023-05-22 (×2): 21 mg via TRANSDERMAL
  Filled 2023-05-18 (×6): qty 1

## 2023-05-18 MED ORDER — ACETAMINOPHEN 325 MG PO TABS
650.0000 mg | ORAL_TABLET | Freq: Four times a day (QID) | ORAL | Status: DC | PRN
  Administered 2023-05-18 – 2023-05-22 (×3): 650 mg via ORAL
  Filled 2023-05-18 (×3): qty 2

## 2023-05-18 MED ORDER — PROCHLORPERAZINE EDISYLATE 10 MG/2ML IJ SOLN
10.0000 mg | Freq: Four times a day (QID) | INTRAMUSCULAR | Status: DC | PRN
  Administered 2023-05-19: 10 mg via INTRAVENOUS
  Filled 2023-05-18: qty 2

## 2023-05-18 MED ORDER — TIZANIDINE HCL 4 MG PO TABS
4.0000 mg | ORAL_TABLET | Freq: Four times a day (QID) | ORAL | Status: DC | PRN
  Administered 2023-05-19: 4 mg via ORAL
  Filled 2023-05-18: qty 1

## 2023-05-18 MED ORDER — LACTATED RINGERS IV SOLN: INTRAVENOUS | Status: DC

## 2023-05-18 MED ORDER — LEVOTHYROXINE SODIUM 25 MCG PO TABS
137.0000 ug | ORAL_TABLET | Freq: Every day | ORAL | Status: DC
  Administered 2023-05-19 – 2023-05-23 (×3): 137 ug via ORAL
  Filled 2023-05-18 (×3): qty 1

## 2023-05-18 MED ORDER — HYDROMORPHONE HCL 1 MG/ML IJ SOLN
0.5000 mg | INTRAMUSCULAR | Status: DC | PRN
  Administered 2023-05-18 – 2023-05-19 (×10): 0.5 mg via INTRAVENOUS
  Filled 2023-05-18 (×11): qty 0.5

## 2023-05-18 MED ORDER — PANTOPRAZOLE SODIUM 40 MG IV SOLR
40.0000 mg | Freq: Two times a day (BID) | INTRAVENOUS | Status: DC
  Administered 2023-05-18 – 2023-05-23 (×10): 40 mg via INTRAVENOUS
  Filled 2023-05-18 (×10): qty 10

## 2023-05-18 MED ORDER — PROMETHAZINE HCL 25 MG/ML IJ SOLN
INTRAMUSCULAR | Status: AC
  Filled 2023-05-18: qty 1

## 2023-05-18 MED ORDER — ACETAMINOPHEN 650 MG RE SUPP: 650.0000 mg | Freq: Four times a day (QID) | RECTAL | Status: DC | PRN

## 2023-05-18 MED ORDER — PROCHLORPERAZINE EDISYLATE 10 MG/2ML IJ SOLN
10.0000 mg | Freq: Four times a day (QID) | INTRAMUSCULAR | Status: DC | PRN
  Administered 2023-05-18 (×2): 10 mg via INTRAVENOUS
  Filled 2023-05-18 (×2): qty 2

## 2023-05-18 MED ORDER — HYDROMORPHONE HCL 1 MG/ML IJ SOLN
1.0000 mg | Freq: Once | INTRAMUSCULAR | Status: AC
  Administered 2023-05-18: 1 mg via INTRAVENOUS
  Filled 2023-05-18: qty 1

## 2023-05-18 MED ORDER — SODIUM CHLORIDE 0.9 % IV BOLUS
1000.0000 mL | Freq: Once | INTRAVENOUS | Status: AC
  Administered 2023-05-18: 1000 mL via INTRAVENOUS

## 2023-05-18 MED ORDER — BUSPIRONE HCL 5 MG PO TABS
7.5000 mg | ORAL_TABLET | Freq: Two times a day (BID) | ORAL | Status: DC
  Administered 2023-05-18 – 2023-05-23 (×7): 7.5 mg via ORAL
  Filled 2023-05-18 (×8): qty 2

## 2023-05-18 MED ORDER — ALPRAZOLAM 0.5 MG PO TABS
0.5000 mg | ORAL_TABLET | Freq: Two times a day (BID) | ORAL | Status: DC
  Administered 2023-05-18 – 2023-05-20 (×6): 0.5 mg via ORAL
  Filled 2023-05-18 (×7): qty 1

## 2023-05-18 MED ORDER — MOMETASONE FURO-FORMOTEROL FUM 200-5 MCG/ACT IN AERO
2.0000 | INHALATION_SPRAY | Freq: Two times a day (BID) | RESPIRATORY_TRACT | Status: DC
  Administered 2023-05-18 – 2023-05-20 (×3): 2 via RESPIRATORY_TRACT
  Filled 2023-05-18 (×2): qty 8.8

## 2023-05-18 MED ORDER — FAMOTIDINE IN NACL 20-0.9 MG/50ML-% IV SOLN
20.0000 mg | Freq: Once | INTRAVENOUS | Status: AC
  Administered 2023-05-18: 20 mg via INTRAVENOUS
  Filled 2023-05-18: qty 50

## 2023-05-18 MED ORDER — SODIUM CHLORIDE 0.9 % IV SOLN
25.0000 mg | Freq: Once | INTRAVENOUS | Status: AC
  Administered 2023-05-18: 25 mg via INTRAVENOUS
  Filled 2023-05-18: qty 1

## 2023-05-18 MED ORDER — ONDANSETRON HCL 4 MG PO TABS: 4.0000 mg | ORAL_TABLET | Freq: Four times a day (QID) | ORAL | Status: DC | PRN

## 2023-05-18 MED ORDER — ONDANSETRON HCL 4 MG/2ML IJ SOLN
4.0000 mg | Freq: Four times a day (QID) | INTRAMUSCULAR | Status: DC | PRN
  Administered 2023-05-18 – 2023-05-20 (×9): 4 mg via INTRAVENOUS
  Filled 2023-05-18 (×9): qty 2

## 2023-05-18 NOTE — TOC CM/SW Note (Signed)
Transition of Care [facility]) - Inpatient Brief Assessment   Patient Details  Name: Melanie Shepard MRN: [id number] Date of Birth: 07/12/1966  Transition of Care [facility]) CM/SW Contact:    Villa Herb, LCSWA Phone Number: 05/18/2023, 10:25 AM   Clinical Narrative: Transition of Care Department [facility]) has reviewed patient and no TOC needs have been identified at this time. We will continue to monitor patient advancement through interdisciplinary progression rounds. If new patient transition needs arise, please place a TOC consult.  Transition of Care Asessment: Insurance and Status: Selfpay Patient has primary care physician: Yes Home environment has been reviewed: from home with spouse Prior level of function:: independent Prior/Current Home Services: No current home services Social Determinants of Health Reivew: SDOH reviewed no interventions necessary Readmission risk has been reviewed: Yes Transition of care needs: no transition of care needs at this time
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
## 2023-05-18 NOTE — Progress Notes (Signed)
ASSUMPTION OF CARE NOTE   05/18/2023 4:27 PM  Melanie Shepard was seen and examined.  The H&P by the admitting provider, orders, imaging was reviewed.  Please see new orders.  Will continue to follow.   Vitals:   05/18/23 0430 05/18/23 1446  BP: (!) 142/79 107/62  Pulse: 67 63  Resp: 18 18  Temp:  98.7 F (37.1 C)  SpO2: 93% 93%    Results for orders placed or performed during the hospital encounter of 05/17/23  Comprehensive metabolic panel  Result Value Ref Range   Sodium 140 135 - 145 mmol/L   Potassium 3.5 3.5 - 5.1 mmol/L   Chloride 101 98 - 111 mmol/L   CO2 27 22 - 32 mmol/L   Glucose, Bld 149 (H) 70 - 99 mg/dL   BUN 10 6 - 20 mg/dL   Creatinine, Ser 5.78 0.44 - 1.00 mg/dL   Calcium 9.6 8.9 - 46.9 mg/dL   Total Protein 8.3 (H) 6.5 - 8.1 g/dL   Albumin 4.3 3.5 - 5.0 g/dL   AST 28 15 - 41 U/L   ALT 17 0 - 44 U/L   Alkaline Phosphatase 127 (H) 38 - 126 U/L   Total Bilirubin 0.6 0.3 - 1.2 mg/dL   GFR, Estimated >62 >95 mL/min   Anion gap 12 5 - 15  Lipase, blood  Result Value Ref Range   Lipase 36 11 - 51 U/L  CBC with Differential  Result Value Ref Range   WBC 12.0 (H) 4.0 - 10.5 K/uL   RBC 4.35 3.87 - 5.11 MIL/uL   Hemoglobin 14.6 12.0 - 15.0 g/dL   HCT 28.4 13.2 - 44.0 %   MCV 99.8 80.0 - 100.0 fL   MCH 33.6 26.0 - 34.0 pg   MCHC 33.6 30.0 - 36.0 g/dL   RDW 10.2 72.5 - 36.6 %   Platelets 264 150 - 400 K/uL   nRBC 0.0 0.0 - 0.2 %   Neutrophils Relative % 66 %   Neutro Abs 8.1 (H) 1.7 - 7.7 K/uL   Lymphocytes Relative 27 %   Lymphs Abs 3.2 0.7 - 4.0 K/uL   Monocytes Relative 3 %   Monocytes Absolute 0.4 0.1 - 1.0 K/uL   Eosinophils Relative 2 %   Eosinophils Absolute 0.2 0.0 - 0.5 K/uL   Basophils Relative 1 %   Basophils Absolute 0.1 0.0 - 0.1 K/uL   Immature Granulocytes 1 %   Abs Immature Granulocytes 0.07 0.00 - 0.07 K/uL  Urinalysis, Routine w reflex microscopic -Urine, Clean Catch  Result Value Ref Range   Color, Urine YELLOW YELLOW   APPearance  CLEAR CLEAR   Specific Gravity, Urine 1.013 1.005 - 1.030   pH 6.0 5.0 - 8.0   Glucose, UA NEGATIVE NEGATIVE mg/dL   Hgb urine dipstick NEGATIVE NEGATIVE   Bilirubin Urine NEGATIVE NEGATIVE   Ketones, ur NEGATIVE NEGATIVE mg/dL   Protein, ur NEGATIVE NEGATIVE mg/dL   Nitrite NEGATIVE NEGATIVE   Leukocytes,Ua TRACE (A) NEGATIVE   RBC / HPF 0-5 0 - 5 RBC/hpf   WBC, UA 0-5 0 - 5 WBC/hpf   Bacteria, UA RARE (A) NONE SEEN   Squamous Epithelial / HPF 6-10 0 - 5 /HPF   Mucus PRESENT   Rapid urine drug screen (hospital performed)  Result Value Ref Range   Opiates POSITIVE (A) NONE DETECTED   Cocaine NONE DETECTED NONE DETECTED   Benzodiazepines POSITIVE (A) NONE DETECTED   Amphetamines NONE DETECTED NONE DETECTED  Tetrahydrocannabinol NONE DETECTED NONE DETECTED   Barbiturates NONE DETECTED NONE DETECTED  Comprehensive metabolic panel  Result Value Ref Range   Sodium 139 135 - 145 mmol/L   Potassium 3.9 3.5 - 5.1 mmol/L   Chloride 105 98 - 111 mmol/L   CO2 23 22 - 32 mmol/L   Glucose, Bld 136 (H) 70 - 99 mg/dL   BUN 10 6 - 20 mg/dL   Creatinine, Ser 1.61 0.44 - 1.00 mg/dL   Calcium 8.3 (L) 8.9 - 10.3 mg/dL   Total Protein 7.1 6.5 - 8.1 g/dL   Albumin 3.8 3.5 - 5.0 g/dL   AST 29 15 - 41 U/L   ALT 17 0 - 44 U/L   Alkaline Phosphatase 106 38 - 126 U/L   Total Bilirubin 0.6 0.3 - 1.2 mg/dL   GFR, Estimated >09 >60 mL/min   Anion gap 11 5 - 15  CBC  Result Value Ref Range   WBC 10.3 4.0 - 10.5 K/uL   RBC 4.06 3.87 - 5.11 MIL/uL   Hemoglobin 13.7 12.0 - 15.0 g/dL   HCT 45.4 09.8 - 11.9 %   MCV 103.7 (H) 80.0 - 100.0 fL   MCH 33.7 26.0 - 34.0 pg   MCHC 32.5 30.0 - 36.0 g/dL   RDW 14.7 82.9 - 56.2 %   Platelets 211 150 - 400 K/uL   nRBC 0.0 0.0 - 0.2 %  Magnesium  Result Value Ref Range   Magnesium 1.8 1.7 - 2.4 mg/dL  Phosphorus  Result Value Ref Range   Phosphorus 3.8 2.5 - 4.6 mg/dL  Hemoglobin Z3Y  Result Value Ref Range   Hgb A1c MFr Bld 6.6 (H) 4.8 - 5.6 %   Mean  Plasma Glucose 142.72 mg/dL    Maryln Manuel, MD [facility]   05/17/2023  7:39 PM How to contact the [facility] Attending or Consulting provider 7A - 7P or covering provider during after hours 7P -7A, for this patient?  Check the care team in [facility] and look for a) attending/consulting [facility] provider listed and b) the [facility] team listed Log into [email or web address] and use [facility] universal password to access. If you do not have the password, please contact the hospital operator. Locate the [facility] provider you are looking for under [facility] and page to a number that you can be directly reached. If you still have difficulty reaching the provider, please page the [facility] (Director on Call) for the Hospitalists listed on amion for assistance.

## 2023-05-18 NOTE — ED Notes (Signed)
Pt c/o nausea and pain. Provider made aware. See [facility]

## 2023-05-18 NOTE — ED Notes (Signed)
Hospitalist and lab at bedside

## 2023-05-18 NOTE — Progress Notes (Addendum)
Msg sent to attending pt is requesting that her promethazine (PHENERGAN) 25 mg in sodium chloride 0.9 % 50 mL IVPB that ws given as 1 time dose at 1420.. be scheduled instead of 1 time dose. It is on her home med list. No new orders at this time. Aida Raider, NP  added to chat
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
## 2023-05-19 ENCOUNTER — Other Ambulatory Visit: Payer: Self-pay

## 2023-05-19 DIAGNOSIS — G43809 Other migraine, not intractable, without status migrainosus: Secondary | ICD-10-CM

## 2023-05-19 DIAGNOSIS — E063 Autoimmune thyroiditis: Secondary | ICD-10-CM

## 2023-05-19 DIAGNOSIS — F411 Generalized anxiety disorder: Secondary | ICD-10-CM

## 2023-05-19 LAB — BASIC METABOLIC PANEL
Anion gap: 11 (ref 5–15)
BUN: 6 mg/dL (ref 6–20)
CO2: 26 mmol/L (ref 22–32)
Calcium: 8.8 mg/dL — ABNORMAL LOW (ref 8.9–10.3)
Chloride: 101 mmol/L (ref 98–111)
Creatinine, Ser: 0.68 mg/dL (ref 0.44–1.00)
GFR, Estimated: >60 mL/min (ref >60)
Glucose, Bld: 97 mg/dL (ref 70–99)
Potassium: 3 mmol/L — ABNORMAL LOW (ref 3.5–5.1)
Sodium: 138 mmol/L (ref 135–145)

## 2023-05-19 LAB — VITAMIN B12: Vitamin B-12: 365 pg/mL (ref 180–914)

## 2023-05-19 LAB — CBC
HCT: 38.8 % (ref 36.0–46.0)
Hemoglobin: 12.8 g/dL (ref 12.0–15.0)
MCH: 33.3 pg (ref 26.0–34.0)
MCHC: 33 g/dL (ref 30.0–36.0)
MCV: 101 fL — ABNORMAL HIGH (ref 80.0–100.0)
Platelets: 192 10*3/uL (ref 150–400)
RBC: 3.84 MIL/uL — ABNORMAL LOW (ref 3.87–5.11)
RDW: 14.1 % (ref 11.5–15.5)
WBC: 8.8 10*3/uL (ref 4.0–10.5)
nRBC: 0 % (ref 0.0–0.2)

## 2023-05-19 LAB — MAGNESIUM: Magnesium: 1.7 mg/dL (ref 1.7–2.4)

## 2023-05-19 LAB — FOLATE: Folate: 5.2 ng/mL — ABNORMAL LOW (ref >5.9)

## 2023-05-19 MED ORDER — ENOXAPARIN SODIUM 40 MG/0.4ML IJ SOSY
40.0000 mg | PREFILLED_SYRINGE | Freq: Every day | INTRAMUSCULAR | Status: DC
  Administered 2023-05-19 – 2023-05-23 (×5): 40 mg via SUBCUTANEOUS
  Filled 2023-05-19 (×5): qty 0.4

## 2023-05-19 MED ORDER — MAGNESIUM SULFATE 4 GM/100ML IV SOLN
4.0000 g | Freq: Once | INTRAVENOUS | Status: AC
  Administered 2023-05-19: 4 g via INTRAVENOUS
  Filled 2023-05-19: qty 100

## 2023-05-19 MED ORDER — CHLORHEXIDINE GLUCONATE CLOTH 2 % EX PADS
6.0000 | MEDICATED_PAD | Freq: Every day | CUTANEOUS | Status: DC
  Administered 2023-05-19 – 2023-05-23 (×5): 6 via TOPICAL

## 2023-05-19 MED ORDER — SODIUM CHLORIDE 0.9 % IV SOLN
25.0000 mg | Freq: Three times a day (TID) | INTRAVENOUS | Status: DC | PRN
  Administered 2023-05-19 – 2023-05-20 (×3): 25 mg via INTRAVENOUS
  Filled 2023-05-19 (×4): qty 1

## 2023-05-19 MED ORDER — POTASSIUM CHLORIDE CRYS ER 20 MEQ PO TBCR
40.0000 meq | EXTENDED_RELEASE_TABLET | Freq: Once | ORAL | Status: AC
  Administered 2023-05-19: 40 meq via ORAL
  Filled 2023-05-19: qty 2

## 2023-05-19 MED ORDER — HYDROMORPHONE HCL 1 MG/ML IJ SOLN
0.5000 mg | INTRAMUSCULAR | Status: DC | PRN
  Administered 2023-05-19 – 2023-05-20 (×7): 0.5 mg via INTRAVENOUS
  Filled 2023-05-19 (×6): qty 0.5

## 2023-05-19 MED ORDER — POTASSIUM CHLORIDE 10 MEQ/100ML IV SOLN
10.0000 meq | INTRAVENOUS | Status: AC
  Administered 2023-05-19 (×2): 10 meq via INTRAVENOUS
  Filled 2023-05-19 (×2): qty 100

## 2023-05-19 MED ORDER — SODIUM CHLORIDE 0.9% FLUSH
10.0000 mL | INTRAVENOUS | Status: DC | PRN
  Administered 2023-05-22: 10 mL

## 2023-05-19 MED ORDER — MELATONIN 3 MG PO TABS
6.0000 mg | ORAL_TABLET | Freq: Once | ORAL | Status: AC
  Administered 2023-05-19: 6 mg via ORAL
  Filled 2023-05-19: qty 2

## 2023-05-19 MED ORDER — KETOROLAC TROMETHAMINE 30 MG/ML IJ SOLN: 30.0000 mg | Freq: Once | INTRAMUSCULAR | Status: DC

## 2023-05-19 MED ORDER — SODIUM CHLORIDE 0.9% FLUSH
10.0000 mL | Freq: Two times a day (BID) | INTRAVENOUS | Status: DC
  Administered 2023-05-19 – 2023-05-23 (×6): 10 mL

## 2023-05-19 MED ORDER — KETOROLAC TROMETHAMINE 30 MG/ML IJ SOLN
30.0000 mg | Freq: Three times a day (TID) | INTRAMUSCULAR | Status: DC | PRN
  Administered 2023-05-19 – 2023-05-23 (×9): 30 mg via INTRAVENOUS
  Filled 2023-05-19 (×9): qty 1

## 2023-05-19 MED ORDER — ZOLPIDEM TARTRATE 5 MG PO TABS
5.0000 mg | ORAL_TABLET | Freq: Every evening | ORAL | Status: DC | PRN
  Administered 2023-05-19 – 2023-05-20 (×2): 5 mg via ORAL
  Filled 2023-05-19 (×2): qty 1

## 2023-05-19 NOTE — Progress Notes (Signed)
Patient major complaint is headache also continues to have associated nausea and vomiting.  Single dose of Phenergan yesterday helped.  Zofran not so much.  Patient takes Phenergan multiple times daily at home.  Intolerant to Reglan.  Vital signs in last 24 hours: Temp:  [98.1 F (36.7 C)-99.1 F (37.3 C)] 98.9 F (37.2 C) (06/08 0408) Pulse Rate:  [57-63] 60 (06/08 0408) Resp:  [18-20] 20 (06/08 0408) BP: (107-156)/(62-88) 156/88 (06/08 0408) SpO2:  [93 %-97 %] 93 % (06/08 0408) Last BM Date : 05/17/23 General:   Awake, alert.  Appears not feel well.   Abdomen: Nondistended.  Soft and nontender extremities:  Without clubbing or edema.    Intake/Output from previous day: 06/07 0701 - 06/08 0700 In: 2313.5 [P.O.:240; I.V.:2073.5] Out: -  Intake/Output this shift: No intake/output data recorded.  Lab Results: Recent Labs    05/17/23 2020 05/18/23 0503 05/19/23 0354  WBC 12.0* 10.3 8.8  HGB 14.6 13.7 12.8  HCT 43.4 42.1 38.8  PLT 264 211 192   BMET Recent Labs    05/17/23 2020 05/18/23 0503 05/19/23 0354  NA 140 139 138  K 3.5 3.9 3.0*  CL 101 105 101  CO2 27 23 26   GLUCOSE 149* 136* 97  BUN 10 10 6   CREATININE 0.82 0.80 0.68  CALCIUM 9.6 8.3* 8.8*   LFT Recent Labs    05/18/23 0503  PROT 7.1  ALBUMIN 3.8  AST 29  ALT 17  ALKPHOS 106  BILITOT 0.6   PT/INR No results for input(s): "LABPROT", "INR" in the last 72 hours. Hepatitis Panel No results for input(s): "HEPBSAG", "HCVAB", "HEPAIGM", "HEPBIGM" in the last 72 hours. C-Diff No results for input(s): "CDIFFTOX" in the last 72 hours.  Studies/Results: CT ABDOMEN PELVIS WO CONTRAST  Result Date: 05/18/2023 CLINICAL DATA:  Acute nonlocalized abdominal pain. Vomiting, headache, and chills starting 4 hours ago. EXAM: CT ABDOMEN AND PELVIS WITHOUT CONTRAST TECHNIQUE: Multidetector CT imaging of the abdomen and pelvis was performed following the standard protocol without IV contrast. RADIATION DOSE  REDUCTION: This exam was performed according to the departmental dose-optimization program which includes automated exposure control, adjustment of the mA and/or kV according to patient size and/or use of iterative reconstruction technique. COMPARISON:  10/12/2022 and 08/16/2022 FINDINGS: Lower chest: Linear fibrosis or atelectasis in the lung bases. Hepatobiliary: Prominent diffuse fatty infiltration of the liver. No focal lesions. Surgical absence of the gallbladder. No bile duct dilatation. Pancreas: Unremarkable. No pancreatic ductal dilatation or surrounding inflammatory changes. Spleen: Normal in size without focal abnormality. Adrenals/Urinary Tract: Adrenal glands are unremarkable. Kidneys are normal, without renal calculi, focal lesion, or hydronephrosis. Bladder is unremarkable. Stomach/Bowel: Stomach, small bowel, and colon are not abnormally distended. Scattered stool throughout the colon. No wall thickening or inflammatory changes. Appendix is not identified. Vascular/Lymphatic: Aortic atherosclerosis. No enlarged abdominal or pelvic lymph nodes. Reproductive: No pelvic mass. Other: No abdominal wall hernia or abnormality. No abdominopelvic ascites. Musculoskeletal: No acute or significant osseous findings. IMPRESSION: 1. No evidence of bowel obstruction or inflammation. 2. Diffuse fatty infiltration of the liver. 3. Aortic atherosclerosis. Electronically Signed   By: Burman Nieves M.D.   On: 05/18/2023 01:53   CT HEAD WO CONTRAST ( )  Result Date: 05/18/2023 CLINICAL DATA:  New onset headaches. Vomiting and chills starting 4 hours ago. EXAM: CT HEAD WITHOUT CONTRAST TECHNIQUE: Contiguous axial images were obtained from the base of the skull through the vertex without intravenous contrast. RADIATION DOSE REDUCTION: This exam was performed according  to the departmental dose-optimization program which includes automated exposure control, adjustment of the mA and/or kV according to patient size  and/or use of iterative reconstruction technique. COMPARISON:  CT head 09/11/2013.  MRI brain 07/28/2013 FINDINGS: Brain: No evidence of acute infarction, hemorrhage, hydrocephalus, extra-axial collection or mass lesion/mass effect. Vascular: No hyperdense vessel or unexpected calcification. Skull: Normal. Negative for fracture or focal lesion. Sinuses/Orbits: No acute finding. Other: None. IMPRESSION: No acute intracranial abnormalities. Electronically Signed   By: Burman Nieves M.D.   On: 05/18/2023 01:49    Impression: 57 year old lady with recurrent migraine headaches with associated recurrent nausea and vomiting.  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  Recommendations:  Continue symptomatic support  No more than clear liquids at this time  Phenergan 25 mg IV Q 8 hours x 6 doses scheduled.  Discussed with nursing staff  Discontinue Compazine  Continue PPI  Management of headache per attending

## 2023-05-19 NOTE — Progress Notes (Signed)
Peripherally Inserted Central Catheter Placement  The IV Nurse has discussed with the patient and/or persons authorized to consent for the patient, the purpose of this procedure and the potential benefits and risks involved with this procedure.  The benefits include less needle sticks, lab draws from the catheter, and the patient may be discharged home with the catheter. Risks include, but not limited to, infection, bleeding, blood clot (thrombus formation), and puncture of an artery; nerve damage and irregular heartbeat and possibility to perform a PICC exchange if needed/ordered by physician.  Alternatives to this procedure were also discussed.  Bard Power PICC patient education guide, fact sheet on infection prevention and patient information card has been provided to patient /or left at bedside.    PICC Placement Documentation  PICC Double Lumen 05/19/23 Right Brachial 37 cm 0 cm (Active)  Indication for Insertion or Continuance of Line Poor Vasculature-patient has had multiple peripheral attempts or PIVs lasting less than 24 hours 05/19/23 1614  Exposed Catheter (cm) 0 cm 05/19/23 1614  Site Assessment Clean, Dry, Intact 05/19/23 1614  Lumen #1 Status Flushed;Saline locked;Blood return noted 05/19/23 1614  Lumen #2 Status Flushed;Saline locked;Blood return noted 05/19/23 1614  Dressing Type Transparent;Securing device 05/19/23 1614  Dressing Status Antimicrobial disc in place;Clean, Dry, Intact 05/19/23 1614  Safety Lock Not Applicable 05/19/23 1614  Line Care Connections checked and tightened 05/19/23 1614  Line Adjustment (NICU/IV Team Only) No 05/19/23 1614  Dressing Intervention New dressing 05/19/23 1614  Dressing Change Due 05/26/23 05/19/23 1614       Elliot Dally 05/19/2023, 4:15 PM

## 2023-05-19 NOTE — Progress Notes (Signed)
PROGRESS NOTE   Melanie Shepard  ZOX:[id number] DOB: Jun 04, 1966 DOA: 05/17/2023 PCP: Romeo Rabon, MD   Chief Complaint  Patient presents with   Emesis    Chills and headache   Level of care: Med-Surg  Brief Admission History:  57 y.o. female with medical history significant of GERD, hypothyroidism, hyperlipidemia who presented to the emergency department accompanied by spouse due to nausea and vomiting associated with headache and chills which started earlier yesterday.  Abdominal pain in epigastric area and was sharp in nature. Vomitus was nonbloody and after having about 7-8 episodes, she decided to go to the ED for further evaluation and management.  Patient was admitted for similar presentation from 11/2 to 10/14/2022 during which EGD done showed dyskinesia of esophagus, unspecified gastritis, duodenitis without bleeding, unspecified dysphagia.   ED Course:  In the emergency department, BP was 144/82 and other vital signs were within normal range.  Workup in the ED showed normal CBC except for WBC of 12.0.  BMP was normal except for glucose of 149, ALP 127, lipase 36, urinalysis was normal.  CT abdomen pelvis without contrast showed no evidence of bowel obstruction or inflammation. CT head without contrast showed no acute intracranial abnormalities.  Patient was treated with Dilaudid, Zofran, Phenergan, Pepcid, morphine.  Droperidol was given and IV hydration was provided.  Hospitalist was asked to admit patient for further evaluation and management.  Assessment and Plan:  Intractable migraine headache -continues to have ongoing constant throbbing headache with photophobia -continue phenergan treatment, she cannot tolerate metoclopramide -added ketorolac IM x 1 dose, then prn IV doses  -continue IV fluid hydration -CT brain with no acute findings -if persists, will need to consult neurology on 6/10  Intractable nausea and vomiting -suspect secondary to complicated migraine -continue  treatments per GI with scheduled promethazine IV  -zofran not helping  Insomnia -zolpidem 5 mg PRN ordered -pt reports intolerance to trazodone  Hypokalemia -from repeated emesis (GI losses) -give IV magnesium 4 gm x 1 dose -IV and oral potassium replacement ordered -recheck K level in AM   GAD -resumed home alprazolam treatment   Hashimoto's thyroiditis  - resume levothyroxine  Tobacco -nicotine patch ordered and tobacco cessation counseling   DVT prophylaxis: SCDs, enoxaparin Code Status: Full  Family Communication:  Disposition: Status is: Inpatient    Consultants:  GI service  Procedures:   Antimicrobials:    Subjective: Pt complains of not sleeping, constant headache and photophobia, vomited several times overnight and not able to tolerate clears.   Objective: Vitals:   05/18/23 1702 05/18/23 1952 05/18/23 1955 05/19/23 0408  BP: 112/68 139/78  (!) 156/88  Pulse: (!) 57 63  60  Resp: 20 20  20   Temp: 98.1 F (36.7 C) 99.1 F (37.3 C)  98.9 F (37.2 C)  TempSrc: Oral     SpO2: 97% 95% 94% 93%  Weight:      Height:        Intake/Output Summary (Last 24 hours) at 05/19/2023 1035 Last data filed at 05/19/2023 0335 Gross per 24 hour  Intake 2313.54 ml  Output --  Net 2313.54 ml   Filed Weights   05/17/23 1937  Weight: 67.8 kg   Examination:  General exam: chronically ill appearing, Appears calm and comfortable  Respiratory system: Clear to auscultation. Respiratory effort normal. Cardiovascular system: normal S1 & S2 heard. No JVD, murmurs, rubs, gallops or clicks. No pedal edema. Gastrointestinal system: Abdomen is nondistended, soft and nontender. No organomegaly or masses felt. Normal  bowel sounds heard. Central nervous system: Alert and oriented. No focal neurological deficits. Extremities: Symmetric 5 x 5 power. Skin: No rashes, lesions or ulcers. Psychiatry: Judgement and insight appear normal. Mood & affect appropriate.   Data Reviewed: I  have personally reviewed following labs and imaging studies  CBC: Recent Labs  Lab 05/17/23 2020 05/18/23 0503 05/19/23 0354  WBC 12.0* 10.3 8.8  NEUTROABS 8.1*  --   --   HGB 14.6 13.7 12.8  HCT 43.4 42.1 38.8  MCV 99.8 103.7* 101.0*  PLT 264 211 192    Basic Metabolic Panel: Recent Labs  Lab 05/17/23 2020 05/18/23 0503 05/19/23 0354  NA 140 139 138  K 3.5 3.9 3.0*  CL 101 105 101  CO2 27 23 26   GLUCOSE 149* 136* 97  BUN 10 10 6   CREATININE 0.82 0.80 0.68  CALCIUM 9.6 8.3* 8.8*  MG  --  1.8 1.7  PHOS  --  3.8  --     CBG: No results for input(s): "GLUCAP" in the last 168 hours.  No results found for this or any previous visit (from the past 240 hour(s)).   Radiology Studies: CT ABDOMEN PELVIS WO CONTRAST  Result Date: 05/18/2023 CLINICAL DATA:  Acute nonlocalized abdominal pain. Vomiting, headache, and chills starting 4 hours ago. EXAM: CT ABDOMEN AND PELVIS WITHOUT CONTRAST TECHNIQUE: Multidetector CT imaging of the abdomen and pelvis was performed following the standard protocol without IV contrast. RADIATION DOSE REDUCTION: This exam was performed according to the departmental dose-optimization program which includes automated exposure control, adjustment of the mA and/or kV according to patient size and/or use of iterative reconstruction technique. COMPARISON:  10/12/2022 and 08/16/2022 FINDINGS: Lower chest: Linear fibrosis or atelectasis in the lung bases. Hepatobiliary: Prominent diffuse fatty infiltration of the liver. No focal lesions. Surgical absence of the gallbladder. No bile duct dilatation. Pancreas: Unremarkable. No pancreatic ductal dilatation or surrounding inflammatory changes. Spleen: Normal in size without focal abnormality. Adrenals/Urinary Tract: Adrenal glands are unremarkable. Kidneys are normal, without renal calculi, focal lesion, or hydronephrosis. Bladder is unremarkable. Stomach/Bowel: Stomach, small bowel, and colon are not abnormally  distended. Scattered stool throughout the colon. No wall thickening or inflammatory changes. Appendix is not identified. Vascular/Lymphatic: Aortic atherosclerosis. No enlarged abdominal or pelvic lymph nodes. Reproductive: No pelvic mass. Other: No abdominal wall hernia or abnormality. No abdominopelvic ascites. Musculoskeletal: No acute or significant osseous findings. IMPRESSION: 1. No evidence of bowel obstruction or inflammation. 2. Diffuse fatty infiltration of the liver. 3. Aortic atherosclerosis. Electronically Signed   By: Burman Nieves M.D.   On: 05/18/2023 01:53   CT HEAD WO CONTRAST ( )  Result Date: 05/18/2023 CLINICAL DATA:  New onset headaches. Vomiting and chills starting 4 hours ago. EXAM: CT HEAD WITHOUT CONTRAST TECHNIQUE: Contiguous axial images were obtained from the base of the skull through the vertex without intravenous contrast. RADIATION DOSE REDUCTION: This exam was performed according to the departmental dose-optimization program which includes automated exposure control, adjustment of the mA and/or kV according to patient size and/or use of iterative reconstruction technique. COMPARISON:  CT head 09/11/2013.  MRI brain 07/28/2013 FINDINGS: Brain: No evidence of acute infarction, hemorrhage, hydrocephalus, extra-axial collection or mass lesion/mass effect. Vascular: No hyperdense vessel or unexpected calcification. Skull: Normal. Negative for fracture or focal lesion. Sinuses/Orbits: No acute finding. Other: None. IMPRESSION: No acute intracranial abnormalities. Electronically Signed   By: Burman Nieves M.D.   On: 05/18/2023 01:49    Scheduled Meds:  ALPRAZolam  0.5 mg  Oral BID   busPIRone  7.5 mg Oral BID   ketorolac  30 mg Intramuscular Once   levothyroxine  137 mcg Oral Q0600   mometasone-formoterol  2 puff Inhalation BID   nicotine  21 mg Transdermal Daily   pantoprazole (PROTONIX) IV  40 mg Intravenous Q12H   potassium chloride  40 mEq Oral Once   Continuous  Infusions:  lactated ringers 100 mL/hr at 05/18/23 1020   magnesium sulfate bolus IVPB     potassium chloride     promethazine (PHENERGAN) injection (IM or IVPB)       LOS: 1 day   Time spent: 38 mins  Melanie Durney Laural Benes, MD How to contact the [facility] Attending or Consulting provider 7A - 7P or covering provider during after hours 7P -7A, for this patient?  Check the care team in [facility] and look for a) attending/consulting TRH provider listed and b) the [facility] team listed Log into [email or web address] and use [place]'s universal password to access. If you do not have the password, please contact the hospital operator. Locate the [facility] provider you are looking for under [facility] and page to a number that you can be directly reached. If you still have difficulty reaching the provider, please page the [facility] (Director on Call) for the Hospitalists listed on amion for assistance.  05/19/2023, 10:35 AM

## 2023-05-20 DIAGNOSIS — E538 Deficiency of other specified B group vitamins: Secondary | ICD-10-CM

## 2023-05-20 LAB — BASIC METABOLIC PANEL
Anion gap: 9 (ref 5–15)
BUN: 6 mg/dL (ref 6–20)
CO2: 28 mmol/L (ref 22–32)
Calcium: 8.4 mg/dL — ABNORMAL LOW (ref 8.9–10.3)
Chloride: 101 mmol/L (ref 98–111)
Creatinine, Ser: 0.7 mg/dL (ref 0.44–1.00)
GFR, Estimated: >60 mL/min (ref >60)
Glucose, Bld: 91 mg/dL (ref 70–99)
Potassium: 3.3 mmol/L — ABNORMAL LOW (ref 3.5–5.1)
Sodium: 138 mmol/L (ref 135–145)

## 2023-05-20 LAB — MAGNESIUM: Magnesium: 2.2 mg/dL (ref 1.7–2.4)

## 2023-05-20 MED ORDER — SODIUM CHLORIDE 0.9 % IV SOLN
25.0000 mg | Freq: Three times a day (TID) | INTRAVENOUS | Status: AC
  Administered 2023-05-20 – 2023-05-21 (×3): 25 mg via INTRAVENOUS
  Filled 2023-05-20 (×3): qty 1

## 2023-05-20 MED ORDER — POTASSIUM CHLORIDE 10 MEQ/100ML IV SOLN
10.0000 meq | INTRAVENOUS | Status: AC
  Administered 2023-05-20 (×3): 10 meq via INTRAVENOUS
  Filled 2023-05-20 (×3): qty 100

## 2023-05-20 MED ORDER — SODIUM CHLORIDE 0.9 % IV SOLN: 25.0000 mg | Freq: Three times a day (TID) | INTRAVENOUS | Status: DC | PRN

## 2023-05-20 MED ORDER — HYDROMORPHONE HCL 1 MG/ML IJ SOLN: 0.5000 mg | INTRAMUSCULAR | Status: DC | PRN

## 2023-05-20 MED ORDER — HYDROMORPHONE HCL 1 MG/ML IJ SOLN
0.2500 mg | INTRAMUSCULAR | Status: DC | PRN
  Administered 2023-05-20 (×5): 0.25 mg via INTRAVENOUS
  Filled 2023-05-20 (×6): qty 0.5

## 2023-05-20 MED ORDER — FOLIC ACID 1 MG PO TABS
1.0000 mg | ORAL_TABLET | Freq: Every day | ORAL | Status: DC
  Administered 2023-05-20 – 2023-05-23 (×2): 1 mg via ORAL
  Filled 2023-05-20 (×3): qty 1

## 2023-05-20 NOTE — Plan of Care (Signed)
Patient received multiple doses of antiemetics and pain medication throughout the night with some relief. Medication effective for HA and abdominal pain. Mentions that dilaudid is the only medication that works. Was given PRN sleeping aid but patient did not sleep any during the night.     Problem: Education: Goal: Knowledge of General Education information will improve Description: Including pain rating scale, medication(s)/side effects and non-pharmacologic comfort measures Outcome: Progressing   Problem: Health Behavior/Discharge Planning: Goal: Ability to manage health-related needs will improve Outcome: Progressing   Problem: Clinical Measurements: Goal: Ability to maintain clinical measurements within normal limits will improve Outcome: Progressing Goal: Will remain free from infection Outcome: Progressing Goal: Diagnostic test results will improve Outcome: Progressing Goal: Respiratory complications will improve Outcome: Progressing Goal: Cardiovascular complication will be avoided Outcome: Progressing   Problem: Activity: Goal: Risk for activity intolerance will decrease Outcome: Progressing   Problem: Nutrition: Goal: Adequate nutrition will be maintained Outcome: Progressing   Problem: Coping: Goal: Level of anxiety will decrease Outcome: Progressing   Problem: Elimination: Goal: Will not experience complications related to bowel motility Outcome: Progressing Goal: Will not experience complications related to urinary retention Outcome: Progressing   Problem: Pain Managment: Goal: General experience of comfort will improve Outcome: Progressing   Problem: Safety: Goal: Ability to remain free from injury will improve Outcome: Progressing   Problem: Skin Integrity: Goal: Risk for impaired skin integrity will decrease Outcome: Progressing

## 2023-05-20 NOTE — Progress Notes (Signed)
PROGRESS NOTE   Melanie Shepard  ZHY:[id number] DOB: [birth year] DOA: 05/17/2023 PCP: Romeo Rabon, MD   Chief Complaint  Patient presents with   Emesis    Chills and headache   Level of care: Med-Surg  Brief Admission History:  57 y.o. female with medical history significant of GERD, hypothyroidism, hyperlipidemia who presented to the emergency department accompanied by spouse due to nausea and vomiting associated with headache and chills which started earlier yesterday.  Abdominal pain in epigastric area and was sharp in nature. Vomitus was nonbloody and after having about 7-8 episodes, she decided to go to the ED for further evaluation and management.  Patient was admitted for similar presentation from 11/2 to 10/14/2022 during which EGD done showed dyskinesia of esophagus, unspecified gastritis, duodenitis without bleeding, unspecified dysphagia.   ED Course:  In the emergency department, BP was 144/82 and other vital signs were within normal range.  Workup in the ED showed normal CBC except for WBC of 12.0.  BMP was normal except for glucose of 149, ALP 127, lipase 36, urinalysis was normal.  CT abdomen pelvis without contrast showed no evidence of bowel obstruction or inflammation. CT head without contrast showed no acute intracranial abnormalities.  Patient was treated with Dilaudid, Zofran, Phenergan, Pepcid, morphine.  Droperidol was given and IV hydration was provided.  Hospitalist was asked to admit patient for further evaluation and management.  Assessment and Plan:  Intractable migraine headache -continues to have ongoing constant throbbing headache with photophobia -continue phenergan treatment, she cannot tolerate metoclopramide -added ketorolac IM x 1 dose, then prn IV doses  -continue IV fluid hydration -CT brain with no acute findings -if persists, will need to consult neurology on 6/10 or arrange outpatient referral to neurology  Intractable nausea and vomiting - resolving   -suspect secondary to complicated migraine -continue treatments per GI with scheduled promethazine IV  -zofran not helping -advance diet to soft foods today  Folic Acid Deficiency -added folic acid 1 mg daily   Insomnia -zolpidem 5 mg PRN ordered -pt reports intolerance to trazodone  Hypokalemia -from repeated emesis (GI losses) -give IV magnesium 4 gm x 1 dose -IV and oral potassium replacement ordered -recheck K level in AM -give additional K today    GAD -resumed home alprazolam treatment   Hashimoto's thyroiditis  - resume levothyroxine  Tobacco -nicotine patch ordered and tobacco cessation counseling   DVT prophylaxis: SCDs, enoxaparin Code Status: Full  Family Communication:  Disposition: Status is: Inpatient    Consultants:  GI service  Procedures:   Antimicrobials:    Subjective: Pt reports that nausea is much improved and wanting to advance diet, headache persists with photophobia.   Objective: Vitals:   05/19/23 1955 05/19/23 2058 05/20/23 0459 05/20/23 0927  BP:  130/74 123/75   Pulse:  61 (!) 50   Resp:  16 18   Temp:  98.2 F (36.8 C) 97.9 F (36.6 C)   TempSrc:      SpO2: 97% 95% 96% 95%  Weight:      Height:        Intake/Output Summary (Last 24 hours) at 05/20/2023 1300 Last data filed at 05/20/2023 0900 Gross per 24 hour  Intake 2112.66 ml  Output --  Net 2112.66 ml   Filed Weights   05/17/23 1937  Weight: 67.8 kg   Examination:  General exam: chronically ill appearing, Appears calm and comfortable  Respiratory system: Clear to auscultation. Respiratory effort normal. Cardiovascular system: normal S1 & S2 heard.  No JVD, murmurs, rubs, gallops or clicks. No pedal edema. Gastrointestinal system: Abdomen is nondistended, soft and nontender. No organomegaly or masses felt. Normal bowel sounds heard. Central nervous system: Alert and oriented. No focal neurological deficits. Extremities: Symmetric 5 x 5 power. Skin: No rashes,  lesions or ulcers. Psychiatry: Judgement and insight appear normal. Mood & affect appropriate.   Data Reviewed: I have personally reviewed following labs and imaging studies  CBC: Recent Labs  Lab 05/17/23 2020 05/18/23 0503 05/19/23 0354  WBC 12.0* 10.3 8.8  NEUTROABS 8.1*  --   --   HGB 14.6 13.7 12.8  HCT 43.4 42.1 38.8  MCV 99.8 103.7* 101.0*  PLT 264 211 192    Basic Metabolic Panel: Recent Labs  Lab 05/17/23 2020 05/18/23 0503 05/19/23 0354 05/20/23 0339  NA 140 139 138 138  K 3.5 3.9 3.0* 3.3*  CL 101 105 101 101  CO2 27 23 26 28   GLUCOSE 149* 136* 97 91  BUN 10 10 6 6   CREATININE 0.82 0.80 0.68 0.70  CALCIUM 9.6 8.3* 8.8* 8.4*  MG  --  1.8 1.7 2.2  PHOS  --  3.8  --   --     CBG: No results for input(s): "GLUCAP" in the last 168 hours.  No results found for this or any previous visit (from the past 240 hour(s)).   Radiology Studies: [place] EKG SITE RITE  Result Date: 05/19/2023 If Site Rite image not attached, placement could not be confirmed due to current cardiac rhythm.   Scheduled Meds:  ALPRAZolam  0.5 mg Oral BID   busPIRone  7.5 mg Oral BID   Chlorhexidine Gluconate Cloth  6 each Topical Daily   enoxaparin (LOVENOX) injection  40 mg Subcutaneous Daily   folic acid  1 mg Oral Daily   ketorolac  30 mg Intramuscular Once   levothyroxine  137 mcg Oral Q0600   mometasone-formoterol  2 puff Inhalation BID   nicotine  21 mg Transdermal Daily   pantoprazole (PROTONIX) IV  40 mg Intravenous Q12H   sodium chloride flush  10-40 mL Intracatheter Q12H   Continuous Infusions:  lactated ringers 100 mL/hr at 05/19/23 1745   promethazine (PHENERGAN) injection (IM or IVPB) 25 mg (05/20/23 1233)   [START ON 05/21/2023] promethazine (PHENERGAN) injection (IM or IVPB)      LOS: 2 days   Time spent: 35 mins  Kerry Odonohue Laural Benes, MD How to contact the [facility] Attending or Consulting provider 7A - 7P or covering provider during after hours 7P -7A, for this patient?   Check the care team in [facility] and look for a) attending/consulting TRH provider listed and b) the [facility] team listed Log into [email or web address] and use [place]'s universal password to access. If you do not have the password, please contact the hospital operator. Locate the [facility] provider you are looking for under [facility] and page to a number that you can be directly reached. If you still have difficulty reaching the provider, please page the [facility] (Director on Call) for the Hospitalists listed on amion for assistance.  05/20/2023, 1:00 PM

## 2023-05-20 NOTE — Progress Notes (Signed)
Patient states nausea "left her" last night.  Tolerating clear liquids.  Diet being advanced for lunch.  No abdominal pain.  Still has a significant headache.   Vital signs in last 24 hours: Temp:  [97.9 F (36.6 C)-98.2 F (36.8 C)] 97.9 F (36.6 C) (06/09 0459) Pulse Rate:  [47-61] 50 (06/09 0459) Resp:  [16-18] 18 (06/09 0459) BP: (108-130)/(65-75) 123/75 (06/09 0459) SpO2:  [93 %-97 %] 95 % (06/09 0927) Last BM Date : 05/17/23 General:   Alert,  Well-developed, well-nourished, pleasant and cooperative in NAD Abdomen: Nondistended.  Soft and nontender  Intake/Output from previous day: 06/08 0701 - 06/09 0700 In: 1872.7 [P.O.:960; I.V.:694; IV Piggyback:218.6] Out: -  Intake/Output this shift: No intake/output data recorded.  Lab Results: Recent Labs    05/17/23 2020 05/18/23 0503 05/19/23 0354  WBC 12.0* 10.3 8.8  HGB 14.6 13.7 12.8  HCT 43.4 42.1 38.8  PLT 264 211 192   BMET Recent Labs    05/18/23 0503 05/19/23 0354 05/20/23 0339  NA 139 138 138  K 3.9 3.0* 3.3*  CL 105 101 101  CO2 23 26 28   GLUCOSE 136* 97 91  BUN 10 6 6   CREATININE 0.80 0.68 0.70  CALCIUM 8.3* 8.8* 8.4*   LFT Recent Labs    05/18/23 0503  PROT 7.1  ALBUMIN 3.8  AST 29  ALT 17  ALKPHOS 106  BILITOT 0.6   PT/INR No results for input(s): "LABPROT", "INR" in the last 72 hours.  Impression: 57 year old lady with recurrent headaches-likely cyclical vomiting syndrome associated with poorly controlled headache.  History of traumatic brain injury.  Nausea improved with scheduled Phenergan.  Recommendations:  -Agree with advancing diet -Phenergan 25 mg orally 3 times daily as needed as an outpatient. -Limit ibuprofen use -Limit caffeine intake -Continue Protonix 40 mg twice daily as an outpatient -Electrolyte management per attending -If she continues to progress, she could be potentially discharged in the next 24 hours from a GI standpoint  -It would be very helpful if  patient had a thorough evaluation by headache expert regarding recurrent episodes and history of traumatic brain injury. -I will arrange a hospital follow-up with me in 4 to 6 weeks.

## 2023-05-21 ENCOUNTER — Inpatient Hospital Stay (HOSPITAL_COMMUNITY): Payer: Medicare Other

## 2023-05-21 DIAGNOSIS — G929 Unspecified toxic encephalopathy: Secondary | ICD-10-CM

## 2023-05-21 DIAGNOSIS — R410 Disorientation, unspecified: Secondary | ICD-10-CM

## 2023-05-21 LAB — HEPATIC FUNCTION PANEL
ALT: 19 U/L (ref 0–44)
AST: 27 U/L (ref 15–41)
Albumin: 3.7 g/dL (ref 3.5–5.0)
Alkaline Phosphatase: 91 U/L (ref 38–126)
Bilirubin, Direct: 0.3 mg/dL — ABNORMAL HIGH (ref 0.0–0.2)
Indirect Bilirubin: 1.1 mg/dL — ABNORMAL HIGH (ref 0.3–0.9)
Total Bilirubin: 1.4 mg/dL — ABNORMAL HIGH (ref 0.3–1.2)
Total Protein: 6.8 g/dL (ref 6.5–8.1)

## 2023-05-21 LAB — BASIC METABOLIC PANEL
Anion gap: 9 (ref 5–15)
BUN: 6 mg/dL (ref 6–20)
CO2: 26 mmol/L (ref 22–32)
Calcium: 8.7 mg/dL — ABNORMAL LOW (ref 8.9–10.3)
Chloride: 96 mmol/L — ABNORMAL LOW (ref 98–111)
Creatinine, Ser: 0.85 mg/dL (ref 0.44–1.00)
GFR, Estimated: >60 mL/min (ref >60)
Glucose, Bld: 115 mg/dL — ABNORMAL HIGH (ref 70–99)
Potassium: 3.6 mmol/L (ref 3.5–5.1)
Sodium: 131 mmol/L — ABNORMAL LOW (ref 135–145)

## 2023-05-21 LAB — RAPID URINE DRUG SCREEN, HOSP PERFORMED
Amphetamines: NOT DETECTED
Barbiturates: NOT DETECTED
Benzodiazepines: POSITIVE — AB
Cocaine: NOT DETECTED
Opiates: NOT DETECTED
Tetrahydrocannabinol: NOT DETECTED

## 2023-05-21 LAB — PROTIME-INR
INR: 1.1 (ref 0.8–1.2)
Prothrombin Time: 14.1 seconds (ref 11.4–15.2)

## 2023-05-21 LAB — GLUCOSE, CAPILLARY: Glucose-Capillary: 99 mg/dL (ref 70–99)

## 2023-05-21 LAB — AMMONIA: Ammonia: 15 umol/L (ref 9–35)

## 2023-05-21 LAB — MAGNESIUM: Magnesium: 1.8 mg/dL (ref 1.7–2.4)

## 2023-05-21 LAB — ACETAMINOPHEN LEVEL: Acetaminophen (Tylenol), Serum: <10 ug/mL — ABNORMAL LOW (ref 10–30)

## 2023-05-21 MED ORDER — HALOPERIDOL LACTATE 5 MG/ML IJ SOLN
2.0000 mg | Freq: Four times a day (QID) | INTRAMUSCULAR | Status: DC | PRN
  Administered 2023-05-21 – 2023-05-22 (×3): 2 mg via INTRAVENOUS
  Filled 2023-05-21 (×4): qty 1

## 2023-05-21 MED ORDER — LORAZEPAM 2 MG/ML IJ SOLN
1.0000 mg | Freq: Once | INTRAMUSCULAR | Status: AC
  Administered 2023-05-21: 1 mg via INTRAVENOUS
  Filled 2023-05-21: qty 1

## 2023-05-21 MED ORDER — LORAZEPAM 2 MG/ML IJ SOLN
0.5000 mg | INTRAMUSCULAR | Status: DC | PRN
  Administered 2023-05-21 – 2023-05-23 (×7): 0.5 mg via INTRAVENOUS
  Filled 2023-05-21 (×8): qty 1
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
## 2023-05-21 NOTE — Progress Notes (Addendum)
Heard noises and mumbling down hallway.  Went into patient room, found patient sitting on top of room trashcan.  Patient was urinating on top of trash can.  Patient was awake but was in a daze and would not answer questions appropriately nor follow instructions.  Patient was assisted back in to bed. Fall precautions initiated.  Vitals signs were stable, but patient would not answer questions nor look staff in eye.  But patient was continuously pulling at blankets to place back over herself. Bottle of Tylenol PM found at patient bedside.  Primary nurse notified of patient behavior.

## 2023-05-21 NOTE — Progress Notes (Signed)
Pt has been sleeping most of the day, now awake, restless, pulling gown and bed linens off. Does not follow commands, does not make eye contact, does not talk. Pupils 5, reactive to light, equal and round. Pt will not take anything by mouth, clenches teeth together and/or chews on straw or spoon.  Pulls away from staff when we attempt to put gown back on her or straighten her bed linens. Grip is strong, moving all four extremities without difficulty. MD Laural Benes updated on current condition.

## 2023-05-21 NOTE — Progress Notes (Signed)
Pt transferred to [facility] via stretcher by [facility]. Husband aware and agreeable to to transfer. Pt remains restless and agitated, still does not follow commands, eyes open now but stares off into space like she's following something in the air with her eyes. Skin warm and dry. Haldol 2mg  IV given prior to transfer.

## 2023-05-21 NOTE — Progress Notes (Signed)
Pt continues with intermittent restlessness, still does not make eye contact or acknowledge anyone in room. Does not follow commands, still no po intake. Pupils remain 4 - 5, round, equal reaction to light.  Pt's husband and another family member are here, husband is concerned that pt is no better than when he left this am. He states that he would like for pt to be transferred to [facility] for neurology eval since we do not have one on site here. MD Laural Benes updated on pt's current status and husband's request.

## 2023-05-21 NOTE — Progress Notes (Addendum)
PROGRESS NOTE   Melanie Shepard  WGN:[id number] DOB: [birth year] DOA: 05/17/2023 PCP: Romeo Rabon, MD   Chief Complaint  Patient presents with   Emesis    Chills and headache   Level of care: Telemetry Medical  Brief Admission History:  57 y.o. female with medical history significant of GERD, hypothyroidism, hyperlipidemia who presented to the emergency department accompanied by spouse due to nausea and vomiting associated with headache and chills which started earlier yesterday.  Abdominal pain in epigastric area and was sharp in nature. Vomitus was nonbloody and after having about 7-8 episodes, she decided to go to the ED for further evaluation and management.  Patient was admitted for similar presentation from 11/2 to 10/14/2022 during which EGD done showed dyskinesia of esophagus, unspecified gastritis, duodenitis without bleeding, unspecified dysphagia.   ED Course:  In the emergency department, BP was 144/82 and other vital signs were within normal range.  Workup in the ED showed normal CBC except for WBC of 12.0.  BMP was normal except for glucose of 149, ALP 127, lipase 36, urinalysis was normal.  CT abdomen pelvis without contrast showed no evidence of bowel obstruction or inflammation. CT head without contrast showed no acute intracranial abnormalities.  Patient was treated with Dilaudid, Zofran, Phenergan, Pepcid, morphine.  Droperidol was given and IV hydration was provided.  Hospitalist was asked to admit patient for further evaluation and management.  Assessment and Plan:  Intractable migraine headache -continues to have ongoing constant throbbing headache with photophobia -continue phenergan treatment, she cannot tolerate metoclopramide -added ketorolac IM x 1 dose, then prn IV doses  -continue IV fluid hydration -CT brain with no acute findings  Intractable nausea and vomiting - resolving  -suspect secondary to complicated migraine -continue treatments per GI with scheduled  promethazine IV  -zofran not helping -advanced diet to soft foods but started vomiting again  Folic Acid Deficiency -added folic acid 1 mg daily   Insomnia -zolpidem 5 mg PRN ordered -pt reports intolerance to trazodone -nursing reporting pt took unknown amount of tylenol PM from purse last night  Acute delirium  Acute toxic encephalopathy -pt took unknown number of tylenol PM tablets last  night from purse without making anyone aware -also received phenergan IV and zolpidem -she has been acutely delirious with intermittent agitation  -sitter at bedside -husband demanding that pt transfer to [facility] for in person neurology consultation -will place transfer order to [facility] for neurology consultation -continue IV fluids  -repeating CT head without contrast -will speak with the neurologist on call at [facility] regarding transfer  Hypokalemia -from repeated emesis (GI losses) -give IV magnesium 4 gm x 1 dose -IV and oral potassium replacement ordered -recheck K level in AM -give additional K today    GAD -resumed home alprazolam treatment   Hashimoto's thyroiditis  - resume levothyroxine  Tobacco -nicotine patch ordered and tobacco cessation counseling  DVT prophylaxis: SCDs, enoxaparin Code Status: Full  Family Communication: husband demands transfer to [facility] tonight for neurology service Disposition: Status is: Inpatient    Consultants:  GI service  Transfer to [facility] for neurology service  Procedures:   Antimicrobials:    Subjective: Pt has been confused and agitated since last night  Objective: Vitals:   05/20/23 1330 05/20/23 1942 05/21/23 0312 05/21/23 0720  BP: 133/80 (!) 169/85 130/61 125/74  Pulse: 63 68 68 77  Resp: 18  18 18   Temp: 97.8 F (36.6 C) 99 F (37.2 C) 97.7 F (36.5 C)   TempSrc: Oral  Oral Axillary   SpO2: 99% 96% 94% 96%  Weight:      Height:        Intake/Output Summary (Last 24 hours) at 05/21/2023 1619 Last data filed at 05/21/2023 1300 Gross  per 24 hour  Intake 100 ml  Output --  Net 100 ml   Filed Weights   05/17/23 1937  Weight: 67.8 kg   Examination:  General exam: confused and agitated, delirious;   Respiratory system: Clear to auscultation. Respiratory effort normal. Cardiovascular system: normal S1 & S2 heard. No JVD, murmurs, rubs, gallops or clicks. No pedal edema. Gastrointestinal system: Abdomen is nondistended, soft and nontender. No organomegaly or masses felt. Normal bowel sounds heard. Central nervous system: confused and intermittently agitated. No focal neurological deficits. Extremities: Symmetric 5 x 5 power. Skin: No rashes, lesions or ulcers. Psychiatry: Judgement and insight poor. Mood & affect unable to determine.   Data Reviewed: I have personally reviewed following labs and imaging studies  CBC: Recent Labs  Lab 05/17/23 2020 05/18/23 0503 05/19/23 0354  WBC 12.0* 10.3 8.8  NEUTROABS 8.1*  --   --   HGB 14.6 13.7 12.8  HCT 43.4 42.1 38.8  MCV 99.8 103.7* 101.0*  PLT 264 211 192    Basic Metabolic Panel: Recent Labs  Lab 05/17/23 2020 05/18/23 0503 05/19/23 0354 05/20/23 0339 05/21/23 0800  NA 140 139 138 138 131*  K 3.5 3.9 3.0* 3.3* 3.6  CL 101 105 101 101 96*  CO2 27 23 26 28 26   GLUCOSE 149* 136* 97 91 115*  BUN 10 10 6 6 6   CREATININE 0.82 0.80 0.68 0.70 0.85  CALCIUM 9.6 8.3* 8.8* 8.4* 8.7*  MG  --  1.8 1.7 2.2 1.8  PHOS  --  3.8  --   --   --     CBG: No results for input(s): "GLUCAP" in the last 168 hours.  No results found for this or any previous visit (from the past 240 hour(s)).   Radiology Studies: No results found.  Scheduled Meds:  ALPRAZolam  0.5 mg Oral BID   busPIRone  7.5 mg Oral BID   Chlorhexidine Gluconate Cloth  6 each Topical Daily   enoxaparin (LOVENOX) injection  40 mg Subcutaneous Daily   folic acid  1 mg Oral Daily   levothyroxine  137 mcg Oral Q0600   mometasone-formoterol  2 puff Inhalation BID   nicotine  21 mg Transdermal Daily    pantoprazole (PROTONIX) IV  40 mg Intravenous Q12H   sodium chloride flush  10-40 mL Intracatheter Q12H   Continuous Infusions:  lactated ringers 100 mL/hr at 05/21/23 1404    LOS: 3 days   Time spent: 45 mins  Mizael Sagar Laural Benes, MD How to contact the [facility] Attending or Consulting provider 7A - 7P or covering provider during after hours 7P -7A, for this patient?  Check the care team in [facility] and look for a) attending/consulting TRH provider listed and b) the [facility] team listed Log into [email or web address] and use [place]'s universal password to access. If you do not have the password, please contact the hospital operator. Locate the [facility] provider you are looking for under [facility] and page to a number that you can be directly reached. If you still have difficulty reaching the provider, please page the [facility] (Director on Call) for the Hospitalists listed on amion for assistance.  05/21/2023, 4:19 PM

## 2023-05-21 NOTE — Progress Notes (Signed)
Awaiting transport to CT, Ativan 1 mg IV given. Per mother's request, pt's 2 necklaces and 3 bracelets removed from pt and given to mother Jerolyn Center).

## 2023-05-21 NOTE — Progress Notes (Signed)
Pt arrived from [facility] about 2000.   Pt resting comfortably with some restlessness.  Pt LR started but pt refuses to allow tele to be placed on her.  Pt hasn't spoken since arriving.

## 2023-05-21 NOTE — Progress Notes (Signed)
Pt has had intermittent periods of agitation and climbing OOB this am, only responds to physical redirection. MD Laural Benes was notified of events of the night and finding bottle of Tylenol PM at bedside. Pt's husband and staff member at bedside. Pt continues to require 1:1 sitter. Pt's husband asked to leave, states, "I don't think I'm any help." Advised pt husband that staff will be with pt till she is awake and appropriate in behavior and it is not necessary for him to stay. Nursing Supervisor updated on current situation with sitter.

## 2023-05-21 NOTE — Progress Notes (Signed)
Order received for patient to receive a sitter for safety. Patient continues to attempt to crawl out of bed, nonverbal but does not appear to be in distress. Vitals WNL. Sitter came to bedside at 0420. Husband was called and asked to come to hospital to be a familiar face and help reorient this patient at 0425 by Clinical research associate. Husband verbalized understanding and is in route from [place]. IV fluids disconnected due to patient attempting to pull out PICC line. Patient is no longer pulling at PICC. Patient currently in hospital gown and will be frequently monitored.

## 2023-05-22 ENCOUNTER — Other Ambulatory Visit (HOSPITAL_COMMUNITY): Payer: Self-pay

## 2023-05-22 ENCOUNTER — Inpatient Hospital Stay (HOSPITAL_COMMUNITY): Payer: Medicare Other

## 2023-05-22 DIAGNOSIS — G43109 Migraine with aura, not intractable, without status migrainosus: Secondary | ICD-10-CM

## 2023-05-22 DIAGNOSIS — G9340 Encephalopathy, unspecified: Secondary | ICD-10-CM

## 2023-05-22 DIAGNOSIS — R410 Disorientation, unspecified: Secondary | ICD-10-CM

## 2023-05-22 DIAGNOSIS — E538 Deficiency of other specified B group vitamins: Secondary | ICD-10-CM

## 2023-05-22 DIAGNOSIS — F411 Generalized anxiety disorder: Secondary | ICD-10-CM

## 2023-05-22 DIAGNOSIS — R519 Headache, unspecified: Secondary | ICD-10-CM

## 2023-05-22 DIAGNOSIS — G929 Unspecified toxic encephalopathy: Secondary | ICD-10-CM

## 2023-05-22 LAB — CBC WITH DIFFERENTIAL/PLATELET
Abs Immature Granulocytes: 0.08 10*3/uL — ABNORMAL HIGH (ref 0.00–0.07)
Basophils Absolute: 0.1 10*3/uL (ref 0.0–0.1)
Basophils Relative: 0 %
Eosinophils Absolute: 0.1 10*3/uL (ref 0.0–0.5)
Eosinophils Relative: 1 %
HCT: 36.5 % (ref 36.0–46.0)
Hemoglobin: 12.3 g/dL (ref 12.0–15.0)
Immature Granulocytes: 1 %
Lymphocytes Relative: 32 %
Lymphs Abs: 3.9 10*3/uL (ref 0.7–4.0)
MCH: 32.5 pg (ref 26.0–34.0)
MCHC: 33.7 g/dL (ref 30.0–36.0)
MCV: 96.3 fL (ref 80.0–100.0)
Monocytes Absolute: 0.9 10*3/uL (ref 0.1–1.0)
Monocytes Relative: 7 %
Neutro Abs: 7.1 10*3/uL (ref 1.7–7.7)
Neutrophils Relative %: 59 %
Platelets: 260 10*3/uL (ref 150–400)
RBC: 3.79 MIL/uL — ABNORMAL LOW (ref 3.87–5.11)
RDW: 13.7 % (ref 11.5–15.5)
WBC: 12.1 10*3/uL — ABNORMAL HIGH (ref 4.0–10.5)
nRBC: 0 % (ref 0.0–0.2)

## 2023-05-22 LAB — COMPREHENSIVE METABOLIC PANEL
ALT: 18 U/L (ref 0–44)
AST: 24 U/L (ref 15–41)
Albumin: 3.5 g/dL (ref 3.5–5.0)
Alkaline Phosphatase: 78 U/L (ref 38–126)
Anion gap: 13 (ref 5–15)
BUN: 6 mg/dL (ref 6–20)
CO2: 24 mmol/L (ref 22–32)
Calcium: 8.7 mg/dL — ABNORMAL LOW (ref 8.9–10.3)
Chloride: 99 mmol/L (ref 98–111)
Creatinine, Ser: 0.91 mg/dL (ref 0.44–1.00)
GFR, Estimated: >60 mL/min (ref >60)
Glucose, Bld: 95 mg/dL (ref 70–99)
Potassium: 3.1 mmol/L — ABNORMAL LOW (ref 3.5–5.1)
Sodium: 136 mmol/L (ref 135–145)
Total Bilirubin: 1.6 mg/dL — ABNORMAL HIGH (ref 0.3–1.2)
Total Protein: 6.4 g/dL — ABNORMAL LOW (ref 6.5–8.1)

## 2023-05-22 LAB — PHOSPHORUS: Phosphorus: 3.8 mg/dL (ref 2.5–4.6)

## 2023-05-22 LAB — MAGNESIUM: Magnesium: 1.5 mg/dL — ABNORMAL LOW (ref 1.7–2.4)

## 2023-05-22 MED ORDER — POTASSIUM CHLORIDE CRYS ER 20 MEQ PO TBCR: 40.0000 meq | EXTENDED_RELEASE_TABLET | ORAL | Status: DC

## 2023-05-22 MED ORDER — MAGNESIUM SULFATE 50 % IJ SOLN
6.0000 g | Freq: Once | INTRAVENOUS | Status: AC
  Administered 2023-05-22: 6 g via INTRAVENOUS
  Filled 2023-05-22: qty 10

## 2023-05-22 MED ORDER — PROCHLORPERAZINE EDISYLATE 10 MG/2ML IJ SOLN
5.0000 mg | Freq: Once | INTRAMUSCULAR | Status: AC
  Administered 2023-05-22: 5 mg via INTRAVENOUS
  Filled 2023-05-22: qty 2

## 2023-05-22 MED ORDER — POTASSIUM CHLORIDE 10 MEQ/100ML IV SOLN
10.0000 meq | INTRAVENOUS | Status: AC
  Administered 2023-05-22 (×5): 10 meq via INTRAVENOUS
  Filled 2023-05-22 (×5): qty 100

## 2023-05-22 MED ORDER — PROCHLORPERAZINE EDISYLATE 10 MG/2ML IJ SOLN
10.0000 mg | Freq: Once | INTRAMUSCULAR | Status: AC
  Administered 2023-05-22: 10 mg via INTRAVENOUS
  Filled 2023-05-22: qty 2

## 2023-05-22 MED ORDER — EVOLOCUMAB 140 MG/ML ~~LOC~~ SOAJ
140.0000 mg | SUBCUTANEOUS | Status: DC
  Administered 2023-05-22: 140 mg via SUBCUTANEOUS

## 2023-05-22 NOTE — Progress Notes (Signed)
EEG complete - results pending 

## 2023-05-22 NOTE — Progress Notes (Addendum)
PROGRESS NOTE    Melanie Shepard  WUJ:[id number] DOB: 02/17/1966 DOA: 05/17/2023 PCP: Romeo Rabon, MD    Chief Complaint  Patient presents with   Emesis    Chills and headache    Brief Narrative:  57 y.o. female with medical history significant of GERD, hypothyroidism, hyperlipidemia who presented to the emergency department accompanied by spouse due to nausea and vomiting associated with headache and chills which started earlier 1 day prior to admission.  Abdominal pain in epigastric area and was sharp in nature. Vomitus was nonbloody and after having about 7-8 episodes, she decided to go to the ED for further evaluation and management.  Patient was admitted for similar presentation from 11/2 to 10/14/2022 during which EGD done showed dyskinesia of esophagus, unspecified gastritis, duodenitis without bleeding, unspecified dysphagia.   ED Course:  In the emergency department, BP was 144/82 and other vital signs were within normal range.  Workup in the ED showed normal CBC except for WBC of 12.0.  BMP was normal except for glucose of 149, ALP 127, lipase 36, urinalysis was normal.  CT abdomen pelvis without contrast showed no evidence of bowel obstruction or inflammation. CT head without contrast showed no acute intracranial abnormalities.  Patient was treated with Dilaudid, Zofran, Phenergan, Pepcid, morphine.  Droperidol was given and IV hydration was provided.  Hospitalist was asked to admit patient for further evaluation and management.  Patient was admitted at [facility], initially for intractable migraine headaches, IV fluids and concern for complicated migraine. Patient noted to have taken an unknown amount of Tylenol PM the evening of 05/20/2023 in addition to Ambien and noted to have developed an acute delirium/confusion/acute toxic encephalopathy. -Patient has been noted to have demanded transfer to [facility] for further evaluation by neurology. -Patient transferred to [facility], repeat head CT was done concerning for occipital areas of hypodensity consistent with either posterior circulation infarcts or PRES. patient admitted for further workup including MRI head.  Neurology following.     Assessment & Plan:   Principal Problem:   Intractable nausea and vomiting Active Problems:   Anxiety disorder   Type 2 diabetes mellitus without complication (HCC)   Tobacco abuse   Hashimoto's thyroiditis   Leukocytosis   Headache   Folate deficiency   Acute delirium   Toxic encephalopathy   Complicated migraine  #1 acute metabolic encephalopathy -Questionable etiology. -Patient noted to have initially presented with intractable nausea vomiting abdominal pain with concerns for complicated migraine headache at that time which had progressed to encephalopathy. -Initial head CT obtained on admission with no acute abnormalities. -Repeat head CT done concerning for PRES versus occipital infarcts. -Patient noted not to be severely hypertensive with no history of hypertension. -Patient seen and: Notation by neurology who recommended MRI and EEG. -MRI brain ordered however MRI limited as patient was altered and could not cooperate with exam resulting in an abbreviated exam which showed no restricted diffusion to suggest acute or subacute infarct. -EEG pending. -Will likely need a repeat MRI. -Neurology following and appreciate input and recommendations.  2.  Intractable nausea/vomiting/headache/complicated migraine -Likely a complicated migraine headache. -Patient initially given some IV promethazine with some improvement as Zofran noted not to be helping. -Patient this morning with complaints of nausea and significant headache concern for recurrent migraine headache. -Will give a dose of IV Compazine x 1, followed by Toradol 30 mg IV x 1. -Neurology following.  3.  Folic acid deficiency -Continue folic acid daily.  4.  Insomnia -Ambien as needed.  5.   Hypokalemia/hypomagnesemia -Likely secondary to GI losses. -Potassium at 3.1, magnesium at 1.5. -Magnesium sulfate 6 g IV x 1. -KCl 10 mEq IV every hour x 5 runs  6.  Generalized anxiety disorder -Continue home regimen BuSpar, Xanax.  7.  History of Hashimoto's thyroiditis -Synthroid.  8.  Tobacco abuse -Tobacco cessation. -Nicotine patch.  9.  Hyperlipidemia -Continue home regimen Repatha.  10.  GERD -PPI.     DVT prophylaxis: Lovenox Code Status: Full Family Communication: Updated mother at bedside. Disposition: TBD  Status is: Inpatient Remains inpatient appropriate because: Severity of illness   Consultants:  Neurology: Dr. Amada Jupiter 05/22/2023  Procedures:  CT head 05/18/2023, 05/21/2023 CT abdomen and pelvis 05/18/2023 MRI brain 05/22/2023   Antimicrobials:  Anti-infectives (From admission, onward)    None         Subjective: Patient looking uncomfortable in bed, complaining of severe headache and nausea.  In mittens.  Resting in bed.  Sitter at bedside.  Mother at bedside.  Objective: Vitals:   05/21/23 0312 05/21/23 0720 05/21/23 1800 05/21/23 2106  BP: 130/61 125/74 116/80 110/64  Pulse: 68 77 80 75  Resp: 18 18 20 16   Temp: 97.7 F (36.5 C)   99.3 F (37.4 C)  TempSrc: Axillary     SpO2: 94% 96% 97% 96%  Weight:      Height:        Intake/Output Summary (Last 24 hours) at 05/22/2023 1740 Last data filed at 05/22/2023 1207 Gross per 24 hour  Intake 10 ml  Output --  Net 10 ml   Filed Weights   05/17/23 1937  Weight: 67.8 kg    Examination:  General exam: Appears uncomfortable.  Mittens on.   Respiratory system: Clear to auscultation.  No wheezes, no crackles, no rhonchi.  Fair air movement.  Speaking in full sentences.  Respiratory effort normal. Cardiovascular system: S1 & S2 heard, RRR. No JVD, murmurs, rubs, gallops or clicks. No pedal edema. Gastrointestinal system: Abdomen is nondistended, soft and nontender. No organomegaly  or masses felt. Normal bowel sounds heard. Central nervous system: Alert and uncomfortable.  Moving extremities spontaneously.  Not following all commands appropriately to perform a full neurological exam.  No focal neurological deficits. Extremities: Symmetric 5 x 5 power. Skin: No rashes, lesions or ulcers Psychiatry: Judgement and insight appear poor to fair. Mood & affect unable to assess.     Data Reviewed: I have personally reviewed following labs and imaging studies  CBC: Recent Labs  Lab 05/17/23 2020 05/18/23 0503 05/19/23 0354 05/22/23 0900  WBC 12.0* 10.3 8.8 12.1*  NEUTROABS 8.1*  --   --  7.1  HGB 14.6 13.7 12.8 12.3  HCT 43.4 42.1 38.8 36.5  MCV 99.8 103.7* 101.0* 96.3  PLT 264 211 192 260    Basic Metabolic Panel: Recent Labs  Lab 05/18/23 0503 05/19/23 0354 05/20/23 0339 05/21/23 0800 05/22/23 0900  NA 139 138 138 131* 136  K 3.9 3.0* 3.3* 3.6 3.1*  CL 105 101 101 96* 99  CO2 23 26 28 26 24   GLUCOSE 136* 97 91 115* 95  BUN 10 6 6 6 6   CREATININE 0.80 0.68 0.70 0.85 0.91  CALCIUM 8.3* 8.8* 8.4* 8.7* 8.7*  MG 1.8 1.7 2.2 1.8 1.5*  PHOS 3.8  --   --   --  3.8    GFR: Estimated Creatinine Clearance: 67.1 mL/min (by C-G formula based on SCr of 0.91 mg/dL).  Liver Function  Tests: Recent Labs  Lab 05/17/23 2020 05/18/23 0503 05/21/23 1053 05/22/23 0900  AST 28 29 27 24   ALT 17 17 19 18   ALKPHOS 127* 106 91 78  BILITOT 0.6 0.6 1.4* 1.6*  PROT 8.3* 7.1 6.8 6.4*  ALBUMIN 4.3 3.8 3.7 3.5    CBG: Recent Labs  Lab 05/21/23 1752  GLUCAP 99     No results found for this or any previous visit (from the past 240 hour(s)).       Radiology Studies: MR BRAIN WO CONTRAST  Result Date: 05/22/2023 CLINICAL DATA:  Altered mental status EXAM: MRI HEAD WITHOUT CONTRAST TECHNIQUE: Multiplanar, multiecho pulse sequences of the brain and surrounding structures were obtained without intravenous contrast. COMPARISON:  07/28/2013 FINDINGS: Evaluation is  limited as the patient is altered and could not cooperate with the exam. Despite medication, the exam was terminated after obtaining the axial and coronal diffusion-weighted images and the axial T2 FLAIR, all of which are motion limited. Within these limitations, there is no restricted diffusion to suggest acute or subacute infarct. Motion artifact limits the utility of the FLAIR, however there does not appear to be any non artifactual FLAIR abnormality. IMPRESSION: Evaluation is limited as the patient is altered and could not cooperate with the exam, resulting in an abbreviated exam within these limitations, there is no restricted diffusion to suggest acute or subacute infarct. Electronically Signed   By: Wiliam Ke M.D.   On: 05/22/2023 11:13   CT HEAD WO CONTRAST ( )  Result Date: 05/21/2023 CLINICAL DATA:  Initial evaluation for mental status change, unknown cause. EXAM: CT HEAD WITHOUT CONTRAST TECHNIQUE: Contiguous axial images were obtained from the base of the skull through the vertex without intravenous contrast. RADIATION DOSE REDUCTION: This exam was performed according to the departmental dose-optimization program which includes automated exposure control, adjustment of the mA and/or kV according to patient size and/or use of iterative reconstruction technique. COMPARISON:  Prior CT from 05/18/2023. FINDINGS: Brain: Examination somewhat technically limited by positioning and motion. There is question of patchy hypodensity involving the cortical to subcortical aspects of the bilateral parieto-occipital regions (series 2, images 22, 20, 14), raising the possibility for PRES. Possible faint associated petechial blood products at the level of the subcortical right parietal lobe (series 5, image 31). No significant mass effect or midline shift. No other acute intracranial hemorrhage or large vessel territory infarct. No other mass lesion or midline shift. No hydrocephalus or extra-axial fluid  collection. Probable underlying mild chronic microvascular ischemic disease noted. Vascular: No abnormal hyperdense vessel. Scattered vascular calcifications noted within the carotid siphons. Skull: Scalp soft tissues and calvarium demonstrate no acute finding. Sinuses/Orbits: Globes and orbital soft tissues within normal limits. Paranasal sinuses are clear. No mastoid effusion. Other: None. IMPRESSION: 1. Mildly motion degraded exam. 2. Question patchy hypodensity involving the cortical to subcortical aspects of the bilateral parieto-occipital regions, raising the possibility for PRES. Possible faint associated petechial blood products at the level of the subcortical right parietal lobe. No significant mass effect or midline shift. Further evaluation with dedicated MRI could be performed for further evaluation as warranted. 3. No other acute intracranial abnormality. Electronically Signed   By: Rise Mu M.D.   On: 05/21/2023 18:37        Scheduled Meds:  busPIRone  7.5 mg Oral BID   Chlorhexidine Gluconate Cloth  6 each Topical Daily   enoxaparin (LOVENOX) injection  40 mg Subcutaneous Daily   Evolocumab  140 mg Subcutaneous Q14 Days  folic acid  1 mg Oral Daily   levothyroxine  137 mcg Oral Q0600   mometasone-formoterol  2 puff Inhalation BID   nicotine  21 mg Transdermal Daily   pantoprazole (PROTONIX) IV  40 mg Intravenous Q12H   sodium chloride flush  10-40 mL Intracatheter Q12H   Continuous Infusions:  lactated ringers 100 mL/hr at 05/22/23 1610   magnesium sulfate bolus IVPB     potassium chloride       LOS: 4 days    Time spent: 35 minutes    Ramiro Harvest, MD [facility]   To contact the attending provider between 7A-7P or the covering provider during after hours 7P-7A, please log into the web site [email or web address] and access using universal [place] password for that web site. If you do not have the password, please call the hospital  operator.  05/22/2023, 5:40 PM

## 2023-05-22 NOTE — Care Management Note (Signed)
Upon arriving to patient room to perform routine EEG, patient was flailing around.  Sitter stated that she has been like that all morning.

## 2023-05-22 NOTE — Consult Note (Addendum)
Neurology Consultation Reason for Consult: Altered mental status Referring Physician: Laural Benes, C  CC: Altered mental status  History is obtained from: Chart review  HPI: Melanie Shepard is a 57 y.o. female with a history of a single previous seizure who presents with altered mental status.  She presented on 6/7 with nausea, vomiting and abdominal pain.  She also did apparently complain of a headache at that time as well.  GI was consulted and suspected cyclical vomiting syndrome in the setting of migraine.  Since admission, there has been some fluctuation of her blood pressures from 100 systolic to 160s.   On the evening of 6/9, patient apparently took some Tylenol PM, and is unclear how many she took.  But on the morning of 6/10, she was much more confused.  CT was performed which demonstrates occipital areas of hypodensity consistent with either posterior circulation infarcts or PRES.   Past Medical History:  Diagnosis Date   Anemia    Anxiety    Arthritis    right hip   Cervical pain (neck)    chronic   Failed conscious sedation during procedure    during colonoscopy/EGD   GERD (gastroesophageal reflux disease)    Hyperlipidemia    Hypothyroidism    Hashimoto's   Mild cognitive impairment 05/17/2023   Repeated concussion of brain 08/1999   Seizures (HCC)    had 1 seizure 2 years ago and dut to lack of sleep; this precipitated seizures. No meds and no seizures since.     Family History  Problem Relation Age of Onset   Coronary artery disease Mother 81       MI   Colon cancer Mother 9       living     Social History:  reports that she has been smoking cigarettes. She has a 30.00 pack-year smoking history. She has never used smokeless tobacco. She reports that she does not drink alcohol and does not use drugs.   Exam: Current vital signs: BP 110/64 (BP Location: Right Wrist)   Pulse 75   Temp 99.3 F (37.4 C)   Resp 16   Ht 5\' 7"  (1.702 m)   Wt 67.8 kg   SpO2  96%   BMI 23.41 kg/m  Vital signs in last 24 hours: Temp:  [97.7 F (36.5 C)-99.3 F (37.4 C)] 99.3 F (37.4 C) (06/10 2106) Pulse Rate:  [68-80] 75 (06/10 2106) Resp:  [16-20] 16 (06/10 2106) BP: (110-130)/(61-80) 110/64 (06/10 2106) SpO2:  [94 %-97 %] 96 % (06/10 2106)   Physical Exam  Appears well-developed and well-nourished.   Neuro: Mental Status: Severely encephalopathic, she actively resists any manipulation of any extremity.  She does not follow commands or speak. Cranial Nerves: II: She keeps her eyes tightly shut, and brushes my hands away anytime I try and open her eyes to assess visual fields. Pupils are equal, round, and reactive to light.   III,IV, VI: Anytime I am able to get an eye opening, her eyes are midline, but I cannot assess doll's eye or EOM due to her actively fighting me V,VII: Blinks to eyelid stimulation bilaterally Motor: She moves all extremities briskly Sensory: She response to noxious stimulation in all four extremities DTR: No clonus Cerebellar: Does not perform   I have reviewed labs in epic and the results pertinent to this consultation are: WBC 8.8 B12 365 Ammonia 15 Sodium 131 Calcium 8.7 Creatinine 0.85  I have reviewed the images obtained: CT head-occipital hypodensities  Impression: 57 year old  female who presented with nausea/vomiting/headaches progressed to encephalopathy with findings on CT suggestive of PRES versus occipital strokes.  Though she has not been severely hypertensive, with no history of hypertension BPs fluctuating up to the 160s may be enough to account for it.  She will need MRI and I will also order an EEG.  Another possibility would be reversible cerebral vasoconstriction syndrome RCVS, which could have accounted for presenting symptoms and is highly associated with PRES.  With no fever, and only a transient white count, low suspicion for infectious etiology.  Recommendations: 1) MRI brain with and without  contrast 2) EEG 3) further recommendations following above studies   Ritta Slot, MD [facility] [phone]  If 7pm- 7am, please page neurology on call as listed in AMION.

## 2023-05-22 NOTE — Progress Notes (Signed)
Around 1400, pt was awake with husband and mom at bedside. Pt was able to speak clearly and was A&Ox4. This was a significant change from her earlier assessment. She stated she did not remember anything except from when she was at [facility] She stated she vaguely remembered talking to the doctor this AM. Ophelia Charter was at bedside with her and is currently helping her get cleaned up and ready to eat dinner. MD made aware. Pt is now able to have EEG done and MRI will be done as well.

## 2023-05-22 NOTE — Progress Notes (Signed)
Neurology Progress Note  Brief HPI: Melanie Shepard is a 57 y.o. female with a history of a single previous seizure who presents with altered mental status.  She presented on 6/7 with nausea, vomiting and abdominal pain.  She also did apparently complain of a headache at that time as well.  GI was consulted and suspected cyclical vomiting syndrome in the setting of migraine. Since admission, there has been some fluctuation of her blood pressures from 100 systolic to 160s.  On the evening of 6/9, patient apparently took some Tylenol PM, and is unclear how many she took.  But on the morning of 6/10, she was much more confused.  CT was performed which demonstrates occipital areas of hypodensity consistent with either posterior circulation infarcts or PRES.  Subjective: Sedated yesterday. But back to baseline today. No complaints. Wants to go home. Her husband and mom are in the room.  Exam: Vitals:   05/21/23 1800 05/21/23 2106  BP: 116/80 110/64  Pulse: 80 75  Resp: 20 16  Temp:  99.3 F (37.4 C)  SpO2: 97% 96%   Gen: NAD. Resp: non-labored breathing, no acute distress  Neuro: Mental Status: Awake. Alert oriented to person, place, year.   Cranial Nerves: EOMI. PERRLA, face symmetric.  Motor: Normal strength. Sensory: Intact to LT in all four extremities  Cerebellar: No ataxia.    Pertinent Labs: WBC 8.8 B12 365 Ammonia 15 Sodium 131 Calcium 8.7 Creatinine 0.85  Imaging Reviewed: CT Head- Question patchy hypodensity involving the cortical to subcortical aspects of the bilateral parieto-occipital regions, raising the possibility for PRES. Possible faint associated petechial blood products at the level of the subcortical right parietal lobe. No significant mass effect or midline shift. F  Assessment: 57 year old female who presented with nausea/vomiting/headaches progressed to encephalopathy with findings on CT suggestive of PRES versus occipital strokes.    MRI neg for  stroke. EEG: shows encephalopathy.  Impression: AMS resolved.  Recommendations: D/c home.  F/u with outpt neurologist.

## 2023-05-22 NOTE — Progress Notes (Signed)
Getting agitated and try to get out of bed.  Not able to get EEG due to this.  Received Compazine which helped with sedation.  MRI with significant motion artifact.  No stroke noted.  Will attempt a repeat MRI and get EEG while she is sedated

## 2023-05-22 NOTE — Progress Notes (Signed)
Nurse state that patient has not gone to MRI yet.  She will call once patient comes back, at which time the EEG will be attempted.

## 2023-05-22 NOTE — Progress Notes (Signed)
Advised to hold off on EEG for now per Dr. Viviann Spare.

## 2023-05-23 ENCOUNTER — Inpatient Hospital Stay (HOSPITAL_COMMUNITY): Payer: Medicare Other

## 2023-05-23 DIAGNOSIS — R569 Unspecified convulsions: Secondary | ICD-10-CM

## 2023-05-23 LAB — COMPREHENSIVE METABOLIC PANEL
ALT: 18 U/L (ref 0–44)
AST: 32 U/L (ref 15–41)
Albumin: 3.3 g/dL — ABNORMAL LOW (ref 3.5–5.0)
Alkaline Phosphatase: 78 U/L (ref 38–126)
Anion gap: 11 (ref 5–15)
BUN: 7 mg/dL (ref 6–20)
CO2: 24 mmol/L (ref 22–32)
Calcium: 8.2 mg/dL — ABNORMAL LOW (ref 8.9–10.3)
Chloride: 99 mmol/L (ref 98–111)
Creatinine, Ser: 0.84 mg/dL (ref 0.44–1.00)
GFR, Estimated: >60 mL/min (ref >60)
Glucose, Bld: 113 mg/dL — ABNORMAL HIGH (ref 70–99)
Potassium: 3.4 mmol/L — ABNORMAL LOW (ref 3.5–5.1)
Sodium: 134 mmol/L — ABNORMAL LOW (ref 135–145)
Total Bilirubin: 1 mg/dL (ref 0.3–1.2)
Total Protein: 6.4 g/dL — ABNORMAL LOW (ref 6.5–8.1)

## 2023-05-23 LAB — CBC
HCT: 36.4 % (ref 36.0–46.0)
Hemoglobin: 12.3 g/dL (ref 12.0–15.0)
MCH: 32.7 pg (ref 26.0–34.0)
MCHC: 33.8 g/dL (ref 30.0–36.0)
MCV: 96.8 fL (ref 80.0–100.0)
Platelets: 237 10*3/uL (ref 150–400)
RBC: 3.76 MIL/uL — ABNORMAL LOW (ref 3.87–5.11)
RDW: 13.8 % (ref 11.5–15.5)
WBC: 12.2 10*3/uL — ABNORMAL HIGH (ref 4.0–10.5)
nRBC: 0 % (ref 0.0–0.2)

## 2023-05-23 LAB — MAGNESIUM: Magnesium: 2.8 mg/dL — ABNORMAL HIGH (ref 1.7–2.4)

## 2023-05-23 MED ORDER — VITAMIN D (ERGOCALCIFEROL) 1.25 MG (50000 UNIT) PO CAPS: 50000.0000 [IU] | ORAL_CAPSULE | ORAL | 5 refills | Status: DC

## 2023-05-23 NOTE — Procedures (Signed)
Patient Name: Melanie Shepard  MRN: [id number]  Epilepsy Attending: Charlsie Quest  Referring Physician/Provider: Rejeana Brock, MD  Date: 05/22/2023 Duration: 22.54 mins  Patient history: 57 year old female who presented with nausea/vomiting/headaches progressed to encephalopathy with findings on CT suggestive of PRES versus occipital strokes. EEG to evaluate for seizure.  Level of alertness: Awake  AEDs during EEG study: Ativan  Technical aspects: This EEG study was done with scalp electrodes positioned according to the 10-20 International system of electrode placement. Electrical activity was reviewed with band pass filter of 1-70Hz , sensitivity of 7 uV/mm, display speed of 5mm/sec with a 60Hz  notched filter applied as appropriate. EEG data were recorded continuously and digitally stored.  Video monitoring was available and reviewed as appropriate.  Description: The posterior dominant rhythm consists of 8-9Hz  activity of moderate voltage (25-35 uV) seen predominantly in posterior head regions, symmetric and reactive to eye opening and eye closing. EEG showed intermittent generalized polymorphic 3 to 6 Hz theta-delta slowing. EEG also showed rhythmic 3-6hz  theta-delta slowing admixed with sharp transients in left occipital region although difficult to interpret due to significant movement and electrode artifact. Hyperventilation and photic stimulation were not performed.     EEG was technically difficult due to significant movement and electrode artifact  ABNORMALITY - Intermittent slow, generalized  IMPRESSION: This technically difficult study is suggestive of mild diffuse encephalopathy, nonspecific etiology. No definite seizures were seen throughout the recording.  Additionally EEG showed rhythmic 3-6hz  theta-delta slowing admixed with sharp transients in left occipital region although difficult to interpret due to significant movement and electrode artifact. If suspicion for  interictal activity remains a concern, a prolonged study can should be considered.   Dr. Viviann Spare was notified.  Jago Carton Annabelle Harman

## 2023-05-23 NOTE — Discharge Summary (Addendum)
Physician Discharge Summary   Patient: Melanie Shepard MRN: [id number] DOB: [birth year]  Admit date:     05/17/2023  Discharge date: 05/23/23  Discharge Physician: Lurene Shadow   PCP: Romeo Rabon, MD   Recommendations at discharge:    Follow up with PCP in 1 week  Discharge Diagnoses: Principal Problem:   Intractable nausea and vomiting Active Problems:   Anxiety disorder   Type 2 diabetes mellitus without complication (HCC)   Tobacco abuse   Hashimoto's thyroiditis   Leukocytosis   Headache   Folate deficiency   Acute delirium   Toxic encephalopathy   Complicated migraine  Resolved Problems:   * No resolved hospital problems. [facility] Course:   Melanie Shepard is a 57 y.o. female with medical history significant of GERD, hypothyroidism, hyperlipidemia who presented to the emergency department accompanied by spouse due to nausea and vomiting associated with headache and chills which started earlier 1 day prior to admission.  Abdominal pain in epigastric area and was sharp in nature. Vomitus was nonbloody and after having about 7-8 episodes, she decided to go to the ED for further evaluation and management.  Patient was admitted for similar presentation from 11/2 to 10/14/2022 during which EGD done showed dyskinesia of esophagus, unspecified gastritis, duodenitis without bleeding, unspecified dysphagia.   ED Course:  In the emergency department, BP was 144/82 and other vital signs were within normal range.  Workup in the ED showed normal CBC except for WBC of 12.0.  BMP was normal except for glucose of 149, ALP 127, lipase 36, urinalysis was normal.  CT abdomen pelvis without contrast showed no evidence of bowel obstruction or inflammation. CT head without contrast showed no acute intracranial abnormalities.  Patient was treated with Dilaudid, Zofran, Phenergan, Pepcid, morphine.  Droperidol was given and IV hydration was provided.  Hospitalist was asked to admit patient for  further evaluation and management.   Patient was admitted at [facility], initially for intractable migraine headaches, IV fluids and concern for complicated migraine. Patient noted to have taken an unknown amount of Tylenol PM the evening of 05/20/2023 in addition to Ambien and noted to have developed an acute delirium/confusion/acute toxic encephalopathy.  Family requested transfer to [facility] for further management. Repeat CT head was concerning for posterior circulation infarct or PRES.      Assessment and Plan:   Acute toxic metabolic encephalopathy -Questionable etiology. -Patient noted to have initially presented with intractable nausea vomiting abdominal pain with concerns for complicated migraine headache at that time which had progressed to encephalopathy. -Initial head CT obtained on admission with no acute abnormalities. -Repeat head CT done concerning for PRES versus occipital infarcts. -Patient noted not to be severely hypertensive with no history of hypertension. -Patient seen by neurology who recommended MRI and EEG. -MRI brain ordered however MRI limited as patient was altered and could not cooperate with exam resulting in an abbreviated exam which showed no restricted diffusion to suggest acute or subacute infarct. -EEG study was suggestive of mild diffuse encephalopathy without any specific etiology.  There was no evidence of seizures or epileptic activity. Mental status is back to baseline.  She is okay for discharge from neurologist standpoint. Discussed with Dr. Viviann Spare, neurologist.    Intractable nausea/vomiting/headache/complicated migraine Improved with antiemetics and analgesics.   Hypokalemia/hypomagnesemia Improved    Other comorbidities include generalized anxiety disorder, Hashimoto's thyroiditis, tobacco use disorder, hyperlipidemia, insomnia, folic acid deficiency   Her condition has improved.  She wants to  be discharged home today  because she is feeling better.       Consultants: Neurologist Procedures performed: EEG Disposition: Home Diet recommendation:  Discharge Diet Orders (From admission, onward)     Start     Ordered   2023-06-17 0000  Diet - low sodium heart healthy        June 17, 2023 1432           Cardiac diet DISCHARGE MEDICATION: Allergies as of 06/17/23       Reactions   Atorvastatin Other (See Comments)   Cymbalta [duloxetine Hcl] Other (See Comments)   Makes my head do crazy things   Iodinated Contrast Media Swelling, Other (See Comments)   Facial swelling, pt tried premedication previously still had a reaction.   Metoclopramide Nausea And Vomiting   Rosuvastatin Other (See Comments)   Trazodone And Nefazodone    Gives nightmares   Tape Rash, Swelling, Other (See Comments)   Tolerates paper tape        Medication List     TAKE these medications    ALPRAZolam 0.5 MG tablet Commonly known as: XANAX Take 0.5 mg by mouth in the morning and at bedtime.   budesonide-formoterol 160-4.5 MCG/ACT inhaler Commonly known as: SYMBICORT Inhale 2 puffs into the lungs 2 (two) times daily.   busPIRone 15 MG tablet Commonly known as: BUSPAR Take 0.5 tablets by mouth 2 (two) times daily.   IBU 800 MG tablet Generic drug: ibuprofen Take 800 mg by mouth every 6 (six) hours as needed for mild pain or headache.   MELATONIN GUMMIES PO Take 3-4 capsules by mouth at bedtime.   ondansetron 4 MG disintegrating tablet Commonly known as: ZOFRAN-ODT Take 8 mg by mouth 2 (two) times daily.   oxyCODONE 5 MG immediate release tablet Commonly known as: Oxy IR/ROXICODONE Take 5 mg by mouth See admin instructions. Q4-6 HRS PRN PAIN   pantoprazole 40 MG tablet Commonly known as: PROTONIX Take 1 tablet by mouth 2 (two) times daily.   promethazine 25 MG tablet Commonly known as: PHENERGAN Take 0.5-1 tablets (12.5-25 mg total) by mouth every 6 (six) hours as needed for nausea or vomiting.    Repatha SureClick 140 MG/ML Soaj Generic drug: Evolocumab INJECT 140mg  SUBCUTANEOUSLY EVERY TWO WEEKS AS DIRECTED   Synthroid 137 MCG tablet Generic drug: levothyroxine Take 137 mcg by mouth daily.   tiZANidine 4 MG tablet Commonly known as: ZANAFLEX Take 4 mg by mouth every 6 (six) hours as needed for muscle spasms.   Vitamin D (Ergocalciferol) 1.25 MG (50000 UNIT) Caps capsule Commonly known as: DRISDOL Take 1 capsule (50,000 Units total) by mouth every 7 (seven) days. Start taking on: May 24, 2023 What changed: when to take this        Discharge Exam: Filed Weights   05/17/23 1937  Weight: 67.8 kg   GEN: NAD SKIN: Warm and dry EYES: EOMI ENT: MMM CV: RRR PULM: CTA B ABD: soft, ND, NT, +BS CNS: AAO x 3, non focal EXT: No edema or tenderness   Condition at discharge: good  The results of significant diagnostics from this hospitalization (including imaging, microbiology, ancillary and laboratory) are listed below for reference.   Imaging Studies: EEG adult  Result Date: June 17, 2023 Melanie Quest, MD     06-17-2023  8:55 AM Patient Name: Melanie Shepard MRN: [id number] Epilepsy Attending: Charlsie Shepard Referring Physician/Provider: Rejeana Brock, MD Date: 05/22/2023 Duration: 22.54 mins Patient history: 57 year old female who presented with nausea/vomiting/headaches progressed to  encephalopathy with findings on CT suggestive of PRES versus occipital strokes. EEG to evaluate for seizure. Level of alertness: Awake AEDs during EEG study: Ativan Technical aspects: This EEG study was done with scalp electrodes positioned according to the 10-20 International system of electrode placement. Electrical activity was reviewed with band pass filter of 1-70Hz , sensitivity of 7 uV/mm, display speed of 20mm/sec with a 60Hz  notched filter applied as appropriate. EEG data were recorded continuously and digitally stored.  Video monitoring was available and reviewed as  appropriate. Description: The posterior dominant rhythm consists of 8-9Hz  activity of moderate voltage (25-35 uV) seen predominantly in posterior head regions, symmetric and reactive to eye opening and eye closing. EEG showed intermittent generalized polymorphic 3 to 6 Hz theta-delta slowing. EEG also showed rhythmic 3-6hz  theta-delta slowing admixed with sharp transients in left occipital region although difficult to interpret due to significant movement and electrode artifact. Hyperventilation and photic stimulation were not performed.   EEG was technically difficult due to significant movement and electrode artifact ABNORMALITY - Intermittent slow, generalized IMPRESSION: This technically difficult study is suggestive of mild diffuse encephalopathy, nonspecific etiology. No definite seizures were seen throughout the recording. Additionally EEG showed rhythmic 3-6hz  theta-delta slowing admixed with sharp transients in left occipital region although difficult to interpret due to significant movement and electrode artifact. If suspicion for interictal activity remains a concern, a prolonged study can should be considered. Dr. Viviann Spare was notified. Melanie Shepard   MR ANGIO HEAD WO CONTRAST  Result Date: 05/23/2023 CLINICAL DATA:  Initial evaluation for neuro deficit, stroke suspected. EXAM: MRA NECK WITHOUT CONTRAST MRA HEAD WITHOUT CONTRAST TECHNIQUE: Angiographic images of the Circle of Willis were acquired using MRA technique without intravenous contrast. COMPARISON:  Comparison made with prior MRI from 05/22/2023. FINDINGS: MRA NECK FINDINGS Aortic arch: Examination moderately degraded by motion artifact. Visualized aortic arch normal caliber with standard 3 vessel morphology. No stenosis about the origin of the great vessels. Right carotid system: Right common and internal carotid arteries are patent with antegrade flow. No evidence for dissection. Atheromatous change about the right carotid bulb with  associated stenosis of up to 60-70% by NASCET criteria (series 551, image 13). Difficult to accurately measure this stenosis given motion degradation. Left carotid system: Left common and internal carotid arteries are patent with antegrade flow. No evidence for dissection. No hemodynamically significant stenosis seen about the left carotid bulb or elsewhere about the left carotid artery system. Vertebral arteries: Both vertebral arteries arise from the subclavian arteries. Left vertebral artery strongly dominant. No visible proximal subclavian artery stenosis. Dominant left vertebral artery patent without stenosis or evidence for dissection. Right vertebral artery not well seen at its origin, possibly occluded. Attenuated flow is seen distally within the right V2 and V3 segments, suggesting a degree of distal reconstitution. No visible superimposed stenosis or evidence for dissection. Other: None. MRA HEAD FINDINGS Anterior circulation: Examination degraded by motion artifact. Both internal carotid arteries are patent through the siphons without stenosis or other abnormality. A1 segments patent bilaterally. Normal anterior urine complex. Visualized anterior cerebral arteries patent without visible stenosis. No M1 stenosis or occlusion. No proximal MCA branch occlusion or high-grade stenosis. Distal MCA branches perfused and grossly symmetric. Posterior circulation: Both V4 segments patent without stenosis. Dominant left vertebral artery, with a hypoplastic right vertebral artery. Right PICA patent. Left PICA origin not visualized. Basilar patent without stenosis. Superior cerebral arteries patent bilaterally. Both PCAs primarily supplied via the basilar well perfused or distal aspects without stenosis.  Anatomic variants: Dominant left vertebral artery. Other: No intracranial aneurysm or other vascular malformation. IMPRESSION: MRA HEAD IMPRESSION: Negative intracranial MRA for large vessel occlusion. No  hemodynamically significant or correctable stenosis. MRA NECK IMPRESSION: 1. Motion degraded exam. 2. Estimated 60-70% stenosis at the right carotid bulb/origin of the cervical right ICA. 3. No hemodynamically significant stenosis about the left carotid artery system. 4. Dominant left vertebral artery widely patent within the neck. Right vertebral artery hypoplastic and not well seen at its origin, possibly occluded. Distal reconstitution with attenuated flow within the right V2 and V3 segments. Electronically Signed   By: Rise Mu M.D.   On: 05/23/2023 03:25   MR ANGIO NECK WO CONTRAST  Result Date: 05/23/2023 CLINICAL DATA:  Initial evaluation for neuro deficit, stroke suspected. EXAM: MRA NECK WITHOUT CONTRAST MRA HEAD WITHOUT CONTRAST TECHNIQUE: Angiographic images of the Circle of Willis were acquired using MRA technique without intravenous contrast. COMPARISON:  Comparison made with prior MRI from 05/22/2023. FINDINGS: MRA NECK FINDINGS Aortic arch: Examination moderately degraded by motion artifact. Visualized aortic arch normal caliber with standard 3 vessel morphology. No stenosis about the origin of the great vessels. Right carotid system: Right common and internal carotid arteries are patent with antegrade flow. No evidence for dissection. Atheromatous change about the right carotid bulb with associated stenosis of up to 60-70% by NASCET criteria (series 551, image 13). Difficult to accurately measure this stenosis given motion degradation. Left carotid system: Left common and internal carotid arteries are patent with antegrade flow. No evidence for dissection. No hemodynamically significant stenosis seen about the left carotid bulb or elsewhere about the left carotid artery system. Vertebral arteries: Both vertebral arteries arise from the subclavian arteries. Left vertebral artery strongly dominant. No visible proximal subclavian artery stenosis. Dominant left vertebral artery patent  without stenosis or evidence for dissection. Right vertebral artery not well seen at its origin, possibly occluded. Attenuated flow is seen distally within the right V2 and V3 segments, suggesting a degree of distal reconstitution. No visible superimposed stenosis or evidence for dissection. Other: None. MRA HEAD FINDINGS Anterior circulation: Examination degraded by motion artifact. Both internal carotid arteries are patent through the siphons without stenosis or other abnormality. A1 segments patent bilaterally. Normal anterior urine complex. Visualized anterior cerebral arteries patent without visible stenosis. No M1 stenosis or occlusion. No proximal MCA branch occlusion or high-grade stenosis. Distal MCA branches perfused and grossly symmetric. Posterior circulation: Both V4 segments patent without stenosis. Dominant left vertebral artery, with a hypoplastic right vertebral artery. Right PICA patent. Left PICA origin not visualized. Basilar patent without stenosis. Superior cerebral arteries patent bilaterally. Both PCAs primarily supplied via the basilar well perfused or distal aspects without stenosis. Anatomic variants: Dominant left vertebral artery. Other: No intracranial aneurysm or other vascular malformation. IMPRESSION: MRA HEAD IMPRESSION: Negative intracranial MRA for large vessel occlusion. No hemodynamically significant or correctable stenosis. MRA NECK IMPRESSION: 1. Motion degraded exam. 2. Estimated 60-70% stenosis at the right carotid bulb/origin of the cervical right ICA. 3. No hemodynamically significant stenosis about the left carotid artery system. 4. Dominant left vertebral artery widely patent within the neck. Right vertebral artery hypoplastic and not well seen at its origin, possibly occluded. Distal reconstitution with attenuated flow within the right V2 and V3 segments. Electronically Signed   By: Rise Mu M.D.   On: 05/23/2023 03:25   MR BRAIN WO CONTRAST  Result  Date: 05/22/2023 CLINICAL DATA:  Altered mental status EXAM: MRI HEAD WITHOUT CONTRAST TECHNIQUE: Multiplanar,  multiecho pulse sequences of the brain and surrounding structures were obtained without intravenous contrast. COMPARISON:  07/28/2013 FINDINGS: Evaluation is limited as the patient is altered and could not cooperate with the exam. Despite medication, the exam was terminated after obtaining the axial and coronal diffusion-weighted images and the axial T2 FLAIR, all of which are motion limited. Within these limitations, there is no restricted diffusion to suggest acute or subacute infarct. Motion artifact limits the utility of the FLAIR, however there does not appear to be any non artifactual FLAIR abnormality. IMPRESSION: Evaluation is limited as the patient is altered and could not cooperate with the exam, resulting in an abbreviated exam within these limitations, there is no restricted diffusion to suggest acute or subacute infarct. Electronically Signed   By: Wiliam Ke M.D.   On: 05/22/2023 11:13   CT HEAD WO CONTRAST ( )  Result Date: 05/21/2023 CLINICAL DATA:  Initial evaluation for mental status change, unknown cause. EXAM: CT HEAD WITHOUT CONTRAST TECHNIQUE: Contiguous axial images were obtained from the base of the skull through the vertex without intravenous contrast. RADIATION DOSE REDUCTION: This exam was performed according to the departmental dose-optimization program which includes automated exposure control, adjustment of the mA and/or kV according to patient size and/or use of iterative reconstruction technique. COMPARISON:  Prior CT from 05/18/2023. FINDINGS: Brain: Examination somewhat technically limited by positioning and motion. There is question of patchy hypodensity involving the cortical to subcortical aspects of the bilateral parieto-occipital regions (series 2, images 22, 20, 14), raising the possibility for PRES. Possible faint associated petechial blood products at the  level of the subcortical right parietal lobe (series 5, image 31). No significant mass effect or midline shift. No other acute intracranial hemorrhage or large vessel territory infarct. No other mass lesion or midline shift. No hydrocephalus or extra-axial fluid collection. Probable underlying mild chronic microvascular ischemic disease noted. Vascular: No abnormal hyperdense vessel. Scattered vascular calcifications noted within the carotid siphons. Skull: Scalp soft tissues and calvarium demonstrate no acute finding. Sinuses/Orbits: Globes and orbital soft tissues within normal limits. Paranasal sinuses are clear. No mastoid effusion. Other: None. IMPRESSION: 1. Mildly motion degraded exam. 2. Question patchy hypodensity involving the cortical to subcortical aspects of the bilateral parieto-occipital regions, raising the possibility for PRES. Possible faint associated petechial blood products at the level of the subcortical right parietal lobe. No significant mass effect or midline shift. Further evaluation with dedicated MRI could be performed for further evaluation as warranted. 3. No other acute intracranial abnormality. Electronically Signed   By: Rise Mu M.D.   On: 05/21/2023 18:37   [place] EKG SITE RITE  Result Date: 05/19/2023 If Site Rite image not attached, placement could not be confirmed due to current cardiac rhythm.  CT ABDOMEN PELVIS WO CONTRAST  Result Date: 05/18/2023 CLINICAL DATA:  Acute nonlocalized abdominal pain. Vomiting, headache, and chills starting 4 hours ago. EXAM: CT ABDOMEN AND PELVIS WITHOUT CONTRAST TECHNIQUE: Multidetector CT imaging of the abdomen and pelvis was performed following the standard protocol without IV contrast. RADIATION DOSE REDUCTION: This exam was performed according to the departmental dose-optimization program which includes automated exposure control, adjustment of the mA and/or kV according to patient size and/or use of iterative reconstruction  technique. COMPARISON:  10/12/2022 and 08/16/2022 FINDINGS: Lower chest: Linear fibrosis or atelectasis in the lung bases. Hepatobiliary: Prominent diffuse fatty infiltration of the liver. No focal lesions. Surgical absence of the gallbladder. No bile duct dilatation. Pancreas: Unremarkable. No pancreatic ductal dilatation or surrounding inflammatory  changes. Spleen: Normal in size without focal abnormality. Adrenals/Urinary Tract: Adrenal glands are unremarkable. Kidneys are normal, without renal calculi, focal lesion, or hydronephrosis. Bladder is unremarkable. Stomach/Bowel: Stomach, small bowel, and colon are not abnormally distended. Scattered stool throughout the colon. No wall thickening or inflammatory changes. Appendix is not identified. Vascular/Lymphatic: Aortic atherosclerosis. No enlarged abdominal or pelvic lymph nodes. Reproductive: No pelvic mass. Other: No abdominal wall hernia or abnormality. No abdominopelvic ascites. Musculoskeletal: No acute or significant osseous findings. IMPRESSION: 1. No evidence of bowel obstruction or inflammation. 2. Diffuse fatty infiltration of the liver. 3. Aortic atherosclerosis. Electronically Signed   By: Burman Nieves M.D.   On: 05/18/2023 01:53   CT HEAD WO CONTRAST ( )  Result Date: 05/18/2023 CLINICAL DATA:  New onset headaches. Vomiting and chills starting 4 hours ago. EXAM: CT HEAD WITHOUT CONTRAST TECHNIQUE: Contiguous axial images were obtained from the base of the skull through the vertex without intravenous contrast. RADIATION DOSE REDUCTION: This exam was performed according to the departmental dose-optimization program which includes automated exposure control, adjustment of the mA and/or kV according to patient size and/or use of iterative reconstruction technique. COMPARISON:  CT head 09/11/2013.  MRI brain 07/28/2013 FINDINGS: Brain: No evidence of acute infarction, hemorrhage, hydrocephalus, extra-axial collection or mass lesion/mass effect.  Vascular: No hyperdense vessel or unexpected calcification. Skull: Normal. Negative for fracture or focal lesion. Sinuses/Orbits: No acute finding. Other: None. IMPRESSION: No acute intracranial abnormalities. Electronically Signed   By: Burman Nieves M.D.   On: 05/18/2023 01:49    Microbiology: Results for orders placed or performed during the hospital encounter of 08/16/22  Urine Culture     Status: Abnormal   Collection Time: 08/16/22  3:26 PM   Specimen: Urine, Clean Catch  Result Value Ref Range Status   Specimen Description   Final    URINE, CLEAN CATCH Performed at [facility], [address] [place], [place] [address]    Special Requests   Final    NONE Performed at [facility], [address], [place], [place] [address]    Culture (A)  Final    <10,000 COLONIES/mL INSIGNIFICANT GROWTH Performed at [facility], [address], [place], [place] [address]    Report Status 08/17/2022 FINAL  Final    Labs: CBC: Recent Labs  Lab 05/17/23 2020 05/18/23 0503 05/19/23 0354 05/22/23 0900 05/23/23 0351  WBC 12.0* 10.3 8.8 12.1* 12.2*  NEUTROABS 8.1*  --   --  7.1  --   HGB 14.6 13.7 12.8 12.3 12.3  HCT 43.4 42.1 38.8 36.5 36.4  MCV 99.8 103.7* 101.0* 96.3 96.8  PLT 264 211 192 260 237   Basic Metabolic Panel: Recent Labs  Lab 05/18/23 0503 05/19/23 0354 05/20/23 0339 05/21/23 0800 05/22/23 0900 05/23/23 0351  NA 139 138 138 131* 136 134*  K 3.9 3.0* 3.3* 3.6 3.1* 3.4*  CL 105 101 101 96* 99 99  CO2 23 26 28 26 24 24   GLUCOSE 136* 97 91 115* 95 113*  BUN 10 6 6 6 6 7   CREATININE 0.80 0.68 0.70 0.85 0.91 0.84  CALCIUM 8.3* 8.8* 8.4* 8.7* 8.7* 8.2*  MG 1.8 1.7 2.2 1.8 1.5* 2.8*  PHOS 3.8  --   --   --  3.8  --    Liver Function Tests: Recent Labs  Lab 05/17/23 2020 05/18/23 0503 05/21/23 1053 05/22/23 0900 05/23/23 0351  AST 28 29 27 24  32  ALT 17 17 19 18  18  ALKPHOS 127* 106 91 78 78  BILITOT 0.6 0.6 1.4* 1.6* 1.0  PROT 8.3* 7.1 6.8 6.4*  6.4*  ALBUMIN 4.3 3.8 3.7 3.5 3.3*   CBG: Recent Labs  Lab 05/21/23 1752  GLUCAP 99    Discharge time spent: greater than 30 minutes.  Signed: Lurene Shadow, MD [facility] 05/23/2023

## 2023-05-23 NOTE — Progress Notes (Signed)
RA DL PICC removed per protocol per MD order. Manual pressure applied for 5 mins. Vaseline gauze, gauze, and Tegaderm applied over insertion site. No bleeding or swelling noted. Instructed patient to remain in bed for thirty mins. Educated patient about S/S of infection and when to call MD; no heavy lifting or pressure on right side for 24 hours; keep dressing dry and intact for 24 hours. Pt verbalized comprehension.  

## 2023-10-22 ENCOUNTER — Inpatient Hospital Stay (HOSPITAL_COMMUNITY)
Admission: EM | Admit: 2023-10-22 | Discharge: 2023-10-24 | DRG: 392 | Disposition: A | Discharge status: Home / Self Care | Payer: Medicare Other | Attending: Family Medicine | Admitting: Family Medicine

## 2023-10-22 ENCOUNTER — Emergency Department (HOSPITAL_COMMUNITY): Payer: Medicare Other

## 2023-10-22 ENCOUNTER — Other Ambulatory Visit: Payer: Self-pay

## 2023-10-22 ENCOUNTER — Encounter (HOSPITAL_COMMUNITY): Payer: Self-pay

## 2023-10-22 DIAGNOSIS — Z79890 Hormone replacement therapy (postmenopausal): Secondary | ICD-10-CM

## 2023-10-22 DIAGNOSIS — E039 Hypothyroidism, unspecified: Secondary | ICD-10-CM | POA: Diagnosis present

## 2023-10-22 DIAGNOSIS — K219 Gastro-esophageal reflux disease without esophagitis: Secondary | ICD-10-CM | POA: Diagnosis present

## 2023-10-22 DIAGNOSIS — I2510 Atherosclerotic heart disease of native coronary artery without angina pectoris: Secondary | ICD-10-CM | POA: Diagnosis present

## 2023-10-22 DIAGNOSIS — F411 Generalized anxiety disorder: Secondary | ICD-10-CM | POA: Diagnosis present

## 2023-10-22 DIAGNOSIS — K529 Noninfective gastroenteritis and colitis, unspecified: Principal | ICD-10-CM | POA: Diagnosis present

## 2023-10-22 DIAGNOSIS — R110 Nausea: Secondary | ICD-10-CM | POA: Diagnosis present

## 2023-10-22 DIAGNOSIS — F17210 Nicotine dependence, cigarettes, uncomplicated: Secondary | ICD-10-CM | POA: Diagnosis present

## 2023-10-22 DIAGNOSIS — E876 Hypokalemia: Secondary | ICD-10-CM | POA: Diagnosis not present

## 2023-10-22 DIAGNOSIS — G9341 Metabolic encephalopathy: Secondary | ICD-10-CM | POA: Insufficient documentation

## 2023-10-22 DIAGNOSIS — J449 Chronic obstructive pulmonary disease, unspecified: Secondary | ICD-10-CM | POA: Diagnosis present

## 2023-10-22 DIAGNOSIS — Z720 Tobacco use: Secondary | ICD-10-CM | POA: Diagnosis present

## 2023-10-22 DIAGNOSIS — Z888 Allergy status to other drugs, medicaments and biological substances status: Secondary | ICD-10-CM

## 2023-10-22 DIAGNOSIS — Z8249 Family history of ischemic heart disease and other diseases of the circulatory system: Secondary | ICD-10-CM

## 2023-10-22 DIAGNOSIS — Z91048 Other nonmedicinal substance allergy status: Secondary | ICD-10-CM

## 2023-10-22 DIAGNOSIS — R1010 Upper abdominal pain, unspecified: Principal | ICD-10-CM

## 2023-10-22 DIAGNOSIS — Z79899 Other long term (current) drug therapy: Secondary | ICD-10-CM

## 2023-10-22 DIAGNOSIS — E782 Mixed hyperlipidemia: Secondary | ICD-10-CM | POA: Diagnosis present

## 2023-10-22 DIAGNOSIS — Z7951 Long term (current) use of inhaled steroids: Secondary | ICD-10-CM

## 2023-10-22 LAB — COMPREHENSIVE METABOLIC PANEL
ALT: 10 U/L (ref 0–44)
AST: 17 U/L (ref 15–41)
Albumin: 4.7 g/dL (ref 3.5–5.0)
Alkaline Phosphatase: 114 U/L (ref 38–126)
Anion gap: 14 (ref 5–15)
BUN: 9 mg/dL (ref 6–20)
CO2: 22 mmol/L (ref 22–32)
Calcium: 9.9 mg/dL (ref 8.9–10.3)
Chloride: 103 mmol/L (ref 98–111)
Creatinine, Ser: 0.79 mg/dL (ref 0.44–1.00)
GFR, Estimated: >60 mL/min (ref >60)
Glucose, Bld: 161 mg/dL — ABNORMAL HIGH (ref 70–99)
Potassium: 3.5 mmol/L (ref 3.5–5.1)
Sodium: 139 mmol/L (ref 135–145)
Total Bilirubin: 1 mg/dL (ref <1.2)
Total Protein: 8.7 g/dL — ABNORMAL HIGH (ref 6.5–8.1)

## 2023-10-22 LAB — CBC
HCT: 46 % (ref 36.0–46.0)
Hemoglobin: 15.6 g/dL — ABNORMAL HIGH (ref 12.0–15.0)
MCH: 34.7 pg — ABNORMAL HIGH (ref 26.0–34.0)
MCHC: 33.9 g/dL (ref 30.0–36.0)
MCV: 102.4 fL — ABNORMAL HIGH (ref 80.0–100.0)
Platelets: 287 10*3/uL (ref 150–400)
RBC: 4.49 MIL/uL (ref 3.87–5.11)
RDW: 14.4 % (ref 11.5–15.5)
WBC: 12.3 10*3/uL — ABNORMAL HIGH (ref 4.0–10.5)
nRBC: 0 % (ref 0.0–0.2)

## 2023-10-22 LAB — LIPASE, BLOOD: Lipase: 37 U/L (ref 11–51)

## 2023-10-22 MED ORDER — HALOPERIDOL LACTATE 5 MG/ML IJ SOLN
5.0000 mg | Freq: Once | INTRAMUSCULAR | Status: AC
  Administered 2023-10-22: 5 mg via INTRAVENOUS
  Filled 2023-10-22: qty 1

## 2023-10-22 MED ORDER — CIPROFLOXACIN IN D5W 400 MG/200ML IV SOLN
400.0000 mg | Freq: Once | INTRAVENOUS | Status: AC
  Administered 2023-10-23: 400 mg via INTRAVENOUS
  Filled 2023-10-22: qty 200

## 2023-10-22 MED ORDER — ONDANSETRON HCL 4 MG/2ML IJ SOLN
4.0000 mg | Freq: Once | INTRAMUSCULAR | Status: AC
  Administered 2023-10-22: 4 mg via INTRAVENOUS
  Filled 2023-10-22: qty 2

## 2023-10-22 MED ORDER — HYDROMORPHONE HCL 1 MG/ML IJ SOLN
0.5000 mg | Freq: Once | INTRAMUSCULAR | Status: AC
  Administered 2023-10-22: 0.5 mg via INTRAVENOUS
  Filled 2023-10-22: qty 0.5

## 2023-10-22 MED ORDER — SODIUM CHLORIDE 0.9 % IV BOLUS
2000.0000 mL | Freq: Once | INTRAVENOUS | Status: AC
  Administered 2023-10-22: 2000 mL via INTRAVENOUS

## 2023-10-22 MED ORDER — PROMETHAZINE HCL 25 MG RE SUPP: 25.0000 mg | Freq: Four times a day (QID) | RECTAL | 0 refills | Status: DC | PRN

## 2023-10-22 MED ORDER — PROMETHAZINE HCL 25 MG/ML IJ SOLN
25.0000 mg | Freq: Once | INTRAMUSCULAR | Status: AC
  Administered 2023-10-22: 25 mg via INTRAMUSCULAR
  Filled 2023-10-22: qty 1

## 2023-10-22 MED ORDER — LORAZEPAM 2 MG/ML IJ SOLN
0.5000 mg | Freq: Once | INTRAMUSCULAR | Status: AC
  Administered 2023-10-22: 0.5 mg via INTRAVENOUS
  Filled 2023-10-22: qty 1

## 2023-10-22 MED ORDER — PROCHLORPERAZINE EDISYLATE 10 MG/2ML IJ SOLN
10.0000 mg | Freq: Once | INTRAMUSCULAR | Status: AC
  Administered 2023-10-22: 10 mg via INTRAVENOUS
  Filled 2023-10-22: qty 2

## 2023-10-22 MED ORDER — PANTOPRAZOLE SODIUM 40 MG IV SOLR
40.0000 mg | Freq: Once | INTRAVENOUS | Status: AC
  Administered 2023-10-22: 40 mg via INTRAVENOUS
  Filled 2023-10-22: qty 10

## 2023-10-22 MED ORDER — METRONIDAZOLE 500 MG/100ML IV SOLN
500.0000 mg | Freq: Once | INTRAVENOUS | Status: AC
  Administered 2023-10-23: 500 mg via INTRAVENOUS
  Filled 2023-10-22: qty 100

## 2023-10-22 NOTE — ED Provider Notes (Incomplete)
[place] EMERGENCY DEPARTMENT AT [facility] Provider Note   CSN: [id number] Arrival date & time: 10/22/23  1455     History {Add pertinent medical, surgical, social history, OB history to HPI:1} Chief Complaint  Patient presents with  . Emesis    Melanie Shepard is a 57 y.o. female.  Patient has a history of abdominal pain and vomiting.  She comes in today with similar symptoms.  No fever no chills no diarrhea  The history is provided by the patient and medical records. No language interpreter was used.  Emesis Severity:  Moderate Timing:  Constant Quality:  Bilious material Able to tolerate:  Liquids Progression:  Unchanged Chronicity:  Recurrent Recent urination:  Increased Context: not post-tussive   Relieved by:  Nothing Worsened by:  Nothing Ineffective treatments:  None tried Associated symptoms: abdominal pain   Associated symptoms: no cough, no diarrhea and no headaches        Home Medications Prior to Admission medications   Medication Sig Start Date End Date Taking? Authorizing Provider  ALPRAZolam Prudy Feeler) 0.5 MG tablet Take 0.5 mg by mouth in the morning and at bedtime. 11/03/19   [provider]  budesonide-formoterol (SYMBICORT) 160-4.5 MCG/ACT inhaler Inhale 2 puffs into the lungs 2 (two) times daily. 05/10/22   [provider]  busPIRone (BUSPAR) 15 MG tablet Take 0.5 tablets by mouth 2 (two) times daily.    [provider]  Evolocumab (REPATHA SURECLICK) 140 MG/ML SOAJ INJECT 140mg  SUBCUTANEOUSLY EVERY TWO WEEKS AS DIRECTED    [provider]  ibuprofen (IBU) 800 MG tablet Take 800 mg by mouth every 6 (six) hours as needed for mild pain or headache.    [provider]  MELATONIN GUMMIES PO Take 3-4 capsules by mouth at bedtime.    [provider]  ondansetron (ZOFRAN-ODT) 4 MG disintegrating tablet Take 8 mg by mouth 2 (two) times daily.    [provider]  oxyCODONE (OXY  IR/ROXICODONE) 5 MG immediate release tablet Take 5 mg by mouth See admin instructions. Q4-6 HRS PRN PAIN    [provider]  pantoprazole (PROTONIX) 40 MG tablet Take 1 tablet by mouth 2 (two) times daily.    [provider]  promethazine (PHENERGAN) 25 MG tablet Take 0.5-1 tablets (12.5-25 mg total) by mouth every 6 (six) hours as needed for nausea or vomiting. 11/13/22   Aida Raider, NP  SYNTHROID 137 MCG tablet Take 137 mcg by mouth daily. 08/15/22   [provider]  tiZANidine (ZANAFLEX) 4 MG tablet Take 4 mg by mouth every 6 (six) hours as needed for muscle spasms. 05/17/23   [provider]  Vitamin D, Ergocalciferol, (DRISDOL) 1.25 MG (50000 UNIT) CAPS capsule Take 1 capsule (50,000 Units total) by mouth every 7 (seven) days. 05/24/23   Lurene Shadow, MD      Allergies    Atorvastatin, Cymbalta [duloxetine hcl], Iodinated contrast media, Metoclopramide, Rosuvastatin, Trazodone and nefazodone, and Tape    Review of Systems   Review of Systems  Constitutional:  Negative for appetite change and fatigue.  HENT:  Negative for congestion, ear discharge and sinus pressure.   Eyes:  Negative for discharge.  Respiratory:  Negative for cough.   Cardiovascular:  Negative for chest pain.  Gastrointestinal:  Positive for abdominal pain and vomiting. Negative for diarrhea.  Genitourinary:  Negative for frequency and hematuria.  Musculoskeletal:  Negative for back pain.  Skin:  Negative for rash.  Neurological:  Negative for seizures  and headaches.  Psychiatric/Behavioral:  Negative for hallucinations.     Physical Exam Updated Vital Signs BP (!) 142/80   Pulse 72   Temp 97.8 F (36.6 C)   Resp 17   Ht 5\' 7"  (1.702 m)   Wt 68 kg   SpO2 95%   BMI 23.49 kg/m  Physical Exam Vitals and nursing note reviewed.  Constitutional:      Appearance: She is well-developed.  HENT:     Head: Normocephalic.     Nose: Nose normal.  Eyes:     General: No  scleral icterus.    Conjunctiva/sclera: Conjunctivae normal.  Neck:     Thyroid: No thyromegaly.  Cardiovascular:     Rate and Rhythm: Normal rate and regular rhythm.     Heart sounds: No murmur heard.    No friction rub. No gallop.  Pulmonary:     Breath sounds: No stridor. No wheezing or rales.  Chest:     Chest wall: No tenderness.  Abdominal:     General: There is no distension.     Tenderness: There is abdominal tenderness. There is no rebound.  Musculoskeletal:        General: Normal range of motion.     Cervical back: Neck supple.  Lymphadenopathy:     Cervical: No cervical adenopathy.  Skin:    Findings: No erythema or rash.  Neurological:     Mental Status: She is alert and oriented to person, place, and time.     Motor: No abnormal muscle tone.     Coordination: Coordination normal.  Psychiatric:        Behavior: Behavior normal.     ED Results / Procedures / Treatments   Labs (all labs ordered are listed, but only abnormal results are displayed) Labs Reviewed  COMPREHENSIVE METABOLIC PANEL - Abnormal; Notable for the following components:      Result Value   Glucose, Bld 161 (*)    Total Protein 8.7 (*)    All other components within normal limits  CBC - Abnormal; Notable for the following components:   WBC 12.3 (*)    Hemoglobin 15.6 (*)    MCV 102.4 (*)    MCH 34.7 (*)    All other components within normal limits  LIPASE, BLOOD  URINALYSIS, ROUTINE W REFLEX MICROSCOPIC  RAPID URINE DRUG SCREEN, HOSP PERFORMED    EKG EKG Interpretation Date/Time:  Monday October 22 2023 15:10:11 EST Ventricular Rate:  72 PR Interval:  152 QRS Duration:  88 QT Interval:  424 QTC Calculation: 464 R Axis:   76  Text Interpretation: Normal sinus rhythm Normal ECG When compared with ECG of 18-May-2023 04:07, PREVIOUS ECG IS PRESENT Confirmed by Bethann Berkshire [phone]) on 10/22/2023 3:12:54 PM  Radiology No results found.  Procedures Procedures  {Document  cardiac monitor, telemetry assessment procedure when appropriate:1}  Medications Ordered in ED Medications  sodium chloride 0.9 % bolus 2,000 mL (0 mLs Intravenous Stopped 10/22/23 1909)  ondansetron (ZOFRAN) injection 4 mg (4 mg Intravenous Given 10/22/23 1610)  LORazepam (ATIVAN) injection 0.5 mg (0.5 mg Intravenous Given 10/22/23 1611)  HYDROmorphone (DILAUDID) injection 0.5 mg (0.5 mg Intravenous Given 10/22/23 1613)  pantoprazole (PROTONIX) injection 40 mg (40 mg Intravenous Given 10/22/23 1622)  HYDROmorphone (DILAUDID) injection 0.5 mg (0.5 mg Intravenous Given 10/22/23 1730)  prochlorperazine (COMPAZINE) injection 10 mg (10 mg Intravenous Given 10/22/23 1729)  ondansetron (ZOFRAN) injection 4 mg (4 mg Intravenous Given 10/22/23 1939)  HYDROmorphone (DILAUDID) injection 0.5 mg (  0.5 mg Intravenous Given 10/22/23 1939)  haloperidol lactate (HALDOL) injection 5 mg (5 mg Intravenous Given 10/22/23 2138)    ED Course/ Medical Decision Making/ A&P   {   Click here for ABCD2, HEART and other calculatorsREFRESH Note before signing :1}                              Medical Decision Making Amount and/or Complexity of Data Reviewed Labs: ordered. Radiology: ordered.  Risk Prescription drug management. Decision regarding hospitalization.   Patient with vomiting and abdominal pain.  She left AMA because she did not wait any longer for the results of her tests..  Before the patient actually left the emergency department she vomited again and decided she wanted to stay until her CT scan comes back.  Patient was given a dose of Phenergan.   Patient with colitis she will be admitted to medicine  {Document critical care time when appropriate:1} {Document review of labs and clinical decision tools ie heart score, Chads2Vasc2 etc:1}  {Document your independent review of radiology images, and any outside records:1} {Document your discussion with family members, caretakers, and with  consultants:1} {Document social determinants of health affecting pt's care:1} {Document your decision making why or why not admission, treatments were needed:1} Final Clinical Impression(s) / ED Diagnoses Final diagnoses:  Pain of upper abdomen    Rx / DC Orders ED Discharge Orders     None

## 2023-10-22 NOTE — Discharge Instructions (Signed)
Follow-up with your family doctor this week for recheck.  You will be sent home with some nausea medicine.  Only take liquids for the first 24 hours.  You have also been referred back to a GI doctor for another consult

## 2023-10-22 NOTE — ED Notes (Signed)
Patient transported to CT 

## 2023-10-22 NOTE — ED Notes (Signed)
Pt states she is tired of waiting for results and wants to go home. EDP spoke with pt about leaving.

## 2023-10-22 NOTE — ED Triage Notes (Signed)
Pt to er, pt states that last night she started vomiting last night, states that she has a hx of cyclic vomiting. States that she is here for vomiting and chest pain. States that her chest pain feels different from her acid reflux

## 2023-10-22 NOTE — ED Provider Notes (Signed)
[place] EMERGENCY DEPARTMENT AT [facility] Provider Note   CSN: [id number] Arrival date & time: 10/22/23  1455     History {Add pertinent medical, surgical, social history, OB history to HPI:1} Chief Complaint  Patient presents with   Emesis    Melanie Shepard is a 57 y.o. female.  Patient has a history of abdominal pain and vomiting.  She comes in today with similar symptoms.  No fever no chills no diarrhea  The history is provided by the patient and medical records. No language interpreter was used.  Emesis Severity:  Moderate Timing:  Constant Quality:  Bilious material Able to tolerate:  Liquids Progression:  Unchanged Chronicity:  Recurrent Recent urination:  Increased Context: not post-tussive   Relieved by:  Nothing Worsened by:  Nothing Ineffective treatments:  None tried Associated symptoms: abdominal pain   Associated symptoms: no cough, no diarrhea and no headaches        Home Medications Prior to Admission medications   Medication Sig Start Date End Date Taking? Authorizing Provider  ALPRAZolam Prudy Feeler) 0.5 MG tablet Take 0.5 mg by mouth in the morning and at bedtime. 11/03/19   [provider]  budesonide-formoterol (SYMBICORT) 160-4.5 MCG/ACT inhaler Inhale 2 puffs into the lungs 2 (two) times daily. 05/10/22   [provider]  busPIRone (BUSPAR) 15 MG tablet Take 0.5 tablets by mouth 2 (two) times daily.    [provider]  Evolocumab (REPATHA SURECLICK) 140 MG/ML SOAJ INJECT 140mg  SUBCUTANEOUSLY EVERY TWO WEEKS AS DIRECTED    [provider]  ibuprofen (IBU) 800 MG tablet Take 800 mg by mouth every 6 (six) hours as needed for mild pain or headache.    [provider]  MELATONIN GUMMIES PO Take 3-4 capsules by mouth at bedtime.    [provider]  ondansetron (ZOFRAN-ODT) 4 MG disintegrating tablet Take 8 mg by mouth 2 (two) times daily.    [provider]  oxyCODONE (OXY  IR/ROXICODONE) 5 MG immediate release tablet Take 5 mg by mouth See admin instructions. Q4-6 HRS PRN PAIN    [provider]  pantoprazole (PROTONIX) 40 MG tablet Take 1 tablet by mouth 2 (two) times daily.    [provider]  promethazine (PHENERGAN) 25 MG tablet Take 0.5-1 tablets (12.5-25 mg total) by mouth every 6 (six) hours as needed for nausea or vomiting. 11/13/22   Aida Raider, NP  SYNTHROID 137 MCG tablet Take 137 mcg by mouth daily. 08/15/22   [provider]  tiZANidine (ZANAFLEX) 4 MG tablet Take 4 mg by mouth every 6 (six) hours as needed for muscle spasms. 05/17/23   [provider]  Vitamin D, Ergocalciferol, (DRISDOL) 1.25 MG (50000 UNIT) CAPS capsule Take 1 capsule (50,000 Units total) by mouth every 7 (seven) days. 05/24/23   Lurene Shadow, MD      Allergies    Atorvastatin, Cymbalta [duloxetine hcl], Iodinated contrast media, Metoclopramide, Rosuvastatin, Trazodone and nefazodone, and Tape    Review of Systems   Review of Systems  Constitutional:  Negative for appetite change and fatigue.  HENT:  Negative for congestion, ear discharge and sinus pressure.   Eyes:  Negative for discharge.  Respiratory:  Negative for cough.   Cardiovascular:  Negative for chest pain.  Gastrointestinal:  Positive for abdominal pain and vomiting. Negative for diarrhea.  Genitourinary:  Negative for frequency and hematuria.  Musculoskeletal:  Negative for back pain.  Skin:  Negative for rash.  Neurological:  Negative for seizures  and headaches.  Psychiatric/Behavioral:  Negative for hallucinations.     Physical Exam Updated Vital Signs BP (!) 142/80   Pulse 72   Temp 97.8 F (36.6 C)   Resp 17   Ht 5\' 7"  (1.702 m)   Wt 68 kg   SpO2 95%   BMI 23.49 kg/m  Physical Exam Vitals and nursing note reviewed.  Constitutional:      Appearance: She is well-developed.  HENT:     Head: Normocephalic.     Nose: Nose normal.  Eyes:     General: No  scleral icterus.    Conjunctiva/sclera: Conjunctivae normal.  Neck:     Thyroid: No thyromegaly.  Cardiovascular:     Rate and Rhythm: Normal rate and regular rhythm.     Heart sounds: No murmur heard.    No friction rub. No gallop.  Pulmonary:     Breath sounds: No stridor. No wheezing or rales.  Chest:     Chest wall: No tenderness.  Abdominal:     General: There is no distension.     Tenderness: There is abdominal tenderness. There is no rebound.  Musculoskeletal:        General: Normal range of motion.     Cervical back: Neck supple.  Lymphadenopathy:     Cervical: No cervical adenopathy.  Skin:    Findings: No erythema or rash.  Neurological:     Mental Status: She is alert and oriented to person, place, and time.     Motor: No abnormal muscle tone.     Coordination: Coordination normal.  Psychiatric:        Behavior: Behavior normal.     ED Results / Procedures / Treatments   Labs (all labs ordered are listed, but only abnormal results are displayed) Labs Reviewed  COMPREHENSIVE METABOLIC PANEL - Abnormal; Notable for the following components:      Result Value   Glucose, Bld 161 (*)    Total Protein 8.7 (*)    All other components within normal limits  CBC - Abnormal; Notable for the following components:   WBC 12.3 (*)    Hemoglobin 15.6 (*)    MCV 102.4 (*)    MCH 34.7 (*)    All other components within normal limits  LIPASE, BLOOD  URINALYSIS, ROUTINE W REFLEX MICROSCOPIC  RAPID URINE DRUG SCREEN, HOSP PERFORMED    EKG EKG Interpretation Date/Time:  Monday October 22 2023 15:10:11 EST Ventricular Rate:  72 PR Interval:  152 QRS Duration:  88 QT Interval:  424 QTC Calculation: 464 R Axis:   76  Text Interpretation: Normal sinus rhythm Normal ECG When compared with ECG of 18-May-2023 04:07, PREVIOUS ECG IS PRESENT Confirmed by Bethann Berkshire [phone]) on 10/22/2023 3:12:54 PM  Radiology No results found.  Procedures Procedures  {Document  cardiac monitor, telemetry assessment procedure when appropriate:1}  Medications Ordered in ED Medications  sodium chloride 0.9 % bolus 2,000 mL (0 mLs Intravenous Stopped 10/22/23 1909)  ondansetron (ZOFRAN) injection 4 mg (4 mg Intravenous Given 10/22/23 1610)  LORazepam (ATIVAN) injection 0.5 mg (0.5 mg Intravenous Given 10/22/23 1611)  HYDROmorphone (DILAUDID) injection 0.5 mg (0.5 mg Intravenous Given 10/22/23 1613)  pantoprazole (PROTONIX) injection 40 mg (40 mg Intravenous Given 10/22/23 1622)  HYDROmorphone (DILAUDID) injection 0.5 mg (0.5 mg Intravenous Given 10/22/23 1730)  prochlorperazine (COMPAZINE) injection 10 mg (10 mg Intravenous Given 10/22/23 1729)  ondansetron (ZOFRAN) injection 4 mg (4 mg Intravenous Given 10/22/23 1939)  HYDROmorphone (DILAUDID) injection 0.5 mg (  0.5 mg Intravenous Given 10/22/23 1939)  haloperidol lactate (HALDOL) injection 5 mg (5 mg Intravenous Given 10/22/23 2138)    ED Course/ Medical Decision Making/ A&P   {   Click here for ABCD2, HEART and other calculatorsREFRESH Note before signing :1}                              Medical Decision Making Amount and/or Complexity of Data Reviewed Labs: ordered. Radiology: ordered.  Risk Prescription drug management.   Patient with vomiting and abdominal pain.  She left AMA because she did not wait any longer for the results of her tests..  Before the patient actually left the emergency department she vomited again and decided she wanted to stay until her CT scan comes back.  Patient was given a dose of Phenergan.  {Document critical care time when appropriate:1} {Document review of labs and clinical decision tools ie heart score, Chads2Vasc2 etc:1}  {Document your independent review of radiology images, and any outside records:1} {Document your discussion with family members, caretakers, and with consultants:1} {Document social determinants of health affecting pt's care:1} {Document your decision  making why or why not admission, treatments were needed:1} Final Clinical Impression(s) / ED Diagnoses Final diagnoses:  Pain of upper abdomen    Rx / DC Orders ED Discharge Orders     None

## 2023-10-22 NOTE — ED Notes (Signed)
Pt started vomiting while leaving AMA and changed her mind (not wanting to leave). EDP notified.

## 2023-10-23 DIAGNOSIS — Z79899 Other long term (current) drug therapy: Secondary | ICD-10-CM | POA: Diagnosis not present

## 2023-10-23 DIAGNOSIS — J449 Chronic obstructive pulmonary disease, unspecified: Secondary | ICD-10-CM | POA: Diagnosis present

## 2023-10-23 DIAGNOSIS — E782 Mixed hyperlipidemia: Secondary | ICD-10-CM

## 2023-10-23 DIAGNOSIS — G9341 Metabolic encephalopathy: Secondary | ICD-10-CM | POA: Diagnosis not present

## 2023-10-23 DIAGNOSIS — R110 Nausea: Secondary | ICD-10-CM | POA: Diagnosis present

## 2023-10-23 DIAGNOSIS — Z7951 Long term (current) use of inhaled steroids: Secondary | ICD-10-CM | POA: Diagnosis not present

## 2023-10-23 DIAGNOSIS — Z720 Tobacco use: Secondary | ICD-10-CM

## 2023-10-23 DIAGNOSIS — Z79890 Hormone replacement therapy (postmenopausal): Secondary | ICD-10-CM | POA: Diagnosis not present

## 2023-10-23 DIAGNOSIS — K219 Gastro-esophageal reflux disease without esophagitis: Secondary | ICD-10-CM

## 2023-10-23 DIAGNOSIS — I2510 Atherosclerotic heart disease of native coronary artery without angina pectoris: Secondary | ICD-10-CM | POA: Diagnosis present

## 2023-10-23 DIAGNOSIS — Z8249 Family history of ischemic heart disease and other diseases of the circulatory system: Secondary | ICD-10-CM | POA: Diagnosis not present

## 2023-10-23 DIAGNOSIS — F411 Generalized anxiety disorder: Secondary | ICD-10-CM

## 2023-10-23 DIAGNOSIS — F17210 Nicotine dependence, cigarettes, uncomplicated: Secondary | ICD-10-CM | POA: Diagnosis present

## 2023-10-23 DIAGNOSIS — E063 Autoimmune thyroiditis: Secondary | ICD-10-CM

## 2023-10-23 DIAGNOSIS — Z888 Allergy status to other drugs, medicaments and biological substances status: Secondary | ICD-10-CM | POA: Diagnosis not present

## 2023-10-23 DIAGNOSIS — K529 Noninfective gastroenteritis and colitis, unspecified: Secondary | ICD-10-CM

## 2023-10-23 DIAGNOSIS — Z91048 Other nonmedicinal substance allergy status: Secondary | ICD-10-CM | POA: Diagnosis not present

## 2023-10-23 DIAGNOSIS — E876 Hypokalemia: Secondary | ICD-10-CM | POA: Diagnosis not present

## 2023-10-23 DIAGNOSIS — E039 Hypothyroidism, unspecified: Secondary | ICD-10-CM | POA: Diagnosis present

## 2023-10-23 LAB — CBC WITH DIFFERENTIAL/PLATELET
Abs Immature Granulocytes: 0 10*3/uL (ref 0.00–0.07)
Band Neutrophils: 2 %
Basophils Absolute: 0.1 10*3/uL (ref 0.0–0.1)
Basophils Relative: 1 %
Eosinophils Absolute: 0 10*3/uL (ref 0.0–0.5)
Eosinophils Relative: 0 %
HCT: 37.7 % (ref 36.0–46.0)
Hemoglobin: 12.4 g/dL (ref 12.0–15.0)
Lymphocytes Relative: 40 %
Lymphs Abs: 4.4 10*3/uL — ABNORMAL HIGH (ref 0.7–4.0)
MCH: 34.4 pg — ABNORMAL HIGH (ref 26.0–34.0)
MCHC: 32.9 g/dL (ref 30.0–36.0)
MCV: 104.7 fL — ABNORMAL HIGH (ref 80.0–100.0)
Monocytes Absolute: 0.1 10*3/uL (ref 0.1–1.0)
Monocytes Relative: 1 %
Neutro Abs: 6.4 10*3/uL (ref 1.7–7.7)
Neutrophils Relative %: 56 %
Platelets: ADEQUATE 10*3/uL (ref 150–400)
RBC: 3.6 MIL/uL — ABNORMAL LOW (ref 3.87–5.11)
RDW: 14.4 % (ref 11.5–15.5)
Smear Review: ADEQUATE
WBC: 11.1 10*3/uL — ABNORMAL HIGH (ref 4.0–10.5)
nRBC: 0 % (ref 0.0–0.2)

## 2023-10-23 LAB — MAGNESIUM: Magnesium: 1.8 mg/dL (ref 1.7–2.4)

## 2023-10-23 LAB — COMPREHENSIVE METABOLIC PANEL
ALT: 11 U/L (ref 0–44)
AST: 18 U/L (ref 15–41)
Albumin: 3.8 g/dL (ref 3.5–5.0)
Alkaline Phosphatase: 85 U/L (ref 38–126)
Anion gap: 13 (ref 5–15)
BUN: 6 mg/dL (ref 6–20)
CO2: 20 mmol/L — ABNORMAL LOW (ref 22–32)
Calcium: 8.8 mg/dL — ABNORMAL LOW (ref 8.9–10.3)
Chloride: 106 mmol/L (ref 98–111)
Creatinine, Ser: 0.77 mg/dL (ref 0.44–1.00)
GFR, Estimated: >60 mL/min (ref >60)
Glucose, Bld: 119 mg/dL — ABNORMAL HIGH (ref 70–99)
Potassium: 3 mmol/L — ABNORMAL LOW (ref 3.5–5.1)
Sodium: 139 mmol/L (ref 135–145)
Total Bilirubin: 0.7 mg/dL (ref <1.2)
Total Protein: 7 g/dL (ref 6.5–8.1)

## 2023-10-23 MED ORDER — BUSPIRONE HCL 5 MG PO TABS
7.5000 mg | ORAL_TABLET | Freq: Two times a day (BID) | ORAL | Status: DC
  Administered 2023-10-23 – 2023-10-24 (×2): 7.5 mg via ORAL
  Filled 2023-10-23 (×4): qty 2

## 2023-10-23 MED ORDER — METRONIDAZOLE 500 MG/100ML IV SOLN
500.0000 mg | Freq: Two times a day (BID) | INTRAVENOUS | Status: DC
  Administered 2023-10-23 (×2): 500 mg via INTRAVENOUS
  Filled 2023-10-23 (×2): qty 100

## 2023-10-23 MED ORDER — SODIUM CHLORIDE 0.9 % IV SOLN: INTRAVENOUS | Status: AC

## 2023-10-23 MED ORDER — ONDANSETRON HCL 4 MG/2ML IJ SOLN
4.0000 mg | Freq: Four times a day (QID) | INTRAMUSCULAR | Status: DC | PRN
  Administered 2023-10-23 – 2023-10-24 (×5): 4 mg via INTRAVENOUS
  Filled 2023-10-23 (×5): qty 2

## 2023-10-23 MED ORDER — ACETAMINOPHEN 650 MG RE SUPP: 650.0000 mg | Freq: Four times a day (QID) | RECTAL | Status: DC | PRN

## 2023-10-23 MED ORDER — ALPRAZOLAM 0.5 MG PO TABS
0.5000 mg | ORAL_TABLET | Freq: Two times a day (BID) | ORAL | Status: DC | PRN
  Administered 2023-10-23 (×2): 0.5 mg via ORAL
  Filled 2023-10-23 (×2): qty 1

## 2023-10-23 MED ORDER — LEVOTHYROXINE SODIUM 137 MCG PO TABS
137.0000 ug | ORAL_TABLET | Freq: Every day | ORAL | Status: DC
  Administered 2023-10-24: 137 ug via ORAL
  Filled 2023-10-23: qty 1

## 2023-10-23 MED ORDER — ACETAMINOPHEN 325 MG PO TABS: 650.0000 mg | ORAL_TABLET | Freq: Four times a day (QID) | ORAL | Status: DC | PRN

## 2023-10-23 MED ORDER — HEPARIN SODIUM (PORCINE) 5000 UNIT/ML IJ SOLN
5000.0000 [IU] | Freq: Three times a day (TID) | INTRAMUSCULAR | Status: DC
  Administered 2023-10-23 (×2): 5000 [IU] via SUBCUTANEOUS
  Filled 2023-10-23 (×4): qty 1

## 2023-10-23 MED ORDER — PANTOPRAZOLE SODIUM 40 MG PO TBEC
40.0000 mg | DELAYED_RELEASE_TABLET | Freq: Two times a day (BID) | ORAL | Status: DC
  Administered 2023-10-23 – 2023-10-24 (×2): 40 mg via ORAL
  Filled 2023-10-23 (×4): qty 1

## 2023-10-23 MED ORDER — DIPHENHYDRAMINE HCL 50 MG/ML IJ SOLN: 50.0000 mg | Freq: Once | INTRAMUSCULAR | Status: DC

## 2023-10-23 MED ORDER — HYDROMORPHONE HCL 1 MG/ML IJ SOLN
1.0000 mg | INTRAMUSCULAR | Status: DC | PRN
  Administered 2023-10-23 – 2023-10-24 (×8): 1 mg via INTRAVENOUS
  Filled 2023-10-23 (×8): qty 1

## 2023-10-23 MED ORDER — ONDANSETRON HCL 4 MG PO TABS
4.0000 mg | ORAL_TABLET | Freq: Four times a day (QID) | ORAL | Status: DC | PRN
  Filled 2023-10-23: qty 1

## 2023-10-23 MED ORDER — CIPROFLOXACIN IN D5W 400 MG/200ML IV SOLN
400.0000 mg | Freq: Two times a day (BID) | INTRAVENOUS | Status: DC
  Administered 2023-10-23 – 2023-10-24 (×3): 400 mg via INTRAVENOUS
  Filled 2023-10-23 (×3): qty 200

## 2023-10-23 MED ORDER — MORPHINE SULFATE (PF) 2 MG/ML IV SOLN
2.0000 mg | INTRAVENOUS | Status: AC | PRN
  Administered 2023-10-23 (×3): 2 mg via INTRAVENOUS
  Filled 2023-10-23 (×3): qty 1

## 2023-10-23 MED ORDER — MOMETASONE FURO-FORMOTEROL FUM 200-5 MCG/ACT IN AERO
2.0000 | INHALATION_SPRAY | Freq: Two times a day (BID) | RESPIRATORY_TRACT | Status: DC
  Administered 2023-10-23 (×2): 2 via RESPIRATORY_TRACT
  Filled 2023-10-23: qty 8.8

## 2023-10-23 MED ORDER — OXYCODONE HCL 5 MG PO TABS
5.0000 mg | ORAL_TABLET | ORAL | Status: DC | PRN
  Administered 2023-10-24: 5 mg via ORAL
  Filled 2023-10-23 (×3): qty 1

## 2023-10-23 NOTE — Assessment & Plan Note (Signed)
-   Patient became agitated, anxious, and almost left the hospital - Likely medication reaction after being given multiple doses of opiates, Compazine, Phenergan - Improved with Haldol - Normal behavior at the time of my exam - Continue to monitor

## 2023-10-23 NOTE — Assessment & Plan Note (Signed)
Continue Protonix °

## 2023-10-23 NOTE — Progress Notes (Signed)
   10/23/23 1042  TOC Brief Assessment  Insurance and Status Reviewed  Patient has primary care physician Yes  Home environment has been reviewed Homr with Spouse  Prior level of function: Independent  Prior/Current Home Services No current home services  Social Determinants of Health Reivew SDOH reviewed no interventions necessary  Readmission risk has been reviewed Yes  Transition of care needs no transition of care needs at this time    Transition of Care Department [facility]) has reviewed patient and no TOC needs have been identified at this time. We will continue to monitor patient advancement through interdisciplinary progression rounds. If new patient transition needs arise, please place a TOC consult.

## 2023-10-23 NOTE — Plan of Care (Signed)
This pt has chronic ab pain. Pt unable to tolerate meds via pill form due to nausea and vomiting. Temporary relief for pain w/Morphine IV q2hrs. Zofran q6 hrs. Tolerated abt well. Clean catch urine specimen needed for routine w/reflex microscopic. Pt unable to provide sample at this time, will strive to give sample today, during dayshift
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
## 2023-10-23 NOTE — Assessment & Plan Note (Signed)
Continue Synthroid °

## 2023-10-23 NOTE — Progress Notes (Signed)
Patient seen and examined; admitted after midnight secondary to abdominal pain and intractable nausea/vomiting.  Workup demonstrating the presence of extensive colitis.  Per chart review patient with chronic history of abdominal pain (which is going to make a little more difficult her analgesic treatment).  Please refer to H&P written by Dr.Zierle-Ghosh further info/details on admission.  Plan: -Continue IV fluid resuscitation -Continue analgesics and antiemetics -Continue bowel rest with n.p.o. status -Follow clinical response. -Continue current IV antibiotics.  Vassie Loll MD [phone]

## 2023-10-23 NOTE — Assessment & Plan Note (Signed)
-   Continue BuSpar and Xanax

## 2023-10-23 NOTE — Plan of Care (Signed)
  Problem: Education: Goal: Knowledge of General Education information will improve Description: Including pain rating scale, medication(s)/side effects and non-pharmacologic comfort measures Outcome: Progressing   Problem: Health Behavior/Discharge Planning: Goal: Ability to manage health-related needs will improve Outcome: Not Met (add Reason)   Problem: Clinical Measurements: Goal: Ability to maintain clinical measurements within normal limits will improve Outcome: Not Met (add Reason) Goal: Diagnostic test results will improve Outcome: Not Met (add Reason)   Problem: Pain Management: Goal: General experience of comfort will improve Outcome: Not Met (add Reason)

## 2023-10-23 NOTE — Assessment & Plan Note (Signed)
-   Secondary to colitis - N.p.o. - Continue antiemetics - Continue to monitor
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
## 2023-10-23 NOTE — ED Notes (Signed)
ED provider was notified to attempt an [place] IV due to the patient being a hard stick. IV needed for admission and medications.

## 2023-10-23 NOTE — Assessment & Plan Note (Signed)
-   Continue Repatha at discharge

## 2023-10-23 NOTE — Assessment & Plan Note (Signed)
-   CT abdomen pelvis shows long segment colitis involving transverse descending and sigmoid colon that is infectious versus inflammatory - Continue Cipro and Flagyl - N.p.o. - Continue to monitor - Continue antiemetics - Continue pain control - 2 L normal saline bolus given in the ED

## 2023-10-23 NOTE — Assessment & Plan Note (Signed)
-   Smokes a pack per day - Declines nicotine patch at this time

## 2023-10-24 LAB — URINALYSIS, ROUTINE W REFLEX MICROSCOPIC
Bilirubin Urine: NEGATIVE
Glucose, UA: NEGATIVE mg/dL
Hgb urine dipstick: NEGATIVE
Ketones, ur: NEGATIVE mg/dL
Nitrite: NEGATIVE
Protein, ur: NEGATIVE mg/dL
Specific Gravity, Urine: 1.005 (ref 1.005–1.030)
pH: 7 (ref 5.0–8.0)

## 2023-10-24 LAB — RAPID URINE DRUG SCREEN, HOSP PERFORMED
Amphetamines: NOT DETECTED
Barbiturates: NOT DETECTED
Benzodiazepines: POSITIVE — AB
Cocaine: NOT DETECTED
Opiates: POSITIVE — AB
Tetrahydrocannabinol: NOT DETECTED

## 2023-10-24 LAB — BASIC METABOLIC PANEL
Anion gap: 5 (ref 5–15)
BUN: 5 mg/dL — ABNORMAL LOW (ref 6–20)
CO2: 24 mmol/L (ref 22–32)
Calcium: 8.4 mg/dL — ABNORMAL LOW (ref 8.9–10.3)
Chloride: 107 mmol/L (ref 98–111)
Creatinine, Ser: 0.67 mg/dL (ref 0.44–1.00)
GFR, Estimated: >60 mL/min (ref >60)
Glucose, Bld: 96 mg/dL (ref 70–99)
Potassium: 2.7 mmol/L — CL (ref 3.5–5.1)
Sodium: 136 mmol/L (ref 135–145)

## 2023-10-24 LAB — CBC
HCT: 38.9 % (ref 36.0–46.0)
Hemoglobin: 12.9 g/dL (ref 12.0–15.0)
MCH: 34.1 pg — ABNORMAL HIGH (ref 26.0–34.0)
MCHC: 33.2 g/dL (ref 30.0–36.0)
MCV: 102.9 fL — ABNORMAL HIGH (ref 80.0–100.0)
Platelets: 209 10*3/uL (ref 150–400)
RBC: 3.78 MIL/uL — ABNORMAL LOW (ref 3.87–5.11)
RDW: 14.2 % (ref 11.5–15.5)
WBC: 9.7 10*3/uL (ref 4.0–10.5)
nRBC: 0 % (ref 0.0–0.2)

## 2023-10-24 LAB — HIV ANTIBODY (ROUTINE TESTING W REFLEX): HIV Screen 4th Generation wRfx: NONREACTIVE

## 2023-10-24 LAB — MAGNESIUM: Magnesium: 1.7 mg/dL (ref 1.7–2.4)

## 2023-10-24 MED ORDER — POTASSIUM CHLORIDE ER 20 MEQ PO TBCR: 20.0000 meq | EXTENDED_RELEASE_TABLET | Freq: Every day | ORAL | 0 refills | Status: DC

## 2023-10-24 MED ORDER — AMOXICILLIN-POT CLAVULANATE 875-125 MG PO TABS: 1.0000 | ORAL_TABLET | Freq: Two times a day (BID) | ORAL | 0 refills | Status: AC

## 2023-10-24 MED ORDER — POTASSIUM CHLORIDE CRYS ER 20 MEQ PO TBCR
40.0000 meq | EXTENDED_RELEASE_TABLET | ORAL | Status: AC
  Administered 2023-10-24 (×2): 40 meq via ORAL
  Filled 2023-10-24 (×2): qty 2

## 2023-10-24 MED ORDER — ACETAMINOPHEN 325 MG PO TABS: 650.0000 mg | ORAL_TABLET | Freq: Four times a day (QID) | ORAL | 1 refills | Status: DC | PRN

## 2023-10-24 NOTE — Progress Notes (Signed)
Pt has CRITICAL POTASSIUM: 2.7, notified on-call provider - [place] Carren Rang at [phone]. No new orders visible in Epic.Marland Kitchen

## 2023-10-24 NOTE — Plan of Care (Signed)
Swelling of infiltrated left arm of the [facility] has greatly improved. Swelling has reduced significantly. Advised pt to continue elevating left arm and apply ice pack when needed. Reassessed pt for pain - pain level has reduced. Monitoring pt for the remainder of nursing shift for worsening signs and discomfort.
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
## 2023-10-24 NOTE — Progress Notes (Signed)
Pt stated her IV felt as if it was burning after hanging cipro abt. Checked pt's IV, left upper AC area was slightly swollen, red with a small amount of drainage. Immediately removed IV, held five minutes with gauze and firm pressure. No bloody show noted, wrapped pt arm w/gauze sponge and soft adhesive dressing, then applied ice pack. Contacted pharmacy informing them of situation. Advised to monitor/elevate arm to reduce swelling. Gave pt prn oxy for pain, swelling slowly decreasing, will continue to monitor pt during nursing shift.

## 2023-10-24 NOTE — Plan of Care (Signed)
  Problem: Health Behavior/Discharge Planning: Goal: Ability to manage health-related needs will improve Outcome: Progressing   Problem: Clinical Measurements: Goal: Ability to maintain clinical measurements within normal limits will improve Outcome: Not Met (add Reason) Goal: Diagnostic test results will improve Outcome: Not Met (add Reason)   Problem: Pain Management: Goal: General experience of comfort will improve Outcome: Progressing
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
## 2023-11-27 ENCOUNTER — Ambulatory Visit: Payer: Medicare Other | Admitting: Gastroenterology

## 2023-11-27 ENCOUNTER — Encounter: Payer: Self-pay | Admitting: Gastroenterology

## 2023-11-27 VITALS — BP 132/88 | HR 99 | Temp 98.1°F | Ht 67.0 in | Wt 129.0 lb

## 2023-11-27 DIAGNOSIS — K219 Gastro-esophageal reflux disease without esophagitis: Secondary | ICD-10-CM | POA: Diagnosis not present

## 2023-11-27 DIAGNOSIS — R112 Nausea with vomiting, unspecified: Secondary | ICD-10-CM | POA: Diagnosis not present

## 2023-11-27 DIAGNOSIS — R634 Abnormal weight loss: Secondary | ICD-10-CM

## 2023-11-27 DIAGNOSIS — R1115 Cyclical vomiting syndrome unrelated to migraine: Secondary | ICD-10-CM

## 2023-11-27 DIAGNOSIS — R1314 Dysphagia, pharyngoesophageal phase: Secondary | ICD-10-CM

## 2023-11-27 DIAGNOSIS — R1310 Dysphagia, unspecified: Secondary | ICD-10-CM | POA: Diagnosis not present

## 2023-11-27 DIAGNOSIS — K760 Fatty (change of) liver, not elsewhere classified: Secondary | ICD-10-CM

## 2023-11-27 DIAGNOSIS — K529 Noninfective gastroenteritis and colitis, unspecified: Secondary | ICD-10-CM

## 2023-11-27 MED ORDER — PROMETHAZINE HCL 25 MG PO TABS: 12.5000 mg | ORAL_TABLET | ORAL | 1 refills | Status: DC | PRN

## 2023-11-27 NOTE — Patient Instructions (Addendum)
We will get you scheduled for an upper endoscopy with possible dilation with Dr. Jena Gauss in the near future.  For now continue pantoprazole 40 mg once daily and using Zofran and Phenergan as needed.  Follow a low-fat/heart healthy diet.  Continue to avoid any ibuprofen, Aleve, Advil, meloxicam, BC or Goody powders.  Continue your daily protein shakes to help avoid further weight loss.  We can consider checking your CRP as well as surveillance liver labs.  Your most recent liver enzymes were normal but we should do some screening for fibrosis given prior imaging evidence of fatty liver which we have seen in the past and on most recent imaging you had some improvement which could be from your recent weight loss and taking Repatha.  Follow-up in 3 months.  I hope you have a [name] and a happy new year!  It was a pleasure to see you today. I want to create trusting relationships with patients. If you receive a survey regarding your visit,  I greatly appreciate you taking time to fill this out on paper or through your MyChart. I value your feedback.  Brooke Bonito, MSN, FNP-BC, AGACNP-BC [facility]

## 2023-11-28 ENCOUNTER — Encounter: Payer: Self-pay | Admitting: *Deleted

## 2023-12-28 ENCOUNTER — Encounter (HOSPITAL_COMMUNITY): Payer: Self-pay | Admitting: *Deleted

## 2023-12-28 NOTE — Patient Instructions (Signed)
Upper Endoscopy, Adult Upper endoscopy is a procedure to look inside the upper GI (gastrointestinal) tract. The upper GI tract is made up of: The esophagus. This is the part of the body that moves food from your mouth to your stomach. The stomach. The duodenum. This is the first part of your small intestine. This procedure is also called esophagogastroduodenoscopy (EGD) or gastroscopy. In this procedure, your health care provider passes a thin, flexible tube (endoscope) through your mouth and down your esophagus into your stomach and into your duodenum. A small camera is attached to the end of the tube. Images from the camera appear on a monitor in the exam room. During this procedure, your health care provider may also remove a small piece of tissue to be sent to a lab and examined under a microscope (biopsy). Your health care provider may do an upper endoscopy to diagnose cancers of the upper GI tract. You may also have this procedure to find the cause of other conditions, such as: Stomach pain. Heartburn. Pain or problems when swallowing. Nausea and vomiting. Stomach bleeding. Stomach ulcers. Tell a health care provider about: Any allergies you have. All medicines you are taking, including vitamins, herbs, eye drops, creams, and over-the-counter medicines. Any problems you or family members have had with anesthetic medicines. Any bleeding problems you have. Any surgeries you have had. Any medical conditions you have. Whether you are pregnant or may be pregnant. What are the risks? Your healthcare provider will talk with you about risks. These may include: Infection. Bleeding. Allergic reactions to medicines. A tear or hole (perforation) in the esophagus, stomach, or duodenum. What happens before the procedure? When to stop eating and drinking Follow instructions from your health care provider about what you may eat and drink. These may include: 8 hours before your procedure Stop  eating most foods. Do not eat meat, fried foods, or fatty foods. Eat only light foods, such as toast or crackers. All liquids are okay except energy drinks and alcohol. 6 hours before your procedure Stop eating. Drink only clear liquids, such as water, clear fruit juice, black coffee, plain tea, and sports drinks. Do not drink energy drinks or alcohol. 2 hours before your procedure Stop drinking all liquids. You may be allowed to take medicines with small sips of water. If you do not follow your health care provider's instructions, your procedure may be delayed or canceled. Medicines Ask your health care provider about: Changing or stopping your regular medicines. This is especially important if you are taking diabetes medicines or blood thinners. Taking medicines such as aspirin and ibuprofen. These medicines can thin your blood. Do not take these medicines unless your health care provider tells you to take them. Taking over-the-counter medicines, vitamins, herbs, and supplements. General instructions If you will be going home right after the procedure, plan to have a responsible adult:   Melanie Shepard  12/28/2023     @PREFPERIOPPHARMACY @   Your procedure is scheduled on Wednesday, 1/22.  Report to [facility] at 1100 A.M.  Call this number if you have problems the morning of surgery:  [phone]  If you experience any cold or flu symptoms such as cough, fever, chills, shortness of breath, etc. between now and your scheduled surgery, please notify us at the above number.   Remember:  Do not eat or drink after midnight.  You may drink clear liquids until 0900 .  Clear liquids allowed are:  Water, Juice (No red color; non-citric and without pulp; diabetics please choose diet or no sugar options), Carbonated beverages (diabetics please choose diet or no sugar options), Clear Tea (No creamer, milk, or cream, including half & half and powdered creamer), Black Coffee Only  (No creamer, milk or cream, including half & half and powdered creamer), Plain Jell-O Only (No red color; diabetics please choose no sugar options), Clear Sports drink (No red color; diabetics please choose diet or no sugar options), and Plain Popsicles Only (No red color; diabetics please choose no sugar options)    Take these medicines the morning of surgery with A SIP OF WATER symbicort, protonix, synthroid, and xanax or clonazepam if anxious, zofran, oxycodone and zanaflex if needed.    Do not wear jewelry, make-up or nail polish, including gel polish,  artificial nails, or any other type of covering on natural nails (fingers and  toes).  Do not wear lotions, powders, or perfumes, or deodorant.  Do not shave 48 hours prior to surgery.  Men may shave face and neck.  Do not bring valuables to the hospital.  [facility] is not responsible for any belongings or valuables.  Contacts, dentures or bridgework may not be worn into surgery.  Leave your suitcase in the car.  After surgery it may be brought to your room.  For patients admitted to the hospital, discharge time will be determined by your treatment team.  Patients discharged the day of surgery will not be allowed to drive home.   Name and phone number of your driver:   responsible driver Special instructions:  Follow instructions given to you by Dr. Luvenia Starch office.  Please read over the following fact sheets that you were given. Anesthesia Post-op Instructions       Take you home from the hospital or clinic. You will not be allowed to drive. Care for you for the time you are told. What happens during the procedure?  An IV will be inserted into one of your veins. You may be given one or more of the following: A medicine to help you relax (sedative). A medicine to numb the throat (local anesthetic). You will lie on your left side on an exam table. Your health care provider will pass the endoscope through your mouth and down  your esophagus. Your health care provider will use the scope to check the inside of your esophagus, stomach, and duodenum. Biopsies may be taken. The endoscope will be removed. The procedure may vary among health care providers and hospitals. What happens after the procedure? Your blood pressure, heart rate, breathing rate, and blood oxygen level will be monitored until you leave the hospital or clinic. When your throat is no longer numb, you may be given some fluids to drink. If you were given a sedative during the procedure, it can affect you for several hours. Do not drive or operate machinery until your health care provider says that it is safe. It is up to you to get the results of your procedure. Ask your health care provider, or the department that is doing the procedure, when your results will be ready. Contact a health care provider if you: Have a sore throat that lasts longer than 1 day. Have a fever. Get help right away if you: Vomit blood or your vomit looks like coffee grounds. Have bloody, black, or tarry stools. Have a very bad sore throat or you cannot swallow. Have difficulty breathing or very bad pain in your chest or  abdomen. These symptoms may be an emergency. Get help right away. Call 911. Do not wait to see if the symptoms will go away. Do not drive yourself to the hospital. Summary Upper endoscopy is a procedure to look inside the upper GI tract. During the procedure, an IV will be inserted into one of your veins. You may be given a medicine to help you relax. The endoscope will be passed through your mouth and down your esophagus. Follow instructions from your health care provider about what you can eat and drink. This information is not intended to replace advice given to you by your health care provider. Make sure you discuss any questions you have with your health care provider. Document Revised: 03/08/2022 Document Reviewed: 03/08/2022 Elsevier Patient Education   2024 Elsevier Inc.Upper Endoscopy, Adult, Care After After the procedure, it is common to have a sore throat. It is also common to have: Mild stomach pain or discomfort. Bloating. Nausea. Follow these instructions at home: The instructions below may help you care for yourself at home. Your health care provider may give you more instructions. If you have questions, ask your health care provider. If you were given a sedative during the procedure, it can affect you for several hours. Do not drive or operate machinery until your health care provider says that it is safe. If you will be going home right after the procedure, plan to have a responsible adult: Take you home from the hospital or clinic. You will not be allowed to drive. Care for you for the time you are told. Follow instructions from your health care provider about what you may eat and drink. Return to your normal activities as told by your health care provider. Ask your health care provider what activities are safe for you. Take over-the-counter and prescription medicines only as told by your health care provider. Contact a health care provider if you: Have a sore throat that lasts longer than one day. Have trouble swallowing. Have a fever. Get help right away if you: Vomit blood or your vomit looks like coffee grounds. Have bloody, black, or tarry stools. Have a very bad sore throat or you cannot swallow. Have difficulty breathing or very bad pain in your chest or abdomen. These symptoms may be an emergency. Get help right away. Call 911. Do not wait to see if the symptoms will go away. Do not drive yourself to the hospital. Summary After the procedure, it is common to have a sore throat, mild stomach discomfort, bloating, and nausea. If you were given a sedative during the procedure, it can affect you for several hours. Do not drive until your health care provider says that it is safe. Follow instructions from your health  care provider about what you may eat and drink. Return to your normal activities as told by your health care provider. This information is not intended to replace advice given to you by your health care provider. Make sure you discuss any questions you have with your health care provider. Document Revised: 03/08/2022 Document Reviewed: 03/08/2022 Elsevier Patient Education  2024 [employer].

## 2024-01-01 ENCOUNTER — Encounter (HOSPITAL_COMMUNITY)
Admission: RE | Admit: 2024-01-01 | Discharge: 2024-01-01 | Disposition: A | Discharge status: Home / Self Care | Payer: Medicare Other | Source: Ambulatory Visit | Attending: Internal Medicine | Admitting: Internal Medicine

## 2024-01-01 ENCOUNTER — Encounter (HOSPITAL_COMMUNITY): Payer: Self-pay

## 2024-01-01 VITALS — Ht 67.0 in | Wt 130.0 lb

## 2024-01-01 DIAGNOSIS — K769 Liver disease, unspecified: Secondary | ICD-10-CM

## 2024-01-01 DIAGNOSIS — E876 Hypokalemia: Secondary | ICD-10-CM

## 2024-01-01 DIAGNOSIS — K760 Fatty (change of) liver, not elsewhere classified: Secondary | ICD-10-CM

## 2024-01-01 DIAGNOSIS — K759 Inflammatory liver disease, unspecified: Secondary | ICD-10-CM

## 2024-01-01 DIAGNOSIS — Z01818 Encounter for other preprocedural examination: Secondary | ICD-10-CM

## 2024-01-02 ENCOUNTER — Ambulatory Visit (HOSPITAL_COMMUNITY): Payer: Medicare Other | Admitting: Anesthesiology

## 2024-01-02 ENCOUNTER — Ambulatory Visit (HOSPITAL_BASED_OUTPATIENT_CLINIC_OR_DEPARTMENT_OTHER): Payer: Medicare Other | Admitting: Anesthesiology

## 2024-01-02 ENCOUNTER — Encounter (HOSPITAL_COMMUNITY)
Admission: RE | Disposition: A | Discharge status: Home / Self Care | Payer: Self-pay | Source: Home / Self Care | Attending: Internal Medicine

## 2024-01-02 ENCOUNTER — Encounter (HOSPITAL_COMMUNITY): Payer: Self-pay | Admitting: Internal Medicine

## 2024-01-02 ENCOUNTER — Ambulatory Visit (HOSPITAL_COMMUNITY)
Admission: RE | Admit: 2024-01-02 | Discharge: 2024-01-02 | Disposition: A | Discharge status: Home / Self Care | Payer: Medicare Other | Attending: Internal Medicine | Admitting: Internal Medicine

## 2024-01-02 DIAGNOSIS — E063 Autoimmune thyroiditis: Secondary | ICD-10-CM | POA: Insufficient documentation

## 2024-01-02 DIAGNOSIS — E119 Type 2 diabetes mellitus without complications: Secondary | ICD-10-CM | POA: Diagnosis not present

## 2024-01-02 DIAGNOSIS — F17210 Nicotine dependence, cigarettes, uncomplicated: Secondary | ICD-10-CM | POA: Diagnosis not present

## 2024-01-02 DIAGNOSIS — J4489 Other specified chronic obstructive pulmonary disease: Secondary | ICD-10-CM | POA: Insufficient documentation

## 2024-01-02 DIAGNOSIS — K769 Liver disease, unspecified: Secondary | ICD-10-CM

## 2024-01-02 DIAGNOSIS — Z01818 Encounter for other preprocedural examination: Secondary | ICD-10-CM

## 2024-01-02 DIAGNOSIS — K219 Gastro-esophageal reflux disease without esophagitis: Secondary | ICD-10-CM | POA: Diagnosis not present

## 2024-01-02 DIAGNOSIS — K3189 Other diseases of stomach and duodenum: Secondary | ICD-10-CM

## 2024-01-02 DIAGNOSIS — I10 Essential (primary) hypertension: Secondary | ICD-10-CM | POA: Insufficient documentation

## 2024-01-02 DIAGNOSIS — E876 Hypokalemia: Secondary | ICD-10-CM

## 2024-01-02 DIAGNOSIS — K766 Portal hypertension: Secondary | ICD-10-CM | POA: Insufficient documentation

## 2024-01-02 DIAGNOSIS — K759 Inflammatory liver disease, unspecified: Secondary | ICD-10-CM

## 2024-01-02 DIAGNOSIS — I2510 Atherosclerotic heart disease of native coronary artery without angina pectoris: Secondary | ICD-10-CM | POA: Insufficient documentation

## 2024-01-02 DIAGNOSIS — R1310 Dysphagia, unspecified: Secondary | ICD-10-CM | POA: Diagnosis present

## 2024-01-02 DIAGNOSIS — R112 Nausea with vomiting, unspecified: Secondary | ICD-10-CM | POA: Diagnosis present

## 2024-01-02 DIAGNOSIS — K760 Fatty (change of) liver, not elsewhere classified: Secondary | ICD-10-CM

## 2024-01-02 DIAGNOSIS — Z8249 Family history of ischemic heart disease and other diseases of the circulatory system: Secondary | ICD-10-CM | POA: Insufficient documentation

## 2024-01-02 DIAGNOSIS — F418 Other specified anxiety disorders: Secondary | ICD-10-CM | POA: Diagnosis not present

## 2024-01-02 HISTORY — PX: BIOPSY: SHX5522

## 2024-01-02 HISTORY — PX: MALONEY DILATION: SHX5535

## 2024-01-02 HISTORY — PX: ESOPHAGOGASTRODUODENOSCOPY (EGD) WITH PROPOFOL: SHX5813

## 2024-01-02 LAB — PROTIME-INR
INR: 1 (ref 0.8–1.2)
Prothrombin Time: 13 s (ref 11.4–15.2)

## 2024-01-02 LAB — BASIC METABOLIC PANEL
Anion gap: 13 (ref 5–15)
BUN: 12 mg/dL (ref 6–20)
CO2: 23 mmol/L (ref 22–32)
Calcium: 10 mg/dL (ref 8.9–10.3)
Chloride: 100 mmol/L (ref 98–111)
Creatinine, Ser: 0.86 mg/dL (ref 0.44–1.00)
GFR, Estimated: >60 mL/min (ref >60)
Glucose, Bld: 149 mg/dL — ABNORMAL HIGH (ref 70–99)
Potassium: 4.2 mmol/L (ref 3.5–5.1)
Sodium: 136 mmol/L (ref 135–145)

## 2024-01-02 LAB — RAPID URINE DRUG SCREEN, HOSP PERFORMED
Amphetamines: NOT DETECTED
Barbiturates: NOT DETECTED
Benzodiazepines: POSITIVE — AB
Cocaine: NOT DETECTED
Opiates: POSITIVE — AB
Tetrahydrocannabinol: NOT DETECTED

## 2024-01-02 SURGERY — ESOPHAGOGASTRODUODENOSCOPY (EGD) WITH PROPOFOL
Anesthesia: General

## 2024-01-02 MED ORDER — PROPOFOL 10 MG/ML IV BOLUS
INTRAVENOUS | Status: DC | PRN
  Administered 2024-01-02: 50 mg via INTRAVENOUS
  Administered 2024-01-02: 100 mg via INTRAVENOUS

## 2024-01-02 MED ORDER — DEXMEDETOMIDINE HCL IN NACL 80 MCG/20ML IV SOLN
INTRAVENOUS | Status: DC | PRN
  Administered 2024-01-02: 4 ug via INTRAVENOUS
  Administered 2024-01-02 (×2): 8 ug via INTRAVENOUS

## 2024-01-02 MED ORDER — LACTATED RINGERS IV SOLN: INTRAVENOUS | Status: DC

## 2024-01-02 MED ORDER — LIDOCAINE HCL (PF) 2 % IJ SOLN
INTRAMUSCULAR | Status: DC | PRN
  Administered 2024-01-02: 100 mg via INTRADERMAL

## 2024-01-02 MED ORDER — PROPOFOL 500 MG/50ML IV EMUL
INTRAVENOUS | Status: DC | PRN
  Administered 2024-01-02: 200 ug/kg/min via INTRAVENOUS

## 2024-01-02 NOTE — Op Note (Signed)
[facility] Patient Name: Melanie Shepard Procedure Date: 01/02/2024 11:35 AM MRN: [id number] Date of Birth: 1966/10/19 Attending MD: Gennette Pac , MD, [id number] CSN: [id number] Age: 58 Admit Type: Outpatient Procedure:                Upper GI endoscopy Indications:              Dysphagia; nausea and vomiting Providers:                Gennette Pac, MD, Nena Polio, RN, Elinor Parkinson Referring MD:              Medicines:                Propofol per Anesthesia Complications:            No immediate complications. No immediate                            complications. Estimated blood loss: None. Estimated Blood Loss:     Estimated blood loss was minimal. Procedure:                Pre-Anesthesia Assessment:                           - Prior to the procedure, a History and Physical                            was performed, and patient medications and                            allergies were reviewed. The patient's tolerance of                            previous anesthesia was also reviewed. The risks                            and benefits of the procedure and the sedation                            options and risks were discussed with the patient.                            All questions were answered, and informed consent                            was obtained. Prior Anticoagulants: The patient has                            taken no anticoagulant or antiplatelet agents. ASA                            Grade Assessment: III - A patient with severe  systemic disease. After reviewing the risks and                            benefits, the patient was deemed in satisfactory                            condition to undergo the procedure.                           After obtaining informed consent, the endoscope was                            passed under direct vision. Throughout the                             procedure, the patient's blood pressure, pulse, and                            oxygen saturations were monitored continuously. The                            GIF-H190 ([id number]) scope was introduced through the                            mouth, and advanced to the second part of duodenum.                            The upper GI endoscopy was accomplished without                            difficulty. The patient tolerated the procedure                            well. Scope In: 12:23:11 PM Scope Out: 12:30:03 PM Total Procedure Duration: 0 hours 6 minutes 52 seconds  Findings:      The examined esophagus was normal. The scope was withdrawn. Dilation was       performed with a Maloney dilator with mild resistance at 54 Fr. The       dilation site was examined following endoscope reinsertion and showed no       change. Estimated blood loss was minimal. Finally , biopsies abnormal       gastric mucosa taken.      Moderate portal hypertensive gastropathy was found in the entire       examined stomach.      The duodenal bulb and second portion of the duodenum were normal. Impression:               - Normal esophagus. Dilated.                           - Portal hypertensive gastropathy. Status post                            gastric biopsy                           -  Normal duodenal bulb and second portion of the                            duodenum. Moderate Sedation:      Moderate (conscious) sedation was personally administered by an       anesthesia professional. The following parameters were monitored: oxygen       saturation, heart rate, blood pressure, respiratory rate, EKG, adequacy       of pulmonary ventilation, and response to care. Recommendation:           - Patient has a contact number available for                            emergencies. The signs and symptoms of potential                            delayed complications were discussed with the                             patient. Return to normal activities tomorrow.                            Written discharge instructions were provided to the                            patient.                           - Advance diet as tolerated.                           - Continue present medications.                           - Return to my office in 7 weeks. Follow-up on                            pathology. Procedure Code(s):        --- Professional ---                           [phone], Esophagogastroduodenoscopy, flexible,                            transoral; diagnostic, including collection of                            specimen(s) by brushing or washing, when performed                            (separate procedure)                           43450, Dilation of esophagus, by unguided sound or                            bougie, single or  multiple passes Diagnosis Code(s):        --- Professional ---                           K76.6, Portal hypertension                           K31.89, Other diseases of stomach and duodenum                           R13.10, Dysphagia, unspecified CPT copyright 2022 American Medical Association. All rights reserved. The codes documented in this report are preliminary and upon coder review may  be revised to meet current compliance requirements. Gerrit Friends. Nicola Heinemann, MD Gennette Pac, MD 01/02/2024 12:41:45 PM This report has been signed electronically. Number of Addenda: 0

## 2024-01-02 NOTE — Anesthesia Procedure Notes (Addendum)
Date/Time: 01/02/2024 12:15 PM  Performed by: Julian Reil, CRNAPre-anesthesia Checklist: Patient identified, Emergency Drugs available, Suction available and Patient being monitored Patient Re-evaluated:Patient Re-evaluated prior to induction Oxygen Delivery Method: Nasal cannula Induction Type: IV induction Placement Confirmation: positive ETCO2 Comments: Optiflow High Flow [place] O2 used.

## 2024-01-02 NOTE — Anesthesia Preprocedure Evaluation (Addendum)
Anesthesia Evaluation  Patient identified by MRN, date of birth, ID band Patient awake    Reviewed: Allergy & Precautions, H&P , NPO status , Patient's Chart, lab work & pertinent test results, reviewed documented beta blocker date and time   Airway Mallampati: II  TM Distance: >3 FB Neck ROM: full    Dental  (+) Dental Advisory Given, Implants   Pulmonary shortness of breath, asthma , COPD, Current Smoker and Patient abstained from smoking.   Pulmonary exam normal breath sounds clear to auscultation       Cardiovascular Exercise Tolerance: Good hypertension, + CAD  negative cardio ROS  Rhythm:regular Rate:Normal     Neuro/Psych  Headaches, Seizures -,  PSYCHIATRIC DISORDERS Anxiety Depression    negative neurological ROS  negative psych ROS   GI/Hepatic negative GI ROS, Neg liver ROS,GERD  ,,(+) Hepatitis -  Endo/Other  negative endocrine ROSdiabetesHypothyroidism    Renal/GU negative Renal ROS  negative genitourinary   Musculoskeletal   Abdominal   Peds  Hematology negative hematology ROS (+) Blood dyscrasia, anemia   Anesthesia Other Findings   Reproductive/Obstetrics negative OB ROS                             Anesthesia Physical Anesthesia Plan  ASA: 2  Anesthesia Plan: General   Post-op Pain Management:    Induction:   PONV Risk Score and Plan: Propofol infusion  Airway Management Planned:   Additional Equipment:   Intra-op Plan:   Post-operative Plan:   Informed Consent: I have reviewed the patients History and Physical, chart, labs and discussed the procedure including the risks, benefits and alternatives for the proposed anesthesia with the patient or authorized representative who has indicated his/her understanding and acceptance.     Dental Advisory Given  Plan Discussed with: CRNA  Anesthesia Plan Comments:        Anesthesia Quick Evaluation

## 2024-01-02 NOTE — Discharge Instructions (Addendum)
EGD Discharge instructions Please read the instructions outlined below and refer to this sheet in the next few weeks. These discharge instructions provide you with general information on caring for yourself after you leave the hospital. Your doctor may also give you specific instructions. While your treatment has been planned according to the most current medical practices available, unavoidable complications occasionally occur. If you have any problems or questions after discharge, please call your doctor. ACTIVITY You may resume your regular activity but move at a slower pace for the next 24 hours.  Take frequent rest periods for the next 24 hours.  Walking will help expel (get rid of) the air and reduce the bloated feeling in your abdomen.  No driving for 24 hours (because of the anesthesia (medicine) used during the test).  You may shower.  Do not sign any important legal documents or operate any machinery for 24 hours (because of the anesthesia used during the test).  NUTRITION Drink plenty of fluids.  You may resume your normal diet.  Begin with a light meal and progress to your normal diet.  Avoid alcoholic beverages for 24 hours or as instructed by your caregiver.  MEDICATIONS You may resume your normal medications unless your caregiver tells you otherwise.  WHAT YOU CAN EXPECT TODAY You may experience abdominal discomfort such as a feeling of fullness or "gas" pains.  FOLLOW-UP Your doctor will discuss the results of your test with you.  SEEK IMMEDIATE MEDICAL ATTENTION IF ANY OF THE FOLLOWING OCCUR: Excessive nausea (feeling sick to your stomach) and/or vomiting.  Severe abdominal pain and distention (swelling).  Trouble swallowing.  Temperature over 101 F (37.8 C).  Rectal bleeding or vomiting of blood.     Your esophagus appeared normal today.  It was dilated.   Stomach appeared abnormal.  Biopsies taken.   Further recommendations to follow pending review of pathology  report   follow-up appointment with [place] as scheduled   at patient request, I called Raiford Simmonds at [phone] findings and recommendations
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
## 2024-01-02 NOTE — Transfer of Care (Signed)
Immediate Anesthesia Transfer of Care Note  Patient: Melanie Shepard  Procedure(s) Performed: ESOPHAGOGASTRODUODENOSCOPY (EGD) WITH PROPOFOL MALONEY DILATION BIOPSY  Patient Location: Short Stay  Anesthesia Type:General  Level of Consciousness: drowsy  Airway & Oxygen Therapy: Patient Spontanous Breathing  Post-op Assessment: Report given to RN and Post -op Vital signs reviewed and stable  Post vital signs: Reviewed and stable  Last Vitals:  Vitals Value Taken Time  BP 96/63 01/02/24 1258  Temp 36.6 C 01/02/24 1237  Pulse 83 01/02/24 1258  Resp 22 01/02/24 1237  SpO2 96 % 01/02/24 1258    Last Pain:  Vitals:   01/02/24 1251  TempSrc:   PainSc: Asleep      Patients Stated Pain Goal: 6 (01/02/24 1115)  Complications: No notable events documented.

## 2024-01-03 ENCOUNTER — Encounter (HOSPITAL_COMMUNITY): Payer: Self-pay | Admitting: Internal Medicine

## 2024-01-03 LAB — SURGICAL PATHOLOGY

## 2024-01-07 ENCOUNTER — Encounter: Payer: Self-pay | Admitting: Internal Medicine

## 2024-01-11 NOTE — Anesthesia Postprocedure Evaluation (Signed)
Anesthesia Post Note  Patient: Ailani Escajeda  Procedure(s) Performed: ESOPHAGOGASTRODUODENOSCOPY (EGD) WITH PROPOFOL MALONEY DILATION BIOPSY  Patient location during evaluation: Phase II Anesthesia Type: General Level of consciousness: awake Pain management: pain level controlled Vital Signs Assessment: post-procedure vital signs reviewed and stable Respiratory status: spontaneous breathing and respiratory function stable Cardiovascular status: blood pressure returned to baseline and stable Postop Assessment: no headache and no apparent nausea or vomiting Anesthetic complications: no Comments: Late entry   No notable events documented.   Last Vitals:  Vitals:   01/02/24 1251 01/02/24 1258  BP: (!) 78/61 96/63  Pulse:  83  Resp:    Temp:    SpO2:  96%    Last Pain:  Vitals:   01/02/24 1251  TempSrc:   PainSc: Asleep                 Windell Norfolk

## 2024-02-19 ENCOUNTER — Encounter: Payer: Self-pay | Admitting: Gastroenterology

## 2024-02-19 ENCOUNTER — Other Ambulatory Visit: Payer: Self-pay | Admitting: *Deleted

## 2024-02-19 ENCOUNTER — Ambulatory Visit: Payer: Medicare Other | Admitting: Gastroenterology

## 2024-02-19 VITALS — BP 94/61 | HR 102 | Temp 98.1°F | Ht 67.0 in | Wt 133.4 lb

## 2024-02-19 DIAGNOSIS — Z8719 Personal history of other diseases of the digestive system: Secondary | ICD-10-CM

## 2024-02-19 DIAGNOSIS — K3189 Other diseases of stomach and duodenum: Secondary | ICD-10-CM

## 2024-02-19 DIAGNOSIS — R634 Abnormal weight loss: Secondary | ICD-10-CM

## 2024-02-19 DIAGNOSIS — R1115 Cyclical vomiting syndrome unrelated to migraine: Secondary | ICD-10-CM

## 2024-02-19 DIAGNOSIS — K219 Gastro-esophageal reflux disease without esophagitis: Secondary | ICD-10-CM | POA: Diagnosis not present

## 2024-02-19 DIAGNOSIS — R1314 Dysphagia, pharyngoesophageal phase: Secondary | ICD-10-CM

## 2024-02-19 DIAGNOSIS — K760 Fatty (change of) liver, not elsewhere classified: Secondary | ICD-10-CM

## 2024-02-19 DIAGNOSIS — F411 Generalized anxiety disorder: Secondary | ICD-10-CM

## 2024-02-19 DIAGNOSIS — R162 Hepatomegaly with splenomegaly, not elsewhere classified: Secondary | ICD-10-CM

## 2024-02-19 DIAGNOSIS — R110 Nausea: Secondary | ICD-10-CM

## 2024-02-19 MED ORDER — ONDANSETRON 8 MG PO TBDP: 8.0000 mg | ORAL_TABLET | Freq: Three times a day (TID) | ORAL | 2 refills | Status: DC | PRN

## 2024-02-19 MED ORDER — AMITRIPTYLINE HCL 10 MG PO TABS: 10.0000 mg | ORAL_TABLET | Freq: Every day | ORAL | 1 refills | Status: DC

## 2024-02-19 MED ORDER — PROMETHAZINE HCL 25 MG PO TABS: 12.5000 mg | ORAL_TABLET | ORAL | 1 refills | Status: DC | PRN
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
## 2024-02-19 NOTE — Patient Instructions (Addendum)
 We will send neurology referral for you.   We will start amitriptyline 10 mg nightly and increase if tolerated.  Please let me know how you are doing in 2-3 weeks.  Monitor for side effects of amitriptyline: Dry mouth Constipation Blurred vision Drowsiness Dizziness Orthostatic hypotension (low blood pressure when standing up) Urinary retention  May continue to use Zofran and Phenergan as needed.  Please have blood work completed at [facility].  We will call you with results once they have been received. Please allow 3-5 business days for review. 2 locations for [facility] in [place]:              1. [address], [place]              2. [address] Melanie Shepard   Continue your pantoprazole 40 mg twice daily.  Continue good protein intake  There will be information below regarding Mediterranean diet, this is good for liver health.  Follow-up in 8-10 weeks.  It was a pleasure to see you today. I want to create trusting relationships with patients. If you receive a survey regarding your visit,  I greatly appreciate you taking time to fill this out on paper or through your MyChart. I value your feedback.  Brooke Bonito, MSN, FNP-BC, AGACNP-BC [facility]    What is the Mediterranean Diet? The Mediterranean Diet is a way of eating that emphasizes plant-based foods and healthy fats. You focus on overall eating patterns rather than following strict formulas or calculations.  In general, you'll eat: Lots of vegetables, fruit, beans, lentils and nuts. A good amount of whole grains, like whole-wheat bread and brown rice. Plenty of extra virgin olive oil (EVOO) as a source of healthy fat. A good amount of fish, especially fish rich in omega-3 fatty acids. A moderate amount of natural cheese and yogurt. Little or no red meat, choosing poultry, fish or beans instead of red meat. Little or no sweets, sugary drinks or butter. A moderate  amount of wine with meals (but if you don't already drink, don't start). This is how people ate in certain Mediterranean countries in the mid-20th century. Researchers have linked these eating patterns with a reduced risk of coronary artery disease (CAD). Today, healthcare providers recommend this eating plan if you have risk factors for heart disease or to support other aspects of your health.  A dietitian can help you modify your approach as needed based on your medical history, underlying conditions, allergies and preferences.    What are the benefits of the Mediterranean Diet? The mediterranean diet allows you to focus on overall eating patterns rather than following strict formulas or calculations.  The Mediterranean Diet has many benefits, including: Lowering your risk of cardiovascular disease, including a heart attack or stroke. Supporting a body weight that's healthy for you. Supporting healthy blood sugar levels, blood pressure and cholesterol. Lowering your risk of metabolic syndrome. Supporting a healthy balance of gut microbiota (bacteria and other microorganisms) in your digestive system. Lowering your risk for certain types of cancer. Slowing the decline of brain function as you age. Helping you live longer.  The Mediterranean Diet has these benefits because it: Limits saturated fat and trans fat. You need some saturated fat, but only in small amounts. Eating too much saturated fat can raise your LDL (bad) cholesterol. A high LDL raises your risk of plaque buildup in your arteries (atherosclerosis). Trans fat has no health benefits. Both of these "unhealthy fats"  can cause inflammation. Encourages healthy unsaturated fats, including omega-3 fatty acids. Unsaturated fats promote healthy cholesterol levels, support brain health and combat inflammation. Plus, a diet high in unsaturated fats and low in saturated fat promotes healthy blood sugar levels. Limits sodium. Eating foods  high in sodium can raise your blood pressure, putting you at a greater risk for a heart attack or stroke. Limits refined carbohydrates, including sugar. Foods high in refined carbs can cause your blood sugar to spike. Refined carbs also give you excess calories without much nutritional benefit. For example, such foods often have little or no fiber. Favors foods high in fiber and antioxidants. These nutrients help reduce inflammation throughout your body. Fiber also helps keep waste moving through your large intestine and helps maintain healthy blood sugar levels. Antioxidants protect you against cancer by warding off free radicals. The Mediterranean Diet includes many different nutrients that work together to help your body. There's no single food or ingredient responsible for the Mediterranean Diet's benefits. Instead, the diet is healthy for you because of the combination of nutrients it provides.  Think of a choir with many people singing. One voice alone might carry part of the tune, but you need all the voices to come together to achieve the full effect. Similarly, the Mediterranean Diet works by giving you an ideal blend of nutrients that harmonize to support your health.  Mediterranean Diet food list The Mediterranean Diet encourages you to eat plenty of some foods (like whole grains and vegetables) while limiting others. If you're planning a grocery store trip, you might wonder which foods to buy. Here are some examples of foods to eat often with the Mediterranean Diet.  A Mediterranean Diet food list includes a mix of veggies, tubers, fruits, grains, nuts, seeds and legumes.    Mediterranean Diet serving goals and sizes A fridge and pantry full of nutritious foods are great for starters. But where do you go from there? How much of each food do you need? It's always best to talk to a dietitian to get advice tailored to your needs as you get started. The chart below offers some general guidance  on serving goals and serving sizes, according to the type of food.    How do I create a Mediterranean Diet meal plan? It's important to consult with a primary care physician (PCP) or dietitian before making drastic changes to your diet or trying any new eating plan. They'll make sure your intended plan is best for you based on your individual needs. They may also share meal plans and recipes for you to try at home.  In general, when thinking about meals, you'll want to collect some go-to options and recipes for breakfasts, lunches, dinners and snacks. The more variety, the better. You don't want to get stuck in a rut or feel like you're restricted in which foods you can or should eat. Luckily, there's plenty of room for changing things up with the Mediterranean Diet.    What foods are not allowed on the Mediterranean Diet? The Mediterranean Diet doesn't set hard and fast rules for what you're allowed or not allowed. Rather, it encourages you to eat more of certain foods and limit others. Here's what you should try to limit as much as possible: Any foods with added sugar, like bakery goods, ice cream and even some granola bars. Any drinks with added sugar, including fruit juices and sodas. Beer and liquor. Foods high in sodium or saturated fat. Refined carbohydrates, like white  bread and white rice. Highly processed foods, like some cheeses. Fatty or processed meats. Additional Common Questions What is the Mediterranean Diet pyramid? The Mediterranean Diet pyramid is one way to visualize what foods you should eat and how often. Different organizations have created slightly different versions of this pyramid. All Mediterranean Diet pyramids encourage you to eat mostly veggies, fruits, whole grains and extra virgin olive oil while limiting red meat and sweets.  How does lifestyle relate to the Mediterranean Diet?  To get the most from your eating plan, try to: Exercise regularly, ideally with  others. Avoid smoking or using any tobacco products. Prepare and enjoy meals with family and friends. Adriana Simas more often than you eat out. Eat locally sourced foods whenever possible.  Can the Mediterranean Diet be vegetarian? Yes. If you prefer a vegetarian diet, you can easily modify the Mediterranean Diet to exclude meat and fish. In that case, you'd gain your protein solely from plant sources like nuts and beans. Talk to a dietitian to learn more.  Can the Mediterranean Diet be gluten-free? Yes. You can modify recipes to exclude gluten-based products. Talk to a dietitian for recipe ideas and support in making necessary changes.  Can I use regular olive oil instead of extra virgin olive oil? Regular olive oil is a good alternative to an oil that's high in saturated fat (like palm oil). However, to get the most benefits, opt for extra virgin olive oil.  A crucial fact to know before starting the Mediterranean Diet is that not all olive oils are the same. The Mediterranean Diet calls for extra virgin olive oil (EVOO), specifically. That's because it has a healthy fat ratio. This means EVOO contains more healthy fat (unsaturated) than unhealthy fat (saturated). Aside from its fat ratio, EVOO is healthy because it's high in antioxidants.  Antioxidants help protect your cells from damage, therefore protecting your heart and brain and reducing inflammation throughout your body. Because it's manufactured differently, regular olive oil doesn't contain as many of these antioxidants.  A note from [facility]: In a world with endless diet options, it can be hard to know which one is right for you. Research has proven the benefits of the Mediterranean Diet for many people, especially those at risk for heart disease. Beyond protecting your heart, the Mediterranean Diet can help you prevent or manage many other conditions.

## 2024-02-25 ENCOUNTER — Ambulatory Visit (HOSPITAL_COMMUNITY): Attending: Gastroenterology

## 2024-04-27 ENCOUNTER — Encounter (HOSPITAL_COMMUNITY): Payer: Self-pay

## 2024-04-27 ENCOUNTER — Emergency Department (HOSPITAL_COMMUNITY)

## 2024-04-27 ENCOUNTER — Inpatient Hospital Stay (HOSPITAL_COMMUNITY)
Admission: EM | Admit: 2024-04-27 | Discharge: 2024-04-29 | DRG: 074 | Disposition: A | Discharge status: Home / Self Care | Attending: Family Medicine | Admitting: Family Medicine

## 2024-04-27 ENCOUNTER — Other Ambulatory Visit: Payer: Self-pay

## 2024-04-27 DIAGNOSIS — E782 Mixed hyperlipidemia: Secondary | ICD-10-CM | POA: Diagnosis present

## 2024-04-27 DIAGNOSIS — E039 Hypothyroidism, unspecified: Secondary | ICD-10-CM | POA: Diagnosis present

## 2024-04-27 DIAGNOSIS — K3184 Gastroparesis: Secondary | ICD-10-CM | POA: Diagnosis present

## 2024-04-27 DIAGNOSIS — Z800 Family history of malignant neoplasm of digestive organs: Secondary | ICD-10-CM | POA: Diagnosis not present

## 2024-04-27 DIAGNOSIS — R112 Nausea with vomiting, unspecified: Principal | ICD-10-CM | POA: Diagnosis present

## 2024-04-27 DIAGNOSIS — Z885 Allergy status to narcotic agent status: Secondary | ICD-10-CM

## 2024-04-27 DIAGNOSIS — Z91041 Radiographic dye allergy status: Secondary | ICD-10-CM | POA: Diagnosis not present

## 2024-04-27 DIAGNOSIS — E871 Hypo-osmolality and hyponatremia: Secondary | ICD-10-CM | POA: Diagnosis present

## 2024-04-27 DIAGNOSIS — I2510 Atherosclerotic heart disease of native coronary artery without angina pectoris: Secondary | ICD-10-CM | POA: Diagnosis present

## 2024-04-27 DIAGNOSIS — R109 Unspecified abdominal pain: Secondary | ICD-10-CM | POA: Diagnosis not present

## 2024-04-27 DIAGNOSIS — D72829 Elevated white blood cell count, unspecified: Secondary | ICD-10-CM | POA: Diagnosis present

## 2024-04-27 DIAGNOSIS — D539 Nutritional anemia, unspecified: Secondary | ICD-10-CM | POA: Diagnosis present

## 2024-04-27 DIAGNOSIS — Z9049 Acquired absence of other specified parts of digestive tract: Secondary | ICD-10-CM | POA: Diagnosis not present

## 2024-04-27 DIAGNOSIS — Z91048 Other nonmedicinal substance allergy status: Secondary | ICD-10-CM

## 2024-04-27 DIAGNOSIS — F17210 Nicotine dependence, cigarettes, uncomplicated: Secondary | ICD-10-CM | POA: Diagnosis present

## 2024-04-27 DIAGNOSIS — T402X5A Adverse effect of other opioids, initial encounter: Secondary | ICD-10-CM | POA: Diagnosis present

## 2024-04-27 DIAGNOSIS — Z888 Allergy status to other drugs, medicaments and biological substances status: Secondary | ICD-10-CM | POA: Diagnosis not present

## 2024-04-27 DIAGNOSIS — E1143 Type 2 diabetes mellitus with diabetic autonomic (poly)neuropathy: Principal | ICD-10-CM | POA: Diagnosis present

## 2024-04-27 DIAGNOSIS — Z7951 Long term (current) use of inhaled steroids: Secondary | ICD-10-CM | POA: Diagnosis not present

## 2024-04-27 DIAGNOSIS — Z79890 Hormone replacement therapy (postmenopausal): Secondary | ICD-10-CM

## 2024-04-27 DIAGNOSIS — Z87820 Personal history of traumatic brain injury: Secondary | ICD-10-CM

## 2024-04-27 DIAGNOSIS — K219 Gastro-esophageal reflux disease without esophagitis: Secondary | ICD-10-CM | POA: Diagnosis present

## 2024-04-27 DIAGNOSIS — Z720 Tobacco use: Secondary | ICD-10-CM | POA: Diagnosis present

## 2024-04-27 DIAGNOSIS — K7581 Nonalcoholic steatohepatitis (NASH): Secondary | ICD-10-CM | POA: Diagnosis not present

## 2024-04-27 DIAGNOSIS — K5900 Constipation, unspecified: Secondary | ICD-10-CM | POA: Diagnosis present

## 2024-04-27 DIAGNOSIS — E063 Autoimmune thyroiditis: Secondary | ICD-10-CM | POA: Diagnosis present

## 2024-04-27 DIAGNOSIS — Z79899 Other long term (current) drug therapy: Secondary | ICD-10-CM | POA: Diagnosis not present

## 2024-04-27 DIAGNOSIS — Z8249 Family history of ischemic heart disease and other diseases of the circulatory system: Secondary | ICD-10-CM

## 2024-04-27 DIAGNOSIS — Z90710 Acquired absence of both cervix and uterus: Secondary | ICD-10-CM | POA: Diagnosis not present

## 2024-04-27 DIAGNOSIS — J449 Chronic obstructive pulmonary disease, unspecified: Secondary | ICD-10-CM | POA: Diagnosis present

## 2024-04-27 DIAGNOSIS — R1084 Generalized abdominal pain: Secondary | ICD-10-CM | POA: Diagnosis not present

## 2024-04-27 LAB — LIPASE, BLOOD: Lipase: 45 U/L (ref 11–51)

## 2024-04-27 LAB — COMPREHENSIVE METABOLIC PANEL WITH GFR
ALT: 27 U/L (ref 0–44)
AST: 30 U/L (ref 15–41)
Albumin: 4.8 g/dL (ref 3.5–5.0)
Alkaline Phosphatase: 112 U/L (ref 38–126)
Anion gap: 14 (ref 5–15)
BUN: 7 mg/dL (ref 6–20)
CO2: 22 mmol/L (ref 22–32)
Calcium: 10.3 mg/dL (ref 8.9–10.3)
Chloride: 95 mmol/L — ABNORMAL LOW (ref 98–111)
Creatinine, Ser: 0.78 mg/dL (ref 0.44–1.00)
GFR, Estimated: >60 mL/min (ref >60)
Glucose, Bld: 177 mg/dL — ABNORMAL HIGH (ref 70–99)
Potassium: 3.8 mmol/L (ref 3.5–5.1)
Sodium: 131 mmol/L — ABNORMAL LOW (ref 135–145)
Total Bilirubin: 0.8 mg/dL (ref 0.0–1.2)
Total Protein: 9 g/dL — ABNORMAL HIGH (ref 6.5–8.1)

## 2024-04-27 LAB — CBC
HCT: 45 % (ref 36.0–46.0)
Hemoglobin: 15.6 g/dL — ABNORMAL HIGH (ref 12.0–15.0)
MCH: 35.5 pg — ABNORMAL HIGH (ref 26.0–34.0)
MCHC: 34.7 g/dL (ref 30.0–36.0)
MCV: 102.3 fL — ABNORMAL HIGH (ref 80.0–100.0)
Platelets: 278 10*3/uL (ref 150–400)
RBC: 4.4 MIL/uL (ref 3.87–5.11)
RDW: 13.7 % (ref 11.5–15.5)
WBC: 13.3 10*3/uL — ABNORMAL HIGH (ref 4.0–10.5)
nRBC: 0 % (ref 0.0–0.2)

## 2024-04-27 MED ORDER — ONDANSETRON HCL 4 MG/2ML IJ SOLN
4.0000 mg | Freq: Four times a day (QID) | INTRAMUSCULAR | Status: DC | PRN
  Administered 2024-04-28 (×2): 4 mg via INTRAVENOUS
  Filled 2024-04-27 (×2): qty 2

## 2024-04-27 MED ORDER — ONDANSETRON HCL 4 MG/2ML IJ SOLN
4.0000 mg | Freq: Once | INTRAMUSCULAR | Status: AC | PRN
  Administered 2024-04-27: 4 mg via INTRAVENOUS
  Filled 2024-04-27: qty 2

## 2024-04-27 MED ORDER — DROPERIDOL 2.5 MG/ML IJ SOLN
1.2500 mg | Freq: Once | INTRAMUSCULAR | Status: AC
  Administered 2024-04-27: 1.25 mg via INTRAVENOUS
  Filled 2024-04-27: qty 2

## 2024-04-27 MED ORDER — PROMETHAZINE HCL 25 MG/ML IJ SOLN
INTRAMUSCULAR | Status: AC
  Filled 2024-04-27: qty 1

## 2024-04-27 MED ORDER — MORPHINE SULFATE (PF) 4 MG/ML IV SOLN
4.0000 mg | Freq: Once | INTRAVENOUS | Status: AC
  Administered 2024-04-27: 4 mg via INTRAVENOUS
  Filled 2024-04-27: qty 1

## 2024-04-27 MED ORDER — SODIUM CHLORIDE 0.9 % IV BOLUS
1000.0000 mL | Freq: Once | INTRAVENOUS | Status: AC
  Administered 2024-04-27: 1000 mL via INTRAVENOUS

## 2024-04-27 MED ORDER — SODIUM CHLORIDE 0.9 % IV SOLN
25.0000 mg | Freq: Once | INTRAVENOUS | Status: AC
  Administered 2024-04-27: 25 mg via INTRAVENOUS
  Filled 2024-04-27: qty 1

## 2024-04-27 MED ORDER — DIAZEPAM 5 MG/ML IJ SOLN
2.5000 mg | Freq: Once | INTRAMUSCULAR | Status: AC
  Administered 2024-04-27: 2.5 mg via INTRAVENOUS
  Filled 2024-04-27: qty 2
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
## 2024-04-27 NOTE — ED Triage Notes (Addendum)
 Pt has a hx of UC, GERD, esophagitis, presents with recurrent emesis and mid abd pain that started yesterday. Denies diarrhea, hematemesis, or bloody stool. Pt has vomited more than she can count. Last BM this AM and was described as loose. She does have a HA and she reports hurting in her chest after vomiting.

## 2024-04-27 NOTE — ED Provider Notes (Signed)
 [place] [facility] AT [facility] Provider Note   CSN: [id number] Arrival date & time: 04/27/24  1846     History  Chief Complaint  Patient presents with   Emesis    Melanie Shepard is a 58 y.o. female.  Patient with history of hyperlipidemia, GERD, seizures presents today with complaints of nausea, vomiting, and abdominal pain.  She states that same began yesterday and has been persistent since then.  She states that she has intervals of similar symptoms "a couple times a year" and states that she almost always has 2 come to the emergency department and be admitted for a few days to get her symptoms under control.  She states she has had several testing modalities performed to try to do her determine why this is happening without any clear answers.  She does note that someone at 1 point speculated that her history of a traumatic brain injury was contributing to these episodes.  Her pain is generalized throughout her abdomen and does not radiate.  She denies any diarrhea.  She is having regular bowel movements.  Denies any history of abdominal surgeries.  No urinary symptoms.  The history is provided by the patient. No language interpreter was used.  Emesis Associated symptoms: abdominal pain        Home Medications Prior to Admission medications   Medication Sig Start Date End Date Taking? Authorizing Provider  acetaminophen  (TYLENOL ) 325 MG tablet Take 2 tablets (650 mg total) by mouth every 6 (six) hours as needed for mild pain (pain score 1-3) (or Fever >/= 101). 10/24/23   Colin Dawley, MD  budesonide-formoterol  (SYMBICORT) 160-4.5 MCG/ACT inhaler Inhale 2 puffs into the lungs 2 (two) times daily. 05/10/22   [provider]  clonazePAM (KLONOPIN) 0.5 MG tablet Take 0.25-0.5 mg by mouth 2 (two) times daily as needed for anxiety. 09/26/23   [provider]  diphenhydramine -acetaminophen  (TYLENOL  PM) 25-500 MG TABS tablet Take 1 tablet by  mouth at bedtime as needed (sleep).    [provider]  doxylamine, Sleep, (UNISOM) 25 MG tablet Take 25 mg by mouth at bedtime as needed for sleep.    [provider]  Evolocumab  (REPATHA  SURECLICK) 140 MG/ML SOAJ INJECT 140mg  SUBCUTANEOUSLY EVERY TWO WEEKS AS DIRECTED    [provider]  ondansetron  (ZOFRAN -ODT) 8 MG disintegrating tablet Take 1 tablet (8 mg total) by mouth every 8 (eight) hours as needed for nausea or vomiting. 02/19/24   Eustacio Highman, NP  oxyCODONE  (OXY IR/ROXICODONE ) 5 MG immediate release tablet Take 5 mg by mouth See admin instructions. Q4-6 HRS PRN PAIN    [provider]  pantoprazole  (PROTONIX ) 40 MG tablet Take 1 tablet by mouth 2 (two) times daily.    [provider]  promethazine  (PHENERGAN ) 25 MG tablet Take 0.5-1 tablets (12.5-25 mg total) by mouth every 4 (four) hours as needed. 02/19/24   Eustacio Highman, NP  SYNTHROID  137 MCG tablet Take 137 mcg by mouth daily. 08/15/22   [provider]  tiZANidine  (ZANAFLEX ) 4 MG tablet Take 4 mg by mouth every 6 (six) hours as needed for muscle spasms. 05/17/23   [provider]  Vitamin D , Ergocalciferol , (DRISDOL ) 1.25 MG (50000 UNIT) CAPS capsule Take 1 capsule (50,000 Units total) by mouth every 7 (seven) days. 05/24/23   Sheril Dines, MD      Allergies    Escitalopram, Atorvastatin, Cymbalta [duloxetine hcl], Iodinated contrast media, Metoclopramide , Rosuvastatin, Trazodone  and nefazodone, and Tape  Review of Systems   Review of Systems  Gastrointestinal:  Positive for abdominal pain, nausea and vomiting.  All other systems reviewed and are negative.   Physical Exam Updated Vital Signs BP (!) 165/80 (BP Location: Right Arm)   Pulse 82   Temp 97.8 F (36.6 C) (Oral)   Resp 20   Ht 5\' 7"  (1.702 m)   Wt 59 kg   SpO2 96%   BMI 20.36 kg/m  Physical Exam Vitals and nursing note reviewed.  Constitutional:      General: She is not in acute  distress.    Appearance: Normal appearance. She is normal weight. She is not toxic-appearing or diaphoretic.     Comments: Unwell appearing  HENT:     Head: Normocephalic and atraumatic.  Cardiovascular:     Rate and Rhythm: Normal rate.  Pulmonary:     Effort: Pulmonary effort is normal. No respiratory distress.  Abdominal:     General: Abdomen is flat.     Palpations: Abdomen is soft.     Tenderness: There is abdominal tenderness. There is no guarding or rebound.  Musculoskeletal:        General: Normal range of motion.     Cervical back: Normal range of motion.  Skin:    General: Skin is warm and dry.  Neurological:     General: No focal deficit present.     Mental Status: She is alert.  Psychiatric:        Mood and Affect: Mood normal.        Behavior: Behavior normal.     ED Results / Procedures / Treatments   Labs (all labs ordered are listed, but only abnormal results are displayed) Labs Reviewed  COMPREHENSIVE METABOLIC PANEL WITH GFR - Abnormal; Notable for the following components:      Result Value   Sodium 131 (*)    Chloride 95 (*)    Glucose, Bld 177 (*)    Total Protein 9.0 (*)    All other components within normal limits  CBC - Abnormal; Notable for the following components:   WBC 13.3 (*)    Hemoglobin 15.6 (*)    MCV 102.3 (*)    MCH 35.5 (*)    All other components within normal limits  LIPASE, BLOOD  URINALYSIS, ROUTINE W REFLEX MICROSCOPIC    EKG EKG Interpretation Date/Time:  Sunday Apr 27 2024 19:25:39 EDT Ventricular Rate:  93 PR Interval:  146 QRS Duration:  74 QT Interval:  360 QTC Calculation: 447 R Axis:   89  Text Interpretation: Normal sinus rhythm ST & T wave abnormality, consider anterior ischemia  ST/T changes similar to Nov 2024 Confirmed by Jerilynn Montenegro [id number]) on 04/27/2024 7:44:16 PM  Radiology CT ABDOMEN PELVIS WO CONTRAST Result Date: 04/27/2024 CLINICAL DATA:  Acute abdominal pain and vomiting, initial encounter  EXAM: CT ABDOMEN AND PELVIS WITHOUT CONTRAST TECHNIQUE: Multidetector CT imaging of the abdomen and pelvis was performed following the standard protocol without IV contrast. RADIATION DOSE REDUCTION: This exam was performed according to the departmental dose-optimization program which includes automated exposure control, adjustment of the mA and/or kV according to patient size and/or use of iterative reconstruction technique. COMPARISON:  10/22/2023 FINDINGS: Lower chest: No acute abnormality. Hepatobiliary: Fatty infiltration of the liver is noted. The gallbladder has been surgically removed. Pancreas: Unremarkable. No pancreatic ductal dilatation or surrounding inflammatory changes. Spleen: Normal in size without focal abnormality. Adrenals/Urinary Tract: Adrenal glands are within normal limits. Kidneys are well visualized  bilaterally. No renal calculi are seen. No obstructive changes are noted. The ureters are within normal limits. The bladder is well distended. Stomach/Bowel: Scattered fecal material is noted throughout the colon. No obstructive or inflammatory changes are seen. The appendix is not visualized consistent with prior appendectomy. Small bowel and stomach are within normal limits. Vascular/Lymphatic: Aortic atherosclerosis. No enlarged abdominal or pelvic lymph nodes. Reproductive: Status post hysterectomy. No adnexal masses. Other: No abdominal wall hernia or abnormality. No abdominopelvic ascites. Musculoskeletal: No acute or significant osseous findings. IMPRESSION: No acute abnormality noted Electronically Signed   By: Violeta Grey M.D.   On: 04/27/2024 23:20    Procedures Procedures    Medications Ordered in ED Medications  ondansetron  (ZOFRAN ) injection 4 mg (has no administration in time range)  ondansetron  (ZOFRAN ) injection 4 mg (4 mg Intravenous Given 04/27/24 2046)  promethazine  (PHENERGAN ) 25 mg in sodium chloride  0.9 % 50 mL IVPB (0 mg Intravenous Stopped 04/27/24 2231)   morphine  (PF) 4 MG/ML injection 4 mg (4 mg Intravenous Given 04/27/24 2046)  sodium chloride  0.9 % bolus 1,000 mL (0 mLs Intravenous Stopped 04/27/24 2231)  droperidol  (INAPSINE ) 2.5 MG/ML injection 1.25 mg (1.25 mg Intravenous Given 04/27/24 2234)  diazepam  (VALIUM ) injection 2.5 mg (2.5 mg Intravenous Given 04/27/24 2235)    ED Course/ Medical Decision Making/ A&P                                 Medical Decision Making Amount and/or Complexity of Data Reviewed Labs: ordered. Radiology: ordered.  Risk Prescription drug management. Decision regarding hospitalization.   This patient is a 58 y.o. female who presents to the ED for concern of abdominal pain, nausea, vomiting, this involves an extensive number of treatment options, and is a complaint that carries with it a high risk of complications and morbidity. The emergent differential diagnosis prior to evaluation includes, but is not limited to,  The differential diagnosis for generalized abdominal pain includes, but is not limited to AAA, gastroenteritis, appendicitis, Bowel obstruction, Bowel perforation. Gastroparesis, DKA, Hernia, Inflammatory bowel disease, mesenteric ischemia, pancreatitis, peritonitis SBP, volvulus.  This is not an exhaustive differential.   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  Additional history: Chart reviewed. Pertinent results include: patient with several previous admissions for intractable nausea and vomiting  Physical Exam: Physical exam performed. The pertinent findings include: generalized abdominal tenderness to palpation throughout without rebound or guarding  Lab Tests: I ordered, and  personally interpreted labs.  The pertinent results include:  WBC 13.3, Na 131, chloride 95   Imaging Studies: I ordered imaging studies including CT abdomen pelvis. I independently visualized and interpreted imaging which showed NAD. I agree with the radiologist interpretation.   Medications: I ordered medication including fluids, Phenergan , Zofran , morphine , droperidol , Valium  for pain, nausea/vomiting. Reevaluation of the patient after these medicines showed that the patient improved. I have reviewed the patients home medicines and have made adjustments as needed.   Disposition: After consideration of the diagnostic results and the patients response to treatment, I feel that patient will require admission for intractable nausea and vomiting.  After above interventions, she is still having persistent emesis and is requesting admission.  Discussed patient with hospitalist Dr. Adefeso  who accepts patient for admission.   I discussed this case with my attending physician Dr. Aldean Amass who cosigned this note including patient's presenting symptoms, physical exam, and planned diagnostics and interventions. Attending physician stated agreement with plan or made changes to plan which were implemented.    Final Clinical Impression(s) / ED Diagnoses Final diagnoses:  Intractable nausea and vomiting    Rx / DC Orders ED Discharge Orders     None         Rennee Coyne A, PA-C 04/28/24 0003    Jerilynn Montenegro, MD 05/01/24 [phone]

## 2024-04-28 DIAGNOSIS — R109 Unspecified abdominal pain: Secondary | ICD-10-CM | POA: Diagnosis not present

## 2024-04-28 DIAGNOSIS — K7581 Nonalcoholic steatohepatitis (NASH): Secondary | ICD-10-CM | POA: Diagnosis not present

## 2024-04-28 DIAGNOSIS — R112 Nausea with vomiting, unspecified: Secondary | ICD-10-CM | POA: Diagnosis not present

## 2024-04-28 LAB — CBC
HCT: 38 % (ref 36.0–46.0)
Hemoglobin: 12.8 g/dL (ref 12.0–15.0)
MCH: 34.9 pg — ABNORMAL HIGH (ref 26.0–34.0)
MCHC: 33.7 g/dL (ref 30.0–36.0)
MCV: 103.5 fL — ABNORMAL HIGH (ref 80.0–100.0)
Platelets: 210 10*3/uL (ref 150–400)
RBC: 3.67 MIL/uL — ABNORMAL LOW (ref 3.87–5.11)
RDW: 13.8 % (ref 11.5–15.5)
WBC: 11.9 10*3/uL — ABNORMAL HIGH (ref 4.0–10.5)
nRBC: 0 % (ref 0.0–0.2)

## 2024-04-28 LAB — HEMOGLOBIN A1C
Hgb A1c MFr Bld: 5.8 % — ABNORMAL HIGH (ref 4.8–5.6)
Mean Plasma Glucose: 119.76 mg/dL

## 2024-04-28 LAB — COMPREHENSIVE METABOLIC PANEL WITH GFR
ALT: 21 U/L (ref 0–44)
AST: 22 U/L (ref 15–41)
Albumin: 4 g/dL (ref 3.5–5.0)
Alkaline Phosphatase: 89 U/L (ref 38–126)
Anion gap: 9 (ref 5–15)
BUN: 8 mg/dL (ref 6–20)
CO2: 25 mmol/L (ref 22–32)
Calcium: 9.3 mg/dL (ref 8.9–10.3)
Chloride: 98 mmol/L (ref 98–111)
Creatinine, Ser: 0.67 mg/dL (ref 0.44–1.00)
GFR, Estimated: >60 mL/min (ref >60)
Glucose, Bld: 163 mg/dL — ABNORMAL HIGH (ref 70–99)
Potassium: 3.8 mmol/L (ref 3.5–5.1)
Sodium: 132 mmol/L — ABNORMAL LOW (ref 135–145)
Total Bilirubin: 0.7 mg/dL (ref 0.0–1.2)
Total Protein: 7.8 g/dL (ref 6.5–8.1)

## 2024-04-28 LAB — PHOSPHORUS: Phosphorus: 3.5 mg/dL (ref 2.5–4.6)

## 2024-04-28 LAB — TSH: TSH: 0.873 u[IU]/mL (ref 0.350–4.500)

## 2024-04-28 LAB — MAGNESIUM: Magnesium: 1.8 mg/dL (ref 1.7–2.4)

## 2024-04-28 MED ORDER — LACTATED RINGERS IV SOLN: INTRAVENOUS | Status: AC

## 2024-04-28 MED ORDER — HYDROMORPHONE HCL 1 MG/ML IJ SOLN
0.5000 mg | INTRAMUSCULAR | Status: DC | PRN
  Administered 2024-04-28 – 2024-04-29 (×11): 0.5 mg via INTRAVENOUS
  Filled 2024-04-28 (×11): qty 0.5

## 2024-04-28 MED ORDER — SODIUM CHLORIDE 0.9 % IV SOLN: 12.5000 mg | Freq: Once | INTRAVENOUS | Status: DC

## 2024-04-28 MED ORDER — BISACODYL 10 MG RE SUPP
10.0000 mg | Freq: Once | RECTAL | Status: DC
  Filled 2024-04-28: qty 1

## 2024-04-28 MED ORDER — PROMETHAZINE HCL 25 MG/ML IJ SOLN
INTRAMUSCULAR | Status: AC
  Filled 2024-04-28: qty 1

## 2024-04-28 MED ORDER — ENOXAPARIN SODIUM 40 MG/0.4ML IJ SOSY
40.0000 mg | PREFILLED_SYRINGE | INTRAMUSCULAR | Status: DC
  Administered 2024-04-28 – 2024-04-29 (×2): 40 mg via SUBCUTANEOUS
  Filled 2024-04-28 (×2): qty 0.4

## 2024-04-28 MED ORDER — PANTOPRAZOLE SODIUM 40 MG PO TBEC
40.0000 mg | DELAYED_RELEASE_TABLET | Freq: Two times a day (BID) | ORAL | Status: DC
  Administered 2024-04-28: 40 mg via ORAL
  Filled 2024-04-28: qty 1

## 2024-04-28 MED ORDER — LEVOTHYROXINE SODIUM 137 MCG PO TABS
137.0000 ug | ORAL_TABLET | Freq: Every day | ORAL | Status: DC
  Filled 2024-04-28 (×3): qty 1

## 2024-04-28 MED ORDER — ACETAMINOPHEN 650 MG RE SUPP: 650.0000 mg | Freq: Four times a day (QID) | RECTAL | Status: DC | PRN

## 2024-04-28 MED ORDER — SODIUM CHLORIDE 0.9 % IV SOLN
12.5000 mg | Freq: Once | INTRAVENOUS | Status: DC
  Administered 2024-04-28: 12.5 mg via INTRAVENOUS
  Filled 2024-04-28: qty 0.5

## 2024-04-28 MED ORDER — ONDANSETRON HCL 4 MG/2ML IJ SOLN
4.0000 mg | Freq: Three times a day (TID) | INTRAMUSCULAR | Status: DC
  Administered 2024-04-28 – 2024-04-29 (×4): 4 mg via INTRAVENOUS
  Filled 2024-04-28 (×4): qty 2

## 2024-04-28 MED ORDER — DIPHENHYDRAMINE HCL 25 MG PO CAPS
25.0000 mg | ORAL_CAPSULE | Freq: Once | ORAL | Status: DC
  Filled 2024-04-28: qty 1

## 2024-04-28 MED ORDER — LACTULOSE 10 GM/15ML PO SOLN
30.0000 g | Freq: Once | ORAL | Status: DC
  Filled 2024-04-28: qty 60

## 2024-04-28 MED ORDER — SODIUM CHLORIDE 0.9 % IV SOLN
12.5000 mg | Freq: Once | INTRAVENOUS | Status: AC
  Administered 2024-04-28: 12.5 mg via INTRAVENOUS
  Filled 2024-04-28: qty 0.5

## 2024-04-28 MED ORDER — PANTOPRAZOLE SODIUM 40 MG IV SOLR
40.0000 mg | Freq: Two times a day (BID) | INTRAVENOUS | Status: DC
  Administered 2024-04-28 – 2024-04-29 (×2): 40 mg via INTRAVENOUS
  Filled 2024-04-28 (×3): qty 10

## 2024-04-28 MED ORDER — SODIUM CHLORIDE 0.9 % IV SOLN
12.5000 mg | Freq: Once | INTRAVENOUS | Status: AC
  Filled 2024-04-28: qty 0.5

## 2024-04-28 MED ORDER — ACETAMINOPHEN 325 MG PO TABS: 650.0000 mg | ORAL_TABLET | Freq: Four times a day (QID) | ORAL | Status: DC | PRN

## 2024-04-28 MED ORDER — NICOTINE 21 MG/24HR TD PT24
21.0000 mg | MEDICATED_PATCH | Freq: Every day | TRANSDERMAL | Status: DC
  Filled 2024-04-28 (×2): qty 1

## 2024-04-28 MED ORDER — MELATONIN 3 MG PO TABS
6.0000 mg | ORAL_TABLET | Freq: Every evening | ORAL | Status: DC | PRN
  Administered 2024-04-28: 6 mg via ORAL
  Filled 2024-04-28 (×2): qty 2

## 2024-04-28 NOTE — Progress Notes (Signed)
 Patient has had multiple episodes of emesis throughout the night. Patient received Zofran  twice and Phenergan  once, neither have seemed to help. She also has received Dilaudid  twice this shift. Patient unable to take her synthroid  this morning, not able to keep anything down.
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
## 2024-04-28 NOTE — Progress Notes (Signed)
 Transition of Care Department [facility]) has reviewed patient and no other TOC needs have been identified at this time. We will continue to monitor patient advancement through interdisciplinary progression rounds. If new patient transition needs arise, please place a TOC consult.   04/28/24 0828  TOC Brief Assessment  Insurance and Status Reviewed  Patient has primary care physician Yes [facility] in [place])  Home environment has been reviewed Lives with husband.  Prior level of function: Husband assists.  Prior/Current Home Services No current home services  Social Drivers of Health Review SDOH reviewed no interventions necessary  Readmission risk has been reviewed Yes  Transition of care needs no transition of care needs at this time

## 2024-04-28 NOTE — Progress Notes (Signed)
 Nurse at bedside,patient c/o abdominal pain,8 sharp,constant,Dilaudid  0.5 mg' IV given per [facility] prn.Patient emotional at times,emotional support provided. Plan of care on going.Family at the bedside.Aaron Aas

## 2024-04-28 NOTE — Progress Notes (Signed)
 Nurse at bedside,patient alert to person,place,and a little confuse to time,oriented to her situation.Patient has high levels of anxiety,patient is very demanding. Patient's behavior is off,very argumentative with spouse and staff at times.Patient c/o abdominal pain rated a 9 sharp,and constant, Dilaudid  0.5 mg's IV given for pain per [facility] prn.Plan of care on going.

## 2024-04-28 NOTE — Progress Notes (Signed)
 Nurse at bedside,patient refused her nicotine  patch, Dulcolax suppository,and Lactulose .Dr courage notified.Plan of care on going.

## 2024-04-28 NOTE — Progress Notes (Signed)
 Nurse at bedside,patient c/o abdominal pain rated a 9,sharp and constant,Dilaudid  0.5 mg's IV given per [facility] prn. Plan of care on going.

## 2024-04-28 NOTE — Progress Notes (Signed)
 Nurse at bedside,patient c/o abdominal pain rated a 9,harp and constant,Dilaudid  0.5 mg's IV given for pain per [facility] prn. Plan of care on going.

## 2024-04-28 NOTE — Plan of Care (Signed)
   Problem: Education: Goal: Knowledge of General Education information will improve Description Including pain rating scale, medication(s)/side effects and non-pharmacologic comfort measures Outcome: Progressing   Problem: Education: Goal: Knowledge of General Education information will improve Description Including pain rating scale, medication(s)/side effects and non-pharmacologic comfort measures Outcome: Progressing

## 2024-04-29 ENCOUNTER — Telehealth: Payer: Self-pay | Admitting: Gastroenterology

## 2024-04-29 ENCOUNTER — Ambulatory Visit: Admitting: Gastroenterology

## 2024-04-29 DIAGNOSIS — R112 Nausea with vomiting, unspecified: Secondary | ICD-10-CM | POA: Diagnosis not present

## 2024-04-29 DIAGNOSIS — R1084 Generalized abdominal pain: Secondary | ICD-10-CM

## 2024-04-29 DIAGNOSIS — K7581 Nonalcoholic steatohepatitis (NASH): Secondary | ICD-10-CM | POA: Diagnosis not present

## 2024-04-29 MED ORDER — NICOTINE 21 MG/24HR TD PT24: 21.0000 mg | MEDICATED_PATCH | Freq: Every day | TRANSDERMAL | 0 refills | Status: AC

## 2024-04-29 MED ORDER — ACETAMINOPHEN 325 MG PO TABS: 650.0000 mg | ORAL_TABLET | Freq: Four times a day (QID) | ORAL | Status: DC | PRN

## 2024-04-29 MED ORDER — ONDANSETRON 8 MG PO TBDP: 8.0000 mg | ORAL_TABLET | Freq: Three times a day (TID) | ORAL | 2 refills | Status: AC | PRN

## 2024-04-29 MED ORDER — TIZANIDINE HCL 4 MG PO TABS: 4.0000 mg | ORAL_TABLET | Freq: Four times a day (QID) | ORAL | 0 refills | Status: AC | PRN

## 2024-04-29 MED ORDER — LACTULOSE 10 GM/15ML PO SOLN: 30.0000 g | Freq: Every day | ORAL | 0 refills | Status: AC | PRN

## 2024-04-29 MED ORDER — CLONAZEPAM 0.5 MG PO TABS
1.0000 mg | ORAL_TABLET | Freq: Two times a day (BID) | ORAL | Status: DC
  Administered 2024-04-29 (×2): 1 mg via ORAL
  Filled 2024-04-29 (×2): qty 2

## 2024-04-29 MED ORDER — BUDESONIDE-FORMOTEROL FUMARATE 160-4.5 MCG/ACT IN AERO: 2.0000 | INHALATION_SPRAY | Freq: Two times a day (BID) | RESPIRATORY_TRACT | 12 refills | Status: AC

## 2024-04-29 MED ORDER — PROMETHAZINE HCL 25 MG PO TABS: 12.5000 mg | ORAL_TABLET | ORAL | 1 refills | Status: DC | PRN

## 2024-04-29 NOTE — Telephone Encounter (Signed)
 Patient will be discharge from the hospital today.  Please arrange outpatient follow-up with Karna Pacas, NP in 1-2 weeks.
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
## 2024-04-29 NOTE — Discharge Instructions (Signed)
 1) maintain adequate hydration--soft diet advised 2) avoid constipation----May use over-the-counter laxatives or lactulose  as prescribed 3) okay to use nicotine  patch to help you quit smoking 4) follow-up with gastroenterology as outpatient--as previously advised

## 2024-04-29 NOTE — Plan of Care (Signed)
  Problem: Clinical Measurements: Goal: Will remain free from infection Outcome: Progressing   Problem: Activity: Goal: Risk for activity intolerance will decrease Outcome: Progressing   Problem: Coping: Goal: Level of anxiety will decrease Outcome: Progressing

## 2024-04-29 NOTE — Progress Notes (Signed)
 Nurse at bedside with discharge instructions given to patient and spouse on medications and follow up visits,verbalized understanding.Prescriptions can be picked up at Pharmacy documented on AVS.IV discontinued,catheter intact.Accompanied by staff to an awaiting vehicle.

## 2024-04-29 NOTE — Progress Notes (Signed)
 Nurse at bedside,patient alert and oriented to person,place,and situation,confused to time.Patient c/o abdominal pain rated a 9,sharp,constant.Dilaudid  0.5 mg's IV given for pain per MAR prn. Plan of care on going.
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
## 2024-04-29 NOTE — Care Management Important Message (Signed)
 Important Message  Patient Details  Name: Melanie Shepard MRN: [id number] Date of Birth: 1966-05-31   Important Message Given:  N/A - LOS <3 / Initial given by admissions     Neila Bally 04/29/2024, 11:21 AM

## 2024-05-03 ENCOUNTER — Other Ambulatory Visit: Payer: Self-pay

## 2024-05-03 ENCOUNTER — Encounter (HOSPITAL_COMMUNITY): Payer: Self-pay | Admitting: *Deleted

## 2024-05-03 ENCOUNTER — Inpatient Hospital Stay (HOSPITAL_COMMUNITY)
Admission: EM | Admit: 2024-05-03 | Discharge: 2024-05-07 | DRG: 641 | Disposition: A | Discharge status: Home / Self Care | Attending: Family Medicine | Admitting: Family Medicine

## 2024-05-03 DIAGNOSIS — Z79890 Hormone replacement therapy (postmenopausal): Secondary | ICD-10-CM

## 2024-05-03 DIAGNOSIS — R1110 Vomiting, unspecified: Secondary | ICD-10-CM | POA: Diagnosis present

## 2024-05-03 DIAGNOSIS — Z800 Family history of malignant neoplasm of digestive organs: Secondary | ICD-10-CM

## 2024-05-03 DIAGNOSIS — Z90710 Acquired absence of both cervix and uterus: Secondary | ICD-10-CM

## 2024-05-03 DIAGNOSIS — E785 Hyperlipidemia, unspecified: Secondary | ICD-10-CM | POA: Diagnosis present

## 2024-05-03 DIAGNOSIS — E039 Hypothyroidism, unspecified: Secondary | ICD-10-CM | POA: Diagnosis present

## 2024-05-03 DIAGNOSIS — Z87820 Personal history of traumatic brain injury: Secondary | ICD-10-CM

## 2024-05-03 DIAGNOSIS — I10 Essential (primary) hypertension: Secondary | ICD-10-CM | POA: Diagnosis present

## 2024-05-03 DIAGNOSIS — E860 Dehydration: Principal | ICD-10-CM | POA: Diagnosis present

## 2024-05-03 DIAGNOSIS — E119 Type 2 diabetes mellitus without complications: Secondary | ICD-10-CM

## 2024-05-03 DIAGNOSIS — Z716 Tobacco abuse counseling: Secondary | ICD-10-CM

## 2024-05-03 DIAGNOSIS — I2510 Atherosclerotic heart disease of native coronary artery without angina pectoris: Secondary | ICD-10-CM | POA: Diagnosis present

## 2024-05-03 DIAGNOSIS — E063 Autoimmune thyroiditis: Secondary | ICD-10-CM | POA: Diagnosis present

## 2024-05-03 DIAGNOSIS — E871 Hypo-osmolality and hyponatremia: Secondary | ICD-10-CM | POA: Diagnosis present

## 2024-05-03 DIAGNOSIS — Z8249 Family history of ischemic heart disease and other diseases of the circulatory system: Secondary | ICD-10-CM

## 2024-05-03 DIAGNOSIS — G8929 Other chronic pain: Secondary | ICD-10-CM | POA: Diagnosis present

## 2024-05-03 DIAGNOSIS — Z7951 Long term (current) use of inhaled steroids: Secondary | ICD-10-CM

## 2024-05-03 DIAGNOSIS — F411 Generalized anxiety disorder: Secondary | ICD-10-CM | POA: Diagnosis not present

## 2024-05-03 DIAGNOSIS — J439 Emphysema, unspecified: Secondary | ICD-10-CM | POA: Diagnosis present

## 2024-05-03 DIAGNOSIS — D72829 Elevated white blood cell count, unspecified: Secondary | ICD-10-CM | POA: Diagnosis present

## 2024-05-03 DIAGNOSIS — F17210 Nicotine dependence, cigarettes, uncomplicated: Secondary | ICD-10-CM | POA: Diagnosis present

## 2024-05-03 DIAGNOSIS — R112 Nausea with vomiting, unspecified: Secondary | ICD-10-CM | POA: Diagnosis present

## 2024-05-03 DIAGNOSIS — E876 Hypokalemia: Secondary | ICD-10-CM | POA: Diagnosis present

## 2024-05-03 DIAGNOSIS — G40909 Epilepsy, unspecified, not intractable, without status epilepticus: Secondary | ICD-10-CM | POA: Diagnosis present

## 2024-05-03 LAB — COMPREHENSIVE METABOLIC PANEL WITH GFR
ALT: 15 U/L (ref 0–44)
AST: 17 U/L (ref 15–41)
Albumin: 5 g/dL (ref 3.5–5.0)
Alkaline Phosphatase: 113 U/L (ref 38–126)
Anion gap: 11 (ref 5–15)
BUN: 6 mg/dL (ref 6–20)
CO2: 21 mmol/L — ABNORMAL LOW (ref 22–32)
Calcium: 10 mg/dL (ref 8.9–10.3)
Chloride: 102 mmol/L (ref 98–111)
Creatinine, Ser: 0.84 mg/dL (ref 0.44–1.00)
GFR, Estimated: >60 mL/min (ref >60)
Glucose, Bld: 171 mg/dL — ABNORMAL HIGH (ref 70–99)
Potassium: 4.6 mmol/L (ref 3.5–5.1)
Sodium: 134 mmol/L — ABNORMAL LOW (ref 135–145)
Total Bilirubin: 1 mg/dL (ref 0.0–1.2)
Total Protein: 9.1 g/dL — ABNORMAL HIGH (ref 6.5–8.1)

## 2024-05-03 LAB — CBC WITH DIFFERENTIAL/PLATELET
Abs Immature Granulocytes: 0.1 10*3/uL — ABNORMAL HIGH (ref 0.00–0.07)
Basophils Absolute: 0.1 10*3/uL (ref 0.0–0.1)
Basophils Relative: 1 %
Eosinophils Absolute: 0.2 10*3/uL (ref 0.0–0.5)
Eosinophils Relative: 1 %
HCT: 47 % — ABNORMAL HIGH (ref 36.0–46.0)
Hemoglobin: 16.4 g/dL — ABNORMAL HIGH (ref 12.0–15.0)
Immature Granulocytes: 1 %
Lymphocytes Relative: 25 %
Lymphs Abs: 3.6 10*3/uL (ref 0.7–4.0)
MCH: 35.8 pg — ABNORMAL HIGH (ref 26.0–34.0)
MCHC: 34.9 g/dL (ref 30.0–36.0)
MCV: 102.6 fL — ABNORMAL HIGH (ref 80.0–100.0)
Monocytes Absolute: 0.4 10*3/uL (ref 0.1–1.0)
Monocytes Relative: 3 %
Neutro Abs: 10.2 10*3/uL — ABNORMAL HIGH (ref 1.7–7.7)
Neutrophils Relative %: 69 %
Platelets: 244 10*3/uL (ref 150–400)
RBC: 4.58 MIL/uL (ref 3.87–5.11)
RDW: 13.4 % (ref 11.5–15.5)
WBC: 14.5 10*3/uL — ABNORMAL HIGH (ref 4.0–10.5)
nRBC: 0 % (ref 0.0–0.2)

## 2024-05-03 LAB — GLUCOSE, CAPILLARY: Glucose-Capillary: 119 mg/dL — ABNORMAL HIGH (ref 70–99)

## 2024-05-03 MED ORDER — CLONAZEPAM 0.5 MG PO TABS
1.0000 mg | ORAL_TABLET | Freq: Two times a day (BID) | ORAL | Status: DC
  Administered 2024-05-04 – 2024-05-07 (×7): 1 mg via ORAL
  Filled 2024-05-03 (×7): qty 2

## 2024-05-03 MED ORDER — ONDANSETRON HCL 4 MG/2ML IJ SOLN
4.0000 mg | Freq: Four times a day (QID) | INTRAMUSCULAR | Status: DC | PRN
  Administered 2024-05-03 – 2024-05-07 (×11): 4 mg via INTRAVENOUS
  Filled 2024-05-03 (×13): qty 2

## 2024-05-03 MED ORDER — POLYETHYLENE GLYCOL 3350 17 G PO PACK: 17.0000 g | PACK | Freq: Every day | ORAL | Status: DC | PRN

## 2024-05-03 MED ORDER — LACTATED RINGERS IV BOLUS: 1000.0000 mL | Freq: Once | INTRAVENOUS | Status: DC

## 2024-05-03 MED ORDER — INSULIN ASPART 100 UNIT/ML IJ SOLN
0.0000 [IU] | Freq: Three times a day (TID) | INTRAMUSCULAR | Status: DC
  Administered 2024-05-04: 1 [IU] via SUBCUTANEOUS
  Administered 2024-05-05: 2 [IU] via SUBCUTANEOUS
  Administered 2024-05-05 – 2024-05-06 (×2): 1 [IU] via SUBCUTANEOUS

## 2024-05-03 MED ORDER — PROCHLORPERAZINE EDISYLATE 10 MG/2ML IJ SOLN
10.0000 mg | Freq: Once | INTRAMUSCULAR | Status: AC
  Administered 2024-05-03: 10 mg via INTRAVENOUS
  Filled 2024-05-03: qty 2

## 2024-05-03 MED ORDER — ONDANSETRON HCL 4 MG/2ML IJ SOLN
4.0000 mg | Freq: Once | INTRAMUSCULAR | Status: AC
  Administered 2024-05-03: 4 mg via INTRAVENOUS
  Filled 2024-05-03: qty 2

## 2024-05-03 MED ORDER — ENOXAPARIN SODIUM 40 MG/0.4ML IJ SOSY
40.0000 mg | PREFILLED_SYRINGE | INTRAMUSCULAR | Status: DC
  Administered 2024-05-03 – 2024-05-06 (×4): 40 mg via SUBCUTANEOUS
  Filled 2024-05-03 (×4): qty 0.4

## 2024-05-03 MED ORDER — LORAZEPAM 2 MG/ML IJ SOLN
0.5000 mg | Freq: Once | INTRAMUSCULAR | Status: AC
  Administered 2024-05-03: 0.5 mg via INTRAVENOUS
  Filled 2024-05-03: qty 1

## 2024-05-03 MED ORDER — SODIUM CHLORIDE 0.9 % IV BOLUS
1000.0000 mL | Freq: Once | INTRAVENOUS | Status: AC
Start: 2024-05-03 — End: 2024-05-03
  Administered 2024-05-03: 1000 mL via INTRAVENOUS

## 2024-05-03 MED ORDER — ACETAMINOPHEN 325 MG PO TABS
650.0000 mg | ORAL_TABLET | Freq: Four times a day (QID) | ORAL | Status: DC | PRN
  Administered 2024-05-03: 650 mg via ORAL
  Filled 2024-05-03: qty 2

## 2024-05-03 MED ORDER — ACETAMINOPHEN 650 MG RE SUPP: 650.0000 mg | Freq: Four times a day (QID) | RECTAL | Status: DC | PRN

## 2024-05-03 MED ORDER — HYDROMORPHONE HCL 1 MG/ML IJ SOLN
0.5000 mg | INTRAMUSCULAR | Status: DC | PRN
  Administered 2024-05-03 – 2024-05-07 (×19): 0.5 mg via INTRAVENOUS
  Filled 2024-05-03 (×20): qty 0.5

## 2024-05-03 MED ORDER — ONDANSETRON HCL 4 MG PO TABS
4.0000 mg | ORAL_TABLET | Freq: Four times a day (QID) | ORAL | Status: DC | PRN
  Administered 2024-05-06: 4 mg via ORAL
  Filled 2024-05-03: qty 1

## 2024-05-03 MED ORDER — DROPERIDOL 2.5 MG/ML IJ SOLN
1.2500 mg | Freq: Once | INTRAMUSCULAR | Status: AC
  Administered 2024-05-03: 1.25 mg via INTRAVENOUS
  Filled 2024-05-03: qty 2

## 2024-05-03 MED ORDER — SODIUM CHLORIDE 0.9 % IV SOLN: INTRAVENOUS | Status: DC

## 2024-05-03 MED ORDER — HYDROMORPHONE HCL 1 MG/ML IJ SOLN
1.0000 mg | Freq: Once | INTRAMUSCULAR | Status: AC
  Administered 2024-05-03: 1 mg via INTRAVENOUS
  Filled 2024-05-03: qty 1

## 2024-05-03 MED ORDER — DIPHENHYDRAMINE HCL 50 MG/ML IJ SOLN
25.0000 mg | Freq: Once | INTRAMUSCULAR | Status: AC
  Administered 2024-05-03: 25 mg via INTRAVENOUS
  Filled 2024-05-03: qty 1

## 2024-05-03 NOTE — Assessment & Plan Note (Signed)
 Diet controlled.  Not on medications.  A1c 5.8- 5/19. - SSI- S

## 2024-05-03 NOTE — ED Triage Notes (Signed)
 Pt recently admitted for N/V, discharged on Tuesday.  Emesis x 1 yesterday, worse today. + abd pain + HA
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
## 2024-05-03 NOTE — Assessment & Plan Note (Signed)
 Resume clonazepam  twice daily

## 2024-05-03 NOTE — ED Notes (Signed)
 SWOT called for US  IV

## 2024-05-03 NOTE — ED Notes (Signed)
 Notified provider about inability to obtain access. Meds and labs delayed due to inability to obtain access. Provider and SWOT are at bedside.

## 2024-05-03 NOTE — Assessment & Plan Note (Signed)
Resume Synthroid ?

## 2024-05-03 NOTE — Assessment & Plan Note (Addendum)
 Chronic intermittent vomiting abdominal pain, presenting with intractable vomiting and abdominal pain.  Recent hospitalization for same-managed conservatively with the help of GI.  Recent CT abdomen and pelvis 5/18 negative for acute abnormality.  Today leukocytosis of - 14.5.  Prior extensive GI workup including EGD on 01/02/2024 with esophageal dilatation at the time (also had EGD previously November 2023 and back in 2021). Last colonoscopy in 2020 -Defer repeat abdominal imaging for now - 2 L bolus given, continue N/s 75 cc/hr x 1 day - Conservative management with Zofran , Dilaudid  - Trend leukocytosis

## 2024-05-03 NOTE — Assessment & Plan Note (Signed)
 Systolic 150s to 035K.  Not on antihypertensives. - PRN labetalol 10 mg for systolic greater than 180

## 2024-05-03 NOTE — ED Provider Notes (Signed)
 [place] EMERGENCY DEPARTMENT AT [facility] Provider Note   CSN: [id number] Arrival date & time: 05/03/24  1324     History  Chief Complaint  Patient presents with   Emesis    Melanie Shepard is a 58 y.o. female who presents to the emergency department with a chief complaint of nausea/vomiting and abdominal pain.  Patiently was recently discharged for similar complaint on Tuesday (04/29/24). Patient also complaining of abdominal pain and headache.  States she has these episodes of excessive nausea and vomiting a few times a year.  Patient states that in the past she has been admitted to the hospital, treated, and her symptoms mostly resolved until the next episode.  Patient states that this has been an ongoing chronic issue for many years and that it has been worked up outpatient.  Denies fever, chills, hematemesis, issues with urination or passing a stool.  Medical history significant for type 2 diabetes, intractable nausea and vomiting, persistent emesis, hypokalemia, esophageal dysphagia, etc.   Emesis Associated symptoms: abdominal pain   Associated symptoms: no chills, no diarrhea and no fever       Home Medications Prior to Admission medications   Medication Sig Start Date End Date Taking? Authorizing Provider  acetaminophen  (TYLENOL ) 325 MG tablet Take 2 tablets (650 mg total) by mouth every 6 (six) hours as needed for mild pain (pain score 1-3) (or Fever >/= 101). 04/29/24   Colin Dawley, MD  budesonide-formoterol  (SYMBICORT) 160-4.5 MCG/ACT inhaler Inhale 2 puffs into the lungs 2 (two) times daily. 04/29/24   Colin Dawley, MD  clonazePAM (KLONOPIN) 1 MG tablet Take 1 tablet by mouth 2 (two) times daily. 01/16/24   [provider]  diphenhydramine -acetaminophen  (TYLENOL  PM) 25-500 MG TABS tablet Take 1 tablet by mouth at bedtime as needed (sleep).    [provider]  doxylamine, Sleep, (UNISOM) 25 MG tablet Take 25 mg by mouth at bedtime as needed  for sleep.    [provider]  Evolocumab  (REPATHA  SURECLICK) 140 MG/ML SOAJ INJECT 140mg  SUBCUTANEOUSLY EVERY TWO WEEKS AS DIRECTED    [provider]  lactulose  (CHRONULAC ) 10 GM/15ML solution Take 45 mLs (30 g total) by mouth daily as needed for mild constipation or moderate constipation. 04/29/24   Colin Dawley, MD  nicotine  (NICODERM CQ  - DOSED IN MG/24 HOURS) 21 mg/24hr patch Place 1 patch (21 mg total) onto the skin daily. 04/30/24   Colin Dawley, MD  ondansetron  (ZOFRAN -ODT) 8 MG disintegrating tablet Take 1 tablet (8 mg total) by mouth every 8 (eight) hours as needed for nausea or vomiting. 04/29/24   Colin Dawley, MD  oxyCODONE  (OXY IR/ROXICODONE ) 5 MG immediate release tablet Take 5 mg by mouth See admin instructions. Q4-6 HRS PRN PAIN    [provider]  pantoprazole  (PROTONIX ) 40 MG tablet Take 1 tablet by mouth 2 (two) times daily.    [provider]  promethazine  (PHENERGAN ) 25 MG tablet Take 0.5-1 tablets (12.5-25 mg total) by mouth every 4 (four) hours as needed for vomiting or nausea. 04/29/24   Colin Dawley, MD  SYNTHROID  137 MCG tablet Take 137 mcg by mouth daily. 08/15/22   [provider]  tiZANidine  (ZANAFLEX ) 4 MG tablet Take 1 tablet (4 mg total) by mouth every 6 (six) hours as needed for muscle spasms. 04/29/24   Colin Dawley, MD      Allergies    Escitalopram, Atorvastatin, Cymbalta [duloxetine hcl], Iodinated contrast media, Metoclopramide , Rosuvastatin, Trazodone  and nefazodone, and Tape  Review of Systems   Review of Systems  Constitutional:  Positive for appetite change and fatigue. Negative for chills and fever.  Eyes:  Negative for visual disturbance.  Respiratory:  Negative for chest tightness and shortness of breath.   Cardiovascular:  Negative for chest pain and palpitations.  Gastrointestinal:  Positive for abdominal pain, nausea and vomiting. Negative for abdominal distention, constipation and  diarrhea.  Genitourinary:  Negative for difficulty urinating and hematuria.  Skin:  Negative for rash and wound.  Neurological:  Negative for weakness.    Physical Exam Updated Vital Signs BP (!) 150/118 (BP Location: Right Arm)   Pulse (!) 111   Temp 97.6 F (36.4 C) (Temporal)   Resp 20   Ht 5\' 7"  (1.702 m)   Wt 61.2 kg   SpO2 98%   BMI 21.14 kg/m  Physical Exam Vitals and nursing note reviewed.  Constitutional:      General: She is awake. She is not in acute distress.    Appearance: Normal appearance.  HENT:     Head: Normocephalic and atraumatic.  Eyes:     General: No scleral icterus.    Extraocular Movements: Extraocular movements intact.  Cardiovascular:     Rate and Rhythm: Regular rhythm. Tachycardia present.  Pulmonary:     Effort: Pulmonary effort is normal. No respiratory distress.     Breath sounds: No wheezing, rhonchi or rales.  Abdominal:     General: Abdomen is flat.     Palpations: Abdomen is soft.     Tenderness: There is abdominal tenderness (mild tenderness in epigastric region of abdomen). There is no guarding or rebound.  Skin:    General: Skin is warm and dry.     Capillary Refill: Capillary refill takes less than 2 seconds.  Neurological:     General: No focal deficit present.     Mental Status: She is alert and oriented to person, place, and time.     Motor: No weakness.  Psychiatric:        Mood and Affect: Mood normal.        Behavior: Behavior normal. Behavior is cooperative.    ED Results / Procedures / Treatments   Labs (all labs ordered are listed, but only abnormal results are displayed) Labs Reviewed  CBC WITH DIFFERENTIAL/PLATELET - Abnormal; Notable for the following components:      Result Value   WBC 14.5 (*)    Hemoglobin 16.4 (*)    HCT 47.0 (*)    MCV 102.6 (*)    MCH 35.8 (*)    Neutro Abs 10.2 (*)    Abs Immature Granulocytes 0.10 (*)    All other components within normal limits  COMPREHENSIVE METABOLIC PANEL  WITH GFR    EKG None  Radiology No results found.  Procedures Procedures    Medications Ordered in ED Medications  ondansetron  (ZOFRAN ) injection 4 mg (4 mg Intravenous Given 05/03/24 1454)  sodium chloride  0.9 % bolus 1,000 mL (1,000 mLs Intravenous New Bag/Given 05/03/24 1455)  prochlorperazine  (COMPAZINE ) injection 10 mg (10 mg Intravenous Given 05/03/24 1453)  diphenhydrAMINE  (BENADRYL ) injection 25 mg (25 mg Intravenous Given 05/03/24 1454)  HYDROmorphone  (DILAUDID ) injection 1 mg (1 mg Intravenous Given 05/03/24 1506)    ED Course/ Medical Decision Making/ A&P    Patient presents to the ED for concern of nausea/vomiting, abdominal pain, this involves an extensive number of treatment options, and is a complaint that carries with it a high risk of complications and morbidity.  The differential diagnosis includes cholecystitis, ascending cholangitis, appendicitis, pancreatitis, diverticulitis, small bowel obstruction, hyperemesis syndrome, viral gastroenteritis, sepsis, etc.   Co morbidities that complicate the patient evaluation  Chronic nausea/vomiting/abdominal pain   Additional history obtained:  Additional history obtained from Past Admission   External records from outside source obtained and reviewed including previous admission where patient was discharged on Tuesday, 04/29/2024 for similar complaints   Lab Tests:  I Ordered, and personally interpreted labs.  The pertinent results include: CBC -WBC 14.5, hemoglobin 16.4, HCT 47, neutrophils 10.2, CMP- PENDING   Medicines ordered and prescription drug management:  I ordered medication including Benadryl , Compazine , Zofran  for nausea, Dilaudid  for pain, fluids for vomiting and suspected dehydration Reevaluation of the patient after these medicines showed that the patient stayed the same I have reviewed the patients home medicines and have made adjustments as needed   Test Considered:  CT abdomen pelvis:  Declined at this time, patient was just recently scanned and has had an extensive outpatient workup for this similar complaint, based on vital signs and history as well as clinical presentation today I do not believe at time of handoff that imaging would help diagnosis and treatment plan   Critical Interventions:  None   Problem List / ED Course:  Nausea, vomiting, abdominal pain Patient recently admitted to the hospital and discharged on Tuesday for similar complaint Patient states that a few times here she has these episodes of nausea, vomiting, and abdominal pain and that she is going to the hospital for nausea and pain control. Patient states that her symptoms continued after discharge at this time Patient states that she had 1 episode of vomiting yesterday with multiple episodes today approximately 4-5 On exam mild epigastric tenderness of abdomen, exam otherwise benign Labs ordered as well as fluids and nausea meds, pain medicine ordered CBC significant for mildly elevated white count, suspicious that this is due to vomiting as well as elevated hemoglobin and hematocrit, patient denies fever/chills low suspicion for infectious etiology due to history and presentation CMP- PENDING Patient handed off to Jerrol Morelle, PA-C at time of shift change, history, physical exam and workup were discussed, he understands and agrees to assume responsibility for patient care   Dispostion:  Patient handed off to [facility], presentation, history, physical exam, and work-up were discussed. He understands and agrees to assume responsibility for patient care.   Click here for ABCD2, HEART and other calculatorsREFRESH Note before signing :1}                              Medical Decision Making         Final Clinical Impression(s) / ED Diagnoses Final diagnoses:  None    Rx / DC Orders ED Discharge Orders     None         Susanne Epps 05/03/24 1528    Early Glisson, MD 05/04/24 1801
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
## 2024-05-03 NOTE — ED Provider Notes (Signed)
 Patient was signed out to myself at shift change.  Patient has been given multiple bouts of antiemetics in the emergency department and despite this continues to have ongoing nausea, vomiting, abdominal pain.  Patient was recently discharged in the hospital after having an extensive workup.  Do not suspect that repeat imaging is warranted at this time.  Patient's blood work does demonstrate hemoconcentration and suspect that she is dehydrated.  Did discuss patient case with Dr. Quintella Buck with the hospitalist service who has excepted for admission for intractable nausea and vomiting.   Roselynn Connors, PA-C 05/03/24 1717    Early Glisson, MD 05/04/24 [phone]

## 2024-05-04 DIAGNOSIS — R1110 Vomiting, unspecified: Secondary | ICD-10-CM

## 2024-05-04 DIAGNOSIS — F411 Generalized anxiety disorder: Secondary | ICD-10-CM | POA: Diagnosis present

## 2024-05-04 DIAGNOSIS — E119 Type 2 diabetes mellitus without complications: Secondary | ICD-10-CM | POA: Diagnosis present

## 2024-05-04 DIAGNOSIS — G8929 Other chronic pain: Secondary | ICD-10-CM | POA: Diagnosis present

## 2024-05-04 DIAGNOSIS — Z87820 Personal history of traumatic brain injury: Secondary | ICD-10-CM | POA: Diagnosis not present

## 2024-05-04 DIAGNOSIS — D72829 Elevated white blood cell count, unspecified: Secondary | ICD-10-CM | POA: Diagnosis present

## 2024-05-04 DIAGNOSIS — Z7951 Long term (current) use of inhaled steroids: Secondary | ICD-10-CM | POA: Diagnosis not present

## 2024-05-04 DIAGNOSIS — F17210 Nicotine dependence, cigarettes, uncomplicated: Secondary | ICD-10-CM | POA: Diagnosis present

## 2024-05-04 DIAGNOSIS — Z716 Tobacco abuse counseling: Secondary | ICD-10-CM | POA: Diagnosis not present

## 2024-05-04 DIAGNOSIS — I2510 Atherosclerotic heart disease of native coronary artery without angina pectoris: Secondary | ICD-10-CM | POA: Diagnosis present

## 2024-05-04 DIAGNOSIS — G40909 Epilepsy, unspecified, not intractable, without status epilepticus: Secondary | ICD-10-CM | POA: Diagnosis present

## 2024-05-04 DIAGNOSIS — I10 Essential (primary) hypertension: Secondary | ICD-10-CM | POA: Diagnosis present

## 2024-05-04 DIAGNOSIS — E860 Dehydration: Secondary | ICD-10-CM | POA: Diagnosis present

## 2024-05-04 DIAGNOSIS — Z79890 Hormone replacement therapy (postmenopausal): Secondary | ICD-10-CM | POA: Diagnosis not present

## 2024-05-04 DIAGNOSIS — E876 Hypokalemia: Secondary | ICD-10-CM | POA: Diagnosis present

## 2024-05-04 DIAGNOSIS — Z800 Family history of malignant neoplasm of digestive organs: Secondary | ICD-10-CM | POA: Diagnosis not present

## 2024-05-04 DIAGNOSIS — J439 Emphysema, unspecified: Secondary | ICD-10-CM | POA: Diagnosis present

## 2024-05-04 DIAGNOSIS — Z8249 Family history of ischemic heart disease and other diseases of the circulatory system: Secondary | ICD-10-CM | POA: Diagnosis not present

## 2024-05-04 DIAGNOSIS — E785 Hyperlipidemia, unspecified: Secondary | ICD-10-CM | POA: Diagnosis present

## 2024-05-04 DIAGNOSIS — E063 Autoimmune thyroiditis: Secondary | ICD-10-CM | POA: Diagnosis present

## 2024-05-04 DIAGNOSIS — R112 Nausea with vomiting, unspecified: Secondary | ICD-10-CM | POA: Diagnosis present

## 2024-05-04 DIAGNOSIS — E871 Hypo-osmolality and hyponatremia: Secondary | ICD-10-CM | POA: Diagnosis present

## 2024-05-04 DIAGNOSIS — Z90710 Acquired absence of both cervix and uterus: Secondary | ICD-10-CM | POA: Diagnosis not present

## 2024-05-04 LAB — CBC
HCT: 34.9 % — ABNORMAL LOW (ref 36.0–46.0)
Hemoglobin: 11.8 g/dL — ABNORMAL LOW (ref 12.0–15.0)
MCH: 34.7 pg — ABNORMAL HIGH (ref 26.0–34.0)
MCHC: 33.8 g/dL (ref 30.0–36.0)
MCV: 102.6 fL — ABNORMAL HIGH (ref 80.0–100.0)
Platelets: 242 10*3/uL (ref 150–400)
RBC: 3.4 MIL/uL — ABNORMAL LOW (ref 3.87–5.11)
RDW: 13.5 % (ref 11.5–15.5)
WBC: 13.2 10*3/uL — ABNORMAL HIGH (ref 4.0–10.5)
nRBC: 0 % (ref 0.0–0.2)

## 2024-05-04 LAB — BASIC METABOLIC PANEL WITH GFR
Anion gap: 5 (ref 5–15)
BUN: 9 mg/dL (ref 6–20)
CO2: 23 mmol/L (ref 22–32)
Calcium: 8.7 mg/dL — ABNORMAL LOW (ref 8.9–10.3)
Chloride: 107 mmol/L (ref 98–111)
Creatinine, Ser: 0.77 mg/dL (ref 0.44–1.00)
GFR, Estimated: >60 mL/min (ref >60)
Glucose, Bld: 100 mg/dL — ABNORMAL HIGH (ref 70–99)
Potassium: 2.8 mmol/L — ABNORMAL LOW (ref 3.5–5.1)
Sodium: 135 mmol/L (ref 135–145)

## 2024-05-04 LAB — GLUCOSE, CAPILLARY
Glucose-Capillary: 96 mg/dL (ref 70–99)
Glucose-Capillary: 93 mg/dL (ref 70–99)
Glucose-Capillary: 122 mg/dL — ABNORMAL HIGH (ref 70–99)
Glucose-Capillary: 92 mg/dL (ref 70–99)

## 2024-05-04 MED ORDER — POTASSIUM CHLORIDE CRYS ER 20 MEQ PO TBCR
40.0000 meq | EXTENDED_RELEASE_TABLET | Freq: Once | ORAL | Status: AC
  Administered 2024-05-04: 40 meq via ORAL
  Filled 2024-05-04: qty 2

## 2024-05-04 MED ORDER — MELATONIN 3 MG PO TABS
6.0000 mg | ORAL_TABLET | Freq: Every evening | ORAL | Status: DC | PRN
  Administered 2024-05-05 – 2024-05-06 (×3): 6 mg via ORAL
  Filled 2024-05-04 (×3): qty 2

## 2024-05-04 MED ORDER — POTASSIUM CHLORIDE IN NACL 40-0.9 MEQ/L-% IV SOLN: INTRAVENOUS | Status: DC

## 2024-05-04 NOTE — Progress Notes (Signed)
 Patient had no emesis this shift. Patient did c/o nausea and was medicated with zofran  which relieved the nausea.

## 2024-05-04 NOTE — Care Management Obs Status (Signed)
 MEDICARE OBSERVATION STATUS NOTIFICATION   Patient Details  Name: Melanie Shepard MRN: [id number] Date of Birth: Mar 08, 1966   Medicare Observation Status Notification Given: Yes    Lynda Sands, RN 05/04/2024, 7:16 AM

## 2024-05-04 NOTE — Plan of Care (Signed)
   Problem: Education: Goal: Knowledge of General Education information will improve Description: Including pain rating scale, medication(s)/side effects and non-pharmacologic comfort measures Outcome: Progressing   Problem: Health Behavior/Discharge Planning: Goal: Ability to manage health-related needs will improve Outcome: Progressing   Problem: Clinical Measurements: Goal: Diagnostic test results will improve Outcome: Progressing

## 2024-05-04 NOTE — Plan of Care (Signed)
  Problem: Education: Goal: Knowledge of General Education information will improve Description: Including pain rating scale, medication(s)/side effects and non-pharmacologic comfort measures Outcome: Progressing   Problem: Activity: Goal: Risk for activity intolerance will decrease Outcome: Progressing   Problem: Safety: Goal: Ability to remain free from injury will improve Outcome: Progressing   Problem: Skin Integrity: Goal: Risk for impaired skin integrity will decrease Outcome: Progressing   Problem: Education: Goal: Ability to describe self-care measures that may prevent or decrease complications (Diabetes Survival Skills Education) will improve Outcome: Progressing

## 2024-05-04 NOTE — Progress Notes (Signed)
 [facility] PROGRESS NOTE  Melanie Shepard (DOB: 1966/10/29) AOZ:[id number] PCP: [facility]  Brief Narrative: Melanie Shepard is a 58 y.o. female with medical history significant for diabetes mellitus, nonalcoholic liver disease, emphysema, seizure, anxiety, hypertension, migraines. Patient presented to ED with complaints of persistent vomiting and upper abdominal pain.  Has chronic abdominal pain with vomiting that occurs intermittently and resolves.   She reports similar occurrence of this pain that started about a week ago for which she was hospitalized 5/18 to 5/20-she was managed conservatively, she reports symptoms had never been that severe. She was not back to baseline but felt she would get better when she was at hospital, so 5/20 - she requested discharge from the hospital.  Symptoms have persisted and worsened since getting home.  She reports multiple episodes of vomiting, and poor oral intake.  No diarrhea.  Barely any sleep for 3 days.  Reports feeling delirious yesterday.   ED Course: Temperature 97.6.  Heart rate 79-111.  Respirate rate 19-20.  Blood pressure systolic 150s to 962X.  WBC 14.5. CT from recent hospitalization 5/18 was without acute abnormality, hence repeat imaging deferred. 2 Liter bolus given. Zofran , Compazine , Benadryl , droperidol  given, still with persistent vomiting.  Subjective: Still severely nauseated but no vomiting overnight. Frustrated with care overnight but otherwise appreciative of efforts. Feels she shouldn't have gone home earlier in the week.   Objective: BP 132/70 (BP Location: Right Arm)   Pulse 82   Temp 97.9 F (36.6 C)   Resp 16   Ht 5\' 7"  (1.702 m)   Wt 61.2 kg   SpO2 96%   BMI 21.14 kg/m   Gen: No distress Pulm: Clear, nonlabored  CV: RRR, no MRG GI: Soft, tender mostly in epigastrium without rebound or distention, +BS Neuro: Alert and oriented. No new focal deficits. Ext: Warm, no deformities. Skin: No  rashes, lesions or ulcers on visualized skin   Assessment & Plan: Intractable vomiting: Chronic, intermittent issue for patient. Has pursued significant work up in the past, had received referral for neurology evaluation for ?contribution of hx TBI. Recent CT abdomen and pelvis 5/18 negative for acute abnormality. EGD on 01/02/2024 with esophageal dilatation at the time (also had EGD previously November 2023 and back in 2021). Last colonoscopy in 2020 - Continue supportive care with IV fluids (modify based on metabolic panel), IV analgesics and IV antiemetics prn.   - Given recent work up and no red flags on exam at this time, will defer re-consultation with GI at this time.    Hypokalemia: Mg, phos wnl. - Supplement po if tolerates and add to IVF.  - Monitor closely  HTN: Not on home med - Continue monitoring with prn med only.   Diet-controlled T2DM: HbA1c 5.8%.  - SSI    Hypothyroidism: TSH 0.873. - Continue synthroid    Generalized anxiety disorder:  - Continue clonazepam  Wynetta Heckle, MD [facility] [email or web address] 05/04/2024, 12:17 PM

## 2024-05-04 NOTE — Progress Notes (Signed)
   05/04/24 0905  TOC Brief Assessment  Insurance and Status Reviewed  Patient has primary care physician Yes  Home environment has been reviewed Single Family Home  Prior level of function: Husband Assist  Prior/Current Home Services No current home services  Social Drivers of Health Review SDOH reviewed no interventions necessary  Readmission risk has been reviewed Yes  Transition of care needs no transition of care needs at this time   Observation Intractable vomiting. Transition of Care Department [facility]) has reviewed patient, and no TOC needs have been identified at this time. We will continue to monitor patient advancement through interdisciplinary progressions rounds. If new patient transition needs arise, please place a TOC consult.

## 2024-05-05 DIAGNOSIS — R1110 Vomiting, unspecified: Secondary | ICD-10-CM | POA: Diagnosis not present

## 2024-05-05 LAB — COMPREHENSIVE METABOLIC PANEL WITH GFR
ALT: 14 U/L (ref 0–44)
AST: 20 U/L (ref 15–41)
Albumin: 3.5 g/dL (ref 3.5–5.0)
Alkaline Phosphatase: 76 U/L (ref 38–126)
Anion gap: 5 (ref 5–15)
BUN: 5 mg/dL — ABNORMAL LOW (ref 6–20)
CO2: 20 mmol/L — ABNORMAL LOW (ref 22–32)
Calcium: 8.5 mg/dL — ABNORMAL LOW (ref 8.9–10.3)
Chloride: 109 mmol/L (ref 98–111)
Creatinine, Ser: 0.58 mg/dL (ref 0.44–1.00)
GFR, Estimated: >60 mL/min (ref >60)
Glucose, Bld: 95 mg/dL (ref 70–99)
Potassium: 3.8 mmol/L (ref 3.5–5.1)
Sodium: 134 mmol/L — ABNORMAL LOW (ref 135–145)
Total Bilirubin: 0.9 mg/dL (ref 0.0–1.2)
Total Protein: 6.4 g/dL — ABNORMAL LOW (ref 6.5–8.1)

## 2024-05-05 LAB — CBC WITH DIFFERENTIAL/PLATELET
Abs Immature Granulocytes: 0.05 10*3/uL (ref 0.00–0.07)
Basophils Absolute: 0.1 10*3/uL (ref 0.0–0.1)
Basophils Relative: 1 %
Eosinophils Absolute: 0.2 10*3/uL (ref 0.0–0.5)
Eosinophils Relative: 2 %
HCT: 36.2 % (ref 36.0–46.0)
Hemoglobin: 12.1 g/dL (ref 12.0–15.0)
Immature Granulocytes: 1 %
Lymphocytes Relative: 38 %
Lymphs Abs: 3.7 10*3/uL (ref 0.7–4.0)
MCH: 36.1 pg — ABNORMAL HIGH (ref 26.0–34.0)
MCHC: 33.4 g/dL (ref 30.0–36.0)
MCV: 108.1 fL — ABNORMAL HIGH (ref 80.0–100.0)
Monocytes Absolute: 0.5 10*3/uL (ref 0.1–1.0)
Monocytes Relative: 5 %
Neutro Abs: 5.3 10*3/uL (ref 1.7–7.7)
Neutrophils Relative %: 53 %
Platelets: 194 10*3/uL (ref 150–400)
RBC: 3.35 MIL/uL — ABNORMAL LOW (ref 3.87–5.11)
RDW: 13.3 % (ref 11.5–15.5)
WBC: 9.8 10*3/uL (ref 4.0–10.5)
nRBC: 0 % (ref 0.0–0.2)

## 2024-05-05 LAB — GLUCOSE, CAPILLARY
Glucose-Capillary: 94 mg/dL (ref 70–99)
Glucose-Capillary: 195 mg/dL — ABNORMAL HIGH (ref 70–99)
Glucose-Capillary: 148 mg/dL — ABNORMAL HIGH (ref 70–99)

## 2024-05-05 LAB — MAGNESIUM: Magnesium: 1.7 mg/dL (ref 1.7–2.4)

## 2024-05-05 MED ORDER — MAGNESIUM SULFATE IN D5W 1-5 GM/100ML-% IV SOLN
1.0000 g | Freq: Once | INTRAVENOUS | Status: AC
  Administered 2024-05-05: 1 g via INTRAVENOUS
  Filled 2024-05-05: qty 100

## 2024-05-05 MED ORDER — POTASSIUM CHLORIDE IN NACL 40-0.9 MEQ/L-% IV SOLN: INTRAVENOUS | Status: DC

## 2024-05-05 NOTE — Plan of Care (Signed)

## 2024-05-05 NOTE — Progress Notes (Addendum)
 [facility] PROGRESS NOTE  Melanie Shepard (DOB: 1966/06/18) ZOX:[id number] PCP: [facility]  Brief Narrative: Melanie Shepard is a 58 y.o. female with medical history significant for diabetes mellitus, nonalcoholic liver disease, emphysema, seizure, anxiety, hypertension, migraines. Patient presented to ED with complaints of persistent vomiting and upper abdominal pain.  Has chronic abdominal pain with vomiting that occurs intermittently and resolves.   She reports similar occurrence of this pain that started about a week ago for which she was hospitalized 5/18 to 5/20-she was managed conservatively, she reports symptoms had never been that severe. She was not back to baseline but felt she would get better when she was at hospital, so 5/20 - she requested discharge from the hospital.  Symptoms have persisted and worsened since getting home.  She reports multiple episodes of vomiting, and poor oral intake.  No diarrhea.  Barely any sleep for 3 days.  Reports feeling delirious yesterday.   ED Course: Temperature 97.6.  Heart rate 79-111.  Respirate rate 19-20.  Blood pressure systolic 150s to 811B.  WBC 14.5. CT from recent hospitalization 5/18 was without acute abnormality, hence repeat imaging deferred. 2 Liter bolus given. Zofran , Compazine , Benadryl , droperidol  given, still with persistent vomiting.  Subjective: After rough night 2 nights ago, her night last night was better, got some sleep. Feels better, but still nauseated and is past due for pain medications. Tolerated a little water  but no much else. Will attempt to advance diet today.   Objective: BP 105/63 (BP Location: Left Arm)   Pulse 82   Temp 98.1 F (36.7 C) (Oral)   Resp 16   Ht 5\' 7"  (1.702 m)   Wt 61.2 kg   SpO2 96%   BMI 21.14 kg/m   Gen: No distress Pulm: Clear, nonlabored  CV: RRR, no MRG or edema GI: Soft, modestly tender in epigastrium without rebound, ND, hypoactive BS.  Neuro: Alert and  oriented. No new focal deficits. Ext: Warm, no deformities Skin: No rashes, lesions or ulcers on visualized skin   Assessment & Plan: Intractable vomiting: Chronic, intermittent issue for patient. Has pursued significant work up in the past, had received referral for neurology evaluation for ?contribution of hx TBI. Recent CT abdomen and pelvis 5/18 negative for acute abnormality. EGD on 01/02/2024 with esophageal dilatation at the time (also had EGD previously November 2023 and back in 2021). Last colonoscopy in 2020 - Continue supportive care with IV fluid, IV analgesics and IV antiemetics prn.  Improving symptoms but remains dependent on these therapies for now. - Given recent work up and no red flags on exam at this time, will defer re-consultation with GI at this time.    Hypokalemia: Mg, phos wnl. - Supplemented and improved. Given poor oral intake, ongoing GI losses, will continue in IVF today, supp 1g Mag with low normal lab.  - Monitor closely  HTN: Not on home med - Continue monitoring with prn med only.   Diet-controlled T2DM: HbA1c 5.8%.  - SSI    Hypothyroidism: TSH 0.873. - Continue synthroid    Generalized anxiety disorder:  - Continue clonazepam  Leukocytosis: Remains afebrile. WBC normalized without antimicrobial Tx suggestive of reactive, not infectious, process.  - Monitor off abx for now.  Wynetta Heckle, MD [facility] [email or web address] 05/05/2024, 7:36 AM

## 2024-05-06 DIAGNOSIS — R1110 Vomiting, unspecified: Secondary | ICD-10-CM | POA: Diagnosis not present

## 2024-05-06 LAB — BASIC METABOLIC PANEL WITH GFR
Anion gap: 4 — ABNORMAL LOW (ref 5–15)
BUN: 5 mg/dL — ABNORMAL LOW (ref 6–20)
CO2: 22 mmol/L (ref 22–32)
Calcium: 8.3 mg/dL — ABNORMAL LOW (ref 8.9–10.3)
Chloride: 108 mmol/L (ref 98–111)
Creatinine, Ser: 0.62 mg/dL (ref 0.44–1.00)
GFR, Estimated: >60 mL/min (ref >60)
Glucose, Bld: 110 mg/dL — ABNORMAL HIGH (ref 70–99)
Potassium: 3.4 mmol/L — ABNORMAL LOW (ref 3.5–5.1)
Sodium: 134 mmol/L — ABNORMAL LOW (ref 135–145)

## 2024-05-06 LAB — GLUCOSE, CAPILLARY
Glucose-Capillary: 122 mg/dL — ABNORMAL HIGH (ref 70–99)
Glucose-Capillary: 126 mg/dL — ABNORMAL HIGH (ref 70–99)

## 2024-05-06 MED ORDER — ENSURE ENLIVE PO LIQD
237.0000 mL | Freq: Two times a day (BID) | ORAL | Status: DC
  Administered 2024-05-07: 237 mL via ORAL

## 2024-05-06 MED ORDER — POTASSIUM CHLORIDE CRYS ER 20 MEQ PO TBCR
40.0000 meq | EXTENDED_RELEASE_TABLET | Freq: Once | ORAL | Status: AC
  Administered 2024-05-06: 40 meq via ORAL
  Filled 2024-05-06: qty 2

## 2024-05-06 MED ORDER — INSULIN ASPART 100 UNIT/ML IJ SOLN
0.0000 [IU] | Freq: Three times a day (TID) | INTRAMUSCULAR | Status: DC
  Administered 2024-05-06: 1 [IU] via SUBCUTANEOUS
  Administered 2024-05-07: 2 [IU] via SUBCUTANEOUS

## 2024-05-06 MED ORDER — POTASSIUM CHLORIDE IN NACL 40-0.9 MEQ/L-% IV SOLN: INTRAVENOUS | Status: AC

## 2024-05-06 NOTE — Plan of Care (Signed)
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
## 2024-05-07 DIAGNOSIS — R1110 Vomiting, unspecified: Secondary | ICD-10-CM | POA: Diagnosis not present

## 2024-05-07 LAB — BASIC METABOLIC PANEL WITH GFR
Anion gap: 6 (ref 5–15)
BUN: <5 mg/dL — ABNORMAL LOW (ref 6–20)
CO2: 24 mmol/L (ref 22–32)
Calcium: 9.2 mg/dL (ref 8.9–10.3)
Chloride: 108 mmol/L (ref 98–111)
Creatinine, Ser: 0.66 mg/dL (ref 0.44–1.00)
GFR, Estimated: >60 mL/min (ref >60)
Glucose, Bld: 130 mg/dL — ABNORMAL HIGH (ref 70–99)
Potassium: 4.1 mmol/L (ref 3.5–5.1)
Sodium: 138 mmol/L (ref 135–145)

## 2024-05-07 LAB — GLUCOSE, CAPILLARY
Glucose-Capillary: 106 mg/dL — ABNORMAL HIGH (ref 70–99)
Glucose-Capillary: 115 mg/dL — ABNORMAL HIGH (ref 70–99)
Glucose-Capillary: 175 mg/dL — ABNORMAL HIGH (ref 70–99)

## 2024-05-07 LAB — MAGNESIUM: Magnesium: 1.6 mg/dL — ABNORMAL LOW (ref 1.7–2.4)

## 2024-05-07 MED ORDER — POLYETHYLENE GLYCOL 3350 17 G PO PACK: 17.0000 g | PACK | Freq: Two times a day (BID) | ORAL | 3 refills | Status: AC

## 2024-05-07 MED ORDER — MELATONIN 3 MG PO TABS: 6.0000 mg | ORAL_TABLET | Freq: Every evening | ORAL | 2 refills | Status: AC | PRN

## 2024-05-07 MED ORDER — SENNOSIDES-DOCUSATE SODIUM 8.6-50 MG PO TABS: 2.0000 | ORAL_TABLET | Freq: Every day | ORAL | 1 refills | Status: AC

## 2024-05-07 MED ORDER — POLYETHYLENE GLYCOL 3350 17 G PO PACK
17.0000 g | PACK | Freq: Two times a day (BID) | ORAL | Status: DC
  Filled 2024-05-07: qty 1

## 2024-05-07 MED ORDER — OXYCODONE HCL 5 MG PO TABS
5.0000 mg | ORAL_TABLET | ORAL | Status: DC | PRN
  Administered 2024-05-07 (×2): 5 mg via ORAL
  Filled 2024-05-07 (×2): qty 1

## 2024-05-07 MED ORDER — MAGNESIUM SULFATE 2 GM/50ML IV SOLN
2.0000 g | Freq: Once | INTRAVENOUS | Status: AC
  Administered 2024-05-07: 2 g via INTRAVENOUS
  Filled 2024-05-07: qty 50

## 2024-05-07 MED ORDER — SENNOSIDES-DOCUSATE SODIUM 8.6-50 MG PO TABS: 2.0000 | ORAL_TABLET | Freq: Every day | ORAL | Status: DC

## 2024-05-07 MED ORDER — PANTOPRAZOLE SODIUM 40 MG PO TBEC
40.0000 mg | DELAYED_RELEASE_TABLET | Freq: Every day | ORAL | Status: DC
  Administered 2024-05-07: 40 mg via ORAL
  Filled 2024-05-07: qty 1
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
## 2024-05-07 NOTE — Discharge Instructions (Signed)
 1) maintain adequate hydration--soft diet advised 2) avoid constipation----May use over-the-counter laxatives or lactulose  as prescribed 3) okay to use nicotine  patch to help you quit smoking 4) follow-up with gastroenterology as outpatient--as previously advised

## 2024-05-07 NOTE — Plan of Care (Signed)

## 2024-05-07 NOTE — Plan of Care (Signed)
  Problem: Clinical Measurements: Goal: Ability to maintain clinical measurements within normal limits will improve Outcome: Progressing Goal: Will remain free from infection Outcome: Progressing Goal: Respiratory complications will improve Outcome: Progressing   Problem: Elimination: Goal: Will not experience complications related to urinary retention Outcome: Progressing   Problem: Pain Managment: Goal: General experience of comfort will improve and/or be controlled Outcome: Progressing

## 2024-05-07 NOTE — Care Management Important Message (Signed)
 Important Message  Patient Details  Name: Melanie Shepard MRN: [id number] Date of Birth: [birth year]   Important Message Given:  Yes - Medicare IM     Na Waldrip L Chrisie Jankovich 05/07/2024, 11:32 AM

## 2024-05-14 ENCOUNTER — Ambulatory Visit: Admitting: Gastroenterology

## 2024-05-30 ENCOUNTER — Other Ambulatory Visit: Payer: Self-pay | Admitting: Gastroenterology

## 2024-05-30 MED ORDER — PROMETHAZINE HCL 25 MG PO TABS: 12.5000 mg | ORAL_TABLET | ORAL | 1 refills | Status: DC | PRN

## 2024-06-06 ENCOUNTER — Encounter: Payer: Self-pay | Admitting: Diagnostic Neuroimaging

## 2024-06-06 ENCOUNTER — Ambulatory Visit: Payer: Self-pay | Admitting: Diagnostic Neuroimaging

## 2024-06-06 VITALS — BP 102/70 | HR 103 | Ht 67.0 in | Wt 131.8 lb

## 2024-06-06 DIAGNOSIS — G43A0 Cyclical vomiting, not intractable: Secondary | ICD-10-CM | POA: Diagnosis not present

## 2024-06-06 DIAGNOSIS — I6783 Posterior reversible encephalopathy syndrome: Secondary | ICD-10-CM | POA: Diagnosis not present

## 2024-06-06 MED ORDER — AMITRIPTYLINE HCL 25 MG PO TABS: 12.5000 mg | ORAL_TABLET | Freq: Every day | ORAL | 6 refills | Status: DC
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
## 2024-06-09 ENCOUNTER — Telehealth: Payer: Self-pay | Admitting: Diagnostic Neuroimaging

## 2024-06-09 NOTE — Telephone Encounter (Signed)
 GI is on the order but they are out of network with her insurance. BCBS medicare auth: [id number] exp. 06/09/24-09/06/24 sent to [facility]  [facility] [address] [place] , [place] [address] Phone: [phone] Fax: [phone] NPI: [id number] TIN: [id number]

## 2024-06-27 ENCOUNTER — Telehealth: Payer: Self-pay | Admitting: Neurology

## 2024-06-27 NOTE — Telephone Encounter (Signed)
 Received the result impression report from where the pt completed MRI brain w/out contrast through [facility] health.   Will send to Dr Margaret to review for the pt and result.

## 2024-08-16 MED ORDER — TOPIRAMATE 50 MG PO TABS
50.0000 mg | ORAL_TABLET | Freq: Two times a day (BID) | ORAL | 12 refills | Status: AC
Start: 2024-08-16 — End: ?

## 2024-08-16 NOTE — Addendum Note (Signed)
 Addended by: MARGARET CARNE R on: 08/16/2024 04:42 PM   Modules accepted: Orders

## 2024-08-18 NOTE — Telephone Encounter (Signed)
 Perfect! You may have already picked it up and noticed that the bottle says 50 mg twice a day. Just remember to wean up as Dr Margaret suggested.  topiramate  50mg  at bedtime. After 1-2 weeks, may increase up to 50mg  twice a day. Please drink a glass of water  with each dose  This will help your body get used to it a little better.
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
## 2024-08-23 ENCOUNTER — Emergency Department (HOSPITAL_COMMUNITY)
Admission: EM | Admit: 2024-08-23 | Discharge: 2024-08-24 | Disposition: A | Discharge status: Home / Self Care | Attending: Emergency Medicine | Admitting: Emergency Medicine

## 2024-08-23 ENCOUNTER — Encounter (HOSPITAL_COMMUNITY): Payer: Self-pay

## 2024-08-23 ENCOUNTER — Emergency Department (HOSPITAL_COMMUNITY)

## 2024-08-23 ENCOUNTER — Other Ambulatory Visit: Payer: Self-pay

## 2024-08-23 DIAGNOSIS — I10 Essential (primary) hypertension: Secondary | ICD-10-CM | POA: Insufficient documentation

## 2024-08-23 DIAGNOSIS — R112 Nausea with vomiting, unspecified: Secondary | ICD-10-CM

## 2024-08-23 DIAGNOSIS — Z7951 Long term (current) use of inhaled steroids: Secondary | ICD-10-CM | POA: Diagnosis not present

## 2024-08-23 DIAGNOSIS — R1110 Vomiting, unspecified: Secondary | ICD-10-CM | POA: Insufficient documentation

## 2024-08-23 DIAGNOSIS — J449 Chronic obstructive pulmonary disease, unspecified: Secondary | ICD-10-CM | POA: Diagnosis not present

## 2024-08-23 DIAGNOSIS — R109 Unspecified abdominal pain: Secondary | ICD-10-CM | POA: Insufficient documentation

## 2024-08-23 DIAGNOSIS — E119 Type 2 diabetes mellitus without complications: Secondary | ICD-10-CM | POA: Diagnosis not present

## 2024-08-23 DIAGNOSIS — R519 Headache, unspecified: Secondary | ICD-10-CM | POA: Insufficient documentation

## 2024-08-23 LAB — COMPREHENSIVE METABOLIC PANEL WITH GFR
ALT: 33 U/L (ref 0–44)
AST: 39 U/L (ref 15–41)
Albumin: 4.1 g/dL (ref 3.5–5.0)
Alkaline Phosphatase: 122 U/L (ref 38–126)
Anion gap: 13 (ref 5–15)
BUN: 8 mg/dL (ref 6–20)
CO2: 25 mmol/L (ref 22–32)
Calcium: 9.2 mg/dL (ref 8.9–10.3)
Chloride: 98 mmol/L (ref 98–111)
Creatinine, Ser: 0.83 mg/dL (ref 0.44–1.00)
GFR, Estimated: >60 mL/min (ref >60)
Glucose, Bld: 171 mg/dL — ABNORMAL HIGH (ref 70–99)
Potassium: 3.6 mmol/L (ref 3.5–5.1)
Sodium: 136 mmol/L (ref 135–145)
Total Bilirubin: 0.8 mg/dL (ref 0.0–1.2)
Total Protein: 8 g/dL (ref 6.5–8.1)

## 2024-08-23 LAB — CBC
HCT: 41.8 % (ref 36.0–46.0)
Hemoglobin: 13.6 g/dL (ref 12.0–15.0)
MCH: 34.2 pg — ABNORMAL HIGH (ref 26.0–34.0)
MCHC: 32.5 g/dL (ref 30.0–36.0)
MCV: 105 fL — ABNORMAL HIGH (ref 80.0–100.0)
Platelets: 253 K/uL (ref 150–400)
RBC: 3.98 MIL/uL (ref 3.87–5.11)
RDW: 14.7 % (ref 11.5–15.5)
WBC: 15.2 K/uL — ABNORMAL HIGH (ref 4.0–10.5)
nRBC: 0 % (ref 0.0–0.2)

## 2024-08-23 LAB — LIPASE, BLOOD: Lipase: 32 U/L (ref 11–51)

## 2024-08-23 MED ORDER — KETOROLAC TROMETHAMINE 30 MG/ML IJ SOLN
30.0000 mg | Freq: Once | INTRAMUSCULAR | Status: AC
  Administered 2024-08-23: 30 mg via INTRAVENOUS
  Filled 2024-08-23: qty 1

## 2024-08-23 MED ORDER — DIPHENHYDRAMINE HCL 50 MG/ML IJ SOLN
50.0000 mg | Freq: Once | INTRAMUSCULAR | Status: AC
  Administered 2024-08-23: 50 mg via INTRAVENOUS
  Filled 2024-08-23: qty 1

## 2024-08-23 MED ORDER — HYDROMORPHONE HCL 1 MG/ML IJ SOLN: 0.5000 mg | Freq: Once | INTRAMUSCULAR | Status: DC

## 2024-08-23 MED ORDER — PROCHLORPERAZINE EDISYLATE 10 MG/2ML IJ SOLN
10.0000 mg | Freq: Once | INTRAMUSCULAR | Status: AC
  Administered 2024-08-23: 10 mg via INTRAVENOUS
  Filled 2024-08-23: qty 2

## 2024-08-23 MED ORDER — ONDANSETRON HCL 4 MG/2ML IJ SOLN
4.0000 mg | Freq: Once | INTRAMUSCULAR | Status: AC
  Administered 2024-08-23: 4 mg via INTRAVENOUS
  Filled 2024-08-23: qty 2

## 2024-08-23 MED ORDER — HYDROMORPHONE HCL 1 MG/ML IJ SOLN
0.5000 mg | Freq: Once | INTRAMUSCULAR | Status: AC
  Administered 2024-08-23: 0.5 mg via INTRAVENOUS
  Filled 2024-08-23: qty 0.5

## 2024-08-23 MED ORDER — LORAZEPAM 2 MG/ML IJ SOLN
0.5000 mg | Freq: Once | INTRAMUSCULAR | Status: AC
  Administered 2024-08-23: 0.5 mg via INTRAVENOUS
  Filled 2024-08-23: qty 1

## 2024-08-23 MED ORDER — PANTOPRAZOLE SODIUM 40 MG IV SOLR
40.0000 mg | Freq: Once | INTRAVENOUS | Status: AC
  Administered 2024-08-23: 40 mg via INTRAVENOUS
  Filled 2024-08-23: qty 10

## 2024-08-23 MED ORDER — SODIUM CHLORIDE 0.9 % IV BOLUS
2000.0000 mL | Freq: Once | INTRAVENOUS | Status: AC
Start: 2024-08-23 — End: 2024-08-24
  Administered 2024-08-23: 2000 mL via INTRAVENOUS
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
## 2024-08-23 NOTE — ED Triage Notes (Signed)
 Pt stated that she is here for the same as she always is. Pt complaining of headache, emesis and abd pain.

## 2024-08-24 NOTE — Discharge Instructions (Signed)
 Your test results today are reassuring.  Return to the emergency department for any new or worsening symptoms of concern.

## 2024-08-24 NOTE — ED Provider Notes (Signed)
 Care of patient seen from Dr. Zammit.  Patient presenting with headache and emesis.  Workup has been reassuring.  She has been ordered headache cocktail.  She will require reassessment. Physical Exam  BP (!) 90/56   Pulse 90   Temp (!) 97 F (36.1 C)   Resp 17   Ht 5' 7 (1.702 m)   Wt 59 kg   SpO2 98%   BMI 20.36 kg/m   Physical Exam Vitals and nursing note reviewed.  Constitutional:      General: She is not in acute distress.    Appearance: She is well-developed. She is not ill-appearing, toxic-appearing or diaphoretic.  HENT:     Head: Normocephalic and atraumatic.     Mouth/Throat:     Mouth: Mucous membranes are moist.  Eyes:     Extraocular Movements: Extraocular movements intact.     Conjunctiva/sclera: Conjunctivae normal.  Cardiovascular:     Rate and Rhythm: Normal rate and regular rhythm.  Pulmonary:     Effort: Pulmonary effort is normal. No respiratory distress.  Abdominal:     General: There is no distension.  Musculoskeletal:        General: Normal range of motion.     Cervical back: Neck supple.  Skin:    General: Skin is warm and dry.     Capillary Refill: Capillary refill takes less than 2 seconds.  Neurological:     Mental Status: She is alert and oriented to person, place, and time.  Psychiatric:        Mood and Affect: Mood normal.        Behavior: Behavior normal.     Procedures  Procedures  ED Course / MDM    Medical Decision Making Amount and/or Complexity of Data Reviewed Labs: ordered. Radiology: ordered.  Risk Prescription drug management.   On assessment, patient resting comfortably.  She has been able to drink [facility] while in the ED.  She states that she is feeling better.  She was discharged in stable condition.       Melvenia Motto, MD 08/24/24 0001

## 2024-09-08 ENCOUNTER — Other Ambulatory Visit: Payer: Self-pay | Admitting: Gastroenterology

## 2024-09-24 ENCOUNTER — Encounter (INDEPENDENT_AMBULATORY_CARE_PROVIDER_SITE_OTHER): Payer: Self-pay | Admitting: Gastroenterology

## 2024-10-03 ENCOUNTER — Other Ambulatory Visit: Payer: Self-pay | Admitting: Gastroenterology

## 2024-10-11 DIAGNOSIS — J439 Emphysema, unspecified: Secondary | ICD-10-CM

## 2024-10-11 HISTORY — DX: Emphysema, unspecified: J43.9

## 2024-10-15 ENCOUNTER — Encounter (INDEPENDENT_AMBULATORY_CARE_PROVIDER_SITE_OTHER): Payer: Self-pay | Admitting: *Deleted

## 2024-12-15 ENCOUNTER — Other Ambulatory Visit: Payer: Self-pay | Admitting: Gastroenterology

## 2024-12-18 ENCOUNTER — Other Ambulatory Visit: Payer: Self-pay

## 2024-12-18 ENCOUNTER — Encounter (HOSPITAL_COMMUNITY): Payer: Self-pay

## 2024-12-18 ENCOUNTER — Emergency Department (HOSPITAL_COMMUNITY)

## 2024-12-18 ENCOUNTER — Inpatient Hospital Stay (HOSPITAL_COMMUNITY)
Admission: EM | Admit: 2024-12-18 | Discharge: 2024-12-21 | DRG: 641 | Disposition: A | Discharge status: Home / Self Care | Attending: Hospitalist | Admitting: Hospitalist

## 2024-12-18 DIAGNOSIS — F411 Generalized anxiety disorder: Secondary | ICD-10-CM | POA: Diagnosis present

## 2024-12-18 DIAGNOSIS — Z79899 Other long term (current) drug therapy: Secondary | ICD-10-CM

## 2024-12-18 DIAGNOSIS — E872 Acidosis, unspecified: Secondary | ICD-10-CM | POA: Diagnosis not present

## 2024-12-18 DIAGNOSIS — Z79890 Hormone replacement therapy (postmenopausal): Secondary | ICD-10-CM

## 2024-12-18 DIAGNOSIS — Z603 Acculturation difficulty: Secondary | ICD-10-CM | POA: Diagnosis present

## 2024-12-18 DIAGNOSIS — E119 Type 2 diabetes mellitus without complications: Secondary | ICD-10-CM

## 2024-12-18 DIAGNOSIS — E860 Dehydration: Principal | ICD-10-CM | POA: Diagnosis present

## 2024-12-18 DIAGNOSIS — Z888 Allergy status to other drugs, medicaments and biological substances status: Secondary | ICD-10-CM

## 2024-12-18 DIAGNOSIS — Z91041 Radiographic dye allergy status: Secondary | ICD-10-CM

## 2024-12-18 DIAGNOSIS — F17210 Nicotine dependence, cigarettes, uncomplicated: Secondary | ICD-10-CM | POA: Diagnosis present

## 2024-12-18 DIAGNOSIS — K2100 Gastro-esophageal reflux disease with esophagitis, without bleeding: Secondary | ICD-10-CM | POA: Diagnosis present

## 2024-12-18 DIAGNOSIS — D72829 Elevated white blood cell count, unspecified: Secondary | ICD-10-CM | POA: Diagnosis present

## 2024-12-18 DIAGNOSIS — E039 Hypothyroidism, unspecified: Secondary | ICD-10-CM | POA: Diagnosis present

## 2024-12-18 DIAGNOSIS — R112 Nausea with vomiting, unspecified: Secondary | ICD-10-CM | POA: Diagnosis present

## 2024-12-18 DIAGNOSIS — E1165 Type 2 diabetes mellitus with hyperglycemia: Secondary | ICD-10-CM | POA: Diagnosis present

## 2024-12-18 DIAGNOSIS — R160 Hepatomegaly, not elsewhere classified: Secondary | ICD-10-CM | POA: Diagnosis present

## 2024-12-18 DIAGNOSIS — R109 Unspecified abdominal pain: Secondary | ICD-10-CM | POA: Diagnosis present

## 2024-12-18 DIAGNOSIS — Z87820 Personal history of traumatic brain injury: Secondary | ICD-10-CM

## 2024-12-18 DIAGNOSIS — E782 Mixed hyperlipidemia: Secondary | ICD-10-CM | POA: Diagnosis present

## 2024-12-18 DIAGNOSIS — R1110 Vomiting, unspecified: Principal | ICD-10-CM

## 2024-12-18 DIAGNOSIS — Z7951 Long term (current) use of inhaled steroids: Secondary | ICD-10-CM

## 2024-12-18 DIAGNOSIS — I10 Essential (primary) hypertension: Secondary | ICD-10-CM | POA: Diagnosis present

## 2024-12-18 DIAGNOSIS — Z91048 Other nonmedicinal substance allergy status: Secondary | ICD-10-CM

## 2024-12-18 DIAGNOSIS — I2510 Atherosclerotic heart disease of native coronary artery without angina pectoris: Secondary | ICD-10-CM | POA: Diagnosis present

## 2024-12-18 DIAGNOSIS — E063 Autoimmune thyroiditis: Secondary | ICD-10-CM | POA: Diagnosis present

## 2024-12-18 DIAGNOSIS — K760 Fatty (change of) liver, not elsewhere classified: Secondary | ICD-10-CM | POA: Diagnosis present

## 2024-12-18 DIAGNOSIS — J439 Emphysema, unspecified: Secondary | ICD-10-CM | POA: Diagnosis present

## 2024-12-18 DIAGNOSIS — K219 Gastro-esophageal reflux disease without esophagitis: Secondary | ICD-10-CM | POA: Diagnosis present

## 2024-12-18 DIAGNOSIS — Z8249 Family history of ischemic heart disease and other diseases of the circulatory system: Secondary | ICD-10-CM

## 2024-12-18 HISTORY — DX: Unspecified intracranial injury with loss of consciousness status unknown, initial encounter: S06.9XAA

## 2024-12-18 LAB — CBC
HCT: 44.5 % (ref 36.0–46.0)
Hemoglobin: 14.8 g/dL (ref 12.0–15.0)
MCH: 33.4 pg (ref 26.0–34.0)
MCHC: 33.3 g/dL (ref 30.0–36.0)
MCV: 100.5 fL — ABNORMAL HIGH (ref 80.0–100.0)
Platelets: 305 K/uL (ref 150–400)
RBC: 4.43 MIL/uL (ref 3.87–5.11)
RDW: 13.9 % (ref 11.5–15.5)
WBC: 16.2 K/uL — ABNORMAL HIGH (ref 4.0–10.5)
nRBC: 0 % (ref 0.0–0.2)

## 2024-12-18 LAB — COMPREHENSIVE METABOLIC PANEL WITH GFR
ALT: 26 U/L (ref 0–44)
AST: 56 U/L — ABNORMAL HIGH (ref 15–41)
Albumin: 5 g/dL (ref 3.5–5.0)
Alkaline Phosphatase: 125 U/L (ref 38–126)
Anion gap: 21 — ABNORMAL HIGH (ref 5–15)
BUN: 9 mg/dL (ref 6–20)
CO2: 16 mmol/L — ABNORMAL LOW (ref 22–32)
Calcium: 9.8 mg/dL (ref 8.9–10.3)
Chloride: 93 mmol/L — ABNORMAL LOW (ref 98–111)
Creatinine, Ser: 0.87 mg/dL (ref 0.44–1.00)
GFR, Estimated: >60 mL/min
Glucose, Bld: 178 mg/dL — ABNORMAL HIGH (ref 70–99)
Potassium: 3.9 mmol/L (ref 3.5–5.1)
Sodium: 131 mmol/L — ABNORMAL LOW (ref 135–145)
Total Bilirubin: 0.6 mg/dL (ref 0.0–1.2)
Total Protein: 8.5 g/dL — ABNORMAL HIGH (ref 6.5–8.1)

## 2024-12-18 LAB — URINALYSIS, ROUTINE W REFLEX MICROSCOPIC
Bilirubin Urine: NEGATIVE
Glucose, UA: NEGATIVE mg/dL
Hgb urine dipstick: NEGATIVE
Ketones, ur: NEGATIVE mg/dL
Leukocytes,Ua: NEGATIVE
Nitrite: NEGATIVE
Protein, ur: NEGATIVE mg/dL
Specific Gravity, Urine: 1.015 (ref 1.005–1.030)
pH: 6 (ref 5.0–8.0)

## 2024-12-18 LAB — LIPASE, BLOOD: Lipase: 46 U/L (ref 11–51)

## 2024-12-18 MED ORDER — POLYETHYLENE GLYCOL 3350 17 G PO PACK
17.0000 g | PACK | Freq: Two times a day (BID) | ORAL | Status: DC
  Administered 2024-12-19: 17 g via ORAL
  Filled 2024-12-18 (×5): qty 1

## 2024-12-18 MED ORDER — PANTOPRAZOLE SODIUM 40 MG PO TBEC
40.0000 mg | DELAYED_RELEASE_TABLET | Freq: Two times a day (BID) | ORAL | Status: DC
  Administered 2024-12-19 – 2024-12-21 (×5): 40 mg via ORAL
  Filled 2024-12-18 (×6): qty 1

## 2024-12-18 MED ORDER — LORAZEPAM 2 MG/ML IJ SOLN
0.5000 mg | Freq: Once | INTRAMUSCULAR | Status: AC
  Administered 2024-12-18: 0.5 mg via INTRAVENOUS
  Filled 2024-12-18: qty 1

## 2024-12-18 MED ORDER — ONDANSETRON HCL 4 MG/2ML IJ SOLN
4.0000 mg | Freq: Once | INTRAMUSCULAR | Status: AC
  Administered 2024-12-18: 4 mg via INTRAVENOUS
  Filled 2024-12-18: qty 2

## 2024-12-18 MED ORDER — HYDROMORPHONE HCL 1 MG/ML IJ SOLN
1.0000 mg | Freq: Once | INTRAMUSCULAR | Status: AC
  Administered 2024-12-18: 1 mg via INTRAVENOUS
  Filled 2024-12-18: qty 1

## 2024-12-18 MED ORDER — PANTOPRAZOLE SODIUM 40 MG IV SOLR
40.0000 mg | Freq: Once | INTRAVENOUS | Status: AC
  Administered 2024-12-18: 40 mg via INTRAVENOUS
  Filled 2024-12-18: qty 10

## 2024-12-18 MED ORDER — SODIUM CHLORIDE 0.9 % IV SOLN: INTRAVENOUS | Status: AC

## 2024-12-18 MED ORDER — ONDANSETRON 4 MG PO TBDP
8.0000 mg | ORAL_TABLET | Freq: Three times a day (TID) | ORAL | Status: DC | PRN
  Administered 2024-12-19: 8 mg via ORAL
  Filled 2024-12-18: qty 1

## 2024-12-18 MED ORDER — TIZANIDINE HCL 4 MG PO TABS: 4.0000 mg | ORAL_TABLET | Freq: Four times a day (QID) | ORAL | Status: DC | PRN

## 2024-12-18 MED ORDER — HYDROMORPHONE HCL 1 MG/ML IJ SOLN
1.0000 mg | INTRAMUSCULAR | Status: DC | PRN
  Administered 2024-12-18 – 2024-12-21 (×25): 1 mg via INTRAVENOUS
  Filled 2024-12-18 (×26): qty 1

## 2024-12-18 MED ORDER — SODIUM CHLORIDE 0.9 % IV BOLUS
1000.0000 mL | Freq: Once | INTRAVENOUS | Status: AC
  Administered 2024-12-18: 1000 mL via INTRAVENOUS

## 2024-12-18 MED ORDER — LEVOTHYROXINE SODIUM 137 MCG PO TABS
137.0000 ug | ORAL_TABLET | Freq: Every day | ORAL | Status: DC
  Administered 2024-12-19 – 2024-12-21 (×3): 137 ug via ORAL
  Filled 2024-12-18 (×3): qty 1

## 2024-12-18 MED ORDER — ALPRAZOLAM 0.5 MG PO TABS: 0.5000 mg | ORAL_TABLET | Freq: Every evening | ORAL | Status: DC | PRN

## 2024-12-18 MED ORDER — FLUTICASONE FUROATE-VILANTEROL 200-25 MCG/ACT IN AEPB
1.0000 | INHALATION_SPRAY | Freq: Every day | RESPIRATORY_TRACT | Status: DC
  Administered 2024-12-19 – 2024-12-20 (×2): 1 via RESPIRATORY_TRACT
  Filled 2024-12-18 (×2): qty 28

## 2024-12-18 MED ORDER — SENNOSIDES-DOCUSATE SODIUM 8.6-50 MG PO TABS
2.0000 | ORAL_TABLET | Freq: Every day | ORAL | Status: DC
  Administered 2024-12-19: 2 via ORAL
  Filled 2024-12-18 (×2): qty 2

## 2024-12-18 MED ORDER — PROCHLORPERAZINE EDISYLATE 10 MG/2ML IJ SOLN
10.0000 mg | Freq: Once | INTRAMUSCULAR | Status: AC
  Administered 2024-12-18: 10 mg via INTRAVENOUS
  Filled 2024-12-18: qty 2

## 2024-12-18 MED ORDER — CLONAZEPAM 0.5 MG PO TABS
1.0000 mg | ORAL_TABLET | Freq: Two times a day (BID) | ORAL | Status: DC
  Administered 2024-12-18 – 2024-12-21 (×5): 1 mg via ORAL
  Filled 2024-12-18 (×6): qty 2

## 2024-12-18 MED ORDER — SODIUM CHLORIDE 0.9 % IV SOLN
25.0000 mg | Freq: Four times a day (QID) | INTRAVENOUS | Status: DC | PRN
  Administered 2024-12-19: 25 mg via INTRAVENOUS
  Filled 2024-12-18: qty 1
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
## 2024-12-18 NOTE — ED Triage Notes (Signed)
 Pt reports emesis x10 times today at home. Pt states that she has had episodes like this x5 years.

## 2024-12-19 DIAGNOSIS — Z79890 Hormone replacement therapy (postmenopausal): Secondary | ICD-10-CM | POA: Diagnosis not present

## 2024-12-19 DIAGNOSIS — E063 Autoimmune thyroiditis: Secondary | ICD-10-CM | POA: Diagnosis present

## 2024-12-19 DIAGNOSIS — R739 Hyperglycemia, unspecified: Secondary | ICD-10-CM

## 2024-12-19 DIAGNOSIS — F411 Generalized anxiety disorder: Secondary | ICD-10-CM | POA: Diagnosis present

## 2024-12-19 DIAGNOSIS — Z7951 Long term (current) use of inhaled steroids: Secondary | ICD-10-CM | POA: Diagnosis not present

## 2024-12-19 DIAGNOSIS — J439 Emphysema, unspecified: Secondary | ICD-10-CM | POA: Diagnosis present

## 2024-12-19 DIAGNOSIS — Z91048 Other nonmedicinal substance allergy status: Secondary | ICD-10-CM | POA: Diagnosis not present

## 2024-12-19 DIAGNOSIS — Z888 Allergy status to other drugs, medicaments and biological substances status: Secondary | ICD-10-CM | POA: Diagnosis not present

## 2024-12-19 DIAGNOSIS — D72829 Elevated white blood cell count, unspecified: Secondary | ICD-10-CM | POA: Diagnosis present

## 2024-12-19 DIAGNOSIS — K760 Fatty (change of) liver, not elsewhere classified: Secondary | ICD-10-CM | POA: Diagnosis present

## 2024-12-19 DIAGNOSIS — K2100 Gastro-esophageal reflux disease with esophagitis, without bleeding: Secondary | ICD-10-CM | POA: Diagnosis present

## 2024-12-19 DIAGNOSIS — K219 Gastro-esophageal reflux disease without esophagitis: Secondary | ICD-10-CM | POA: Diagnosis present

## 2024-12-19 DIAGNOSIS — R1110 Vomiting, unspecified: Secondary | ICD-10-CM | POA: Diagnosis present

## 2024-12-19 DIAGNOSIS — I10 Essential (primary) hypertension: Secondary | ICD-10-CM | POA: Diagnosis present

## 2024-12-19 DIAGNOSIS — Z79899 Other long term (current) drug therapy: Secondary | ICD-10-CM | POA: Diagnosis not present

## 2024-12-19 DIAGNOSIS — E1165 Type 2 diabetes mellitus with hyperglycemia: Secondary | ICD-10-CM | POA: Diagnosis present

## 2024-12-19 DIAGNOSIS — R160 Hepatomegaly, not elsewhere classified: Secondary | ICD-10-CM | POA: Diagnosis present

## 2024-12-19 DIAGNOSIS — Z91041 Radiographic dye allergy status: Secondary | ICD-10-CM | POA: Diagnosis not present

## 2024-12-19 DIAGNOSIS — I2510 Atherosclerotic heart disease of native coronary artery without angina pectoris: Secondary | ICD-10-CM | POA: Diagnosis present

## 2024-12-19 DIAGNOSIS — E8729 Other acidosis: Secondary | ICD-10-CM

## 2024-12-19 DIAGNOSIS — R112 Nausea with vomiting, unspecified: Secondary | ICD-10-CM | POA: Diagnosis not present

## 2024-12-19 DIAGNOSIS — Z8249 Family history of ischemic heart disease and other diseases of the circulatory system: Secondary | ICD-10-CM | POA: Diagnosis not present

## 2024-12-19 DIAGNOSIS — E860 Dehydration: Secondary | ICD-10-CM | POA: Diagnosis present

## 2024-12-19 DIAGNOSIS — E782 Mixed hyperlipidemia: Secondary | ICD-10-CM | POA: Diagnosis present

## 2024-12-19 DIAGNOSIS — Z87820 Personal history of traumatic brain injury: Secondary | ICD-10-CM | POA: Diagnosis not present

## 2024-12-19 DIAGNOSIS — Z603 Acculturation difficulty: Secondary | ICD-10-CM | POA: Diagnosis present

## 2024-12-19 DIAGNOSIS — E872 Acidosis, unspecified: Secondary | ICD-10-CM | POA: Diagnosis present

## 2024-12-19 DIAGNOSIS — F17210 Nicotine dependence, cigarettes, uncomplicated: Secondary | ICD-10-CM | POA: Diagnosis present

## 2024-12-19 LAB — PHOSPHORUS: Phosphorus: 3.8 mg/dL (ref 2.5–4.6)

## 2024-12-19 LAB — CBC WITH DIFFERENTIAL/PLATELET
Abs Immature Granulocytes: 0.08 K/uL — ABNORMAL HIGH (ref 0.00–0.07)
Basophils Absolute: 0.1 K/uL (ref 0.0–0.1)
Basophils Relative: 1 %
Eosinophils Absolute: 0.1 K/uL (ref 0.0–0.5)
Eosinophils Relative: 1 %
HCT: 37.9 % (ref 36.0–46.0)
Hemoglobin: 12.9 g/dL (ref 12.0–15.0)
Immature Granulocytes: 1 %
Lymphocytes Relative: 27 %
Lymphs Abs: 3.5 K/uL (ref 0.7–4.0)
MCH: 33.2 pg (ref 26.0–34.0)
MCHC: 34 g/dL (ref 30.0–36.0)
MCV: 97.7 fL (ref 80.0–100.0)
Monocytes Absolute: 0.5 K/uL (ref 0.1–1.0)
Monocytes Relative: 4 %
Neutro Abs: 8.6 K/uL — ABNORMAL HIGH (ref 1.7–7.7)
Neutrophils Relative %: 66 %
Platelets: 268 K/uL (ref 150–400)
RBC: 3.88 MIL/uL (ref 3.87–5.11)
RDW: 13.9 % (ref 11.5–15.5)
WBC: 12.9 K/uL — ABNORMAL HIGH (ref 4.0–10.5)
nRBC: 0 % (ref 0.0–0.2)

## 2024-12-19 LAB — TROPONIN T, HIGH SENSITIVITY
Troponin T High Sensitivity: <15 ng/L (ref 0–19)
Troponin T High Sensitivity: <15 ng/L (ref 0–19)

## 2024-12-19 LAB — COMPREHENSIVE METABOLIC PANEL WITH GFR
ALT: 22 U/L (ref 0–44)
AST: 36 U/L (ref 15–41)
Albumin: 4.8 g/dL (ref 3.5–5.0)
Alkaline Phosphatase: 111 U/L (ref 38–126)
Anion gap: 11 (ref 5–15)
BUN: 10 mg/dL (ref 6–20)
CO2: 27 mmol/L (ref 22–32)
Calcium: 9.5 mg/dL (ref 8.9–10.3)
Chloride: 96 mmol/L — ABNORMAL LOW (ref 98–111)
Creatinine, Ser: 0.79 mg/dL (ref 0.44–1.00)
GFR, Estimated: >60 mL/min
Glucose, Bld: 155 mg/dL — ABNORMAL HIGH (ref 70–99)
Potassium: 4.1 mmol/L (ref 3.5–5.1)
Sodium: 134 mmol/L — ABNORMAL LOW (ref 135–145)
Total Bilirubin: 0.5 mg/dL (ref 0.0–1.2)
Total Protein: 7.7 g/dL (ref 6.5–8.1)

## 2024-12-19 LAB — BASIC METABOLIC PANEL WITH GFR
Anion gap: 12 (ref 5–15)
BUN: 10 mg/dL (ref 6–20)
CO2: 25 mmol/L (ref 22–32)
Calcium: 9.4 mg/dL (ref 8.9–10.3)
Chloride: 96 mmol/L — ABNORMAL LOW (ref 98–111)
Creatinine, Ser: 0.76 mg/dL (ref 0.44–1.00)
GFR, Estimated: >60 mL/min
Glucose, Bld: 152 mg/dL — ABNORMAL HIGH (ref 70–99)
Potassium: 4 mmol/L (ref 3.5–5.1)
Sodium: 133 mmol/L — ABNORMAL LOW (ref 135–145)

## 2024-12-19 LAB — BETA-HYDROXYBUTYRIC ACID: Beta-Hydroxybutyric Acid: 0.22 mmol/L (ref 0.05–0.27)

## 2024-12-19 LAB — CBG MONITORING, ED
Glucose-Capillary: 110 mg/dL — ABNORMAL HIGH (ref 70–99)
Glucose-Capillary: 106 mg/dL — ABNORMAL HIGH (ref 70–99)

## 2024-12-19 LAB — HEMOGLOBIN A1C
Hgb A1c MFr Bld: 7.1 % — ABNORMAL HIGH (ref 4.8–5.6)
Mean Plasma Glucose: 157.07 mg/dL

## 2024-12-19 LAB — PROCALCITONIN: Procalcitonin: 0.17 ng/mL

## 2024-12-19 LAB — MAGNESIUM: Magnesium: 2.1 mg/dL (ref 1.7–2.4)

## 2024-12-19 LAB — GLUCOSE, CAPILLARY
Glucose-Capillary: 123 mg/dL — ABNORMAL HIGH (ref 70–99)
Glucose-Capillary: 122 mg/dL — ABNORMAL HIGH (ref 70–99)

## 2024-12-19 MED ORDER — INSULIN ASPART 100 UNIT/ML IJ SOLN: 0.0000 [IU] | Freq: Every day | INTRAMUSCULAR | Status: DC

## 2024-12-19 MED ORDER — ENOXAPARIN SODIUM 40 MG/0.4ML IJ SOSY
40.0000 mg | PREFILLED_SYRINGE | INTRAMUSCULAR | Status: DC
  Administered 2024-12-19 – 2024-12-21 (×3): 40 mg via SUBCUTANEOUS
  Filled 2024-12-19 (×3): qty 0.4

## 2024-12-19 MED ORDER — LISINOPRIL 5 MG PO TABS
2.5000 mg | ORAL_TABLET | Freq: Every day | ORAL | Status: DC
  Filled 2024-12-19: qty 1

## 2024-12-19 MED ORDER — PROMETHAZINE HCL 25 MG/ML IJ SOLN
INTRAMUSCULAR | Status: AC
  Filled 2024-12-19: qty 1

## 2024-12-19 MED ORDER — TOPIRAMATE 25 MG PO TABS
50.0000 mg | ORAL_TABLET | Freq: Two times a day (BID) | ORAL | Status: DC
  Administered 2024-12-19 – 2024-12-20 (×3): 50 mg via ORAL
  Filled 2024-12-19 (×3): qty 2

## 2024-12-19 MED ORDER — ACETAMINOPHEN 325 MG PO TABS
650.0000 mg | ORAL_TABLET | Freq: Four times a day (QID) | ORAL | Status: DC | PRN
  Administered 2024-12-19 – 2024-12-20 (×2): 650 mg via ORAL
  Filled 2024-12-19 (×2): qty 2

## 2024-12-19 MED ORDER — INSULIN ASPART 100 UNIT/ML IJ SOLN
0.0000 [IU] | Freq: Three times a day (TID) | INTRAMUSCULAR | Status: DC
  Administered 2024-12-19 – 2024-12-20 (×2): 1 [IU] via SUBCUTANEOUS
  Filled 2024-12-19: qty 1

## 2024-12-19 NOTE — ED Notes (Signed)
 Per Cy with lab, Patient told me she didn't want to be admitted and that she didn't want anything done to her

## 2024-12-19 NOTE — TOC CM/SW Note (Signed)
 Transition of Care [facility]) - Inpatient Brief Assessment   Patient Details  Name: Melanie Shepard MRN: [id number] Date of Birth: 1966/08/15  Transition of Care [facility]) CM/SW Contact:    Lucie Lunger, LCSWA Phone Number: 12/19/2024, 8:16 AM   Clinical Narrative: Transition of Care Department [facility]) has reviewed patient and no TOC needs have been identified at this time. We will continue to monitor patient advancement through interdiciplinary progression rounds. If new patient transition needs arise, please place a TOC consult.  Transition of Care Asessment: Insurance and Status: Insurance coverage has been reviewed Patient has primary care physician: Yes Home environment has been reviewed: From home Prior level of function:: Independent Prior/Current Home Services: No current home services Social Drivers of Health Review: SDOH reviewed no interventions necessary Readmission risk has been reviewed: Yes Transition of care needs: no transition of care needs at this time

## 2024-12-19 NOTE — Plan of Care (Signed)

## 2024-12-19 NOTE — Progress Notes (Addendum)
 " PROGRESS NOTE    Melanie Shepard  FMW:[id number] DOB: [birth year] DOA: 12/18/2024 [facility]CP: [facility]   Brief Narrative:  Melanie Shepard is a 59 y.o. female with medical history significant of HTN, diabetes mellitus, nonalcoholic liver disease, emphysema, seizure, anxiety, migraines, intractable nausea and vomiting with abdominal pain and headache who presents to the emergency department due to multiple episodes (about 10 times) of vomiting at home.  Patient has had several episodes of same symptoms over the past years and has had both inpatient and outpatient GI workout.  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   In the ED, she was hemodynamically stable, CBC was significant for WBC of 16.2 and MCV of 100.5.  BMP showed sodium 131, chloride 93, bicarb 16, blood glucose 178.  Assessment & Plan:   Principal Problem:   Intractable nausea and vomiting  Intractable nausea and vomiting with abdominal pain: This is a recurrent issue.  CT abdomen negative for acute findings.  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  Continue to treat symptomatically.  Dehydration/metabolic acidosis/leukocytosis: Dehydration appears to be better, metabolic acidosis resolved, leukocytosis improving.  Afebrile.  No source of infection.  Monitor labs.  GERD: She has longstanding history of GERD, consumes at least 3 cans of sodas and 3 cans of Gatorade every day.  Also smokes.  Does not consume alcohol.  Takes omeprazole 20 mg twice daily at home.  Currently on 40 mg IV twice daily.  Hypothyroidism: Continue Synthroid .  Check TSH.  Headache: Resume Topamax  scheduled and as needed ibuprofen .  Elevated blood pressure: No history of hypertension.  Blood pressure was elevated, patient was started on lisinopril  2.5 mg.  Blood pressure was likely elevated due to nausea and vomiting.  I  have discontinued lisinopril .  Blood pressure is within normal range now.  Hepatomegaly with diffuse extensive steatosis/hyperlipidemia: On Repatha .  Mild emphysema: Continue Symbicort .  Stable.  Type 2 diabetes mellitus: Came in with hyperglycemia, hemoglobin A1c 7.1.  Previously hemoglobin A1c had been ranging anywhere from 5.8-7.0.  Not on any medications PTA.  Starting on SSI.  DVT prophylaxis: Lovenox    Code Status: Full Code  Family Communication:  None present at bedside.  Plan of care discussed with patient in length and he/she verbalized understanding and agreed with it.  Status is: Observation The patient will require care spanning > 2 midnights and should be moved to inpatient because: Still symptomatic.   Estimated body mass index is 21.14 kg/m as calculated from the following:   Height as of this encounter: 5' 7 (1.702 m).   Weight as of this encounter: 61.2 kg.    Nutritional Assessment: Body mass index is 21.14 kg/m.SABRA Seen by dietician.  I agree with the assessment and plan as outlined below: Nutrition Status:        . Skin Assessment: I have examined the patient's skin and I agree with the wound assessment as performed by the wound care RN as outlined below:    Consultants:  None  Procedures:  None  Antimicrobials:  Anti-infectives (From admission, onward)    None         Subjective: Seen and examined, still with nausea vomiting, recent vomiting at 5 AM this morning.  Continues to complain of lower abdominal pain.  She is willing to try clear liquid diet.  Objective: Vitals:  12/19/24 0215 12/19/24 0345 12/19/24 0703 12/19/24 0800  BP: (!) 142/90 120/89 110/61 133/83  Pulse: 74  78 90  Resp: 19 14 16 18   Temp:  98.4 F (36.9 C)    TempSrc:  Oral    SpO2:  98% 93% 92%  Weight:      Height:        Intake/Output Summary (Last 24 hours) at 12/19/2024 0854 Last data filed at 12/18/2024 2137 Gross per 24 hour  Intake 1000 ml  Output --   Net 1000 ml   Filed Weights   12/18/24 1630  Weight: 61.2 kg    Examination:  General exam: Appears calm and comfortable  Respiratory system: Clear to auscultation. Respiratory effort normal. Cardiovascular system: S1 & S2 heard, RRR. No JVD, murmurs, rubs, gallops or clicks. No pedal edema. Gastrointestinal system: Abdomen is nondistended, soft, mild lower abdominal tenderness. No organomegaly or masses felt. Normal bowel sounds heard. Central nervous system: Alert and oriented. No focal neurological deficits. Extremities: Symmetric 5 x 5 power. Skin: No rashes, lesions or ulcers Psychiatry: Judgement and insight appear normal. Mood & affect appropriate.    Data Reviewed: I have personally reviewed following labs and imaging studies  CBC: Recent Labs  Lab 12/18/24 1651 12/19/24 0237  WBC 16.2* 12.9*  NEUTROABS  --  8.6*  HGB 14.8 12.9  HCT 44.5 37.9  MCV 100.5* 97.7  PLT 305 268   Basic Metabolic Panel: Recent Labs  Lab 12/18/24 1651 12/19/24 0005 12/19/24 0237  NA 131* 133* 134*  K 3.9 4.0 4.1  CL 93* 96* 96*  CO2 16* 25 27  GLUCOSE 178* 152* 155*  BUN 9 10 10   CREATININE 0.87 0.76 0.79  CALCIUM 9.8 9.4 9.5  MG 2.1  --   --   PHOS  --   --  3.8   GFR: Estimated Creatinine Clearance: 74.1 mL/min (by C-G formula based on SCr of 0.79 mg/dL). Liver Function Tests: Recent Labs  Lab 12/18/24 1651 12/19/24 0237  AST 56* 36  ALT 26 22  ALKPHOS 125 111  BILITOT 0.6 0.5  PROT 8.5* 7.7  ALBUMIN 5.0 4.8   Recent Labs  Lab 12/18/24 1651  LIPASE 46   No results for input(s): AMMONIA in the last 168 hours. Coagulation Profile: No results for input(s): INR, PROTIME in the last 168 hours. Cardiac Enzymes: No results for input(s): CKTOTAL, CKMB, CKMBINDEX, TROPONINI in the last 168 hours. BNP (last 3 results) No results for input(s): PROBNP in the last 8760 hours. HbA1C: Recent Labs    12/18/24 1651  HGBA1C 7.1*   CBG: Recent Labs   Lab 12/19/24 0817  GLUCAP 110*   Lipid Profile: No results for input(s): CHOL, HDL, LDLCALC, TRIG, CHOLHDL, LDLDIRECT in the last 72 hours. Thyroid  Function Tests: No results for input(s): TSH, T4TOTAL, FREET4, T3FREE, THYROIDAB in the last 72 hours. Anemia Panel: No results for input(s): VITAMINB12, FOLATE, FERRITIN, TIBC, IRON, RETICCTPCT in the last 72 hours. Sepsis Labs: Recent Labs  Lab 12/19/24 0237  PROCALCITON 0.17    No results found for this or any previous visit (from the past 240 hours).   Radiology Studies: CT ABDOMEN PELVIS WO CONTRAST Result Date: 12/18/2024 EXAM: CT ABDOMEN AND PELVIS WITHOUT CONTRAST 12/18/2024 08:23:44 PM TECHNIQUE: CT of the abdomen and pelvis was performed without the administration of intravenous contrast. Multiplanar reformatted images are provided for review. Automated exposure control, iterative reconstruction, and/or weight-based adjustment of the mA/kV was utilized to reduce the radiation dose  to as low as reasonably achievable. COMPARISON: CT abdomen and pelvis 08/23/2024. CLINICAL HISTORY: Abdominal pain, acute, nonlocalized. FINDINGS: LOWER CHEST: No acute abnormality. LIVER: The liver is enlarged. There is diffuse fatty infiltration of the liver. GALLBLADDER AND BILE DUCTS: Gallbladder is surgically absent. No biliary ductal dilatation. SPLEEN: No acute abnormality. PANCREAS: No acute abnormality. ADRENAL GLANDS: No acute abnormality. KIDNEYS, URETERS AND BLADDER: No stones in the kidneys or ureters. No hydronephrosis. No perinephric or periureteral stranding. Urinary bladder is unremarkable. GI AND BOWEL: Stomach demonstrates no acute abnormality. The appendix is not definitely visualized. There is no bowel obstruction. PERITONEUM AND RETROPERITONEUM: No ascites. No free air. VASCULATURE: Aorta is normal in caliber. There are atherosclerotic calcifications throughout the aorta and iliac arteries. LYMPH NODES: No  lymphadenopathy. REPRODUCTIVE ORGANS: The uterus is surgically absent. BONES AND SOFT TISSUES: No acute osseous abnormality. No focal soft tissue abnormality. IMPRESSION: 1. No acute findings in the abdomen or pelvis. 2. Hepatomegaly with diffuse hepatic steatosis. Electronically signed by: Greig Pique MD MD 12/18/2024 08:31 PM EST RP Workstation: [id number]    Scheduled Meds:  clonazePAM   1 mg Oral BID   fluticasone  furoate-vilanterol  1 puff Inhalation Daily   levothyroxine   137 mcg Oral Q0600   lisinopril   2.5 mg Oral Daily   pantoprazole   40 mg Oral BID   polyethylene glycol  17 g Oral BID   senna-docusate  2 tablet Oral QHS   Continuous Infusions:  sodium chloride  100 mL/hr at 12/19/24 0500   promethazine  (PHENERGAN ) injection (IM or IVPB)       LOS: 0 days   Fredia Skeeter, MD [facility]  12/19/2024, 8:54 AM   *Please note that this is a verbal dictation therefore any spelling or grammatical errors are due to the Dragon Medical One system interpretation.  Please page via Amion and do not message via secure chat for urgent patient care matters. Secure chat can be used for non urgent patient care matters.  How to contact the [facility] Attending or Consulting provider 7A - 7P or covering provider during after hours 7P -7A, for this patient?  Check the care team in [facility] and look for a) attending/consulting [facility] provider listed and b) the [facility] team listed. Page or secure chat 7A-7P. Log into [email or web address] and use [employer]'s universal password to access. If you do not have the password, please contact the hospital operator. Locate the [facility] provider you are looking for under [facility] and page to a number that you can be directly reached. If you still have difficulty reaching the provider, please page the [facility] (Director on Call) for the Hospitalists listed on amion for assistance.  "

## 2024-12-20 LAB — GLUCOSE, CAPILLARY
Glucose-Capillary: 94 mg/dL (ref 70–99)
Glucose-Capillary: 134 mg/dL — ABNORMAL HIGH (ref 70–99)
Glucose-Capillary: 102 mg/dL — ABNORMAL HIGH (ref 70–99)
Glucose-Capillary: 185 mg/dL — ABNORMAL HIGH (ref 70–99)

## 2024-12-20 NOTE — Progress Notes (Signed)
 " PROGRESS NOTE    [name]  FMW:[id number] DOB: [birth year] DOA: 12/18/2024 PCP: [facility]   Brief Narrative:  Melanie Shepard is a 59 y.o. female with medical history significant of HTN, diabetes mellitus, nonalcoholic liver disease, emphysema, seizure, anxiety, migraines, intractable nausea and vomiting with abdominal pain and headache who presents to the emergency department due to multiple episodes (about 10 times) of vomiting at home.  Patient has had several episodes of same symptoms over the past years and has had both inpatient and outpatient GI workout.  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   In the ED, she was hemodynamically stable, CBC was significant for WBC of 16.2 and MCV of 100.5.  BMP showed sodium 131, chloride 93, bicarb 16, blood glucose 178.  Assessment & Plan:   Principal Problem:   Intractable nausea and vomiting  Intractable nausea and vomiting with abdominal pain: This is a recurrent issue.  CT abdomen negative for acute findings.  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  Continue to treat symptomatically. - Will advance diet as tolerated today  Dehydration/metabolic acidosis/leukocytosis: Dehydration appears to be better, metabolic acidosis resolved, leukocytosis improving.  Afebrile.  No source of infection.  Monitor labs.  GERD: She has longstanding history of GERD, consumes at least 3 cans of sodas and 3 cans of Gatorade every day.  Also smokes.  Does not consume alcohol.  Takes omeprazole 20 mg twice daily at home.  Currently on 40 mg IV twice daily.  Hypothyroidism: Continue Synthroid .  Check TSH.  Headache: Resume Topamax  scheduled and as needed ibuprofen .  Elevated blood pressure: No history of hypertension.  Blood pressure was elevated, patient was started on lisinopril  2.5 mg.  Blood pressure was likely  elevated due to nausea and vomiting.  I have discontinued lisinopril .  Blood pressure is within normal range now.  Hepatomegaly with diffuse extensive steatosis/hyperlipidemia: On Repatha .  Mild emphysema: Continue Symbicort .  Stable.  Type 2 diabetes mellitus: Came in with hyperglycemia, hemoglobin A1c 7.1.  Previously hemoglobin A1c had been ranging anywhere from 5.8-7.0.  Not on any medications PTA.  Starting on SSI.  DVT prophylaxis: enoxaparin  (LOVENOX ) injection 40 mg Start: 12/19/24 1000Lovenox   Code Status: Full Code  Family Communication:  None present at bedside.  Plan of care discussed with patient in length and he/she verbalized understanding and agreed with it.  Status is: Observation The patient will require care spanning > 2 midnights and should be moved to inpatient because: Still symptomatic.   Estimated body mass index is 21.14 kg/m as calculated from the following:   Height as of this encounter: 5' 7 (1.702 m).   Weight as of this encounter: 61.2 kg.    Nutritional Assessment: Body mass index is 21.14 kg/m.SABRA Seen by dietician.  I agree with the assessment and plan as outlined below: Nutrition Status:        . Skin Assessment: I have examined the patient's skin and I agree with the wound assessment as performed by the wound care RN as outlined below:    Consultants:  None  Procedures:  None  Antimicrobials:  Anti-infectives (From admission, onward)    None         Subjective: Seen and examined, still with nausea, denies vomiting.  Tolerating clear liquid diet okay.  Agrees to advancing diet.  Objective: Vitals:  12/19/24 1322 12/19/24 1757 12/19/24 2017 12/20/24 0549  BP: 125/75 112/65 107/62 98/64  Pulse: 88 80 75 74  Resp: 18 18 16 17   Temp: 97.6 F (36.4 C) (!) 97.4 F (36.3 C) 98.5 F (36.9 C) 98.9 F (37.2 C)  TempSrc: Oral Oral    SpO2: 98% 100% 96% 94%  Weight:      Height:        Intake/Output Summary (Last 24 hours)  at 12/20/2024 1028 Last data filed at 12/20/2024 9187 Gross per 24 hour  Intake 1552.27 ml  Output 800 ml  Net 752.27 ml   Filed Weights   12/18/24 1630  Weight: 61.2 kg    Examination:  General exam: Appears calm and comfortable  Respiratory system: Clear to auscultation. Respiratory effort normal. Cardiovascular system: S1 & S2 heard, RRR. No JVD, murmurs, rubs, gallops or clicks. No pedal edema. Gastrointestinal system: Abdomen is nondistended, soft, mild lower abdominal tenderness. No organomegaly or masses felt. Normal bowel sounds heard. Central nervous system: Alert and oriented. No focal neurological deficits. Extremities: Symmetric 5 x 5 power. Skin: No rashes, lesions or ulcers Psychiatry: Judgement and insight appear normal. Mood & affect appropriate.    Data Reviewed: I have personally reviewed following labs and imaging studies  CBC: Recent Labs  Lab 12/18/24 1651 12/19/24 0237  WBC 16.2* 12.9*  NEUTROABS  --  8.6*  HGB 14.8 12.9  HCT 44.5 37.9  MCV 100.5* 97.7  PLT 305 268   Basic Metabolic Panel: Recent Labs  Lab 12/18/24 1651 12/19/24 0005 12/19/24 0237  NA 131* 133* 134*  K 3.9 4.0 4.1  CL 93* 96* 96*  CO2 16* 25 27  GLUCOSE 178* 152* 155*  BUN 9 10 10   CREATININE 0.87 0.76 0.79  CALCIUM 9.8 9.4 9.5  MG 2.1  --   --   PHOS  --   --  3.8   GFR: Estimated Creatinine Clearance: 74.1 mL/min (by C-G formula based on SCr of 0.79 mg/dL). Liver Function Tests: Recent Labs  Lab 12/18/24 1651 12/19/24 0237  AST 56* 36  ALT 26 22  ALKPHOS 125 111  BILITOT 0.6 0.5  PROT 8.5* 7.7  ALBUMIN 5.0 4.8   Recent Labs  Lab 12/18/24 1651  LIPASE 46   No results for input(s): AMMONIA in the last 168 hours. Coagulation Profile: No results for input(s): INR, PROTIME in the last 168 hours. Cardiac Enzymes: No results for input(s): CKTOTAL, CKMB, CKMBINDEX, TROPONINI in the last 168 hours. BNP (last 3 results) No results for input(s):  PROBNP in the last 8760 hours. HbA1C: Recent Labs    12/18/24 1651  HGBA1C 7.1*   CBG: Recent Labs  Lab 12/19/24 0817 12/19/24 1204 12/19/24 1630 12/19/24 2017  GLUCAP 110* 106* 123* 122*   Lipid Profile: No results for input(s): CHOL, HDL, LDLCALC, TRIG, CHOLHDL, LDLDIRECT in the last 72 hours. Thyroid  Function Tests: No results for input(s): TSH, T4TOTAL, FREET4, T3FREE, THYROIDAB in the last 72 hours. Anemia Panel: No results for input(s): VITAMINB12, FOLATE, FERRITIN, TIBC, IRON, RETICCTPCT in the last 72 hours. Sepsis Labs: Recent Labs  Lab 12/19/24 0237  PROCALCITON 0.17    No results found for this or any previous visit (from the past 240 hours).   Radiology Studies: CT ABDOMEN PELVIS WO CONTRAST Result Date: 12/18/2024 EXAM: CT ABDOMEN AND PELVIS WITHOUT CONTRAST 12/18/2024 08:23:44 PM TECHNIQUE: CT of the abdomen and pelvis was performed without the administration of intravenous contrast. Multiplanar reformatted images are provided for  review. Automated exposure control, iterative reconstruction, and/or weight-based adjustment of the mA/kV was utilized to reduce the radiation dose to as low as reasonably achievable. COMPARISON: CT abdomen and pelvis 08/23/2024. CLINICAL HISTORY: Abdominal pain, acute, nonlocalized. FINDINGS: LOWER CHEST: No acute abnormality. LIVER: The liver is enlarged. There is diffuse fatty infiltration of the liver. GALLBLADDER AND BILE DUCTS: Gallbladder is surgically absent. No biliary ductal dilatation. SPLEEN: No acute abnormality. PANCREAS: No acute abnormality. ADRENAL GLANDS: No acute abnormality. KIDNEYS, URETERS AND BLADDER: No stones in the kidneys or ureters. No hydronephrosis. No perinephric or periureteral stranding. Urinary bladder is unremarkable. GI AND BOWEL: Stomach demonstrates no acute abnormality. The appendix is not definitely visualized. There is no bowel obstruction. PERITONEUM AND  RETROPERITONEUM: No ascites. No free air. VASCULATURE: Aorta is normal in caliber. There are atherosclerotic calcifications throughout the aorta and iliac arteries. LYMPH NODES: No lymphadenopathy. REPRODUCTIVE ORGANS: The uterus is surgically absent. BONES AND SOFT TISSUES: No acute osseous abnormality. No focal soft tissue abnormality. IMPRESSION: 1. No acute findings in the abdomen or pelvis. 2. Hepatomegaly with diffuse hepatic steatosis. Electronically signed by: Greig Pique MD MD 12/18/2024 08:31 PM EST RP Workstation: [id number]    Scheduled Meds:  clonazePAM   1 mg Oral BID   enoxaparin  (LOVENOX ) injection  40 mg Subcutaneous Q24H   fluticasone  furoate-vilanterol  1 puff Inhalation Daily   insulin  aspart  0-5 Units Subcutaneous QHS   insulin  aspart  0-9 Units Subcutaneous TID WC   levothyroxine   137 mcg Oral Q0600   pantoprazole   40 mg Oral BID   polyethylene glycol  17 g Oral BID   senna-docusate  2 tablet Oral QHS   topiramate   50 mg Oral BID   Continuous Infusions:  promethazine  (PHENERGAN ) injection (IM or IVPB) 25 mg (12/19/24 2126)     LOS: 1 day   Derryl Duval, MD [facility]  12/20/2024, 10:28 AM   *Please note that this is a verbal dictation therefore any spelling or grammatical errors are due to the Dragon Medical One system interpretation.  Please page via Amion and do not message via secure chat for urgent patient care matters. Secure chat can be used for non urgent patient care matters.  How to contact the [facility] Attending or Consulting provider 7A - 7P or covering provider during after hours 7P -7A, for this patient?  Check the care team in [facility] and look for a) attending/consulting [facility] provider listed and b) the [facility] team listed. Page or secure chat 7A-7P. Log into [email or web address] and use [facility]'s universal password to access. If you do not have the password, please contact the hospital operator. Locate the [facility] provider you are looking for under [facility] and page to a number that you can be directly reached. If you still have difficulty reaching the provider, please page the [facility] (Director on Call) for the Hospitalists listed on amion for assistance.  "

## 2024-12-20 NOTE — Plan of Care (Signed)

## 2024-12-21 ENCOUNTER — Encounter (HOSPITAL_COMMUNITY): Payer: Self-pay | Admitting: Internal Medicine

## 2024-12-21 LAB — GLUCOSE, CAPILLARY
Glucose-Capillary: 105 mg/dL — ABNORMAL HIGH (ref 70–99)
Glucose-Capillary: 90 mg/dL (ref 70–99)

## 2024-12-21 MED ORDER — DICYCLOMINE HCL 10 MG PO CAPS
10.0000 mg | ORAL_CAPSULE | Freq: Three times a day (TID) | ORAL | Status: DC
  Administered 2024-12-21: 10 mg via ORAL
  Filled 2024-12-21: qty 1

## 2024-12-21 MED ORDER — HYDROCODONE-ACETAMINOPHEN 5-325 MG PO TABS
1.0000 | ORAL_TABLET | Freq: Four times a day (QID) | ORAL | Status: DC | PRN
  Administered 2024-12-21: 1 via ORAL
  Filled 2024-12-21: qty 1

## 2024-12-21 NOTE — Plan of Care (Signed)
 " Problem: Education: Goal: Knowledge of General Education information will improve Description: Including pain rating scale, medication(s)/side effects and non-pharmacologic comfort measures 12/21/2024 1443 by Mathews Norleen POUR, RN Outcome: Adequate for Discharge 12/21/2024 0944 by Mathews Norleen POUR, RN Outcome: Progressing   Problem: Health Behavior/Discharge Planning: Goal: Ability to manage health-related needs will improve 12/21/2024 1443 by Mathews Norleen POUR, RN Outcome: Adequate for Discharge 12/21/2024 0944 by Mathews Norleen POUR, RN Outcome: Progressing   Problem: Clinical Measurements: Goal: Ability to maintain clinical measurements within normal limits will improve 12/21/2024 1443 by Mathews Norleen POUR, RN Outcome: Adequate for Discharge 12/21/2024 0944 by Mathews Norleen POUR, RN Outcome: Progressing Goal: Will remain free from infection 12/21/2024 1443 by Mathews Norleen POUR, RN Outcome: Adequate for Discharge 12/21/2024 0944 by Mathews Norleen POUR, RN Outcome: Progressing Goal: Diagnostic test results will improve 12/21/2024 1443 by Mathews Norleen POUR, RN Outcome: Adequate for Discharge 12/21/2024 0944 by Mathews Norleen POUR, RN Outcome: Progressing Goal: Respiratory complications will improve 12/21/2024 1443 by Mathews Norleen POUR, RN Outcome: Adequate for Discharge 12/21/2024 0944 by Mathews Norleen POUR, RN Outcome: Progressing Goal: Cardiovascular complication will be avoided 12/21/2024 1443 by Mathews Norleen POUR, RN Outcome: Adequate for Discharge 12/21/2024 0944 by Mathews Norleen POUR, RN Outcome: Progressing   Problem: Activity: Goal: Risk for activity intolerance will decrease 12/21/2024 1443 by Mathews Norleen POUR, RN Outcome: Adequate for Discharge 12/21/2024 0944 by Mathews Norleen POUR, RN Outcome: Progressing   Problem: Nutrition: Goal: Adequate nutrition will be maintained 12/21/2024 1443 by Mathews Norleen POUR, RN Outcome: Adequate for Discharge 12/21/2024 0944 by Mathews Norleen POUR, RN Outcome: Progressing   Problem: Coping: Goal: Level of anxiety  will decrease 12/21/2024 1443 by Mathews Norleen POUR, RN Outcome: Adequate for Discharge 12/21/2024 0944 by Mathews Norleen POUR, RN Outcome: Progressing   Problem: Elimination: Goal: Will not experience complications related to bowel motility 12/21/2024 1443 by Mathews Norleen POUR, RN Outcome: Adequate for Discharge 12/21/2024 0944 by Mathews Norleen POUR, RN Outcome: Progressing Goal: Will not experience complications related to urinary retention 12/21/2024 1443 by Mathews Norleen POUR, RN Outcome: Adequate for Discharge 12/21/2024 0944 by Mathews Norleen POUR, RN Outcome: Progressing   Problem: Pain Managment: Goal: General experience of comfort will improve and/or be controlled 12/21/2024 1443 by Mathews Norleen POUR, RN Outcome: Adequate for Discharge 12/21/2024 0944 by Mathews Norleen POUR, RN Outcome: Progressing   Problem: Safety: Goal: Ability to remain free from injury will improve 12/21/2024 1443 by Mathews Norleen POUR, RN Outcome: Adequate for Discharge 12/21/2024 0944 by Mathews Norleen POUR, RN Outcome: Progressing   Problem: Skin Integrity: Goal: Risk for impaired skin integrity will decrease 12/21/2024 1443 by Mathews Norleen POUR, RN Outcome: Adequate for Discharge 12/21/2024 0944 by Mathews Norleen POUR, RN Outcome: Progressing   Problem: Education: Goal: Ability to describe self-care measures that may prevent or decrease complications (Diabetes Survival Skills Education) will improve 12/21/2024 1443 by Mathews Norleen POUR, RN Outcome: Adequate for Discharge 12/21/2024 0944 by Mathews Norleen POUR, RN Outcome: Progressing Goal: Individualized Educational Video(s) 12/21/2024 1443 by Mathews Norleen POUR, RN Outcome: Adequate for Discharge 12/21/2024 0944 by Mathews Norleen POUR, RN Outcome: Progressing   Problem: Coping: Goal: Ability to adjust to condition or change in health will improve 12/21/2024 1443 by Mathews Norleen POUR, RN Outcome: Adequate for Discharge 12/21/2024 0944 by Mathews Norleen POUR, RN Outcome: Progressing   Problem: Fluid Volume: Goal: Ability to maintain  a balanced intake and output will improve 12/21/2024 1443 by Mathews Norleen POUR, RN Outcome: Adequate for Discharge  12/21/2024 0944 by Mathews Norleen POUR, RN Outcome: Progressing   Problem: Health Behavior/Discharge Planning: Goal: Ability to identify and utilize available resources and services will improve 12/21/2024 1443 by Mathews Norleen POUR, RN Outcome: Adequate for Discharge 12/21/2024 0944 by Mathews Norleen POUR, RN Outcome: Progressing Goal: Ability to manage health-related needs will improve 12/21/2024 1443 by Mathews Norleen POUR, RN Outcome: Adequate for Discharge 12/21/2024 0944 by Mathews Norleen POUR, RN Outcome: Progressing   Problem: Metabolic: Goal: Ability to maintain appropriate glucose levels will improve 12/21/2024 1443 by Mathews Norleen POUR, RN Outcome: Adequate for Discharge 12/21/2024 0944 by Mathews Norleen POUR, RN Outcome: Progressing   Problem: Nutritional: Goal: Maintenance of adequate nutrition will improve 12/21/2024 1443 by Mathews Norleen POUR, RN Outcome: Adequate for Discharge 12/21/2024 0944 by Mathews Norleen POUR, RN Outcome: Progressing Goal: Progress toward achieving an optimal weight will improve 12/21/2024 1443 by Mathews Norleen POUR, RN Outcome: Adequate for Discharge 12/21/2024 0944 by Mathews Norleen POUR, RN Outcome: Progressing   Problem: Skin Integrity: Goal: Risk for impaired skin integrity will decrease 12/21/2024 1443 by Mathews Norleen POUR, RN Outcome: Adequate for Discharge 12/21/2024 0944 by Mathews Norleen POUR, RN Outcome: Progressing   Problem: Tissue Perfusion: Goal: Adequacy of tissue perfusion will improve 12/21/2024 1443 by Mathews Norleen POUR, RN Outcome: Adequate for Discharge 12/21/2024 0944 by Mathews Norleen POUR, RN Outcome: Progressing   "

## 2024-12-21 NOTE — Progress Notes (Signed)
 " PROGRESS NOTE    Melanie Shepard  FMW:[id number] DOB: Apr 06, 1966 DOA: 12/18/2024 PCP: [facility]   Brief Narrative:  Melanie Shepard is a 59 y.o. female with medical history significant of HTN, diabetes mellitus, nonalcoholic liver disease, emphysema, seizure, anxiety, migraines, intractable nausea and vomiting with abdominal pain and headache who presents to the emergency department due to multiple episodes (about 10 times) of vomiting at home.  Patient has had several episodes of same symptoms over the past years and has had both inpatient and outpatient GI workout.  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   In the ED, she was hemodynamically stable, CBC was significant for WBC of 16.2 and MCV of 100.5.  BMP showed sodium 131, chloride 93, bicarb 16, blood glucose 178.  Assessment & Plan:   Principal Problem:   Intractable nausea and vomiting  Intractable nausea and vomiting with abdominal pain: This is a recurrent issue.  CT abdomen negative for acute findings.  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  Continue to treat symptomatically. - Diet will be advanced to solid diet today - DC IV pain medications, p.o. Norco available if needed. - If patient does well in the afternoon could discharge her wait until tomorrow if not feeling well  Dehydration/metabolic acidosis/leukocytosis: Dehydration appears to be better, metabolic acidosis resolved, leukocytosis improving.  Afebrile.  No source of infection.  Monitor labs.  GERD: She has longstanding history of GERD, consumes at least 3 cans of sodas and 3 cans of Gatorade every day.  Also smokes.  Does not consume alcohol.  Takes omeprazole 20 mg twice daily at home.  Currently on 40 mg IV twice daily.  Hypothyroidism: Continue Synthroid .  Check TSH.  Headache: Resume Topamax  scheduled and as needed  ibuprofen .  Elevated blood pressure: No history of hypertension.  Blood pressure was elevated, patient was started on lisinopril  2.5 mg.  Blood pressure was likely elevated due to nausea and vomiting.  I have discontinued lisinopril .  Blood pressure is within normal range now.  Hepatomegaly with diffuse extensive steatosis/hyperlipidemia: On Repatha .  Mild emphysema: Continue Symbicort .  Stable.  Type 2 diabetes mellitus: Came in with hyperglycemia, hemoglobin A1c 7.1.  Previously hemoglobin A1c had been ranging anywhere from 5.8-7.0.  Not on any medications PTA.  Starting on SSI.  DVT prophylaxis: enoxaparin  (LOVENOX ) injection 40 mg Start: 12/19/24 1000Lovenox   Code Status: Full Code  Family Communication:  None present at bedside.  Plan of care discussed with patient in length and he/she verbalized understanding and agreed with it.  Status is: Observation The patient will require care spanning > 2 midnights and should be moved to inpatient because: Still symptomatic.   Estimated body mass index is 21.14 kg/m as calculated from the following:   Height as of this encounter: 5' 7 (1.702 m).   Weight as of this encounter: 61.2 kg.    Nutritional Assessment: Body mass index is 21.14 kg/m.Melanie Shepard Seen by dietician.  I agree with the assessment and plan as outlined below: Nutrition Status:        . Skin Assessment: I have examined the patient's skin and I agree with the wound assessment as performed by the wound care RN as outlined below:    Consultants:  None  Procedures:  None  Antimicrobials:  Anti-infectives (From admission, onward)    None  Subjective: Seen and examined, still with nausea, denies vomiting.  Tolerating clear liquid diet okay but has not advanced her diet yet.  Would like to try some solid food today and also wants to avoid IV pain medication.  Objective: Vitals:   12/20/24 1136 12/20/24 1357 12/20/24 2050 12/21/24 0612  BP:  (!) 102/59  130/69 106/64  Pulse:  73 92 74  Resp:   20 20  Temp:  (!) 97.5 F (36.4 C) 97.8 F (36.6 C) (!) 97.5 F (36.4 C)  TempSrc:  Oral Oral Oral  SpO2: 98% 98% 98% 100%  Weight:      Height:        Intake/Output Summary (Last 24 hours) at 12/21/2024 1000 Last data filed at 12/21/2024 0800 Gross per 24 hour  Intake 1290.04 ml  Output --  Net 1290.04 ml   Filed Weights   12/18/24 1630  Weight: 61.2 kg    Examination:  General exam: Appears calm and comfortable  Respiratory system: Clear to auscultation. Respiratory effort normal. Cardiovascular system: S1 & S2 heard, RRR. No JVD, murmurs, rubs, gallops or clicks. No pedal edema. Gastrointestinal system: Abdomen is nondistended, soft, mild lower abdominal tenderness. No organomegaly or masses felt. Normal bowel sounds heard. Central nervous system: Alert and oriented. No focal neurological deficits. Extremities: Symmetric 5 x 5 power. Skin: No rashes, lesions or ulcers Psychiatry: Judgement and insight appear normal. Mood & affect appropriate.    Data Reviewed: I have personally reviewed following labs and imaging studies  CBC: Recent Labs  Lab 12/18/24 1651 12/19/24 0237  WBC 16.2* 12.9*  NEUTROABS  --  8.6*  HGB 14.8 12.9  HCT 44.5 37.9  MCV 100.5* 97.7  PLT 305 268   Basic Metabolic Panel: Recent Labs  Lab 12/18/24 1651 12/19/24 0005 12/19/24 0237  NA 131* 133* 134*  K 3.9 4.0 4.1  CL 93* 96* 96*  CO2 16* 25 27  GLUCOSE 178* 152* 155*  BUN 9 10 10   CREATININE 0.87 0.76 0.79  CALCIUM 9.8 9.4 9.5  MG 2.1  --   --   PHOS  --   --  3.8   GFR: Estimated Creatinine Clearance: 74.1 mL/min (by C-G formula based on SCr of 0.79 mg/dL). Liver Function Tests: Recent Labs  Lab 12/18/24 1651 12/19/24 0237  AST 56* 36  ALT 26 22  ALKPHOS 125 111  BILITOT 0.6 0.5  PROT 8.5* 7.7  ALBUMIN 5.0 4.8   Recent Labs  Lab 12/18/24 1651  LIPASE 46   No results for input(s): AMMONIA in the last 168  hours. Coagulation Profile: No results for input(s): INR, PROTIME in the last 168 hours. Cardiac Enzymes: No results for input(s): CKTOTAL, CKMB, CKMBINDEX, TROPONINI in the last 168 hours. BNP (last 3 results) No results for input(s): PROBNP in the last 8760 hours. HbA1C: Recent Labs    12/18/24 1651  HGBA1C 7.1*   CBG: Recent Labs  Lab 12/20/24 0747 12/20/24 1105 12/20/24 1632 12/20/24 2144 12/21/24 0725  GLUCAP 94 134* 102* 185* 105*   Lipid Profile: No results for input(s): CHOL, HDL, LDLCALC, TRIG, CHOLHDL, LDLDIRECT in the last 72 hours. Thyroid  Function Tests: No results for input(s): TSH, T4TOTAL, FREET4, T3FREE, THYROIDAB in the last 72 hours. Anemia Panel: No results for input(s): VITAMINB12, FOLATE, FERRITIN, TIBC, IRON, RETICCTPCT in the last 72 hours. Sepsis Labs: Recent Labs  Lab 12/19/24 0237  PROCALCITON 0.17    No results found for this or any previous visit (from the past  240 hours).   Radiology Studies: No results found.   Scheduled Meds:  clonazePAM   1 mg Oral BID   enoxaparin  (LOVENOX ) injection  40 mg Subcutaneous Q24H   fluticasone  furoate-vilanterol  1 puff Inhalation Daily   insulin  aspart  0-5 Units Subcutaneous QHS   insulin  aspart  0-9 Units Subcutaneous TID WC   levothyroxine   137 mcg Oral Q0600   pantoprazole   40 mg Oral BID   polyethylene glycol  17 g Oral BID   senna-docusate  2 tablet Oral QHS   topiramate   50 mg Oral BID   Continuous Infusions:  promethazine  (PHENERGAN ) injection (IM or IVPB) Stopped (12/19/24 2148)     LOS: 2 days   Derryl Duval, MD [facility]  12/21/2024, 10:00 AM   *Please note that this is a verbal dictation therefore any spelling or grammatical errors are due to the Dragon Medical One system interpretation.  Please page via Amion and do not message via secure chat for urgent patient care matters. Secure chat can be used for non urgent  patient care matters.  How to contact the [facility] Attending or Consulting provider 7A - 7P or covering provider during after hours 7P -7A, for this patient?  Check the care team in [facility] and look for a) attending/consulting [facility] provider listed and b) the [facility] team listed. Page or secure chat 7A-7P. Log into [email or web address] and use [place]'s universal password to access. If you do not have the password, please contact the hospital operator. Locate the [facility] provider you are looking for under [facility] and page to a number that you can be directly reached. If you still have difficulty reaching the provider, please page the [facility] (Director on Call) for the Hospitalists listed on amion for assistance.  "

## 2024-12-21 NOTE — Plan of Care (Signed)

## 2024-12-21 NOTE — ED Provider Notes (Signed)
 " [facility] MEDICAL SURGICAL UNIT Provider Note   CSN: [id number] Arrival date & time: 12/18/24  1624     Patient presents with: Emesis   Melanie Shepard is a 59 y.o. female.   Patient presents with abdominal pain and vomiting.  She has a history of intractable vomiting and has been followed by GI.  The history is provided by medical records. A language interpreter was used.  Emesis Severity:  Moderate Timing:  Constant Quality:  Stomach contents Progression:  Worsening Chronicity:  New Recent urination:  Normal Context: not post-tussive   Relieved by:  None tried Worsened by:  Nothing Associated symptoms: abdominal pain   Associated symptoms: no cough, no diarrhea and no headaches        Prior to Admission medications  Medication Sig Start Date End Date Taking? Authorizing Provider  budesonide -formoterol  (SYMBICORT ) 160-4.5 MCG/ACT inhaler Inhale 2 puffs into the lungs 2 (two) times daily. 04/29/24   Pearlean Manus, MD  clonazePAM  (KLONOPIN ) 1 MG tablet Take 1 tablet by mouth 2 (two) times daily. 01/16/24   [provider]  diphenhydramine -acetaminophen  (TYLENOL  PM) 25-500 MG TABS tablet Take 1 tablet by mouth at bedtime as needed (sleep).    [provider]  doxylamine, Sleep, (UNISOM) 25 MG tablet Take 25 mg by mouth at bedtime as needed for sleep.    [provider]  Evolocumab  (REPATHA  SURECLICK) 140 MG/ML SOAJ INJECT 140mg  SUBCUTANEOUSLY EVERY TWO WEEKS AS DIRECTED    [provider]  lactulose  (CHRONULAC ) 10 GM/15ML solution Take 45 mLs (30 g total) by mouth daily as needed for mild constipation or moderate constipation. 04/29/24   Pearlean Manus, MD  melatonin 3 MG TABS tablet Take 2 tablets (6 mg total) by mouth at bedtime as needed. 05/07/24   Pearlean Manus, MD  nicotine  (NICODERM CQ  - DOSED IN MG/24 HOURS) 21 mg/24hr patch Place 1 patch (21 mg total) onto the skin daily. Patient not taking: Reported on 06/06/2024 04/30/24    Pearlean Manus, MD  ondansetron  (ZOFRAN -ODT) 8 MG disintegrating tablet Take 1 tablet (8 mg total) by mouth every 8 (eight) hours as needed for nausea or vomiting. 04/29/24   Pearlean Manus, MD  pantoprazole  (PROTONIX ) 40 MG tablet Take 1 tablet by mouth 2 (two) times daily.    [provider]  polyethylene glycol (MIRALAX  / GLYCOLAX ) 17 g packet Take 17 g by mouth 2 (two) times daily. Patient not taking: Reported on 06/06/2024 05/07/24   Pearlean Manus, MD  promethazine  (PHENERGAN ) 25 MG tablet TAKE 1/2 TO 1 TABLET (12.5mg -25mg ) BY MOUTH EVERY 4 HOURS AS NEEDED FOR NAUSEA AND/OR VOMITING 12/15/24   Kennedy Charmaine CROME, NP  senna-docusate (SENOKOT-S) 8.6-50 MG tablet Take 2 tablets by mouth at bedtime. Patient not taking: Reported on 06/06/2024 05/07/24   Pearlean Manus, MD  SYNTHROID  137 MCG tablet Take 137 mcg by mouth daily. 08/15/22   [provider]  tiZANidine  (ZANAFLEX ) 4 MG tablet Take 1 tablet (4 mg total) by mouth every 6 (six) hours as needed for muscle spasms. 04/29/24   Pearlean Manus, MD  topiramate  (TOPAMAX ) 50 MG tablet Take 1 tablet (50 mg total) by mouth 2 (two) times daily. 08/16/24   Penumalli, Vikram R, MD    Allergies: Escitalopram, Atorvastatin, Cymbalta [duloxetine hcl], Iodinated contrast media, Metoclopramide , Rosuvastatin, Trazodone  and nefazodone, and Tape    Review of Systems  Constitutional:  Negative for appetite change and fatigue.  HENT:  Negative for congestion, ear discharge and sinus pressure.   Eyes:  Negative for discharge.  Respiratory:  Negative for cough.   Cardiovascular:  Negative for chest pain.  Gastrointestinal:  Positive for abdominal pain and vomiting. Negative for diarrhea.  Genitourinary:  Negative for frequency and hematuria.  Musculoskeletal:  Negative for back pain.  Skin:  Negative for rash.  Neurological:  Negative for seizures and headaches.  Psychiatric/Behavioral:  Negative for hallucinations.     Updated Vital  Signs BP 106/64 (BP Location: Right Arm)   Pulse 74   Temp (!) 97.5 F (36.4 C) (Oral)   Resp 20   Ht 5' 7 (1.702 m)   Wt 61.2 kg   SpO2 100%   BMI 21.14 kg/m   Physical Exam Vitals and nursing note reviewed.  Constitutional:      Appearance: She is well-developed.  HENT:     Head: Normocephalic.     Nose: Nose normal.  Eyes:     General: No scleral icterus.    Conjunctiva/sclera: Conjunctivae normal.  Neck:     Thyroid : No thyromegaly.  Cardiovascular:     Rate and Rhythm: Normal rate and regular rhythm.     Heart sounds: No murmur heard.    No friction rub. No gallop.  Pulmonary:     Breath sounds: No stridor. No wheezing or rales.  Chest:     Chest wall: No tenderness.  Abdominal:     General: There is no distension.     Tenderness: There is abdominal tenderness. There is no rebound.  Musculoskeletal:        General: Normal range of motion.     Cervical back: Neck supple.  Lymphadenopathy:     Cervical: No cervical adenopathy.  Skin:    Findings: No erythema or rash.  Neurological:     Mental Status: She is oriented to person, place, and time.     Motor: No abnormal muscle tone.     Coordination: Coordination normal.  Psychiatric:        Behavior: Behavior normal.     (all labs ordered are listed, but only abnormal results are displayed) Labs Reviewed  COMPREHENSIVE METABOLIC PANEL WITH GFR - Abnormal; Notable for the following components:      Result Value   Sodium 131 (*)    Chloride 93 (*)    CO2 16 (*)    Glucose, Bld 178 (*)    Total Protein 8.5 (*)    AST 56 (*)    Anion gap 21 (*)    All other components within normal limits  CBC - Abnormal; Notable for the following components:   WBC 16.2 (*)    MCV 100.5 (*)    All other components within normal limits  BASIC METABOLIC PANEL WITH GFR - Abnormal; Notable for the following components:   Sodium 133 (*)    Chloride 96 (*)    Glucose, Bld 152 (*)    All other components within normal  limits  HEMOGLOBIN A1C - Abnormal; Notable for the following components:   Hgb A1c MFr Bld 7.1 (*)    All other components within normal limits  CBC WITH DIFFERENTIAL/PLATELET - Abnormal; Notable for the following components:   WBC 12.9 (*)    Neutro Abs 8.6 (*)    Abs Immature Granulocytes 0.08 (*)    All other components within normal limits  COMPREHENSIVE METABOLIC PANEL WITH GFR - Abnormal; Notable for the following components:   Sodium 134 (*)    Chloride 96 (*)    Glucose, Bld 155 (*)  All other components within normal limits  GLUCOSE, CAPILLARY - Abnormal; Notable for the following components:   Glucose-Capillary 123 (*)    All other components within normal limits  GLUCOSE, CAPILLARY - Abnormal; Notable for the following components:   Glucose-Capillary 122 (*)    All other components within normal limits  GLUCOSE, CAPILLARY - Abnormal; Notable for the following components:   Glucose-Capillary 134 (*)    All other components within normal limits  GLUCOSE, CAPILLARY - Abnormal; Notable for the following components:   Glucose-Capillary 102 (*)    All other components within normal limits  GLUCOSE, CAPILLARY - Abnormal; Notable for the following components:   Glucose-Capillary 185 (*)    All other components within normal limits  GLUCOSE, CAPILLARY - Abnormal; Notable for the following components:   Glucose-Capillary 105 (*)    All other components within normal limits  CBG MONITORING, ED - Abnormal; Notable for the following components:   Glucose-Capillary 110 (*)    All other components within normal limits  CBG MONITORING, ED - Abnormal; Notable for the following components:   Glucose-Capillary 106 (*)    All other components within normal limits  LIPASE, BLOOD  URINALYSIS, ROUTINE W REFLEX MICROSCOPIC  MAGNESIUM   BETA-HYDROXYBUTYRIC ACID  PHOSPHORUS  PROCALCITONIN  GLUCOSE, CAPILLARY  TROPONIN T, HIGH SENSITIVITY  TROPONIN T, HIGH SENSITIVITY    EKG: EKG  Interpretation Date/Time:  Thursday December 18 2024 16:36:56 EST Ventricular Rate:  96 PR Interval:  148 QRS Duration:  74 QT Interval:  370 QTC Calculation: 467 R Axis:   71  Text Interpretation: Normal sinus rhythm Biatrial enlargement ST & T wave abnormality, consider inferior ischemia ST & T wave abnormality, consider anterior ischemia Prolonged QT Abnormal ECG When compared with ECG of 27-Apr-2024 19:25, T wave inversion now evident in Lateral leads No significant change since last tracing Confirmed by Dasie Faden ([id number]) on 12/19/2024 10:09:14 AM  Radiology: No results found.   Procedures   Medications Ordered in the ED  promethazine  (PHENERGAN ) 25 mg in sodium chloride  0.9 % 50 mL IVPB (0 mg Intravenous Stopped 12/19/24 2148)  levothyroxine  (SYNTHROID ) tablet 137 mcg (137 mcg Oral Given 12/21/24 0514)  tiZANidine  (ZANAFLEX ) tablet 4 mg (has no administration in time range)  pantoprazole  (PROTONIX ) EC tablet 40 mg (40 mg Oral Given 12/21/24 0802)  clonazePAM  (KLONOPIN ) tablet 1 mg (1 mg Oral Given 12/21/24 0802)  ondansetron  (ZOFRAN -ODT) disintegrating tablet 8 mg (8 mg Oral Given 12/19/24 0220)  senna-docusate (Senokot-S) tablet 2 tablet (2 tablets Oral Not Given 12/20/24 2108)  polyethylene glycol (MIRALAX  / GLYCOLAX ) packet 17 g (17 g Oral Patient Refused/Not Given 12/21/24 0803)  fluticasone  furoate-vilanterol (BREO ELLIPTA ) 200-25 MCG/ACT 1 puff (1 puff Inhalation Given 12/20/24 1135)  0.9 %  sodium chloride  infusion ( Intravenous Infusion Verify 12/19/24 1628)  topiramate  (TOPAMAX ) tablet 50 mg (50 mg Oral Patient Refused/Not Given 12/21/24 0837)  enoxaparin  (LOVENOX ) injection 40 mg (40 mg Subcutaneous Given 12/21/24 0802)  insulin  aspart (novoLOG ) injection 0-9 Units ( Subcutaneous Not Given 12/21/24 0747)  insulin  aspart (novoLOG ) injection 0-5 Units ( Subcutaneous Not Given 12/20/24 2107)  acetaminophen  (TYLENOL ) tablet 650 mg (650 mg Oral Given 12/20/24 0130)   HYDROcodone -acetaminophen  (NORCO/VICODIN) 5-325 MG per tablet 1 tablet (1 tablet Oral Given 12/21/24 1010)  ondansetron  (ZOFRAN ) injection 4 mg (4 mg Intravenous Given 12/18/24 1751)  HYDROmorphone  (DILAUDID ) injection 1 mg (1 mg Intravenous Given 12/18/24 1834)  LORazepam  (ATIVAN ) injection 0.5 mg (0.5 mg Intravenous Given 12/18/24 1833)  sodium  chloride 0.9 % bolus 1,000 mL (0 mLs Intravenous Stopped 12/18/24 2137)  pantoprazole  (PROTONIX ) injection 40 mg (40 mg Intravenous Given 12/18/24 1833)  HYDROmorphone  (DILAUDID ) injection 1 mg (1 mg Intravenous Given 12/18/24 1917)  LORazepam  (ATIVAN ) injection 0.5 mg (0.5 mg Intravenous Given 12/18/24 1917)  ondansetron  (ZOFRAN ) injection 4 mg (4 mg Intravenous Given 12/18/24 1956)  prochlorperazine  (COMPAZINE ) injection 10 mg (10 mg Intravenous Given 12/18/24 2103)  HYDROmorphone  (DILAUDID ) injection 1 mg (1 mg Intravenous Given 12/18/24 2137)                                    Medical Decision Making Amount and/or Complexity of Data Reviewed Labs: ordered. Radiology: ordered.  Risk Prescription drug management. Decision regarding hospitalization.   With intractable vomiting.  She will be admitted to medicine with GI consult     Final diagnoses:  Intractable vomiting    ED Discharge Orders     None          Suzette Pac, MD 12/21/24 1128  "

## 2024-12-21 NOTE — Discharge Summary (Addendum)
 Physician Discharge Summary  Kerisha Roland Patnode FMW:[id number] DOB: 1966-02-26 DOA: 12/18/2024  PCP: [facility]  Admit date: 12/18/2024 Discharge date: 12/21/2024  Admitted From: Home Disposition: Home  Recommendations for Outpatient Follow-up:  Follow up with PCP in 1 week with repeat CBC/BMP Follow up in ED if symptoms worsen or new appear   Home Health: No Equipment/Devices: None  Discharge Condition: Stable CODE STATUS: Full Diet recommendation: Heart healthy  Brief/Interim Summary:  Tashena Dimitri is a 59 y.o. female with medical history significant of HTN, diabetes mellitus, nonalcoholic liver disease, emphysema, seizure, anxiety, migraines, intractable nausea and vomiting with abdominal pain and headache who presents to the emergency department due to multiple episodes (about 10 times) of vomiting at home.  Patient has had several episodes of same symptoms over the past years and has had both inpatient and outpatient GI workout.  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   In the ED, she was hemodynamically stable, CBC was significant for WBC of 16.2 and MCV of 100.5.  BMP showed sodium 131, chloride 93, bicarb 16, blood glucose 178.   Assessment & Plan:     Intractable nausea and vomiting with abdominal pain: This is a recurrent issue.  CT abdomen negative for acute findings.  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  Continue to treat symptomatically. - Patient able to tolerate diet and discharged home.  Dehydration/metabolic acidosis/leukocytosis: This resolved with hydration.  GERD: She has longstanding history of GERD, consumes at least 3 cans of sodas and 3 cans of Gatorade every day.  Also smokes.  Does not consume alcohol.  Takes omeprazole 20 mg twice daily at home.  Continue.  Hypothyroidism: Continue Synthroid .    Headache: Resume Topamax  scheduled and as needed ibuprofen .   Elevated blood pressure: No history of hypertension.  Blood pressure was elevated, patient was started on lisinopril  2.5 mg.  Blood pressure was likely elevated due to nausea and vomiting.  I have discontinued lisinopril .  Blood pressure is within normal range now.   Hepatomegaly with diffuse extensive steatosis/hyperlipidemia: On Repatha .   Mild emphysema: Continue Symbicort .  Stable.   Type 2 diabetes mellitus: Came in with hyperglycemia, hemoglobin A1c 7.1.  Previously hemoglobin A1c had been ranging anywhere from 5.8-7.0.  Not on any medications PTA.  Continue to follow outpatient with PCP  Discharge Diagnoses:  Principal Problem:   Intractable nausea and vomiting Active Problems:   Pulmonary emphysema (HCC)   Generalized anxiety disorder   Acquired hypothyroidism   Mixed hyperlipidemia   Type 2 diabetes mellitus without complications [facility])    Discharge Instructions  Discharge Instructions     Call MD for:  persistant nausea and vomiting   Complete by: As directed    Call MD for:  severe uncontrolled pain   Complete by: As directed    Diet general   Complete by: As directed    Discharge instructions   Complete by: As directed    1. F/u with PCP in a week   Increase activity slowly   Complete by: As directed       Allergies as of 12/21/2024       Reactions   Escitalopram Other (See Comments)   unknown   Atorvastatin Other (See Comments)   unknown   Cymbalta [duloxetine Hcl] Other (See Comments)   Makes my head do crazy  things   Iodinated Contrast Media Swelling, Other (See Comments)   Facial swelling, pt tried premedication previously still had a reaction.   Metoclopramide  Nausea And Vomiting   Rosuvastatin Other (See Comments)   unknown   Trazodone  And Nefazodone    Gives nightmares   Tape Rash, Swelling, Other (See Comments)   Tolerates paper tape        Medication List     TAKE these  medications    budesonide -formoterol  160-4.5 MCG/ACT inhaler Commonly known as: SYMBICORT  Inhale 2 puffs into the lungs 2 (two) times daily.   clonazePAM  1 MG tablet Commonly known as: KLONOPIN  Take 1 tablet by mouth 2 (two) times daily.   diphenhydramine -acetaminophen  25-500 MG Tabs tablet Commonly known as: TYLENOL  PM Take 1 tablet by mouth at bedtime as needed (sleep).   doxylamine (Sleep) 25 MG tablet Commonly known as: UNISOM Take 25 mg by mouth at bedtime as needed for sleep.   lactulose  10 GM/15ML solution Commonly known as: CHRONULAC  Take 45 mLs (30 g total) by mouth daily as needed for mild constipation or moderate constipation.   melatonin 3 MG Tabs tablet Take 2 tablets (6 mg total) by mouth at bedtime as needed.   nicotine  21 mg/24hr patch Commonly known as: NICODERM CQ  - dosed in mg/24 hours Place 1 patch (21 mg total) onto the skin daily.   ondansetron  8 MG disintegrating tablet Commonly known as: ZOFRAN -ODT Take 1 tablet (8 mg total) by mouth every 8 (eight) hours as needed for nausea or vomiting.   pantoprazole  40 MG tablet Commonly known as: PROTONIX  Take 1 tablet by mouth 2 (two) times daily.   polyethylene glycol 17 g packet Commonly known as: MIRALAX  / GLYCOLAX  Take 17 g by mouth 2 (two) times daily.   promethazine  25 MG tablet Commonly known as: PHENERGAN  TAKE 1/2 TO 1 TABLET (12.5mg -25mg ) BY MOUTH EVERY 4 HOURS AS NEEDED FOR NAUSEA AND/OR VOMITING   Repatha  SureClick 140 MG/ML Soaj Generic drug: Evolocumab  INJECT 140mg  SUBCUTANEOUSLY EVERY TWO WEEKS AS DIRECTED   senna-docusate 8.6-50 MG tablet Commonly known as: Senokot-S Take 2 tablets by mouth at bedtime.   Synthroid  137 MCG tablet Generic drug: levothyroxine  Take 137 mcg by mouth daily.   tiZANidine  4 MG tablet Commonly known as: ZANAFLEX  Take 1 tablet (4 mg total) by mouth every 6 (six) hours as needed for muscle spasms.   topiramate  50 MG tablet Commonly known as: TOPAMAX  Take  1 tablet (50 mg total) by mouth 2 (two) times daily.        Follow-up Information     [facility]. Schedule an appointment as soon as possible for a visit in 1 week(s).   Contact information: [address] [place] [address] [phone]                Allergies[1]  Consultations:    Procedures/Studies: CT ABDOMEN PELVIS WO CONTRAST Result Date: 12/18/2024 EXAM: CT ABDOMEN AND PELVIS WITHOUT CONTRAST 12/18/2024 08:23:44 PM TECHNIQUE: CT of the abdomen and pelvis was performed without the administration of intravenous contrast. Multiplanar reformatted images are provided for review. Automated exposure control, iterative reconstruction, and/or weight-based adjustment of the mA/kV was utilized to reduce the radiation dose to as low as reasonably achievable. COMPARISON: CT abdomen and pelvis 08/23/2024. CLINICAL HISTORY: Abdominal pain, acute, nonlocalized. FINDINGS: LOWER CHEST: No acute abnormality. LIVER: The liver is enlarged. There is diffuse fatty infiltration of the liver. GALLBLADDER AND BILE DUCTS: Gallbladder is surgically absent. No biliary ductal dilatation.  SPLEEN: No acute abnormality. PANCREAS: No acute abnormality. ADRENAL GLANDS: No acute abnormality. KIDNEYS, URETERS AND BLADDER: No stones in the kidneys or ureters. No hydronephrosis. No perinephric or periureteral stranding. Urinary bladder is unremarkable. GI AND BOWEL: Stomach demonstrates no acute abnormality. The appendix is not definitely visualized. There is no bowel obstruction. PERITONEUM AND RETROPERITONEUM: No ascites. No free air. VASCULATURE: Aorta is normal in caliber. There are atherosclerotic calcifications throughout the aorta and iliac arteries. LYMPH NODES: No lymphadenopathy. REPRODUCTIVE ORGANS: The uterus is surgically absent. BONES AND SOFT TISSUES: No acute osseous abnormality. No focal soft tissue abnormality. IMPRESSION: 1. No acute findings in the abdomen or pelvis. 2.  Hepatomegaly with diffuse hepatic steatosis. Electronically signed by: Greig Pique MD MD 12/18/2024 08:31 PM EST RP Workstation: [id number]      Subjective:   Discharge Exam: Vitals:   12/21/24 0612 12/21/24 1347  BP: 106/64 122/71  Pulse: 74 80  Resp: 20   Temp: (!) 97.5 F (36.4 C) 97.8 F (36.6 C)  SpO2: 100% 97%    General exam: Appears calm and comfortable  Respiratory system: Clear to auscultation. Respiratory effort normal. Cardiovascular system: S1 & S2 heard, RRR. No JVD, murmurs, rubs, gallops or clicks. No pedal edema. Gastrointestinal system: Abdomen is nondistended, soft, mild lower abdominal tenderness. No organomegaly or masses felt. Normal bowel sounds heard. Central nervous system: Alert and oriented. No focal neurological deficits. Extremities: Symmetric 5 x 5 power. Skin: No rashes, lesions or ulcers Psychiatry: Judgement and insight appear normal. Mood & affect appropriate.    The results of significant diagnostics from this hospitalization (including imaging, microbiology, ancillary and laboratory) are listed below for reference.     Microbiology: No results found for this or any previous visit (from the past 240 hours).   Labs: BNP (last 3 results) No results for input(s): BNP in the last 8760 hours. Basic Metabolic Panel: No results for input(s): NA, K, CL, CO2, GLUCOSE, BUN, CREATININE, CALCIUM, MG, PHOS in the last 168 hours.  Liver Function Tests: No results for input(s): AST, ALT, ALKPHOS, BILITOT, PROT, ALBUMIN in the last 168 hours.  No results for input(s): LIPASE, AMYLASE in the last 168 hours.  No results for input(s): AMMONIA in the last 168 hours. CBC: No results for input(s): WBC, NEUTROABS, HGB, HCT, MCV, PLT in the last 168 hours.  Cardiac Enzymes: No results for input(s): CKTOTAL, CKMB, CKMBINDEX, TROPONINI in the last 168 hours. BNP: Invalid input(s):  POCBNP CBG: No results for input(s): GLUCAP in the last 168 hours.  D-Dimer No results for input(s): DDIMER in the last 72 hours. Hgb A1c No results for input(s): HGBA1C in the last 72 hours.  Lipid Profile No results for input(s): CHOL, HDL, LDLCALC, TRIG, CHOLHDL, LDLDIRECT in the last 72 hours. Thyroid  function studies No results for input(s): TSH, T4TOTAL, T3FREE, THYROIDAB in the last 72 hours.  Invalid input(s): FREET3 Anemia work up No results for input(s): VITAMINB12, FOLATE, FERRITIN, TIBC, IRON, RETICCTPCT in the last 72 hours. Urinalysis    Component Value Date/Time   COLORURINE YELLOW 12/18/2024 2133   APPEARANCEUR CLEAR 12/18/2024 2133   LABSPEC 1.015 12/18/2024 2133   PHURINE 6.0 12/18/2024 2133   GLUCOSEU NEGATIVE 12/18/2024 2133   HGBUR NEGATIVE 12/18/2024 2133   BILIRUBINUR NEGATIVE 12/18/2024 2133   BILIRUBINUR positive 09/09/2013 1513   KETONESUR NEGATIVE 12/18/2024 2133   PROTEINUR NEGATIVE 12/18/2024 2133   UROBILINOGEN 0.2 09/11/2013 1946   NITRITE NEGATIVE 12/18/2024 2133   LEUKOCYTESUR NEGATIVE 12/18/2024 2133  Sepsis Labs No results for input(s): WBC in the last 168 hours.  Invalid input(s): PROCALCITONIN, LACTICIDVEN  Microbiology No results found for this or any previous visit (from the past 240 hours).   Time coordinating discharge: 35 minutes  SIGNED:   Derryl Duval, MD  [facility] 01/05/2025, 4:10 PM       [1]  Allergies Allergen Reactions   Escitalopram Other (See Comments)    unknown   Atorvastatin Other (See Comments)    unknown   Cymbalta [Duloxetine Hcl] Other (See Comments)    Makes my head do crazy things   Iodinated Contrast Media Swelling and Other (See Comments)    Facial swelling, pt tried premedication previously still had a reaction.   Metoclopramide  Nausea And Vomiting   Rosuvastatin Other (See Comments)    unknown   Trazodone  And Nefazodone      Gives nightmares   Tape Rash, Swelling and Other (See Comments)    Tolerates paper tape

## 2025-02-09 ENCOUNTER — Telehealth: Admitting: Diagnostic Neuroimaging
